# Patient Record
Sex: Female | Born: 1945
Health system: Southern US, Community
[De-identification: ages and names within clinical notes are randomized; demographics above are authoritative.]

## PROBLEM LIST (undated history)

## (undated) ENCOUNTER — Emergency Department (HOSPITAL_COMMUNITY): Admission: EM | Disposition: A | Discharge status: Home / Self Care | Payer: Self-pay

## (undated) DIAGNOSIS — E079 Disorder of thyroid, unspecified: Secondary | ICD-10-CM

## (undated) DIAGNOSIS — I10 Essential (primary) hypertension: Secondary | ICD-10-CM

## (undated) DIAGNOSIS — R011 Cardiac murmur, unspecified: Secondary | ICD-10-CM

## (undated) DIAGNOSIS — C801 Malignant (primary) neoplasm, unspecified: Secondary | ICD-10-CM

## (undated) DIAGNOSIS — E039 Hypothyroidism, unspecified: Secondary | ICD-10-CM

## (undated) DIAGNOSIS — J449 Chronic obstructive pulmonary disease, unspecified: Secondary | ICD-10-CM

## (undated) DIAGNOSIS — F419 Anxiety disorder, unspecified: Secondary | ICD-10-CM

## (undated) DIAGNOSIS — Z8742 Personal history of other diseases of the female genital tract: Secondary | ICD-10-CM

## (undated) DIAGNOSIS — F32A Depression, unspecified: Secondary | ICD-10-CM

## (undated) DIAGNOSIS — K279 Peptic ulcer, site unspecified, unspecified as acute or chronic, without hemorrhage or perforation: Secondary | ICD-10-CM

## (undated) DIAGNOSIS — F429 Obsessive-compulsive disorder, unspecified: Secondary | ICD-10-CM

## (undated) DIAGNOSIS — K219 Gastro-esophageal reflux disease without esophagitis: Secondary | ICD-10-CM

## (undated) DIAGNOSIS — M810 Age-related osteoporosis without current pathological fracture: Secondary | ICD-10-CM

## (undated) DIAGNOSIS — K5730 Diverticulosis of large intestine without perforation or abscess without bleeding: Secondary | ICD-10-CM

## (undated) DIAGNOSIS — M1990 Unspecified osteoarthritis, unspecified site: Secondary | ICD-10-CM

## (undated) DIAGNOSIS — F329 Major depressive disorder, single episode, unspecified: Secondary | ICD-10-CM

## (undated) HISTORY — DX: Disorder of thyroid, unspecified: E07.9

## (undated) HISTORY — DX: Gastro-esophageal reflux disease without esophagitis: K21.9

## (undated) HISTORY — DX: Obsessive-compulsive disorder, unspecified: F42.9

## (undated) HISTORY — PX: EYE SURGERY: SHX253

## (undated) HISTORY — PX: ABDOMINAL HYSTERECTOMY: SHX81

## (undated) HISTORY — DX: Anxiety disorder, unspecified: F41.9

## (undated) HISTORY — PX: BLADDER SUSPENSION: SHX72

## (undated) HISTORY — DX: Personal history of other diseases of the female genital tract: Z87.42

## (undated) HISTORY — DX: Major depressive disorder, single episode, unspecified: F32.9

## (undated) HISTORY — DX: Chronic obstructive pulmonary disease, unspecified: J44.9

## (undated) HISTORY — DX: Diverticulosis of large intestine without perforation or abscess without bleeding: K57.30

## (undated) HISTORY — DX: Depression, unspecified: F32.A

## (undated) HISTORY — PX: CATARACT EXTRACTION: SUR2

## (undated) HISTORY — DX: Peptic ulcer, site unspecified, unspecified as acute or chronic, without hemorrhage or perforation: K27.9

---

## 1997-09-19 ENCOUNTER — Encounter: Admission: RE | Admit: 1997-09-19 | Discharge: 1997-09-19 | Payer: Self-pay | Admitting: Internal Medicine

## 1998-03-11 ENCOUNTER — Encounter: Admission: RE | Admit: 1998-03-11 | Discharge: 1998-03-11 | Payer: Self-pay | Admitting: Internal Medicine

## 1998-03-22 ENCOUNTER — Emergency Department (HOSPITAL_COMMUNITY): Admission: EM | Admit: 1998-03-22 | Discharge: 1998-03-22 | Payer: Self-pay | Admitting: Emergency Medicine

## 1998-05-14 ENCOUNTER — Encounter: Payer: Self-pay | Admitting: Emergency Medicine

## 1998-05-14 ENCOUNTER — Emergency Department (HOSPITAL_COMMUNITY): Admission: EM | Admit: 1998-05-14 | Discharge: 1998-05-14 | Payer: Self-pay | Admitting: Emergency Medicine

## 1998-05-15 ENCOUNTER — Emergency Department (HOSPITAL_COMMUNITY): Admission: EM | Admit: 1998-05-15 | Discharge: 1998-05-15 | Payer: Self-pay | Admitting: Emergency Medicine

## 1998-05-15 ENCOUNTER — Encounter: Admission: RE | Admit: 1998-05-15 | Discharge: 1998-05-15 | Payer: Self-pay | Admitting: Internal Medicine

## 1998-05-22 ENCOUNTER — Encounter: Admission: RE | Admit: 1998-05-22 | Discharge: 1998-05-22 | Payer: Self-pay | Admitting: Hematology and Oncology

## 1998-05-22 ENCOUNTER — Ambulatory Visit (HOSPITAL_COMMUNITY): Admission: RE | Admit: 1998-05-22 | Discharge: 1998-05-22 | Payer: Self-pay | Admitting: Hematology and Oncology

## 1998-06-04 ENCOUNTER — Encounter: Admission: RE | Admit: 1998-06-04 | Discharge: 1998-09-02 | Payer: Self-pay | Admitting: Family Medicine

## 1998-06-13 ENCOUNTER — Encounter: Admission: RE | Admit: 1998-06-13 | Discharge: 1998-06-13 | Payer: Self-pay | Admitting: Internal Medicine

## 1998-06-19 ENCOUNTER — Encounter: Admission: RE | Admit: 1998-06-19 | Discharge: 1998-06-19 | Payer: Self-pay | Admitting: Hematology and Oncology

## 1998-06-23 ENCOUNTER — Encounter: Admission: RE | Admit: 1998-06-23 | Discharge: 1998-07-31 | Payer: Self-pay

## 1998-06-24 ENCOUNTER — Ambulatory Visit (HOSPITAL_COMMUNITY): Admission: RE | Admit: 1998-06-24 | Discharge: 1998-06-24 | Payer: Self-pay | Admitting: *Deleted

## 1998-06-27 ENCOUNTER — Encounter: Admission: RE | Admit: 1998-06-27 | Discharge: 1998-06-27 | Payer: Self-pay | Admitting: Internal Medicine

## 1998-07-14 ENCOUNTER — Inpatient Hospital Stay (HOSPITAL_COMMUNITY): Admission: EM | Admit: 1998-07-14 | Discharge: 1998-07-14 | Payer: Self-pay | Admitting: Obstetrics & Gynecology

## 1998-07-24 ENCOUNTER — Other Ambulatory Visit: Admission: RE | Admit: 1998-07-24 | Discharge: 1998-07-24 | Payer: Self-pay | Admitting: Obstetrics

## 1998-07-24 ENCOUNTER — Encounter: Admission: RE | Admit: 1998-07-24 | Discharge: 1998-07-24 | Payer: Self-pay | Admitting: Obstetrics

## 1998-09-04 ENCOUNTER — Emergency Department (HOSPITAL_COMMUNITY): Admission: EM | Admit: 1998-09-04 | Discharge: 1998-09-04 | Payer: Self-pay | Admitting: Emergency Medicine

## 1998-09-16 ENCOUNTER — Encounter: Admission: RE | Admit: 1998-09-16 | Discharge: 1998-09-16 | Payer: Self-pay | Admitting: Hematology and Oncology

## 1998-11-03 ENCOUNTER — Emergency Department (HOSPITAL_COMMUNITY): Admission: EM | Admit: 1998-11-03 | Discharge: 1998-11-03 | Payer: Self-pay | Admitting: Emergency Medicine

## 1998-11-07 ENCOUNTER — Ambulatory Visit (HOSPITAL_COMMUNITY): Admission: RE | Admit: 1998-11-07 | Discharge: 1998-11-07 | Payer: Self-pay | Admitting: Family Medicine

## 1998-11-07 ENCOUNTER — Encounter: Payer: Self-pay | Admitting: *Deleted

## 1998-11-08 ENCOUNTER — Emergency Department (HOSPITAL_COMMUNITY): Admission: EM | Admit: 1998-11-08 | Discharge: 1998-11-08 | Payer: Self-pay | Admitting: Emergency Medicine

## 1998-11-09 ENCOUNTER — Emergency Department (HOSPITAL_COMMUNITY): Admission: EM | Admit: 1998-11-09 | Discharge: 1998-11-10 | Payer: Self-pay | Admitting: Emergency Medicine

## 1998-11-13 ENCOUNTER — Encounter (HOSPITAL_COMMUNITY): Admission: RE | Admit: 1998-11-13 | Discharge: 1998-11-17 | Payer: Self-pay | Admitting: Emergency Medicine

## 1998-11-24 ENCOUNTER — Encounter: Admission: RE | Admit: 1998-11-24 | Discharge: 1998-12-09 | Payer: Self-pay | Admitting: Emergency Medicine

## 1999-02-27 ENCOUNTER — Emergency Department (HOSPITAL_COMMUNITY): Admission: EM | Admit: 1999-02-27 | Discharge: 1999-02-27 | Payer: Self-pay | Admitting: Internal Medicine

## 1999-03-02 ENCOUNTER — Emergency Department (HOSPITAL_COMMUNITY): Admission: EM | Admit: 1999-03-02 | Discharge: 1999-03-02 | Payer: Self-pay | Admitting: Emergency Medicine

## 1999-04-13 ENCOUNTER — Emergency Department (HOSPITAL_COMMUNITY): Admission: EM | Admit: 1999-04-13 | Discharge: 1999-04-13 | Payer: Self-pay | Admitting: Emergency Medicine

## 1999-04-16 ENCOUNTER — Encounter: Admission: RE | Admit: 1999-04-16 | Discharge: 1999-04-16 | Payer: Self-pay | Admitting: Hematology and Oncology

## 1999-08-10 ENCOUNTER — Encounter: Admission: RE | Admit: 1999-08-10 | Discharge: 1999-08-10 | Payer: Self-pay | Admitting: Internal Medicine

## 1999-09-29 ENCOUNTER — Encounter: Admission: RE | Admit: 1999-09-29 | Discharge: 1999-09-29 | Payer: Self-pay | Admitting: Family Medicine

## 1999-09-29 ENCOUNTER — Encounter: Payer: Self-pay | Admitting: Family Medicine

## 1999-12-13 ENCOUNTER — Emergency Department (HOSPITAL_COMMUNITY): Admission: EM | Admit: 1999-12-13 | Discharge: 1999-12-13 | Payer: Self-pay | Admitting: Emergency Medicine

## 2000-01-22 ENCOUNTER — Other Ambulatory Visit: Admission: RE | Admit: 2000-01-22 | Discharge: 2000-01-22 | Payer: Self-pay | Admitting: Internal Medicine

## 2000-07-05 ENCOUNTER — Emergency Department (HOSPITAL_COMMUNITY): Admission: EM | Admit: 2000-07-05 | Discharge: 2000-07-05 | Payer: Self-pay | Admitting: Emergency Medicine

## 2000-08-04 ENCOUNTER — Emergency Department (HOSPITAL_COMMUNITY): Admission: EM | Admit: 2000-08-04 | Discharge: 2000-08-04 | Payer: Self-pay | Admitting: Internal Medicine

## 2000-10-01 ENCOUNTER — Emergency Department (HOSPITAL_COMMUNITY): Admission: EM | Admit: 2000-10-01 | Discharge: 2000-10-01 | Payer: Self-pay | Admitting: Emergency Medicine

## 2001-02-27 ENCOUNTER — Ambulatory Visit (HOSPITAL_COMMUNITY): Admission: RE | Admit: 2001-02-27 | Discharge: 2001-02-27 | Payer: Self-pay | Admitting: Internal Medicine

## 2001-02-27 ENCOUNTER — Encounter: Payer: Self-pay | Admitting: Internal Medicine

## 2001-10-13 ENCOUNTER — Encounter: Admission: RE | Admit: 2001-10-13 | Discharge: 2001-10-13 | Payer: Self-pay | Admitting: Internal Medicine

## 2001-10-13 ENCOUNTER — Encounter: Payer: Self-pay | Admitting: Internal Medicine

## 2001-11-01 ENCOUNTER — Encounter: Payer: Self-pay | Admitting: Internal Medicine

## 2001-11-01 ENCOUNTER — Encounter: Admission: RE | Admit: 2001-11-01 | Discharge: 2001-11-01 | Payer: Self-pay | Admitting: Internal Medicine

## 2002-04-16 ENCOUNTER — Emergency Department (HOSPITAL_COMMUNITY): Admission: EM | Admit: 2002-04-16 | Discharge: 2002-04-16 | Payer: Self-pay | Admitting: Emergency Medicine

## 2002-04-23 ENCOUNTER — Other Ambulatory Visit: Admission: RE | Admit: 2002-04-23 | Discharge: 2002-04-23 | Payer: Self-pay | Admitting: Obstetrics and Gynecology

## 2002-04-26 ENCOUNTER — Encounter: Admission: RE | Admit: 2002-04-26 | Discharge: 2002-07-25 | Payer: Self-pay | Admitting: Family Medicine

## 2002-11-27 ENCOUNTER — Encounter: Payer: Self-pay | Admitting: Obstetrics and Gynecology

## 2002-11-27 ENCOUNTER — Encounter: Admission: RE | Admit: 2002-11-27 | Discharge: 2002-11-27 | Payer: Self-pay | Admitting: Obstetrics and Gynecology

## 2002-11-28 ENCOUNTER — Encounter: Payer: Self-pay | Admitting: Obstetrics and Gynecology

## 2002-11-28 ENCOUNTER — Ambulatory Visit (HOSPITAL_COMMUNITY): Admission: RE | Admit: 2002-11-28 | Discharge: 2002-11-28 | Payer: Self-pay | Admitting: Obstetrics and Gynecology

## 2003-01-16 ENCOUNTER — Encounter (INDEPENDENT_AMBULATORY_CARE_PROVIDER_SITE_OTHER): Payer: Self-pay | Admitting: Specialist

## 2003-01-16 ENCOUNTER — Ambulatory Visit (HOSPITAL_COMMUNITY): Admission: RE | Admit: 2003-01-16 | Discharge: 2003-01-16 | Payer: Self-pay | Admitting: Gastroenterology

## 2003-07-26 ENCOUNTER — Inpatient Hospital Stay (HOSPITAL_COMMUNITY): Admission: RE | Admit: 2003-07-26 | Discharge: 2003-07-29 | Payer: Self-pay | Admitting: Psychiatry

## 2003-07-26 ENCOUNTER — Emergency Department (HOSPITAL_COMMUNITY): Admission: EM | Admit: 2003-07-26 | Discharge: 2003-07-26 | Payer: Self-pay | Admitting: Emergency Medicine

## 2003-08-04 ENCOUNTER — Emergency Department (HOSPITAL_COMMUNITY): Admission: EM | Admit: 2003-08-04 | Discharge: 2003-08-04 | Payer: Self-pay | Admitting: *Deleted

## 2003-08-17 ENCOUNTER — Emergency Department (HOSPITAL_COMMUNITY): Admission: EM | Admit: 2003-08-17 | Discharge: 2003-08-17 | Payer: Self-pay | Admitting: Emergency Medicine

## 2003-11-06 ENCOUNTER — Ambulatory Visit (HOSPITAL_COMMUNITY): Admission: RE | Admit: 2003-11-06 | Discharge: 2003-11-06 | Payer: Self-pay | Admitting: Ophthalmology

## 2003-11-13 ENCOUNTER — Encounter: Admission: RE | Admit: 2003-11-13 | Discharge: 2003-11-13 | Payer: Self-pay | Admitting: Family Medicine

## 2003-12-21 ENCOUNTER — Emergency Department (HOSPITAL_COMMUNITY): Admission: EM | Admit: 2003-12-21 | Discharge: 2003-12-21 | Payer: Self-pay | Admitting: Family Medicine

## 2004-01-12 ENCOUNTER — Emergency Department (HOSPITAL_COMMUNITY): Admission: EM | Admit: 2004-01-12 | Discharge: 2004-01-12 | Payer: Self-pay | Admitting: *Deleted

## 2004-01-21 ENCOUNTER — Ambulatory Visit (HOSPITAL_COMMUNITY): Admission: RE | Admit: 2004-01-21 | Discharge: 2004-01-21 | Payer: Self-pay | Admitting: Dentistry

## 2004-01-21 ENCOUNTER — Encounter (INDEPENDENT_AMBULATORY_CARE_PROVIDER_SITE_OTHER): Payer: Self-pay | Admitting: Specialist

## 2004-02-08 ENCOUNTER — Emergency Department (HOSPITAL_COMMUNITY): Admission: EM | Admit: 2004-02-08 | Discharge: 2004-02-08 | Payer: Self-pay | Admitting: Emergency Medicine

## 2004-02-09 ENCOUNTER — Emergency Department (HOSPITAL_COMMUNITY): Admission: EM | Admit: 2004-02-09 | Discharge: 2004-02-09 | Payer: Self-pay | Admitting: Emergency Medicine

## 2004-08-01 ENCOUNTER — Emergency Department (HOSPITAL_COMMUNITY): Admission: EM | Admit: 2004-08-01 | Discharge: 2004-08-01 | Payer: Self-pay | Admitting: Emergency Medicine

## 2004-09-16 ENCOUNTER — Ambulatory Visit: Payer: Self-pay

## 2004-09-17 ENCOUNTER — Encounter: Admission: RE | Admit: 2004-09-17 | Discharge: 2004-09-17 | Payer: Self-pay | Admitting: Sports Medicine

## 2004-09-17 ENCOUNTER — Encounter: Payer: Self-pay | Admitting: Family Medicine

## 2004-10-03 DIAGNOSIS — Z8742 Personal history of other diseases of the female genital tract: Secondary | ICD-10-CM

## 2004-10-03 HISTORY — PX: TONSILLECTOMY AND ADENOIDECTOMY: SUR1326

## 2004-10-03 HISTORY — DX: Personal history of other diseases of the female genital tract: Z87.42

## 2004-10-22 ENCOUNTER — Ambulatory Visit: Payer: Self-pay | Admitting: Family Medicine

## 2004-10-30 ENCOUNTER — Ambulatory Visit: Payer: Self-pay | Admitting: Family Medicine

## 2005-01-21 ENCOUNTER — Ambulatory Visit: Payer: Self-pay | Admitting: Family Medicine

## 2005-02-04 ENCOUNTER — Encounter: Admission: RE | Admit: 2005-02-04 | Discharge: 2005-02-04 | Payer: Self-pay | Admitting: Sports Medicine

## 2005-02-17 ENCOUNTER — Emergency Department (HOSPITAL_COMMUNITY): Admission: EM | Admit: 2005-02-17 | Discharge: 2005-02-17 | Payer: Self-pay | Admitting: Emergency Medicine

## 2005-02-21 ENCOUNTER — Emergency Department (HOSPITAL_COMMUNITY): Admission: EM | Admit: 2005-02-21 | Discharge: 2005-02-21 | Payer: Self-pay | Admitting: Family Medicine

## 2005-02-28 ENCOUNTER — Emergency Department (HOSPITAL_COMMUNITY): Admission: EM | Admit: 2005-02-28 | Discharge: 2005-02-28 | Payer: Self-pay | Admitting: Family Medicine

## 2005-03-15 ENCOUNTER — Ambulatory Visit: Payer: Self-pay | Admitting: Sports Medicine

## 2005-04-16 ENCOUNTER — Emergency Department (HOSPITAL_COMMUNITY): Admission: EM | Admit: 2005-04-16 | Discharge: 2005-04-16 | Payer: Self-pay | Admitting: Family Medicine

## 2005-05-31 ENCOUNTER — Emergency Department (HOSPITAL_COMMUNITY): Admission: EM | Admit: 2005-05-31 | Discharge: 2005-05-31 | Payer: Self-pay | Admitting: Emergency Medicine

## 2005-06-03 HISTORY — PX: COLONOSCOPY: SHX174

## 2005-06-06 ENCOUNTER — Emergency Department (HOSPITAL_COMMUNITY): Admission: AD | Admit: 2005-06-06 | Discharge: 2005-06-06 | Payer: Self-pay | Admitting: Family Medicine

## 2005-06-07 ENCOUNTER — Ambulatory Visit: Payer: Self-pay | Admitting: Sports Medicine

## 2005-06-08 ENCOUNTER — Ambulatory Visit: Payer: Self-pay | Admitting: Family Medicine

## 2005-07-04 ENCOUNTER — Emergency Department (HOSPITAL_COMMUNITY): Admission: EM | Admit: 2005-07-04 | Discharge: 2005-07-04 | Payer: Self-pay | Admitting: Family Medicine

## 2005-07-09 ENCOUNTER — Ambulatory Visit: Payer: Self-pay | Admitting: Family Medicine

## 2005-07-16 ENCOUNTER — Ambulatory Visit: Payer: Self-pay | Admitting: Sports Medicine

## 2005-07-19 ENCOUNTER — Ambulatory Visit: Payer: Self-pay | Admitting: Sports Medicine

## 2005-07-23 ENCOUNTER — Encounter: Admission: RE | Admit: 2005-07-23 | Discharge: 2005-07-23 | Payer: Self-pay | Admitting: Sports Medicine

## 2005-07-23 ENCOUNTER — Ambulatory Visit: Payer: Self-pay | Admitting: Family Medicine

## 2005-07-26 ENCOUNTER — Emergency Department (HOSPITAL_COMMUNITY): Admission: EM | Admit: 2005-07-26 | Discharge: 2005-07-26 | Payer: Self-pay | Admitting: Emergency Medicine

## 2005-07-28 ENCOUNTER — Encounter: Admission: RE | Admit: 2005-07-28 | Discharge: 2005-07-28 | Payer: Self-pay | Admitting: Obstetrics and Gynecology

## 2005-07-30 ENCOUNTER — Ambulatory Visit: Payer: Self-pay | Admitting: Family Medicine

## 2005-09-10 ENCOUNTER — Emergency Department (HOSPITAL_COMMUNITY): Admission: EM | Admit: 2005-09-10 | Discharge: 2005-09-10 | Payer: Self-pay | Admitting: Family Medicine

## 2005-09-15 ENCOUNTER — Encounter (INDEPENDENT_AMBULATORY_CARE_PROVIDER_SITE_OTHER): Payer: Self-pay | Admitting: *Deleted

## 2005-09-16 ENCOUNTER — Inpatient Hospital Stay (HOSPITAL_COMMUNITY): Admission: RE | Admit: 2005-09-16 | Discharge: 2005-09-18 | Payer: Self-pay | Admitting: Obstetrics and Gynecology

## 2005-09-18 ENCOUNTER — Inpatient Hospital Stay (HOSPITAL_COMMUNITY): Admission: AD | Admit: 2005-09-18 | Discharge: 2005-09-18 | Payer: Self-pay | Admitting: Obstetrics and Gynecology

## 2005-09-24 ENCOUNTER — Ambulatory Visit: Payer: Self-pay | Admitting: Family Medicine

## 2005-09-28 ENCOUNTER — Emergency Department (HOSPITAL_COMMUNITY): Admission: EM | Admit: 2005-09-28 | Discharge: 2005-09-28 | Payer: Self-pay | Admitting: Family Medicine

## 2005-10-09 ENCOUNTER — Inpatient Hospital Stay (HOSPITAL_COMMUNITY): Admission: AD | Admit: 2005-10-09 | Discharge: 2005-10-09 | Payer: Self-pay | Admitting: Obstetrics and Gynecology

## 2005-10-10 ENCOUNTER — Emergency Department (HOSPITAL_COMMUNITY): Admission: EM | Admit: 2005-10-10 | Discharge: 2005-10-10 | Payer: Self-pay | Admitting: Emergency Medicine

## 2005-10-12 ENCOUNTER — Emergency Department (HOSPITAL_COMMUNITY): Admission: EM | Admit: 2005-10-12 | Discharge: 2005-10-12 | Payer: Self-pay | Admitting: Family Medicine

## 2005-10-21 ENCOUNTER — Ambulatory Visit: Payer: Self-pay | Admitting: Family Medicine

## 2005-11-13 ENCOUNTER — Emergency Department (HOSPITAL_COMMUNITY): Admission: EM | Admit: 2005-11-13 | Discharge: 2005-11-13 | Payer: Self-pay | Admitting: Family Medicine

## 2005-11-18 ENCOUNTER — Ambulatory Visit: Payer: Self-pay | Admitting: Family Medicine

## 2005-12-06 ENCOUNTER — Emergency Department (HOSPITAL_COMMUNITY): Admission: EM | Admit: 2005-12-06 | Discharge: 2005-12-06 | Payer: Self-pay | Admitting: Emergency Medicine

## 2005-12-09 ENCOUNTER — Ambulatory Visit (HOSPITAL_BASED_OUTPATIENT_CLINIC_OR_DEPARTMENT_OTHER): Admission: RE | Admit: 2005-12-09 | Discharge: 2005-12-09 | Payer: Self-pay | Admitting: Orthopedic Surgery

## 2005-12-20 ENCOUNTER — Ambulatory Visit: Payer: Self-pay | Admitting: Sports Medicine

## 2006-01-03 ENCOUNTER — Encounter: Admission: RE | Admit: 2006-01-03 | Discharge: 2006-01-03 | Payer: Self-pay | Admitting: Sports Medicine

## 2006-01-14 ENCOUNTER — Emergency Department (HOSPITAL_COMMUNITY): Admission: EM | Admit: 2006-01-14 | Discharge: 2006-01-14 | Payer: Self-pay | Admitting: Family Medicine

## 2006-01-17 ENCOUNTER — Ambulatory Visit: Payer: Self-pay | Admitting: Sports Medicine

## 2006-01-29 ENCOUNTER — Emergency Department (HOSPITAL_COMMUNITY): Admission: EM | Admit: 2006-01-29 | Discharge: 2006-01-29 | Payer: Self-pay | Admitting: Family Medicine

## 2006-01-31 ENCOUNTER — Emergency Department (HOSPITAL_COMMUNITY): Admission: EM | Admit: 2006-01-31 | Discharge: 2006-01-31 | Payer: Self-pay | Admitting: Emergency Medicine

## 2006-02-02 ENCOUNTER — Ambulatory Visit: Payer: Self-pay | Admitting: Family Medicine

## 2006-02-10 ENCOUNTER — Ambulatory Visit: Payer: Self-pay | Admitting: Sports Medicine

## 2006-03-09 ENCOUNTER — Emergency Department (HOSPITAL_COMMUNITY): Admission: EM | Admit: 2006-03-09 | Discharge: 2006-03-09 | Payer: Self-pay | Admitting: Family Medicine

## 2006-03-09 ENCOUNTER — Ambulatory Visit: Payer: Self-pay | Admitting: Family Medicine

## 2006-04-01 ENCOUNTER — Ambulatory Visit: Payer: Self-pay | Admitting: Family Medicine

## 2006-04-29 ENCOUNTER — Ambulatory Visit: Payer: Self-pay | Admitting: Family Medicine

## 2006-06-17 ENCOUNTER — Telehealth (INDEPENDENT_AMBULATORY_CARE_PROVIDER_SITE_OTHER): Payer: Self-pay | Admitting: *Deleted

## 2006-06-22 ENCOUNTER — Ambulatory Visit: Payer: Self-pay | Admitting: Sports Medicine

## 2006-07-04 ENCOUNTER — Encounter: Admission: RE | Admit: 2006-07-04 | Discharge: 2006-10-02 | Payer: Self-pay | Admitting: Obstetrics and Gynecology

## 2006-09-04 DIAGNOSIS — K279 Peptic ulcer, site unspecified, unspecified as acute or chronic, without hemorrhage or perforation: Secondary | ICD-10-CM

## 2006-09-04 HISTORY — DX: Peptic ulcer, site unspecified, unspecified as acute or chronic, without hemorrhage or perforation: K27.9

## 2006-09-07 ENCOUNTER — Ambulatory Visit (HOSPITAL_COMMUNITY): Admission: RE | Admit: 2006-09-07 | Discharge: 2006-09-07 | Payer: Self-pay | Admitting: Obstetrics and Gynecology

## 2006-09-10 ENCOUNTER — Emergency Department (HOSPITAL_COMMUNITY): Admission: EM | Admit: 2006-09-10 | Discharge: 2006-09-10 | Payer: Self-pay | Admitting: Family Medicine

## 2006-09-24 ENCOUNTER — Emergency Department (HOSPITAL_COMMUNITY): Admission: EM | Admit: 2006-09-24 | Discharge: 2006-09-25 | Payer: Self-pay | Admitting: Emergency Medicine

## 2006-10-10 ENCOUNTER — Telehealth: Payer: Self-pay | Admitting: *Deleted

## 2006-10-13 ENCOUNTER — Encounter (INDEPENDENT_AMBULATORY_CARE_PROVIDER_SITE_OTHER): Payer: Self-pay | Admitting: Family Medicine

## 2006-10-13 ENCOUNTER — Ambulatory Visit: Payer: Self-pay | Admitting: Family Medicine

## 2006-10-13 DIAGNOSIS — M810 Age-related osteoporosis without current pathological fracture: Secondary | ICD-10-CM | POA: Insufficient documentation

## 2006-10-13 DIAGNOSIS — F39 Unspecified mood [affective] disorder: Secondary | ICD-10-CM | POA: Insufficient documentation

## 2006-10-13 DIAGNOSIS — F429 Obsessive-compulsive disorder, unspecified: Secondary | ICD-10-CM | POA: Insufficient documentation

## 2006-10-13 DIAGNOSIS — K5730 Diverticulosis of large intestine without perforation or abscess without bleeding: Secondary | ICD-10-CM | POA: Insufficient documentation

## 2006-10-13 DIAGNOSIS — K219 Gastro-esophageal reflux disease without esophagitis: Secondary | ICD-10-CM | POA: Insufficient documentation

## 2006-10-13 LAB — CONVERTED CEMR LAB
ALT: 9 units/L (ref 0–35)
AST: 13 units/L (ref 0–37)
Albumin: 4.3 g/dL (ref 3.5–5.2)
Alkaline Phosphatase: 86 units/L (ref 39–117)
BUN: 19 mg/dL (ref 6–23)
CO2: 25 meq/L (ref 19–32)
Calcium: 9.3 mg/dL (ref 8.4–10.5)
Chloride: 105 meq/L (ref 96–112)
Creatinine, Ser: 0.7 mg/dL (ref 0.40–1.20)
Glucose, Bld: 76 mg/dL (ref 70–99)
Potassium: 4.1 meq/L (ref 3.5–5.3)
Sodium: 140 meq/L (ref 135–145)
Total Bilirubin: 0.5 mg/dL (ref 0.3–1.2)
Total Protein: 6.4 g/dL (ref 6.0–8.3)

## 2006-10-20 ENCOUNTER — Ambulatory Visit (HOSPITAL_COMMUNITY): Admission: RE | Admit: 2006-10-20 | Discharge: 2006-10-20 | Payer: Self-pay | Admitting: Obstetrics and Gynecology

## 2006-10-29 ENCOUNTER — Emergency Department (HOSPITAL_COMMUNITY): Admission: EM | Admit: 2006-10-29 | Discharge: 2006-10-29 | Payer: Self-pay | Admitting: Emergency Medicine

## 2006-11-04 HISTORY — PX: OTHER SURGICAL HISTORY: SHX169

## 2006-11-06 ENCOUNTER — Emergency Department (HOSPITAL_COMMUNITY): Admission: EM | Admit: 2006-11-06 | Discharge: 2006-11-06 | Payer: Self-pay | Admitting: Emergency Medicine

## 2006-11-10 ENCOUNTER — Telehealth (INDEPENDENT_AMBULATORY_CARE_PROVIDER_SITE_OTHER): Payer: Self-pay | Admitting: *Deleted

## 2006-11-10 ENCOUNTER — Ambulatory Visit: Payer: Self-pay | Admitting: Family Medicine

## 2006-11-10 ENCOUNTER — Encounter: Admission: RE | Admit: 2006-11-10 | Discharge: 2006-11-10 | Payer: Self-pay | Admitting: Family Medicine

## 2006-11-16 ENCOUNTER — Telehealth (INDEPENDENT_AMBULATORY_CARE_PROVIDER_SITE_OTHER): Payer: Self-pay | Admitting: Family Medicine

## 2006-11-18 ENCOUNTER — Ambulatory Visit: Payer: Self-pay | Admitting: Family Medicine

## 2006-11-19 ENCOUNTER — Telehealth (INDEPENDENT_AMBULATORY_CARE_PROVIDER_SITE_OTHER): Payer: Self-pay | Admitting: Family Medicine

## 2006-11-21 ENCOUNTER — Telehealth (INDEPENDENT_AMBULATORY_CARE_PROVIDER_SITE_OTHER): Payer: Self-pay | Admitting: Family Medicine

## 2006-11-22 ENCOUNTER — Encounter (INDEPENDENT_AMBULATORY_CARE_PROVIDER_SITE_OTHER): Payer: Self-pay | Admitting: Family Medicine

## 2006-11-22 ENCOUNTER — Ambulatory Visit (HOSPITAL_COMMUNITY): Admission: RE | Admit: 2006-11-22 | Discharge: 2006-11-22 | Payer: Self-pay | Admitting: Sports Medicine

## 2006-11-25 ENCOUNTER — Telehealth (INDEPENDENT_AMBULATORY_CARE_PROVIDER_SITE_OTHER): Payer: Self-pay | Admitting: Family Medicine

## 2006-11-29 ENCOUNTER — Ambulatory Visit: Payer: Self-pay | Admitting: Family Medicine

## 2006-11-29 ENCOUNTER — Encounter (INDEPENDENT_AMBULATORY_CARE_PROVIDER_SITE_OTHER): Payer: Self-pay | Admitting: Family Medicine

## 2006-11-29 LAB — CONVERTED CEMR LAB
Cholesterol: 207 mg/dL — ABNORMAL HIGH
HDL: 56 mg/dL
LDL Cholesterol: 135 mg/dL — ABNORMAL HIGH
Total CHOL/HDL Ratio: 3.7
Triglycerides: 82 mg/dL
VLDL: 16 mg/dL

## 2006-12-02 ENCOUNTER — Telehealth (INDEPENDENT_AMBULATORY_CARE_PROVIDER_SITE_OTHER): Payer: Self-pay | Admitting: Family Medicine

## 2006-12-02 ENCOUNTER — Ambulatory Visit: Payer: Self-pay | Admitting: Family Medicine

## 2006-12-03 ENCOUNTER — Emergency Department (HOSPITAL_COMMUNITY): Admission: EM | Admit: 2006-12-03 | Discharge: 2006-12-03 | Payer: Self-pay | Admitting: Emergency Medicine

## 2006-12-20 ENCOUNTER — Telehealth (INDEPENDENT_AMBULATORY_CARE_PROVIDER_SITE_OTHER): Payer: Self-pay | Admitting: Family Medicine

## 2007-01-02 ENCOUNTER — Ambulatory Visit: Payer: Self-pay | Admitting: Family Medicine

## 2007-01-02 ENCOUNTER — Telehealth (INDEPENDENT_AMBULATORY_CARE_PROVIDER_SITE_OTHER): Payer: Self-pay | Admitting: Family Medicine

## 2007-01-04 ENCOUNTER — Emergency Department (HOSPITAL_COMMUNITY): Admission: EM | Admit: 2007-01-04 | Discharge: 2007-01-04 | Payer: Self-pay | Admitting: Family Medicine

## 2007-01-25 ENCOUNTER — Ambulatory Visit: Payer: Self-pay | Admitting: Family Medicine

## 2007-01-30 ENCOUNTER — Telehealth (INDEPENDENT_AMBULATORY_CARE_PROVIDER_SITE_OTHER): Payer: Self-pay | Admitting: Family Medicine

## 2007-02-01 ENCOUNTER — Encounter: Payer: Self-pay | Admitting: *Deleted

## 2007-03-13 ENCOUNTER — Encounter: Payer: Self-pay | Admitting: *Deleted

## 2007-04-12 ENCOUNTER — Encounter: Payer: Self-pay | Admitting: Family Medicine

## 2007-06-14 ENCOUNTER — Emergency Department (HOSPITAL_COMMUNITY): Admission: EM | Admit: 2007-06-14 | Discharge: 2007-06-14 | Payer: Self-pay | Admitting: Family Medicine

## 2007-07-10 ENCOUNTER — Telehealth: Payer: Self-pay | Admitting: Family Medicine

## 2007-07-14 ENCOUNTER — Emergency Department (HOSPITAL_COMMUNITY): Admission: EM | Admit: 2007-07-14 | Discharge: 2007-07-14 | Payer: Self-pay | Admitting: Emergency Medicine

## 2007-08-01 ENCOUNTER — Encounter: Admission: RE | Admit: 2007-08-01 | Discharge: 2007-08-22 | Payer: Self-pay | Admitting: Family Medicine

## 2007-08-01 ENCOUNTER — Ambulatory Visit: Payer: Self-pay | Admitting: Family Medicine

## 2007-08-21 ENCOUNTER — Emergency Department (HOSPITAL_COMMUNITY): Admission: EM | Admit: 2007-08-21 | Discharge: 2007-08-21 | Payer: Self-pay | Admitting: Emergency Medicine

## 2007-10-05 ENCOUNTER — Ambulatory Visit (HOSPITAL_COMMUNITY): Admission: RE | Admit: 2007-10-05 | Discharge: 2007-10-05 | Payer: Self-pay | Admitting: Family Medicine

## 2007-10-05 ENCOUNTER — Ambulatory Visit: Payer: Self-pay | Admitting: Family Medicine

## 2007-10-09 ENCOUNTER — Encounter: Payer: Self-pay | Admitting: Family Medicine

## 2007-10-12 ENCOUNTER — Telehealth (INDEPENDENT_AMBULATORY_CARE_PROVIDER_SITE_OTHER): Payer: Self-pay | Admitting: *Deleted

## 2007-10-12 ENCOUNTER — Ambulatory Visit: Payer: Self-pay | Admitting: Family Medicine

## 2007-10-16 ENCOUNTER — Encounter: Payer: Self-pay | Admitting: *Deleted

## 2007-10-23 ENCOUNTER — Ambulatory Visit (HOSPITAL_COMMUNITY): Admission: RE | Admit: 2007-10-23 | Discharge: 2007-10-23 | Payer: Self-pay | Admitting: Obstetrics and Gynecology

## 2007-11-03 ENCOUNTER — Ambulatory Visit: Payer: Self-pay | Admitting: Family Medicine

## 2007-11-07 ENCOUNTER — Telehealth: Payer: Self-pay | Admitting: Psychology

## 2007-11-16 ENCOUNTER — Encounter: Payer: Self-pay | Admitting: Family Medicine

## 2007-11-16 ENCOUNTER — Ambulatory Visit: Payer: Self-pay | Admitting: Family Medicine

## 2007-11-28 ENCOUNTER — Encounter: Admission: RE | Admit: 2007-11-28 | Discharge: 2007-11-28 | Payer: Self-pay | Admitting: Otolaryngology

## 2007-12-19 ENCOUNTER — Encounter: Admission: RE | Admit: 2007-12-19 | Discharge: 2007-12-19 | Payer: Self-pay | Admitting: Family Medicine

## 2007-12-26 ENCOUNTER — Encounter: Payer: Self-pay | Admitting: Family Medicine

## 2007-12-27 ENCOUNTER — Encounter: Payer: Self-pay | Admitting: Family Medicine

## 2007-12-27 LAB — CONVERTED CEMR LAB
HCT: 38.8 %
Helicobacter Pylori Antibody-IgG: 0.7
Hemoglobin: 12.7 g/dL
MCHC: 32.7 g/dL
MCV: 97.5 fL
Platelets: 281 10*3/uL
RBC: 3.98 M/uL
RDW: 12.9 %
WBC: 5.5 10*3/uL

## 2007-12-28 ENCOUNTER — Encounter: Payer: Self-pay | Admitting: Family Medicine

## 2008-01-08 ENCOUNTER — Telehealth (INDEPENDENT_AMBULATORY_CARE_PROVIDER_SITE_OTHER): Payer: Self-pay | Admitting: *Deleted

## 2008-01-23 ENCOUNTER — Ambulatory Visit: Payer: Self-pay | Admitting: Family Medicine

## 2008-01-23 ENCOUNTER — Telehealth (INDEPENDENT_AMBULATORY_CARE_PROVIDER_SITE_OTHER): Payer: Self-pay | Admitting: *Deleted

## 2008-01-29 ENCOUNTER — Encounter: Payer: Self-pay | Admitting: Family Medicine

## 2008-03-05 ENCOUNTER — Telehealth: Payer: Self-pay | Admitting: *Deleted

## 2008-04-27 ENCOUNTER — Emergency Department (HOSPITAL_COMMUNITY): Admission: EM | Admit: 2008-04-27 | Discharge: 2008-04-27 | Payer: Self-pay | Admitting: Emergency Medicine

## 2008-05-01 ENCOUNTER — Telehealth: Payer: Self-pay | Admitting: *Deleted

## 2008-05-02 ENCOUNTER — Ambulatory Visit: Payer: Self-pay | Admitting: Family Medicine

## 2008-05-06 ENCOUNTER — Encounter: Payer: Self-pay | Admitting: Family Medicine

## 2008-05-06 ENCOUNTER — Ambulatory Visit: Payer: Self-pay | Admitting: Family Medicine

## 2008-05-06 DIAGNOSIS — E039 Hypothyroidism, unspecified: Secondary | ICD-10-CM | POA: Insufficient documentation

## 2008-05-06 LAB — CONVERTED CEMR LAB: TSH: 6.773 microintl units/mL — ABNORMAL HIGH (ref 0.350–4.50)

## 2008-05-13 ENCOUNTER — Ambulatory Visit: Payer: Self-pay | Admitting: Family Medicine

## 2008-05-13 ENCOUNTER — Telehealth: Payer: Self-pay | Admitting: Family Medicine

## 2008-05-13 ENCOUNTER — Ambulatory Visit (HOSPITAL_COMMUNITY): Admission: RE | Admit: 2008-05-13 | Discharge: 2008-05-13 | Payer: Self-pay | Admitting: Sports Medicine

## 2008-05-14 ENCOUNTER — Telehealth (INDEPENDENT_AMBULATORY_CARE_PROVIDER_SITE_OTHER): Payer: Self-pay | Admitting: *Deleted

## 2008-05-15 ENCOUNTER — Ambulatory Visit: Payer: Self-pay | Admitting: Family Medicine

## 2008-05-23 ENCOUNTER — Encounter: Admission: RE | Admit: 2008-05-23 | Discharge: 2008-05-23 | Payer: Self-pay | Admitting: Gastroenterology

## 2008-05-23 ENCOUNTER — Encounter: Payer: Self-pay | Admitting: Family Medicine

## 2008-06-17 ENCOUNTER — Encounter: Admission: RE | Admit: 2008-06-17 | Discharge: 2008-06-17 | Payer: Self-pay | Admitting: Internal Medicine

## 2008-06-28 ENCOUNTER — Emergency Department (HOSPITAL_COMMUNITY): Admission: EM | Admit: 2008-06-28 | Discharge: 2008-06-28 | Payer: Self-pay | Admitting: Family Medicine

## 2008-07-02 ENCOUNTER — Encounter: Payer: Self-pay | Admitting: Family Medicine

## 2008-07-02 LAB — CONVERTED CEMR LAB
ALT: 8 units/L
AST: 12 units/L
Albumin: 4.3 g/dL
Alkaline Phosphatase: 96 units/L
BUN: 16 mg/dL
Bilirubin, Direct: 0.1 mg/dL
CO2: 23 meq/L
Chloride: 107 meq/L
Creatinine, Ser: 0.75 mg/dL
Glucose, Bld: 83 mg/dL
HCT: 42.8 %
Hemoglobin: 13.5 g/dL
Indirect Bilirubin: 0.4 mg/dL
MCHC: 31.5 g/dL
MCV: 97.3 fL
Platelets: 277 10*3/uL
Potassium: 4 meq/L
RBC: 4.4 M/uL
RDW: 13.1 %
Sodium: 139 meq/L
Total Bilirubin: 0.5 mg/dL
Total Protein: 6.4 g/dL
WBC: 5.1 10*3/uL

## 2008-07-04 ENCOUNTER — Encounter: Payer: Self-pay | Admitting: Family Medicine

## 2008-07-09 ENCOUNTER — Ambulatory Visit (HOSPITAL_COMMUNITY): Admission: RE | Admit: 2008-07-09 | Discharge: 2008-07-09 | Payer: Self-pay | Admitting: Internal Medicine

## 2008-07-09 ENCOUNTER — Encounter (INDEPENDENT_AMBULATORY_CARE_PROVIDER_SITE_OTHER): Payer: Self-pay | Admitting: Interventional Radiology

## 2008-07-11 ENCOUNTER — Encounter: Payer: Self-pay | Admitting: Family Medicine

## 2008-08-03 HISTORY — PX: THYROIDECTOMY, PARTIAL: SHX18

## 2008-08-03 HISTORY — PX: BIOPSY THYROID: PRO38

## 2008-08-15 ENCOUNTER — Encounter (INDEPENDENT_AMBULATORY_CARE_PROVIDER_SITE_OTHER): Payer: Self-pay | Admitting: General Surgery

## 2008-08-15 ENCOUNTER — Ambulatory Visit (HOSPITAL_COMMUNITY): Admission: RE | Admit: 2008-08-15 | Discharge: 2008-08-16 | Payer: Self-pay | Admitting: General Surgery

## 2008-08-20 ENCOUNTER — Emergency Department (HOSPITAL_COMMUNITY): Admission: EM | Admit: 2008-08-20 | Discharge: 2008-08-20 | Payer: Self-pay | Admitting: Emergency Medicine

## 2008-09-03 ENCOUNTER — Emergency Department (HOSPITAL_COMMUNITY): Admission: EM | Admit: 2008-09-03 | Discharge: 2008-09-03 | Payer: Self-pay | Admitting: Family Medicine

## 2008-09-27 ENCOUNTER — Telehealth: Payer: Self-pay | Admitting: Family Medicine

## 2008-09-28 ENCOUNTER — Emergency Department (HOSPITAL_COMMUNITY): Admission: EM | Admit: 2008-09-28 | Discharge: 2008-09-28 | Payer: Self-pay | Admitting: Family Medicine

## 2008-09-30 ENCOUNTER — Ambulatory Visit (HOSPITAL_COMMUNITY): Admission: RE | Admit: 2008-09-30 | Discharge: 2008-09-30 | Payer: Self-pay | Admitting: Obstetrics and Gynecology

## 2008-10-01 ENCOUNTER — Ambulatory Visit: Payer: Self-pay | Admitting: Family Medicine

## 2008-10-01 ENCOUNTER — Encounter: Payer: Self-pay | Admitting: Family Medicine

## 2008-10-01 LAB — CONVERTED CEMR LAB
HCT: 40.6 % (ref 36.0–46.0)
Hemoglobin: 13.3 g/dL (ref 12.0–15.0)
MCHC: 32.8 g/dL (ref 30.0–36.0)
MCV: 98.3 fL (ref 78.0–100.0)
Platelets: 310 10*3/uL (ref 150–400)
RBC: 4.13 M/uL (ref 3.87–5.11)
RDW: 13.7 % (ref 11.5–15.5)
WBC: 6.1 10*3/uL (ref 4.0–10.5)

## 2008-10-02 ENCOUNTER — Encounter: Payer: Self-pay | Admitting: Family Medicine

## 2008-10-07 ENCOUNTER — Telehealth: Payer: Self-pay | Admitting: Family Medicine

## 2008-10-09 ENCOUNTER — Telehealth: Payer: Self-pay | Admitting: Family Medicine

## 2008-10-10 ENCOUNTER — Ambulatory Visit: Payer: Self-pay | Admitting: Family Medicine

## 2008-10-18 ENCOUNTER — Telehealth: Payer: Self-pay | Admitting: Family Medicine

## 2008-10-27 ENCOUNTER — Emergency Department (HOSPITAL_COMMUNITY): Admission: EM | Admit: 2008-10-27 | Discharge: 2008-10-28 | Payer: Self-pay | Admitting: Emergency Medicine

## 2008-11-12 ENCOUNTER — Encounter: Payer: Self-pay | Admitting: Family Medicine

## 2008-11-28 ENCOUNTER — Ambulatory Visit: Payer: Self-pay | Admitting: Family Medicine

## 2008-11-28 DIAGNOSIS — F319 Bipolar disorder, unspecified: Secondary | ICD-10-CM | POA: Insufficient documentation

## 2008-12-13 ENCOUNTER — Encounter: Payer: Self-pay | Admitting: Family Medicine

## 2008-12-13 ENCOUNTER — Ambulatory Visit: Payer: Self-pay | Admitting: Family Medicine

## 2008-12-13 LAB — CONVERTED CEMR LAB: TSH: 3.86 microintl units/mL (ref 0.350–4.500)

## 2008-12-16 ENCOUNTER — Encounter: Payer: Self-pay | Admitting: Family Medicine

## 2008-12-25 ENCOUNTER — Telehealth: Payer: Self-pay | Admitting: Family Medicine

## 2008-12-27 ENCOUNTER — Emergency Department (HOSPITAL_COMMUNITY): Admission: EM | Admit: 2008-12-27 | Discharge: 2008-12-27 | Payer: Self-pay | Admitting: Emergency Medicine

## 2008-12-29 ENCOUNTER — Emergency Department (HOSPITAL_COMMUNITY): Admission: EM | Admit: 2008-12-29 | Discharge: 2008-12-29 | Payer: Self-pay | Admitting: Emergency Medicine

## 2009-01-21 ENCOUNTER — Telehealth: Payer: Self-pay | Admitting: *Deleted

## 2009-01-22 ENCOUNTER — Ambulatory Visit: Payer: Self-pay | Admitting: Family Medicine

## 2009-01-23 ENCOUNTER — Telehealth: Payer: Self-pay | Admitting: *Deleted

## 2009-01-27 ENCOUNTER — Encounter: Payer: Self-pay | Admitting: *Deleted

## 2009-03-13 ENCOUNTER — Ambulatory Visit: Payer: Self-pay | Admitting: Family Medicine

## 2009-03-13 ENCOUNTER — Encounter: Payer: Self-pay | Admitting: Family Medicine

## 2009-03-13 LAB — CONVERTED CEMR LAB: TSH: 1.815 microintl units/mL (ref 0.350–4.500)

## 2009-03-17 ENCOUNTER — Encounter: Payer: Self-pay | Admitting: Family Medicine

## 2009-04-03 ENCOUNTER — Ambulatory Visit: Payer: Self-pay | Admitting: Family Medicine

## 2009-04-07 ENCOUNTER — Encounter: Payer: Self-pay | Admitting: Family Medicine

## 2009-04-07 ENCOUNTER — Encounter (INDEPENDENT_AMBULATORY_CARE_PROVIDER_SITE_OTHER): Payer: Self-pay | Admitting: *Deleted

## 2009-04-07 ENCOUNTER — Encounter: Admission: RE | Admit: 2009-04-07 | Discharge: 2009-04-07 | Payer: Self-pay | Admitting: Family Medicine

## 2009-05-20 ENCOUNTER — Ambulatory Visit: Payer: Self-pay | Admitting: Family Medicine

## 2009-07-11 ENCOUNTER — Ambulatory Visit: Payer: Self-pay | Admitting: Family Medicine

## 2009-07-11 ENCOUNTER — Telehealth: Payer: Self-pay | Admitting: Family Medicine

## 2009-08-02 ENCOUNTER — Encounter: Payer: Self-pay | Admitting: Emergency Medicine

## 2009-08-02 ENCOUNTER — Encounter: Payer: Self-pay | Admitting: Family Medicine

## 2009-08-02 ENCOUNTER — Ambulatory Visit: Payer: Self-pay | Admitting: Family Medicine

## 2009-08-02 ENCOUNTER — Observation Stay (HOSPITAL_COMMUNITY): Admission: EM | Admit: 2009-08-02 | Discharge: 2009-08-03 | Payer: Self-pay | Admitting: Family Medicine

## 2009-08-02 DIAGNOSIS — R079 Chest pain, unspecified: Secondary | ICD-10-CM | POA: Insufficient documentation

## 2009-08-14 ENCOUNTER — Ambulatory Visit: Payer: Self-pay | Admitting: Family Medicine

## 2009-08-14 ENCOUNTER — Encounter: Payer: Self-pay | Admitting: Family Medicine

## 2009-08-28 ENCOUNTER — Telehealth: Payer: Self-pay | Admitting: Family Medicine

## 2009-08-28 ENCOUNTER — Encounter: Payer: Self-pay | Admitting: Family Medicine

## 2009-09-08 ENCOUNTER — Ambulatory Visit: Payer: Self-pay | Admitting: Family Medicine

## 2009-09-10 ENCOUNTER — Encounter: Payer: Self-pay | Admitting: Family Medicine

## 2009-09-10 HISTORY — PX: OTHER SURGICAL HISTORY: SHX169

## 2009-09-10 HISTORY — PX: TRANSTHORACIC ECHOCARDIOGRAM: SHX275

## 2009-09-19 ENCOUNTER — Encounter: Payer: Self-pay | Admitting: Family Medicine

## 2009-09-19 LAB — CONVERTED CEMR LAB
HDL: 57 mg/dL
LDL Cholesterol: 166 mg/dL
TSH: 91.266 microintl units/mL
Triglycerides: 154 mg/dL

## 2009-09-20 ENCOUNTER — Encounter: Payer: Self-pay | Admitting: Family Medicine

## 2009-09-20 HISTORY — PX: OTHER SURGICAL HISTORY: SHX169

## 2009-09-20 LAB — CONVERTED CEMR LAB
BUN: 13 mg/dL
CO2: 28 meq/L
Chloride: 103 meq/L
Creatinine, Ser: 0.8 mg/dL
Glucose, Bld: 90 mg/dL
Potassium: 3.2 meq/L
Sodium: 141 meq/L

## 2009-10-19 ENCOUNTER — Inpatient Hospital Stay (HOSPITAL_COMMUNITY): Admission: EM | Admit: 2009-10-19 | Discharge: 2009-10-20 | Payer: Self-pay | Admitting: Family Medicine

## 2009-10-19 ENCOUNTER — Ambulatory Visit: Payer: Self-pay | Admitting: Family Medicine

## 2009-10-19 ENCOUNTER — Encounter: Payer: Self-pay | Admitting: Family Medicine

## 2009-10-19 ENCOUNTER — Encounter: Payer: Self-pay | Admitting: Emergency Medicine

## 2009-10-20 ENCOUNTER — Encounter: Payer: Self-pay | Admitting: Family Medicine

## 2009-10-24 ENCOUNTER — Telehealth: Payer: Self-pay | Admitting: *Deleted

## 2009-10-24 ENCOUNTER — Telehealth: Payer: Self-pay | Admitting: Family Medicine

## 2009-10-25 ENCOUNTER — Telehealth: Payer: Self-pay | Admitting: Sports Medicine

## 2009-10-28 ENCOUNTER — Encounter: Payer: Self-pay | Admitting: Family Medicine

## 2009-10-30 ENCOUNTER — Encounter: Admission: RE | Admit: 2009-10-30 | Discharge: 2009-10-30 | Payer: Self-pay | Admitting: Family Medicine

## 2009-10-30 ENCOUNTER — Encounter: Payer: Self-pay | Admitting: Family Medicine

## 2009-10-30 ENCOUNTER — Ambulatory Visit: Payer: Self-pay | Admitting: Family Medicine

## 2009-10-30 DIAGNOSIS — M79609 Pain in unspecified limb: Secondary | ICD-10-CM | POA: Insufficient documentation

## 2009-10-31 ENCOUNTER — Encounter: Payer: Self-pay | Admitting: Family Medicine

## 2009-11-03 ENCOUNTER — Encounter: Payer: Self-pay | Admitting: Family Medicine

## 2009-11-05 ENCOUNTER — Ambulatory Visit: Payer: Self-pay | Admitting: Family Medicine

## 2009-11-05 DIAGNOSIS — Z716 Tobacco abuse counseling: Secondary | ICD-10-CM | POA: Insufficient documentation

## 2009-11-05 DIAGNOSIS — I6350 Cerebral infarction due to unspecified occlusion or stenosis of unspecified cerebral artery: Secondary | ICD-10-CM | POA: Insufficient documentation

## 2009-11-06 ENCOUNTER — Telehealth: Payer: Self-pay | Admitting: *Deleted

## 2009-11-26 ENCOUNTER — Emergency Department (HOSPITAL_COMMUNITY): Admission: EM | Admit: 2009-11-26 | Discharge: 2009-11-26 | Payer: Self-pay | Admitting: Family Medicine

## 2009-11-27 ENCOUNTER — Ambulatory Visit: Payer: Self-pay | Admitting: Family Medicine

## 2009-11-27 ENCOUNTER — Encounter: Payer: Self-pay | Admitting: Family Medicine

## 2009-11-28 LAB — CONVERTED CEMR LAB: TSH: 2.202 microintl units/mL (ref 0.350–4.500)

## 2009-12-01 ENCOUNTER — Telehealth: Payer: Self-pay | Admitting: Family Medicine

## 2009-12-02 ENCOUNTER — Ambulatory Visit: Payer: Self-pay | Admitting: Family Medicine

## 2009-12-03 ENCOUNTER — Telehealth: Payer: Self-pay | Admitting: Family Medicine

## 2009-12-03 ENCOUNTER — Ambulatory Visit: Payer: Self-pay | Admitting: Family Medicine

## 2009-12-03 DIAGNOSIS — J449 Chronic obstructive pulmonary disease, unspecified: Secondary | ICD-10-CM | POA: Insufficient documentation

## 2009-12-22 ENCOUNTER — Ambulatory Visit (HOSPITAL_COMMUNITY): Admission: RE | Admit: 2009-12-22 | Discharge: 2009-12-22 | Payer: Self-pay | Admitting: Family Medicine

## 2009-12-22 ENCOUNTER — Encounter: Payer: Self-pay | Admitting: Family Medicine

## 2009-12-22 ENCOUNTER — Ambulatory Visit: Payer: Self-pay | Admitting: Family Medicine

## 2009-12-22 ENCOUNTER — Telehealth: Payer: Self-pay | Admitting: Family Medicine

## 2009-12-23 ENCOUNTER — Encounter: Payer: Self-pay | Admitting: Family Medicine

## 2009-12-23 ENCOUNTER — Encounter: Admission: RE | Admit: 2009-12-23 | Discharge: 2009-12-23 | Payer: Self-pay | Admitting: Family Medicine

## 2009-12-23 DIAGNOSIS — J984 Other disorders of lung: Secondary | ICD-10-CM | POA: Insufficient documentation

## 2009-12-24 ENCOUNTER — Telehealth (INDEPENDENT_AMBULATORY_CARE_PROVIDER_SITE_OTHER): Payer: Self-pay | Admitting: *Deleted

## 2009-12-27 ENCOUNTER — Emergency Department (HOSPITAL_COMMUNITY): Admission: EM | Admit: 2009-12-27 | Discharge: 2009-12-27 | Payer: Self-pay | Admitting: Emergency Medicine

## 2010-01-08 ENCOUNTER — Emergency Department (HOSPITAL_COMMUNITY)
Admission: EM | Admit: 2010-01-08 | Discharge: 2010-01-08 | Payer: Self-pay | Source: Home / Self Care | Admitting: Emergency Medicine

## 2010-01-12 ENCOUNTER — Telehealth: Payer: Self-pay | Admitting: Family Medicine

## 2010-01-20 ENCOUNTER — Ambulatory Visit: Payer: Self-pay | Admitting: Family Medicine

## 2010-03-25 ENCOUNTER — Telehealth: Payer: Self-pay | Admitting: Family Medicine

## 2010-03-25 ENCOUNTER — Encounter: Payer: Self-pay | Admitting: Family Medicine

## 2010-03-25 ENCOUNTER — Ambulatory Visit: Payer: Self-pay | Admitting: Family Medicine

## 2010-03-25 DIAGNOSIS — E785 Hyperlipidemia, unspecified: Secondary | ICD-10-CM | POA: Insufficient documentation

## 2010-03-25 DIAGNOSIS — I10 Essential (primary) hypertension: Secondary | ICD-10-CM | POA: Insufficient documentation

## 2010-03-26 ENCOUNTER — Encounter: Payer: Self-pay | Admitting: Family Medicine

## 2010-03-26 LAB — CONVERTED CEMR LAB
BUN: 12 mg/dL (ref 6–23)
Basophils Absolute: 0 10*3/uL (ref 0.0–0.1)
Basophils Relative: 1 % (ref 0–1)
CO2: 27 meq/L (ref 19–32)
Calcium: 8.9 mg/dL (ref 8.4–10.5)
Chloride: 107 meq/L (ref 96–112)
Creatinine, Ser: 0.69 mg/dL (ref 0.40–1.20)
Eosinophils Absolute: 0.2 10*3/uL (ref 0.0–0.7)
Eosinophils Relative: 2 % (ref 0–5)
Glucose, Bld: 91 mg/dL (ref 70–99)
HCT: 41 % (ref 36.0–46.0)
Hemoglobin: 13.3 g/dL (ref 12.0–15.0)
Lymphocytes Relative: 25 % (ref 12–46)
Lymphs Abs: 1.7 10*3/uL (ref 0.7–4.0)
MCHC: 32.4 g/dL (ref 30.0–36.0)
MCV: 97.6 fL (ref 78.0–100.0)
Monocytes Absolute: 0.6 10*3/uL (ref 0.1–1.0)
Monocytes Relative: 9 % (ref 3–12)
Neutro Abs: 4.4 10*3/uL (ref 1.7–7.7)
Neutrophils Relative %: 64 % (ref 43–77)
Platelets: 278 10*3/uL (ref 150–400)
Potassium: 4.2 meq/L (ref 3.5–5.3)
RBC: 4.2 M/uL (ref 3.87–5.11)
RDW: 13.5 % (ref 11.5–15.5)
Sodium: 143 meq/L (ref 135–145)
TSH: 9.612 microintl units/mL — ABNORMAL HIGH (ref 0.350–4.500)
WBC: 6.9 10*3/uL (ref 4.0–10.5)

## 2010-04-03 ENCOUNTER — Ambulatory Visit: Admission: RE | Admit: 2010-04-03 | Discharge: 2010-04-03 | Payer: Self-pay | Source: Home / Self Care

## 2010-04-03 DIAGNOSIS — N393 Stress incontinence (female) (male): Secondary | ICD-10-CM | POA: Insufficient documentation

## 2010-04-03 LAB — CONVERTED CEMR LAB
Bilirubin Urine: NEGATIVE
Blood in Urine, dipstick: NEGATIVE
Glucose, Urine, Semiquant: NEGATIVE
Ketones, urine, test strip: NEGATIVE
Nitrite: NEGATIVE
Protein, U semiquant: NEGATIVE
Specific Gravity, Urine: 1.02
Urobilinogen, UA: 0.2
WBC Urine, dipstick: NEGATIVE
pH: 6.5

## 2010-04-10 ENCOUNTER — Telehealth: Payer: Self-pay | Admitting: *Deleted

## 2010-04-26 ENCOUNTER — Encounter: Payer: Self-pay | Admitting: Obstetrics and Gynecology

## 2010-04-26 ENCOUNTER — Encounter: Payer: Self-pay | Admitting: Family Medicine

## 2010-05-05 ENCOUNTER — Ambulatory Visit
Admission: RE | Admit: 2010-05-05 | Discharge: 2010-05-05 | Payer: Self-pay | Source: Home / Self Care | Attending: Family Medicine | Admitting: Family Medicine

## 2010-05-05 DIAGNOSIS — J069 Acute upper respiratory infection, unspecified: Secondary | ICD-10-CM | POA: Insufficient documentation

## 2010-05-05 NOTE — Progress Notes (Signed)
Summary: triage  Phone Note Call from Patient Call back at Home Phone [phone]   Caller: Patient Summary of Call: has URI and wants to come today Initial call taken by: De Nurse,  December 22, 2009 11:02 AM  Follow-up for Phone Call        states she had a cold & is not better. wants to be seen this am. states she can be here in 20 minutes. appt made Follow-up by: Golden Circle RN,  December 22, 2009 11:03 AM

## 2010-05-05 NOTE — Progress Notes (Signed)
Summary: refill  Phone Note Refill Request Call back at Home Phone [phone] Message from:  Patient  Refills Requested: Medication #1:  LISINOPRIL 5 MG TABS 1 tab by mouth daily for blood pressure  Medication #2:  SIMVASTATIN 40 MG TABS 1 tab by mouth at bedtime for cholesterol not sure if she should still continue to take these -   Next Appointment Scheduled: 10/18 Initial call taken by: De Nurse,  January 12, 2010 9:54 AM Caller: Patient Initial call taken by: De Nurse,  January 12, 2010 9:51 AM  Follow-up for Phone Call        pt is aware Follow-up by: De Nurse,  January 13, 2010 11:24 AM    These meds were refilled in July 2011 with #6 refills. Pt should not need refills.  Pt needs to take thes meds.  She has not been advised to stop these meds.  Selene Peltzer MD  January 12, 2010 10:01 AM

## 2010-05-05 NOTE — Assessment & Plan Note (Signed)
Summary: depression,tcb   Vital Signs:  Patient profile:   65 year old female Weight:      111 pounds Temp:     98.1 degrees F oral Pulse rate:   97 / minute BP sitting:   127 / 85  (left arm)  Vitals Entered By: Alphia Kava (October 01, 2008 1:50 PM) CC: depression Is Patient Diabetic? No   Primary Care Provider:  [name]  MD  CC:  depression.  History of Present Illness: Here for depression and to restart osteoporosis medication. 1) Depression - she has struggled with this for years.  Used to be on lexapro but stopped.  Does not like to take medicines and often does not take what I prescribe her.  Had wanted her to try celexa back in the winter but she never filled the script.  Had someone close to her die recently that has also exacerbated the problem.  States she has no motivation to do anything.  SHe doesn't feel like "fixing herself up" with nice outfit, make up or dying her hair and she usually does.  She doesn't enjoy anything anymore.  She has trouble sleeping and is tired with no energy all the time.  She has a decreased appetite and feels hopeless.  Just is "tired of her life" though denies any thoughts of suicide or harming herself.  Says she is ready to try something.  SHe is just tired of feeling like this.  She does have an appt with a counselor, Letha Cape, next Wednesday.   2) Osteoporosis - has known osteoporosis but insurance won't pay for fosamax, would like rx for generic.   Habits & Providers  Alcohol-Tobacco-Diet     Tobacco Status: current     Cigarette Packs/Day: 0.5  Exercise-Depression-Behavior     Have you felt down or hopeless? yes     Have you felt little pleasure in things? yes     Depression Counseling: further diagnostic testing and/or other treatment is indicated  Allergies: 1)  Cipro 2)  Haldol (Haloperidol Lactate) 3)  * Toradol 4)  Trimox (Amoxicillin) 5)  Cipro 6)  Haldol (Haloperidol Lactate) 7)  * Toradol 8)  Trimox  (Amoxicillin)  Social History: Smoking Status:  current Packs/Day:  0.5  Physical Exam  Psych:  Oriented X3, memory intact for recent and remote, normally interactive, and good eye contact.  tearful, dysphoric affect.  No SI or HI.   Impression & Recommendations:  Problem # 1:  DEPRESSION, MAJOR, RECURRENT (ICD-296.30) Assessment Deteriorated  Worsened.  Sounds ready to try treatment.  Will try lexapro again.  If she has trouble with insurance paying for it, will do celexa instead.  Will see her back in 1 month for recheck.   Orders: [facility]- Est Level  3 (16109)  Problem # 2:  OSTEOPOROSIS (ICD-733.00) Assessment: Unchanged  Alendronate weekly.  Her updated medication list for this problem includes:    Sm Calcium/vitamin D 500-200 Mg-unit Tabs (Calcium carbonate-vitamin d) .[name]... Take 1 tablet by mouth twice a day    Alendronate Sodium 70 Mg Tabs (Alendronate sodium) .[name]... 1 tab by mouth once a week  Orders: [facility]- Est Level  3 (60454)  Complete Medication List: 1)  Sm Calcium/vitamin D 500-200 Mg-unit Tabs (Calcium carbonate-vitamin d) .... Take 1 tablet by mouth twice a day 2)  Flonase 50 Mcg/act Susp (Fluticasone propionate) .... 2 sprays each nostril daily 3)  Lexapro 10 Mg Tabs (Escitalopram oxalate) .[name].. 1 tab by mouth daily for 1  week then 2 tabs by mouth daily 4)  Claritin 10 Mg Tabs (Loratadine) .[name].. 1 tab by mouth daily 5)  Alendronate Sodium 70 Mg Tabs (Alendronate sodium) .[name].. 1 tab by mouth once a week  Other Orders: [facility] ([id number])  Patient Instructions: 1)  I'm sorry you are feeling so poorly.  Please try to start the lexapro right away.  You will start it as one tab a day for 1 week then increase to 2 tabs once a day.  At your next appointment if you are tolerating the medicine well, I will give you a new prescription for the 20 mg tabs so that you can just take 1 pill a day.   2)  If the lexapro costs too much at your pharmacy, ask them how much celexa is.   Celexa is very similar to lexapro and costs $4 at [place].  Let me know if  you can't afford lexapro and I will send in a prescription for Celexa.  Just let me know if you want it at your [facility] or at a [facility]. 3)  For your osteoporosis, start the alendronate.  You will take one pills one time a week.  Be sure to stay sitting upright or standing for to 1 hours after you take it.  4)  Please schedule a follow-up appointment in 1 month so that I can check and see how your depression is doing.  Prescriptions: ALENDRONATE SODIUM 70 MG TABS (ALENDRONATE SODIUM) 1 tab by mouth once a week  #4 x 5   Entered and Authorized by:   [name]  MD   Signed by:   [name]  MD on 10/01/2008   Method used:   Print then Give to Patient   RxID:   [id number] LEXAPRO 10 MG TABS (ESCITALOPRAM OXALATE) 1 tab by mouth daily for 1 week then 2 tabs by mouth daily  #60 x 1   Entered and Authorized by:   [name]  MD   Signed by:   [name]  MD on 10/01/2008   Method used:   Print then Give to Patient   RxID:   [id number]

## 2010-05-05 NOTE — Progress Notes (Signed)
Summary: Emergency Line Call  Phone Note Call from Patient Call back at Home Phone [phone]   Caller: Patient Summary of Call: Pt calling with questions re: statin causing liver and kidney damage.  Advised that she would have a stroke or heart attack if she went off statin and her cholesterol was then not controlled and that risk of liver damage was very small and the benefits outweight the risks.  She then had questions about some injection she got in the hospital that burned and then questions about opening arteries in her brain.  Advised that this was the Emergency Line and for life threatening emergencies only and that she could call back during business hours or see her MD if she had further questions.  Pt agreeable. Initial call taken by: Rodney Langton MD,  October 25, 2009 4:46 PM

## 2010-05-05 NOTE — Assessment & Plan Note (Signed)
Summary: bruise not healing/el   Vital Signs:  Patient Profile:   65 Years Old Female Height:     57 inches (144.78 cm) Weight:      99 pounds Pulse rate:   94 / minute BP sitting:   128 / 84  Vitals Entered By: Lillia Pauls CMA (January 25, 2007 3:04 PM)                 PCP:  Jackalyn Lombard MD  Chief Complaint:  BRUISES NOT HEALING.  History of Present Illness: 65 yo WF with hx of obsesive compulsive d/o , depression concerned about a 1 cm diameter bruise x 10 days that would not heal. Very anxius and worried about this. Pt has hx of several visits  to the office 2 to health issues such as "1  lymph node  enlarged , do I have cancer?", " I have a lump in my neck, do I have cancer? " ( cricoid cartilage that pt felt because she is very thing), etc, etc. She does not recall injury/ fall. Denies nose bleeds, blood in stools, vaginal bleed. Pt is not on anticoagulants.  NO bruises in the rest of her body.  Pt also requested Fosamax instead of the generic. Pt states that alendronate cause her side effects. I specifically requested a description of the side effects : " I need to think about it , I will remember", then she also said " people say that generic medication is not as good as the brands med" .  I believe that this pt may not experience side effects with alendronate, and she just want Fosamax because she heard that  generic meds are not as good as brand names.  Pt is to call with description of side effects. I have explain that alendronate is as good as fosamax and that she needs this medicine to treat her osteoporosis. Pt is not currently taking alendronate and her insurance will not cover fosamax . Pt is aware of the risks without this meds.   Current Allergies: CIPRO (CIPROFLOXACIN) HALDOL (HALOPERIDOL LACTATE) * TORADOL TRIMOX (AMOXICILLIN) CIPRO (CIPROFLOXACIN) HALDOL (HALOPERIDOL LACTATE) * TORADOL TRIMOX (AMOXICILLIN)    Risk Factors:  Mammogram History:      Date of Last Mammogram:  11/14/2006    Results:  normal     Physical Exam  General:     well-hydrated, underweight appearing, and unkempt.   Lungs:     Normal respiratory effort, chest expands symmetrically. Lungs are clear to auscultation, no crackles or wheezes. Heart:     Normal rate and regular rhythm. S1 and S2 normal without gallop, murmur, click, rub or other extra sounds. Abdomen:     soft, non-tender, normal bowel sounds, no distention, no masses, and no rigidity.   Pulses:     2 + peripheral pulses Extremities:     no edema Skin:     1 cm diameter bruise , resolving ( blue/yellowish color) . Skin intact    Impression & Recommendations:  Problem # 1:  CONTUSION, FACE (ICD-920) Echymosis on face resolving. Most likely 2 to contusion while sleeping ( pt woke up with the bruise). The rest of the skin without lesions. Pt is reasurred . Orders: [facility]- Est Level  3 (16109)   Problem # 2:  OSTEOPOROSIS (ICD-733.00) Pt informed that if she is not able to describe the side effects with alendronate , the fact that she heard in the street that generic meds are not as good as brand meds  is not enough to apply for free fosamax. Pt now has this obsesion and I don't believe that she will understand this.  Pt is to continue vitD and Ca. Her updated medication list for this problem includes:    Fosamax 70 Mg Tabs (Alendronate sodium) .[name]... Take 1 tablet by mouth once a week    Sm Calcium/vitamin D 500-200 Mg-unit Tabs (Calcium carbonate-vitamin d) .[name]... Take 1 tablet by mouth twice a day    Fosamax 70 Mg Tabs (Alendronate sodium) .[name]... Take 1 tablet by mouth once a week    Sm Calcium/vitamin D 500-200 Mg-unit Tabs (Calcium carbonate-vitamin d) .[name]... Take 1 tablet by mouth twice a day  Orders: [facility]- Est Level  3 ([id number])   Complete Medication List: 1)  Fosamax 70 Mg Tabs (Alendronate sodium) .... Take 1 tablet by mouth once a week 2)  Lexapro 20 Mg Tabs (Escitalopram oxalate)  .... Take 1 tablet by mouth once a day 3)  Sm Calcium/vitamin D 500-200 Mg-unit Tabs (Calcium carbonate-vitamin d) .... Take 1 tablet by mouth twice a day 4)  Fosamax 70 Mg Tabs (Alendronate sodium) .... Take 1 tablet by mouth once a week 5)  Lexapro 20 Mg Tabs (Escitalopram oxalate) .... Take 1 tablet by mouth once a day 6)  Sm Calcium/vitamin D 500-200 Mg-unit Tabs (Calcium carbonate-vitamin d) .... Take 1 tablet by mouth twice a day     ]  Preventive Care Screening  Mammogram:    Date:  11/14/2006    Next Due:  11/2007    Results:  normal

## 2010-05-05 NOTE — Assessment & Plan Note (Signed)
Summary: does not want to take prednisone-see notes-[place]   Vital Signs:  Patient profile:   65 year old female Height:      57 inches Weight:      114.4 pounds BMI:     24.85 Temp:     97 degrees F BP sitting:   157 / 79  (left arm) Cuff size:   regular  Vitals Entered By: Garen Grams LPN (December 03, 2009 2:03 PM) CC: wants to stop prednisone Is Patient Diabetic? No   Primary Care Provider:  Cat Ta MD  CC:  wants to stop prednisone.  History of Present Illness: 65 yo F:  1. COPD Exacerbation: patient seen yesterday. dx with COPD exaceration. tx with Prednisone, but patient endorsed reaction to prednisone. she requesting "a shot" today. smoked 1/2 ppd x 36 years. see below for symptoms.  URI Symptoms Onset: 1.5 weeks Description: Cough and feeling systemically unwell Modifying factors: Had z-pack 1 week ago with no resolution of symptoms, did not tolerate prednisone  Symptoms Nasal Discharge:Yes Fever: No Sore Throat: No Cough: Yes Wheezing: No Ear Pain: No GI Symptoms: No Sick Contacts: No  Red Flags  Stiff Neck: No Dyspnea: No Rash: No Swallowing Difficulty: No  Sinusitis Risk Factors Headache/Face Pain: No Tooth pain: No  Allergy Risk Factors Sneezing: No Itchy Scratchy Throat: Yes Seasonal Symptoms: Yes  Flu Risk Factors Headache: No Muscle aches: No Severe fatigue: No  Habits & Providers  Alcohol-Tobacco-Diet     Tobacco Status: current     Tobacco Counseling: to quit use of tobacco products  Current Medications (verified): 1)  Venlafaxine Hcl 75 Mg Tabs (Venlafaxine Hcl) .... Take 1 Tab Two Times A Day 2)  Alendronate Sodium 70 Mg Tabs (Alendronate Sodium) .[name].. 1 Tab By Mouth Once A Week 3)  Vitamin C 500 Mg  Tabs (Ascorbic Acid) .... Takes 4 Every Morning. 4)  Sm Calcium/vitamin D 500-200 Mg-Unit Tabs (Calcium Carbonate-Vitamin D) .... Take 1 Tablet By Mouth Twice A Day 5)  Halfprin 162 Mg Tbec (Aspirin) .[name].. 1 Tab By Mouth Daily 6)   Lisinopril 5 Mg Tabs (Lisinopril) .[name].. 1 Tab By Mouth Daily For Blood Pressure 7)  Levothroid 75 Mcg Tabs (Levothyroxine Sodium) .[name].. 1 Tab By Mouth Daily 8)  Simvastatin 40 Mg Tabs (Simvastatin) .[name].. 1 Tab By Mouth At Bedtime For Cholesterol 9)  Ambien 10 Mg Tabs (Zolpidem Tartrate) .... 1/4 Tab By Mouth At Bedtime Per [facility] 10)  Mobic 7.5 Mg Tabs (Meloxicam) .[name].. 1 Tab By Mouth Daily For Leg Pain 11)  Omeprazole 20 Mg Cpdr (Omeprazole) .[name].. 1 Tab By Mouth Daily 12)  Prednisone 50 Mg Tabs (Prednisone) .[name].. 1 By Mouth Daily X 5 Days  Allergies (verified): 1)  Cipro 2)  Haldol (Haloperidol Lactate) 3)  * Toradol 4)  Trimox (Amoxicillin) PMH-FH-SH reviewed for relevance  Review of Systems General:  Denies chills and fever. ENT:  Complains of nasal congestion and postnasal drainage; denies earache, sinus pressure, and sore throat. CV:  Denies chest pain or discomfort and swelling of feet. Resp:  Complains of cough and sputum productive; denies wheezing. Allergy:  Complains of seasonal allergies.  Physical Exam  General:  Well-developed, well-nourished, in no acute distress; alert, appropriate and cooperative throughout examination. Vitals reviewed.   Head:  Normocephalic and atraumatic.   Eyes:  EOMI.  PERRL. Ears:  R ear normal and L ear normal.   Nose:  Nasal discharge, mucosal pallor.   Mouth:  PND. Neck:  No  deformities, masses, or tenderness noted. Lungs:  Decreased breath sounds bilaterally. No wheeze. Heart:  Normal rate and regular rhythm. S1 and S2 normal without gallop, murmur, click, rub or other extra sounds. Pulses:  2 +DP. Extremities:  No edema. Psych:  Slightly pressured speech, does seem to jump from subjects, mood good, focused on steroid injection.   Impression & Recommendations:  Problem # 1:  CHRONIC OBSTRUCTIVE PULMONARY DISEASE, ACUTE EXACERBATION (ICD-491.21) Assessment Unchanged Patient with COPD, endorsing URI vs allergic rhinitis sxs. NO RED  FLAGS. Because patient focused on the steroid injection making her feel better, explained the benefits and risks of the medication (she endorsed understanding and requested), and gave Decadron IM x 1 (as this is the longest acting corticosteroid at the [facility]). No need for Abx today. Rx Tessalon for cough. Orders: [facility]- Est  Level 4 (99214)  Problem # 2:  ALLERGIC RHINITIS DUE TO POLLEN (ICD-477.0) Assessment: Unchanged  Suspect allergic rhinitis is playing a role here. Rx Claritin as she has this at home already.  Orders: [facility]- Est  Level 4 (57846)  Problem # 3:  TOBACCO USER (ICD-305.1) Assessment: Unchanged  Advised to quit.  Orders: [facility]- Est  Level 4 (96295)  Complete Medication List: 1)  Venlafaxine Hcl 75 Mg Tabs (Venlafaxine hcl) .... Take 1 tab two times a day 2)  Alendronate Sodium 70 Mg Tabs (Alendronate sodium) .[name].. 1 tab by mouth once a week 3)  Vitamin C 500 Mg Tabs (Ascorbic acid) .... Takes 4 every morning. 4)  Sm Calcium/vitamin D 500-200 Mg-unit Tabs (Calcium carbonate-vitamin d) .... Take 1 tablet by mouth twice a day 5)  Halfprin 162 Mg Tbec (Aspirin) .[name].. 1 tab by mouth daily 6)  Lisinopril 5 Mg Tabs (Lisinopril) .[name].. 1 tab by mouth daily for blood pressure 7)  Levothroid 75 Mcg Tabs (Levothyroxine sodium) .[name].. 1 tab by mouth daily 8)  Simvastatin 40 Mg Tabs (Simvastatin) .[name].. 1 tab by mouth at bedtime for cholesterol 9)  Ambien 10 Mg Tabs (Zolpidem tartrate) .... 1/4 tab by mouth at bedtime per [facility] 10)  Mobic 7.5 Mg Tabs (Meloxicam) .[name].. 1 tab by mouth daily for leg pain 11)  Omeprazole 20 Mg Cpdr (Omeprazole) .[name].. 1 tab by mouth daily 12)  Prednisone 50 Mg Tabs (Prednisone) .[name].. 1 by mouth daily x 5 days  Other Orders: Digoxin-[facility] ([id number]) Dexamethasone Sodium Phosphate 1mg  (J1100)  Patient Instructions: 1)  It was nice to meet you today. 2)  We are giving you a steroid shot today. 3)  You may take Claritin for allergies. 4)  You may take  Tessalon for your cough.   Medication Administration  Injection # 1:    Medication: Dexamethasone Sodium Phosphate 1mg     Diagnosis: COPD (ICD-496)    Route: IM    Site: RUOQ gluteus    Exp Date: 09/04/2010    Lot #: [id number]    Mfr: [employer]    Comments: Administered 4 mg in 1 ml    Patient tolerated injection without complications    Given by: Dennison Nancy RN (December 03, 2009 2:56 PM)  Orders Added: 1)  Digoxin-[facility] [[id number]] 2)  Dexamethasone Sodium Phosphate 1mg  [J1100] 3)  [facility]- Est  Level 4 [95638]

## 2010-05-05 NOTE — Assessment & Plan Note (Signed)
Summary: f/u last visit/eo   Vital Signs:  Patient profile:   65 year old female Height:      57 inches Weight:      108.44 pounds BMI:     23.55 Temp:     97.7 degrees F oral Pulse rate:   93 / minute Pulse rhythm:   regular BP sitting:   110 / 76  (right arm)  Vitals Entered By: Modesta Messing LPN (December 13, 2008 2:08 PM) CC: Sore throat.  Needs script refills. Is Patient Diabetic? No Pain Assessment Patient in pain? no        Primary Care Provider:  [name]  MD  CC:  Sore throat.  Needs script refills..  History of Present Illness: [name] is here today for follow-up of depression and to have synthroid refilled. 1) Depression - hospitalized in [facility] end of July/Early August for SI.  See last ov note for details.  States no longer taking geodon as it made her dizziness but still taking lexapro and lamictal without any problem.  Did not notice any worsening when stopping geodon.  Admits psychotic and paranoid thoughts have stopped.  She is feeling quite a bit better but does still feel like her motivation and appetitie are lacking.  Doesn't like living alone and thinks that may be part of it.  Never called [employer] as suggested at last office visit.  No SI or HI now. 2) Hypothyroidism - takes synthroid .  Hasn't had TSH checked in 6 months and has been off and on, though on it since her hospitalization in August.   Habits & Providers  Alcohol-Tobacco-Diet     Tobacco Status: quit  Allergies: 1)  Cipro 2)  Haldol (Haloperidol Lactate) 3)  * Toradol 4)  Trimox (Amoxicillin)  Social History: Smoking Status:  quit  Physical Exam  General:  thin, alert, NAD, cooperative to exam vitals reviewed.  Psych:  Oriented X3, memory intact for recent and remote, normally interactive, and good eye contact.  Better grooming than before today.  Obviously less anxiety, mood clearly improved.  Somewhat brighter afffect, though still subdued.     Impression & Recommendations:  Problem # 1:  BIPOLAR DISORDER UNSPECIFIED (ICD-296.80) Assessment Improved  Depression is improving.  Endorses doing well despite stopping geodon.  Suggested adding wellbutrin for the continued lack of motivation and appetite but patient did not tolerate in the past.  She prefers to stick with what she is on and see if it gradually improves.  I am okay with this plan as well.  I agree she needs more social interaction.  Once again encouraged her to call [address] to see if any volunteer opps or groups available to join or assist.   Orders: [facility]- Est Level  3 (09811)  Problem # 2:  HYPOTHYROIDISM (ICD-244.9) Check TSH today.  Her updated medication list for this problem includes:    Synthroid 75 Mcg Tabs (Levothyroxine sodium) .[name]... 1 tab by mouth daily  Orders: TSH-[facility] ([id number]) [facility]- Est Level  3 (13086)  Complete Medication List: 1)  Sm Calcium/vitamin D 500-200 Mg-unit Tabs (Calcium carbonate-vitamin d) .... Take 1 tablet by mouth twice a day 2)  Flonase 50 Mcg/act Susp (Fluticasone propionate) .... 2 sprays each nostril daily 3)  Lexapro 20 Mg Tabs (Escitalopram oxalate) .[name].. 1 tab by mouth daily 4)  Claritin 10 Mg Tabs (Loratadine) .[name].. 1 tab by mouth daily 5)  Alendronate Sodium 70 Mg Tabs (Alendronate sodium) .[name].. 1 tab  by mouth once a week 6)  Lamotrigine 100 Mg Tabs (Lamotrigine) .... 1/2 tablet twice daily 7)  Synthroid 75 Mcg Tabs (Levothyroxine sodium) .[name].. 1 tab by mouth daily  Patient Instructions: 1)  I'm so glad you are doing better.  Hopefully you will continue to improve. 2)  I really encourage you to call the [address] at the number Dr. Sharen Hones gave you.  I really think you will benefit from the social interaction. 3)  I will let you know next week the result of your thyroid test. 4)  Have  a wonderful weekend.  Prescriptions: SYNTHROID 75 MCG TABS (LEVOTHYROXINE SODIUM) 1 tab by mouth daily  #90 x 3   Entered and Authorized  by:   [name]  MD   Signed by:   [name]  MD on 12/13/2008   Method used:   Print then Give to Patient   RxID:   [id number] LEXAPRO 20 MG TABS (ESCITALOPRAM OXALATE) 1 tab by mouth daily  #30 x 11   Entered and Authorized by:   [name]  MD   Signed by:   [name]  MD on 12/13/2008   Method used:   Historical   RxID:   [id number]

## 2010-05-05 NOTE — Progress Notes (Signed)
Summary: dental referral ready for pt/ts  Phone Note Call from Patient Call back at Home Phone [phone]   Caller: Patient Summary of Call: needs referral for the dental clinic - she will come pick it up as soon as it's ready Initial call taken by: De Nurse,  January 23, 2009 12:24 PM  Follow-up for Phone Call        see previous message. fwd. to dr.mayans Follow-up by: [name] CMA,,  January 23, 2009 4:01 PM  Additional Follow-up for Phone Call Additional follow up Details #1::        Put in order.  Be sure to tell her about the free dental clinic on Nov 5th and 6th - she likely won't get into a dentist before then. Additional Follow-up by: Lamar Laundry, MD January 24, 2009 08:30AM  New Problems: DENTAL PAIN (ICD-525.9)   Additional Follow-up for Phone Call Additional follow up Details #2::    called pt. no answer. dental referral up front for pick up. waiting for pt to call back. Follow-up by: [name] CMA,,  January 24, 2009 8:46 AM  Additional Follow-up for Phone Call Additional follow up Details #3:: Details for Additional Follow-up Action Taken: called pt again. no answer. Additional Follow-up by: [name] CMA,,  January 24, 2009 11:33 AM  New Problems: DENTAL PAIN (ICD-525.9)

## 2010-05-05 NOTE — Letter (Signed)
Summary: Generic Letter  [facility]  [address]   [place], [place] [address]   Phone: [phone]  Fax: [phone]    12/23/2009  RAJVI ARMENTOR [address] [place], [place]  [address]  Dear Ms. [facility], It wanted to let you know that the results of your Chest CT were negative for any significant pulmonary nodules and negative for any evidence of pneumonia. I would encourage you to continue with decreasing your smoking as well as continue with supportive care for your cough and cough induced chest pain. As always, if you have any questions, please feel free to give [place] a call.           Sincerely,   Doree Albee MD  Appended Document: Generic Letter letter mailed.

## 2010-05-05 NOTE — Assessment & Plan Note (Signed)
Summary: Acute Visit for followup on URI    Vital Signs:  Patient profile:   65 year old female Height:      57 inches Weight:      114 pounds BMI:     24.76 Temp:     98.0 degrees F oral Pulse rate:   112 / minute BP sitting:   102 / 73  (left arm) Cuff size:   regular  Vitals Entered By: Jimmy Footman, CMA (December 22, 2009 11:38 AM) CC: uri sxs x3 weeks   Primary Care Provider:  Cat Ta MD  CC:  uri sxs x3 weeks.  History of Present Illness: 29 YOF w/ PMHx/o recent URI and COPD here for followup acute visit:   URI: Pt reprots rhinorrhea resolvedsince  last week. No fevers per tp. Nasal congestion improved. Pt does report minimal amount of blood tinged sputum.  Still smoking >1/2 ppd. Also complaining of pleuritic pain with coughing and pain in upper back. Overall, pt feels better but wondering why her chest hurts and why is it taking so long for sxs to go away.    Allergies: 1)  Cipro 2)  Haldol (Haloperidol Lactate) 3)  * Toradol 4)  Trimox  Physical Exam  General:  alert and well-developed.   Head:  normocephalic and atraumatic.   Eyes:  vision grossly intact.   Ears:  R ear normal and L ear normal.   Nose:  no external deformity.  minimal nasal erythema bilaterally, no rhinorrhea  Mouth:  pharynx pink and moist, minimal erythema  Neck:  supple and full ROM, no LAD  Lungs:  CTAB, no wheezes, rales, rhoncii, + cough w/ deep inspiration  Heart:  RRR, no rubs, gallops, murmurs.  Abdomen:  soft, non-tender, and normal bowel sounds.   Extremities:  No clubbing, cyanosis, edema, or deformity noted  Neurologic:  alert & oriented X3.     Impression & Recommendations:  Problem # 1:  UPPER RESPIRATORY INFECTION (ICD-465.9) Assessment Unchanged Overall symptomatically improved. However will check CXR in setting of persistent pleuritic pain, deep inspiration induced cough, and + smoking history, +/- minimal blood tinged sputum. Pt instructed to use tylenol for pleuritic  pain-will hold on NSAIDs as pt on ACEis and w/ baseline soft BPs to aboid ARF.Told pt that smoking dramatically slows healing-still consider smoking cessation despite red flags. Will folowup w/ pt pending CXR read as to need for abx. Pt agreeable to plan.  Her updated medication list for this problem includes:    Halfprin 162 Mg Tbec (Aspirin) .[name]... 1 tab by mouth daily    Mobic 7.5 Mg Tabs (Meloxicam) .[name]... 1 tab by mouth daily for leg pain    Tylenol Extra Strength 500 Mg Tabs (Acetaminophen) .[name]... Take 1-2 tabs every 6 hours as needed for pain-do not take more than 4 grams total of acetaminophen daily  Orders: CXR- 2view (CXR) CT (CT)  Problem # 2:  TOBACCO USER (ICD-305.1) Assessment: Unchanged See Problem #1.   Complete Medication List: 1)  Venlafaxine Hcl 75 Mg Tabs (Venlafaxine hcl) .... Take 1 tab two times a day 2)  Alendronate Sodium 70 Mg Tabs (Alendronate sodium) .[name].. 1 tab by mouth once a week 3)  Vitamin C 500 Mg Tabs (Ascorbic acid) .... Takes 4 every morning. 4)  Sm Calcium/vitamin D 500-200 Mg-unit Tabs (Calcium carbonate-vitamin d) .... Take 1 tablet by mouth twice a day 5)  Halfprin 162 Mg Tbec (Aspirin) .[name].. 1 tab by mouth daily 6)  Lisinopril 5  Mg Tabs (Lisinopril) .[name].. 1 tab by mouth daily for blood pressure 7)  Levothroid 75 Mcg Tabs (Levothyroxine sodium) .[name].. 1 tab by mouth daily 8)  Simvastatin 40 Mg Tabs (Simvastatin) .[name].. 1 tab by mouth at bedtime for cholesterol 9)  Ambien 10 Mg Tabs (Zolpidem tartrate) .... 1/4 tab by mouth at bedtime per [facility] 10)  Mobic 7.5 Mg Tabs (Meloxicam) .[name].. 1 tab by mouth daily for leg pain 11)  Omeprazole 20 Mg Cpdr (Omeprazole) .[name].. 1 tab by mouth daily 12)  Tylenol Extra Strength 500 Mg Tabs (Acetaminophen) .... Take 1-2 tabs every 6 hours as needed for pain-do not take more than 4 grams total of acetaminophen daily  Patient Instructions: 1)  It was good to meet you today  2)  take tylenol as needed for pain 3)  If  your symptoms get worse (fever, cough, increased work of breathing), give [place] a call  4)  We will also get a chest x-ray 5)  I will call you with the results if they are abnormal  6)  Otherwise call with any questions 7)  God Bless,  8)  Doree Albee MD   Prescriptions: TYLENOL EXTRA STRENGTH 500 MG TABS (ACETAMINOPHEN) take 1-2 tabs every 6 hours as needed for pain-DO NOT take more than 4 grams total of acetaminophen daily  #100 x 1   Entered and Authorized by:   Doree Albee MD   Signed by:   Doree Albee MD on 12/22/2009   Method used:   [facility]        [facility] [address] [phone]* (retail)       [address]       [place], [place]  [address]       Ph: [phone] or [phone]       Fax: [phone]   RxID:   [id number]   Appended Document: Acute Visit for followup on URI     Clinical Lists Changes  Orders: Added new Test order of [facility]- Est Level  3 ([address]) - Signed

## 2010-05-05 NOTE — Letter (Signed)
Summary: [facility]  [facility]   Imported By: Clydell Hakim 12/04/2008 10:33:53  _____________________________________________________________________  External Attachment:    Type:   Image     Comment:   External Document

## 2010-05-05 NOTE — Progress Notes (Signed)
Summary: pls call  Phone Note Call from Patient Call back at Home Phone [phone]   Caller: Patient Summary of Call: has question about TB test from yesterday Initial call taken by: De Nurse,  May 14, 2008 1:51 PM  Follow-up for Phone Call        Tried to call patient at 2:44 pm, but there was no answer and no voicemail.   Follow-up by: Modesta Messing LPN,  May 14, 2008 2:46 PM  Additional Follow-up for Phone Call Additional follow up Details #1::        Called and left a message on voicemail for her to call us back. Additional Follow-up by: Modesta Messing LPN,  May 15, 2008 8:50 AM    Additional Follow-up for Phone Call Additional follow up Details #2::    Patient called back and I answered her questions.  She had a stomach virus and wondered if the TB test caused it. Follow-up by: Modesta Messing LPN,  May 15, 2008 9:01 AM

## 2010-05-05 NOTE — Progress Notes (Signed)
Summary: severe depression  Phone Note Call from Patient Call back at Home Phone [phone]   Caller: Patient Summary of Call: calling because she feels like her depression is much more severe than she previously expressed to Dr Georgiana Shore.  She came to this realization after speaking with her sisters.  Patient states her sister is doing well on antipsychotic (zyprexa) that has areally helped her.  advised her we cannot just change to this medication without an office visit as it requires blood work, etc before starting.  She states she feels paranoid and hopeless and feels like people are out to get her or kill her.  She denies any current plan of SI or HI.  States she has an appt with Arletta Bale psychotherapist in the next week.   She would really like to be seen by Dr Georgiana Shore as soon as possible to discuss this with her.   Advised her to call in the morning, also that I will send a flag to triage to try to get her in with Dr Georgiana Shore as soon as possible.  She also had questions if we had psych folks we worked with.  Advised that we do have a MDC here at [facility].   Initial call taken by: Ancil Boozer  MD,  October 07, 2008 3:24 PM      Appended Document: severe depression no answer at given number  Appended Document: severe depression appt with pcp Thursday at 1:30. denies SI or HI. has appt tomorrow with therapist

## 2010-05-05 NOTE — Letter (Signed)
Summary: Generic Letter  [facility]  [address]   [place], [place] [address]   Phone: [phone]  Fax: [phone]    04/07/2009  [name] [address] [place], [place]  [address]  Dear [name],  I wanted to let you know that your neck ultrasound looks good. The lumpy area that you are concerned about is actually one of your neck muscles. The radiologist recommends a follow-up ultrasound in 6 months. If you have any questions about this, I'm sure Dr. Georgiana Shore will be happy to discuss them with you. Nice to meet you the other day.   Sincerely,   Myrtie Soman  MD  Appended Document: Generic Letter mailed.

## 2010-05-05 NOTE — Progress Notes (Signed)
Summary: Triage  Phone Note Call from Patient Call back at [phone]   Summary of Call: wants to speak with rn about place on her neck, is very concerned about it. Initial call taken by: Haydee Salter,  March 05, 2008 2:17 PM  Follow-up for Phone Call        patient was seen by Dr. Georgiana Shore in October for area on neck and she has continued to watch and worry about this area. appointment sacheduled with pcp on 03/21/08 to come back to follow up and recheck. states she has had some hoarseness off and on for a year. she cannot tell that area has increased in size.  she is unable to come in for work in now but may decide to call back for sooner work in appointment.  Follow-up by: Theresia Lo RN,  March 05, 2008 2:34 PM

## 2010-05-05 NOTE — Miscellaneous (Signed)
Summary: dental referral up front/ts  Clinical Lists Changes called pt again. no answer. dental referral and info up front for pt to pick up .Marland KitchenArlyss Repress CMA,  January 27, 2009 8:35 AM

## 2010-05-05 NOTE — Letter (Signed)
Summary: Generic Letter  [facility]  [address]   [place], [place] [address]   Phone: [phone]  Fax: [phone]    10/02/2008  [name] [address] [place], [place]  [address]  Dear [name],   Your hemoglobin and other blood counts are normal. If you have any questions, please call.         Sincerely,   [name]  MD  Appended Document: Generic Letter mailed

## 2010-05-05 NOTE — Progress Notes (Signed)
Summary: Rx Ques  Phone Note Call from Patient Call back at Home Phone [phone]   Caller: Patient Summary of Call: can she go to 30 mgs on her Lexapro she can see the differecnce some but not enough.   Initial call taken by: Clydell Hakim,  October 18, 2008 1:34 PM  Follow-up for Phone Call        will sne message to MD. Follow-up by: Theresia Lo RN,  October 18, 2008 1:46 PM  Additional Follow-up for Phone Call Additional follow up Details #1::        20 mg is max dose, so no.  Have her give it another 2 weeks.  If still not doing good enough, we will add another medicine.  Additional Follow-up by: Lamar Laundry, MD July 19th, 2010 08:30    Additional Follow-up for Phone Call Additional follow up Details #2::    attempted call back . no answer. Follow-up by: Theresia Lo RN,  October 21, 2008 8:56 AM

## 2010-05-05 NOTE — Progress Notes (Signed)
Summary: Rx/called pt/ts  Phone Note Call from Patient Call back at Home Phone [phone] Call back at [phone]   Reason for Call: Talk to Doctor Summary of Call: pt sts she received potassium while in the hospital and needs a new rx written so she can keep taking it. Initial call taken by: Knox Royalty,  October 24, 2009 1:43 PM  Follow-up for Phone Call        pt informed. verbalized understanding Follow-up by: [name] CMA,,  October 24, 2009 4:25 PM    I do not see potassium in her discharge medications.  It was repleted in hosp.  She does not need to take now. Cat Ta MD  October 24, 2009 2:50 PM

## 2010-05-05 NOTE — Assessment & Plan Note (Signed)
Summary: f/u visit/bmc   Vital Signs:  Patient profile:   65 year old female Height:      57 inches Weight:      114 pounds BMI:     24.76 Temp:     98.3 degrees F oral Pulse rate:   96 / minute BP sitting:   134 / 76  (right arm) Cuff size:   regular  Vitals Entered By: Jimmy Footman, CMA (November 27, 2009 1:35 PM) CC: f/u thyroid Is Patient Diabetic? No   Primary Care Provider:  Natthew Marlatt MD  CC:  f/u thyroid.  History of Present Illness: 65 y/o F here for   Pt is not sure why she is here.  She thinks it is for thyroid f/u.  Pt has not taken thyroid medication for months prior to admission in 10/2009.  Since then she has been taking Synthroid daily. NO chest pain, no palpiation, dyspnea, no constipation, diarrhea  Was seen in UC yesterday and Rx Z-pack for URI   Pap: Dr Annamarie Dawley at [facility].  Pt states that she needs to make appt.   Habits & Providers  Alcohol-Tobacco-Diet     Tobacco Status: current     Tobacco Counseling: to quit use of tobacco products     Cigarette Packs/Day: 0.5     Year Quit: 2010     Pack years: 10  Current Medications (verified): 1)  Venlafaxine Hcl 75 Mg Tabs (Venlafaxine Hcl) .... Take 1 Tab Two Times A Day 2)  Alendronate Sodium 70 Mg Tabs (Alendronate Sodium) .[name].. 1 Tab By Mouth Once A Week 3)  Vitamin C 500 Mg  Tabs (Ascorbic Acid) .... Takes 4 Every Morning. 4)  Sm Calcium/vitamin D 500-200 Mg-Unit Tabs (Calcium Carbonate-Vitamin D) .... Take 1 Tablet By Mouth Twice A Day 5)  Halfprin 162 Mg Tbec (Aspirin) .[name].. 1 Tab By Mouth Daily 6)  Lisinopril 5 Mg Tabs (Lisinopril) .[name].. 1 Tab By Mouth Daily For Blood Pressure 7)  Levothroid 75 Mcg Tabs (Levothyroxine Sodium) .[name].. 1 Tab By Mouth Daily 8)  Simvastatin 40 Mg Tabs (Simvastatin) .[name].. 1 Tab By Mouth At Bedtime For Cholesterol 9)  Ambien 10 Mg Tabs (Zolpidem Tartrate) .... 1/4 Tab By Mouth At Bedtime Per [facility] 10)  Mobic 7.5 Mg Tabs (Meloxicam) .[name].. 1 Tab By Mouth  Daily For Leg Pain 11)  Omeprazole 20 Mg Cpdr (Omeprazole) .[name].. 1 Tab By Mouth Daily  Allergies (verified): 1)  Cipro 2)  Haldol (Haloperidol Lactate) 3)  * Toradol 4)  Trimox (Amoxicillin)  Review of Systems       per hpi   Physical Exam  General:  Well-developed,well-nourished,in no acute distress; alert,appropriate and cooperative throughout examination. vitals reviewed.   Neck:  No deformities, masses, or tenderness noted. Lungs:  Normal respiratory effort, chest expands symmetrically. Lungs are clear to auscultation, no crackles or wheezes. Heart:  Normal rate and regular rhythm. S1 and S2 normal without gallop, murmur, click, rub or other extra sounds.   Impression & Recommendations:  Problem # 1:  HYPOTHYROIDISM (ICD-244.9) Assessment Unchanged Will check TSH today.  Last TSH 09/28/2009 was 91.26 since pt was off meds for a long time.  Will assess and dose as needed.  Her updated medication list for this problem includes:    Levothroid 75 Mcg Tabs (Levothyroxine sodium) .[name]... 1 tab by mouth daily  Orders: TSH-[facility] ([id number]) [facility]- Est Level  3 ([address])  Problem # 2:  Screening Breast Cancer (ICD-V76.10) Assessment: Comment  Only Pt remined to get MMG.    Problem # 3:  POSTMENOPAUSAL STATUS (ICD-V49.81) Assessment: Comment Only Needs Dexa scan.  Ordered today.  Orders: Dexa scan (Dexa scan)  Problem # 4:  Preventive Health Care (ICD-V70.0) Assessment: Comment Only Discussed pap.  She gets it done at [facility].   Complete Medication List: 1)  Venlafaxine Hcl 75 Mg Tabs (Venlafaxine hcl) .... Take 1 tab two times a day 2)  Alendronate Sodium 70 Mg Tabs (Alendronate sodium) .[name].. 1 tab by mouth once a week 3)  Vitamin C 500 Mg Tabs (Ascorbic acid) .... Takes 4 every morning. 4)  Sm Calcium/vitamin D 500-200 Mg-unit Tabs (Calcium carbonate-vitamin d) .... Take 1 tablet by mouth twice a day 5)  Halfprin 162 Mg Tbec (Aspirin) .[name].. 1 tab by mouth daily 6)   Lisinopril 5 Mg Tabs (Lisinopril) .[name].. 1 tab by mouth daily for blood pressure 7)  Levothroid 75 Mcg Tabs (Levothyroxine sodium) .[name].. 1 tab by mouth daily 8)  Simvastatin 40 Mg Tabs (Simvastatin) .[name].. 1 tab by mouth at bedtime for cholesterol 9)  Ambien 10 Mg Tabs (Zolpidem tartrate) .... 1/4 tab by mouth at bedtime per [facility] 10)  Mobic 7.5 Mg Tabs (Meloxicam) .[name].. 1 tab by mouth daily for leg pain 11)  Omeprazole 20 Mg Cpdr (Omeprazole) .[name].. 1 tab by mouth daily  Other Orders: Mammogram (Screening) (Mammo)  Patient Instructions: 1)  Please schedule a follow-up appointment in 2 months.  2)  Tobacco is very bad for your health and your loved ones ! You should stop smoking !  3)  Stop smoking tips: Choose a quit date. Cut down before the quit date. Decide what you will do as a substitute when you feel the urge to smoke(gum, toothpick, exercise).  4)  Schedule your mammogram.  5)  You need to have a Pap Smear to prevent cervical cancer. 6)  Take calcium +vitamin D daily.  7)  I will call you about your thyroid.    Immunizations Administered:  Tetanus Vaccine:    Vaccine Type: Historical    Given by: Angeline Slim MD    VIS given: 02/21/07 version given November 27, 2009.  Hepatitis B Vaccine # 1 (to be given today)  DPT Vaccine # 1 (to be given today)  HIB Vaccine # 1 (to be given today)  Hepatitis A Vaccine # 1 (to be given today)   Prevention & Chronic Care Immunizations   Influenza vaccine: Fluvax 3+  (01/02/2007)   Influenza vaccine due: 01/02/2008    Tetanus booster: 11/27/2009: Historical    Pneumococcal vaccine: Pneumovax (State)  (01/02/2007)   Pneumococcal vaccine due: None    H. zoster vaccine: Not documented  Colorectal Screening   Hemoccult: normal  (01/29/2008)   Hemoccult due: 01/28/2009    Colonoscopy: Done.  (06/03/2005)   Colonoscopy due: 06/04/2015  Other Screening   Pap smear: Not documented    Mammogram: normal  (11/14/2006)   Mammogram  action/deferral: Ordered  (11/27/2009)   Mammogram due: 11/2007    DXA bone density scan: Not documented   DXA bone density action/deferral: Ordered  (11/27/2009)   Smoking status: current  (11/27/2009)   Smoking cessation counseling: YES  (11/27/2009)  Lipids   Total Cholesterol: 207  (11/29/2006)   LDL: 166  (09/19/2009)   LDL Direct: Not documented   HDL: 57  (09/19/2009)   Triglycerides: 154  (09/19/2009)   Nursing Instructions: Schedule screening mammogram (see order) Schedule screening DXA bone density  scan (see order)

## 2010-05-05 NOTE — Letter (Signed)
Summary: [facility]  Hemoccult cards neg x 3 on 01/27/08, 01/28/08, and 01/29/08   Hemoccult Result Date:  01/29/2008 Hemoccult Result:  normal Hemoccult Next Due:  1 yr

## 2010-05-05 NOTE — Letter (Signed)
Summary: Generic Letter  [facility]  [address]   [place], [place] [address]   Phone: [phone]  Fax: [phone]    12/16/2008  DELORESE SELLIN [address] [place], [place]  [address]  Dear Ms. Schader,  The results of your thyroid test were normal.  This means you are on the correct dose of synthroid.  Please continue taking your current dose. If you have any questions, please call.          Sincerely,   [name]  MD  Appended Document: Generic Letter mailed.

## 2010-05-05 NOTE — Progress Notes (Signed)
  Phone Note Call from Patient   Caller: Patient Summary of Call: Please call patient back about imaging results. Initial call taken by: Britta Mccreedy mcgregor  Follow-up for Phone Call        will forward to MD. Follow-up by: Theresia Lo RN,  December 24, 2009 3:58 PM  Additional Follow-up for Phone Call Additional follow up Details #1::        pt is calling again Additional Follow-up by: De Nurse,  December 24, 2009 4:37 PM    Additional Follow-up for Phone Call Additional follow up Details #2::    letter from MD  read to patient  regarding xray. Follow-up by: Theresia Lo RN,  December 24, 2009 4:49 PM

## 2010-05-05 NOTE — Consult Note (Signed)
[facility]   Imported By: De Nurse 06/07/2008 12:15:21  _____________________________________________________________________  External Attachment:    Type:   Image     Comment:   External Document

## 2010-05-05 NOTE — Miscellaneous (Signed)
Summary: Rx change  Clinical Lists Changes  Medications: Added new medication of OMEPRAZOLE 20 MG CPDR (OMEPRAZOLE) 1 tab by mouth daily - Signed Rx of OMEPRAZOLE 20 MG CPDR (OMEPRAZOLE) 1 tab by mouth daily;  #30 x 11;  Signed;  Entered by: Angeline Slim MD;  Authorized by: Angeline Slim MD;  [facility] [address] [phone]*, [address] [place], [place], [place]  [address], Ph: [phone] or [phone], Fax: [phone]    Prescriptions: OMEPRAZOLE 20 MG CPDR (OMEPRAZOLE) 1 tab by mouth daily  #30 x 11   Entered and Authorized by:   Angeline Slim MD   Signed by:   Angeline Slim MD on 10/31/2009   Method used:   [facility]ron[facility] to        [facility] [address] [phone]* (retail)       [address]       [place], [place]  [address]       Ph: [phone] or [phone]       Fax: [phone]   RxID:   [id number]
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
## 2010-05-05 NOTE — Miscellaneous (Signed)
Summary: upper back pain, nausea  Clinical Lists Changes has pain after eating in upper back, also nauseous. had been to ED for this. she had thought it was a heart attack. was kept overnight & sent home. denies cp at this time. very worried. appt at 1:30 today. unable to come any other time due to other appts.Golden Circle RN  Aug 14, 2009 12:03 PM  thanks.  will route to Dr. Georgiana Shore. Eustaquio Boyden  MD  Aug 14, 2009 2:16 PM

## 2010-05-05 NOTE — Progress Notes (Signed)
Summary: ? about PPD  Phone Note Call from Patient Call back at Home Phone [phone]   Caller: Patient Summary of Call: called about TB skin test - had more questions about how it should look and what it means.   Discussed that it is helping to test if she has even been exposed to tuberculosis.   She states currently it is no longer swollen or red.   Asked about symptoms of TB.   Pt very anxious over the phone.  Initial call taken by: Ancil Boozer  MD,  May 13, 2008 8:38 PM

## 2010-05-05 NOTE — Assessment & Plan Note (Signed)
Summary: arm pain wp   Vital Signs:  Patient Profile:   65 Years Old Female Height:     57 inches (144.78 cm) Weight:      101.3 pounds BMI:     22.00 Temp:     98.4 degrees F oral Pulse rate:   76 / minute BP sitting:   118 / 75  (left arm)  Pt. in pain?   no  Vitals Entered By: Garen Grams LPN (August 01, 2007 1:39 PM)                  PCP:  Jackalyn Lombard MD  Chief Complaint:  sore throat and watery eyes.  History of Present Illness: 65 yo   1. sore throat x few days.  no fever, no cough.  hoarseness when talks but that goes away.  tired.  more stressed lately.  sob, chronic.  heart beats fasts with stress.  let ear pain, everything is loud.  nausea off and on for a long time.  has nausea 2 minutes ago but a few seconds but gone.  occurs with stress.    2. thyroid - checked last week.  in 3 months will be started on synthroid by another doctor.    Current Allergies: CIPRO (CIPROFLOXACIN) HALDOL (HALOPERIDOL LACTATE) * TORADOL TRIMOX (AMOXICILLIN) CIPRO (CIPROFLOXACIN) HALDOL (HALOPERIDOL LACTATE) * TORADOL TRIMOX (AMOXICILLIN)      Physical Exam  General:     Well-developed,well-nourished,in no acute distress; alert,appropriate and cooperative throughout examination Head:     Normocephalic and atraumatic without obvious abnormalities. No apparent alopecia or balding. Eyes:     No corneal or conjunctival inflammation noted. EOMI. P[place]la. Funduscopic exam benign, without hemorrhages, exudates or papilledema. Vision grossly normal. Ears:     External ear exam shows no significant lesions or deformities.  Otoscopic examination reveals clear canals, tympanic membranes are intact bilaterally without bulging, retraction, inflammation or discharge. Hearing is grossly normal bilaterally. Nose:     External nasal examination shows no deformity or inflammation. Nasal mucosa are pink and moist without lesions or exudates. Mouth:     Oral mucosa and oropharynx  without lesions or exudates.  Teeth in good repair. Neck:     No deformities, masses, or tenderness noted.  no thyromegaly appreciated. Lungs:     Normal respiratory effort, chest expands symmetrically. Lungs are clear to auscultation, no crackles or wheezes. Heart:     Normal rate and regular rhythm. S1 and S2 normal without gallop, murmur, click, rub or other extra sounds.    Impression & Recommendations:  Problem # 1:  VIRAL URI (ICD-465.9) Assessment: New Virus - plents of rest and eat well.  drink lots of fluids.   Orders: [facility]- Est Level  3 (97673)   Complete Medication List: 1)  Fosamax 70 Mg Tabs (Alendronate sodium) .... Take 1 tablet by mouth once a week 2)  Lexapro 20 Mg Tabs (Escitalopram oxalate) .... Take 1 tablet by mouth once a day 3)  Sm Calcium/vitamin D 500-200 Mg-unit Tabs (Calcium carbonate-vitamin d) .... Take 1 tablet by mouth twice a day 4)  Fosamax 70 Mg Tabs (Alendronate sodium) .... Take 1 tablet by mouth once a week 5)  Lexapro 20 Mg Tabs (Escitalopram oxalate) .... Take 1 tablet by mouth once a day 6)  Sm Calcium/vitamin D 500-200 Mg-unit Tabs (Calcium carbonate-vitamin d) .... Take 1 tablet by mouth twice a day   Patient Instructions: 1)  Virus - plents of rest and eat well.  drink  lots of fluids.      ]

## 2010-05-05 NOTE — Assessment & Plan Note (Signed)
Summary: f/u meds,df  pt left w/out being seen. had to meet her sister in [place]. the only question the pt had was, if she needs to stay on her HTN and cholesterol meds. i explained to the pt, that her meds maintain her blood pressure to a normal level and the cholesterol meds keep her cholesterol down as long as she is taking her meds. pt verbalized understanding and she will re-schedule. [name] CMA,  January 20, 2010 12:27 PM    Vital Signs:  Patient profile:   65 year old female Height:      57 inches Weight:      114 pounds BMI:     24.76 Temp:     97.8 degrees F oral Pulse rate:   80 / minute BP sitting:   106 / 64  (right arm) Cuff size:   regular  Vitals Entered By: Tessie Fass CMA (January 20, 2010 11:38 AM) CC: F/U meds Is Patient Diabetic? No Pain Assessment Patient in pain? no        CC:  F/U meds.  Habits & Providers  Alcohol-Tobacco-Diet     Tobacco Status: current     Tobacco Counseling: to quit use of tobacco products     Cigarette Packs/Day: 0.5  Allergies: 1)  Cipro 2)  Haldol (Haloperidol Lactate) 3)  * Toradol 4)  Trimox   Complete Medication List: 1)  Venlafaxine Hcl 75 Mg Tabs (Venlafaxine hcl) .... Take 1 tab two times a day 2)  Alendronate Sodium 70 Mg Tabs (Alendronate sodium) .[name].. 1 tab by mouth once a week 3)  Vitamin C 500 Mg Tabs (Ascorbic acid) .... Takes 4 every morning. 4)  Sm Calcium/vitamin D 500-200 Mg-unit Tabs (Calcium carbonate-vitamin d) .... Take 1 tablet by mouth twice a day 5)  Halfprin 162 Mg Tbec (Aspirin) .[name].. 1 tab by mouth daily 6)  Lisinopril 5 Mg Tabs (Lisinopril) .[name].. 1 tab by mouth daily for blood pressure 7)  Levothroid 75 Mcg Tabs (Levothyroxine sodium) .[name].. 1 tab by mouth daily 8)  Simvastatin 40 Mg Tabs (Simvastatin) .[name].. 1 tab by mouth at bedtime for cholesterol 9)  Ambien 10 Mg Tabs (Zolpidem tartrate) .... 1/4 tab by mouth at bedtime per [facility] 10)  Mobic 7.5 Mg Tabs (Meloxicam) .[name]..  1 tab by mouth daily for leg pain 11)  Omeprazole 20 Mg Cpdr (Omeprazole) .[name].. 1 tab by mouth daily 12)  Tylenol Extra Strength 500 Mg Tabs (Acetaminophen) .... Take 1-2 tabs every 6 hours as needed for pain-do not take more than 4 grams total of acetaminophen daily  Other Orders: No Charge Patient Arrived (NCPA0) (NCPA0)   Orders Added: 1)  No Charge Patient Arrived (NCPA0) [NCPA0]
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
## 2010-05-05 NOTE — Assessment & Plan Note (Signed)
Summary: sinus inf per pt/[name]/mayans   Vital Signs:  Patient profile:   65 year old female Weight:      114.9 pounds Temp:     98.2 degrees F Pulse rate:   104 / minute BP sitting:   114 / 78  (right arm)  Vitals Entered By: [name] CMA, (July 11, 2009 1:49 PM) CC: sinus pressure and congestion x 1 week. Is Patient Diabetic? No Pain Assessment Patient in pain? no        Primary Care Provider:  [name]  MD  CC:  sinus pressure and congestion x 1 week.[name]  History of Present Illness: Work-in patient with complaint of one-week of blurred vision, nasal congestion (worse on the L than the R naris), and some pain in the eyes.  Does not have the eye pain now.  Says it comes and goes.  No fevers or chills, no cough, some coryza is described. No changes in appetite.  Patient describes strong history of seasonal allergies which are worse in the spring and summer, better in Fall.  Has taken loratidine successfully in the past, but is not taking it any longer.   She quit smoking 3 months ago, still surrounded by many smokers in her building where she lives.   Denies chest pain or dyspnea.  Habits & Providers  Alcohol-Tobacco-Diet     Tobacco Status: quit  Current Medications (verified): 1)  Sm Calcium/vitamin D 500-200 Mg-Unit Tabs (Calcium Carbonate-Vitamin D) .... Take 1 Tablet By Mouth Twice A Day 2)  Venlafaxine Hcl 75 Mg Tabs (Venlafaxine Hcl) .... Take 1 Tab Three Times A Day 3)  Claritin 10 Mg Tabs (Loratadine) .[name].. 1 Tab By Mouth Daily As Needed 4)  Alendronate Sodium 70 Mg Tabs (Alendronate Sodium) .[name].. 1 Tab By Mouth Once A Week 5)  Synthroid 88 Mcg Tabs (Levothyroxine Sodium) .... One Tab By Mouth Daily 6)  Aspirin 325 Mg Tabs (Aspirin) .... Take 1/2 Tab Daily 7)  Promethazine Hcl 25 Mg Tabs (Promethazine Hcl) .... Take One Tab Every 6 Hr As Needed Nausea 8)  Loratadine 10 Mg Tabs (Loratadine) .[name].. 1 By Mouth One Time Daily  Allergies (verified): 1)  Cipro 2)   Haldol (Haloperidol Lactate) 3)  * Toradol 4)  Trimox (Amoxicillin)  Physical Exam  General:  well appearing, no apparent distress Eyes:  EOMI.  Small pupils which are round.  Conjunctivae with significant irritation and injection, thin watery secretion.  Ears:  External ear exam shows no significant lesions or deformities.  Otoscopic examination reveals clear canals, tympanic membranes are intact bilaterally without bulging, retraction, inflammation or discharge. Hearing is grossly normal bilaterally. Nose:  Blue-hued nasal mucus membranes with thin watery secretions.  No sinus tenderness (maxillary or frontal) Mouth:  Dentures. Moist mucus membranes, cobblestoning oropharynx without exudates.  Neck:  No deformities, masses, or tenderness noted. Lungs:  Normal respiratory effort, chest expands symmetrically. Lungs are clear to auscultation, no crackles or wheezes. Heart:  Normal rate and regular rhythm. S1 and S2 normal without gallop, murmur, click, rub or other extra sounds.   Impression & Recommendations:  Problem # 1:  ALLERGIC RHINITIS DUE TO POLLEN (ICD-477.0)  Patient with symptoms and findings that are best explained by seasonal allergies.  She is adamant about not using nasal steroid sprays, but is amenable to nasal saline rinses.  Will reinstate her daily claritin that she has used successfully in the past, as well as methods to cut down on pollen exposure to mucus  membranes. as-needed followup.  Orders: [facility]- Est Level  3 (40981)  Complete Medication List: 1)  Sm Calcium/vitamin D 500-200 Mg-unit Tabs (Calcium carbonate-vitamin d) .... Take 1 tablet by mouth twice a day 2)  Venlafaxine Hcl 75 Mg Tabs (Venlafaxine hcl) .... Take 1 tab three times a day 3)  Claritin 10 Mg Tabs (Loratadine) .[name].. 1 tab by mouth daily as needed 4)  Alendronate Sodium 70 Mg Tabs (Alendronate sodium) .[name].. 1 tab by mouth once a week 5)  Synthroid 88 Mcg Tabs (Levothyroxine sodium) .... One tab by  mouth daily 6)  Aspirin 325 Mg Tabs (Aspirin) .... Take 1/2 tab daily 7)  Promethazine Hcl 25 Mg Tabs (Promethazine hcl) .... Take one tab every 6 hr as needed nausea 8)  Loratadine 10 Mg Tabs (Loratadine) .[name].. 1 by mouth one time daily  Patient Instructions: 1)  It was a pleasure to see you today.  I believe your symptoms are related to seasonal allergies.  2)  I recommend you go back to taking the Claritin 10mg  tablets, one tablet daily, for the next few months.  3)  Daily washing of your face with cool water in the morning, and when you come in from the outside.  Likewise, using saline water to rinse your nose will help remove the pollen particles from your nasal passages.  Prescriptions: LORATADINE 10 MG TABS (LORATADINE) 1 by mouth one time daily  #30 x 6   Entered and Authorized by:   Paula Compton MD   Signed by:   Paula Compton MD on 07/11/2009   Method used:   [facility]ronically to        [facility] [address] [phone]* (retail)       [address]       [place], [place]  [address]       Ph: [phone] or [phone]       Fax: [phone]   RxID:   [id number]

## 2010-05-05 NOTE — Progress Notes (Signed)
  Phone Note Outgoing Call   Call placed by: Angelena Sole MD,  Aug 28, 2009 11:52 AM Summary of Call: Attempted to call patient and let her know that an exercise treadmill test would not be helpful since she is at low risk for coronary artery disease.

## 2010-05-05 NOTE — Miscellaneous (Signed)
  Clinical Lists Changes Needs CT with and without contrast for an initial scan to follow up lung nodule, not just CT without as initially ordered. Will need to get creatinine  please notify patieny of need for lab--order is in Thanks!  Denny Levy MD  December 23, 2009 9:15 AM  Problems: Added new problem of PULMONARY NODULE (ICD-518.89) Orders: Added new Test order of CT with & without Contrast (CT w&w/o Contrast) - Signed Added new Test order of Creatinine-[facility] ([id number]) - Signed      Complete Medication List: 1)  Venlafaxine Hcl 75 Mg Tabs (Venlafaxine hcl) .... Take 1 tab two times a day 2)  Alendronate Sodium 70 Mg Tabs (Alendronate sodium) .[name].. 1 tab by mouth once a week 3)  Vitamin C 500 Mg Tabs (Ascorbic acid) .... Takes 4 every morning. 4)  Sm Calcium/vitamin D 500-200 Mg-unit Tabs (Calcium carbonate-vitamin d) .... Take 1 tablet by mouth twice a day 5)  Halfprin 162 Mg Tbec (Aspirin) .[name].. 1 tab by mouth daily 6)  Lisinopril 5 Mg Tabs (Lisinopril) .[name].. 1 tab by mouth daily for blood pressure 7)  Levothroid 75 Mcg Tabs (Levothyroxine sodium) .[name].. 1 tab by mouth daily 8)  Simvastatin 40 Mg Tabs (Simvastatin) .[name].. 1 tab by mouth at bedtime for cholesterol 9)  Ambien 10 Mg Tabs (Zolpidem tartrate) .... 1/4 tab by mouth at bedtime per [facility] 10)  Mobic 7.5 Mg Tabs (Meloxicam) .[name].. 1 tab by mouth daily for leg pain 11)  Omeprazole 20 Mg Cpdr (Omeprazole) .[name].. 1 tab by mouth daily 12)  Tylenol Extra Strength 500 Mg Tabs (Acetaminophen) .... Take 1-2 tabs every 6 hours as needed for pain-do not take more than 4 grams total of acetaminophen daily

## 2010-05-05 NOTE — Miscellaneous (Signed)
Summary: Labs from [facility]  Clinical Lists Changes  Observations: Added new observation of PAST SURG HX: -Colonoscopy (hyperplastic polyps, sigmoid diverticulosis, int hemorroids) - 06/03/2005 - subretinacular ganglion removal - 12/04/2005 - T&A - 10/30/2004 - Thyroid Uptake (WNL 18.5 %) - 01/17/2006 -Radioiodine therapy 2 to R thyroid node w/hyperthyroidism on 04/29/06 by Dr Ane Payment -PFT's : obstructive pattern . Poor response to bronchodilators. 08/08 -s/p L partial thyroidectomy 5/10 by Dr. Bertram Savin -Echo 09/20/09: normal systolic function.  EF 55-60%, mild MR, atrial ok, trivial pericardial effusion -Carotid U/S 09/20/09: No significant extracranial carotid artery stenosis, vertebrals are patent with antegrade flow -MRI head 09/20/09: no acute intracranial abn.  Signal abnormality suggestive of chronic small vessel ischemia, maximal in the right subinsular white and deep gray mater.  (11/03/2009 9:29) Added new observation of PAST MED HX: -obsessive compulsive disorder  ( Behavioral health , Dr. Collene Schlichter) -depression major recurrent -anxiety -Weight loss abnormal ( hyperthyroidism 2 to R thyroid node treated on 01/08 plus uncontrolled depression) -PUD / H. pilory diagnosed on 06/08  -Diverticulosis  - GERD -COPD ( 2 to long hx of tobacco abuse)  - hx of R wrist subretinacular dorsal ganglion ( removed on 09/07) - Hx of Uterine prolapse (hysterectomy in 07/06)  Dr. Estanislado Pandy  - Hx of R thyroid nodule plus mild hyperthyroidism ( treated with radioiodino therapy on 01/08) Dr. Ane Payment -L thyroid biopsy 5/10 with atypia and Hurthle cells - s/p partial thyroidectomy 5/10 -Admitted 09/19/09: L sided weakness with imaging suggestive of chronic small vessel ischemia  (11/03/2009 9:29) Added new observation of PRIMARY Gillespie: [name] (11/03/2009 9:29) Added new observation of CREATININE: 0.80 mg/dL (16/01/9603 5:40) Added new observation of BUN: 13 mg/dL (98/02/9146 8:29) Added new observation of BG RANDOM:  90 mg/dL (56/21/3086 5:78) Added new observation of CO2 PLSM/SER: 28 meq/L (09/20/2009 9:29) Added new observation of CL SERUM: 103 meq/L (09/20/2009 9:29) Added new observation of K SERUM: 3.2 meq/L (09/20/2009 9:29) Added new observation of NA: 141 meq/L (09/20/2009 9:29) Added new observation of LDL: 166 mg/dL (46/96/2952 8:41) Added new observation of HDL: 57 mg/dL (32/44/0102 7:25) Added new observation of TRIGLYC TOT: 154 mg/dL (36/64/4034 7:42) Added new observation of TSH: 91.266 microintl units/mL (09/19/2009 9:29)       Past History:  Past Medical History: -obsessive compulsive disorder  ( Behavioral health , Dr. Collene Schlichter) -depression major recurrent -anxiety -Weight loss abnormal ( hyperthyroidism 2 to R thyroid node treated on 01/08 plus uncontrolled depression) -PUD / H. pilory diagnosed on 06/08  -Diverticulosis  - GERD -COPD ( 2 to long hx of tobacco abuse)  - hx of R wrist subretinacular dorsal ganglion ( removed on 09/07) - Hx of Uterine prolapse (hysterectomy in 07/06)  Dr. Estanislado Pandy  - Hx of R thyroid nodule plus mild hyperthyroidism ( treated with radioiodino therapy on 01/08) Dr. Ane Payment -L thyroid biopsy 5/10 with atypia and Hurthle cells - s/p partial thyroidectomy 5/10 -Admitted 09/19/09: L sided weakness with imaging suggestive of chronic small vessel ischemia  Past Surgical History: -Colonoscopy (hyperplastic polyps, sigmoid diverticulosis, int hemorroids) - 06/03/2005 - subretinacular ganglion removal - 12/04/2005 - T&A - 10/30/2004 - Thyroid Uptake (WNL 18.5 %) - 01/17/2006 -Radioiodine therapy 2 to R thyroid node w/hyperthyroidism on 04/29/06 by Dr Ane Payment -PFT's : obstructive pattern . Poor response to bronchodilators. 08/08 -s/p L partial thyroidectomy 5/10 by Dr. Bertram Savin -Echo 09/20/09: normal systolic function.  EF 55-60%, mild MR, atrial ok, trivial pericardial effusion -Carotid U/S 09/20/09: No  significant extracranial carotid artery stenosis,  vertebrals are patent with antegrade flow -MRI head 09/20/09: no acute intracranial abn.  Signal abnormality suggestive of chronic small vessel ischemia, maximal in the right subinsular white and deep gray mater.

## 2010-05-05 NOTE — Assessment & Plan Note (Signed)
Summary: DNKA      Current Allergies: CIPRO HALDOL (HALOPERIDOL LACTATE) * TORADOL TRIMOX (AMOXICILLIN) CIPRO HALDOL (HALOPERIDOL LACTATE) * TORADOL TRIMOX (AMOXICILLIN)        Complete Medication List: 1)  Fosamax 70 Mg Tabs (Alendronate sodium) .... Take 1 tablet by mouth once a week 2)  Lexapro 20 Mg Tabs (Escitalopram oxalate) .... Take 1 tablet by mouth once a day 3)  Sm Calcium/vitamin D 500-200 Mg-unit Tabs (Calcium carbonate-vitamin d) .... Take 1 tablet by mouth twice a day 4)  Fosamax 70 Mg Tabs (Alendronate sodium) .... Take 1 tablet by mouth once a week 5)  Lexapro 20 Mg Tabs (Escitalopram oxalate) .... Take 1 tablet by mouth once a day 6)  Sm Calcium/vitamin D 500-200 Mg-unit Tabs (Calcium carbonate-vitamin d) .... Take 1 tablet by mouth twice a day 7)  Naprosyn 250 Mg Tabs (Naproxen) .[name].. 1 tab by mouth two times a day. 8)  Omeprazole 40 Mg Cpdr (Omeprazole) .[name].. 1 tab by mouth daily 9)  Flonase 50 Mcg/act Susp (Fluticasone propionate) .... 2 sprays each nostril daily 10)  Cetirizine Hcl 10 Mg Tabs (Cetirizine hcl) .[name].. 1 tab daily    ]
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
## 2010-05-05 NOTE — Therapy (Signed)
Summary: [facility]  [facility]   Imported By: Alphonzo Lemmings KIVETT 11/17/2007 13:55:41  _____________________________________________________________________  External Attachment:    Type:   Image     Comment:   External Document

## 2010-05-05 NOTE — Progress Notes (Signed)
Summary: Triage  Phone Note Call from Patient Call back at [phone]   Reason for Call: Talk to Nurse Summary of Call: wants to be seen this afternoon for a sore throat. Initial call taken by: Haydee Salter,  January 23, 2008 10:41 AM  Follow-up for Phone Call        Pt states her throat feels swollen.  Feels like she is choking at times.  She is breathing fine, this has been going on for several days, but swelling is worsening.  Scheduled her for appt with Dr. Georgiana Shore at 1:30 today. Follow-up by: AMY MARTIN RN,  January 23, 2008 11:20 AM

## 2010-05-05 NOTE — Progress Notes (Signed)
Summary: phn msg  Phone Note Call from Patient Call back at Home Phone [phone]   Caller: Patient Summary of Call: Pt asking if she can get a printout of what they found while she was in the hospital.  She trying to find out of she had blocked artries in her neck. Initial call taken by: Clydell Hakim,  October 24, 2009 12:14 PM  Follow-up for Phone Call        will fwd. to dr.Rudra Hobbins to address. Follow-up by: [name] CMA,,  October 24, 2009 4:03 PM  Additional Follow-up for Phone Call Additional follow up Details #1::        Pt can go to medical record at [facility] to get results of all tests done at this hospitalization.  Additional Follow-up by: Danyale Ridinger MD,  October 24, 2009 4:22 PM

## 2010-05-05 NOTE — Miscellaneous (Signed)
Summary: walk in  Clinical Lists Changes came in c/o L foot numbness x "several months".  worse when she sits & crosses her legs. has had her "blood flow tested & it was good". denies problems walking. feels better when she wears sneakers which have more support than her sandals she has on today. skin warm & dry. normal color. strong pedal pulse felt.  has started back on synthroid 75mg  when she was in the hospital last week. has appt thursday with pcp. states she will come then. asked her to bring all med bottles with her to appt. call back if numbness gets worse, causes falls or extends past her foot. she agreed with plan.[place]en Circle RN  October 28, 2009 12:23 PM

## 2010-05-05 NOTE — Assessment & Plan Note (Signed)
Summary: FU/KH  Medications Added FOSAMAX 70 MG TABS (ALENDRONATE SODIUM) Take 1 tablet by mouth once a week LEXAPRO 20 MG TABS (ESCITALOPRAM OXALATE) Take 1 tablet by mouth once a day SM CALCIUM/VITAMIN D 500-200 MG-UNIT TABS (CALCIUM CARBONATE-VITAMIN D) Take 1 tablet by mouth twice a day      Allergies Added: CIPRO (CIPROFLOXACIN) HALDOL (HALOPERIDOL LACTATE) * TORADOL TRIMOX (AMOXICILLIN)  Vital Signs:  Patient Profile:   65 Years Old Female Weight:      101 pounds Pulse rate:   91 / minute BP sitting:   94 / 71  Vitals Entered By: Lillia Pauls CMA (November 18, 2006 2:13 PM)               PCP:  Jackalyn Lombard MD   History of Present Illness: 65 yo WF with hx of  ~ 10 year of tobacco abuse ( 1 ppd) , currently non-smoker , was diagnosed in early August at [facility] with COPD. She was treated for COPD exacerbation with zIthromax, prednisone, atrovent/ albuterol. A chest xray on 08/07 showed:E mphysema.  Chronic scarring.  No active disease or   Change since previous exam.  Pt does not c/o SOB. She does have hx of dry night cough " for a long time". She reports some night sweats, and there was documented weight lost. The chest xray did not showed mass , hiliar adenopathy or signs of active TB or scar process 2 to TB.   - Weight lost: After extensive work up and endocrinologyst referral, abnormal weight loss 2 to Hyperthyroidsm 2 to functioning R thyroid nodule  (Pt received radioiodine therapy on 04/29/06) plus uncontrolled depression  ( f/u by [facility]) , and PUD with H. pylori test (+).  Pt's weight was as low as 89 lbs , currently 101.  Weight has improved after better controlled depression , treatment of thyroid nodule ,and treatment of H. pilory with improvement of PUD. Her apetite has improved remarkably.   Current Allergies: CIPRO (CIPROFLOXACIN) HALDOL (HALOPERIDOL LACTATE) * TORADOL TRIMOX (AMOXICILLIN)      Physical Exam  General:     Thin female ,in no acute  distress; alert,appropriate and cooperative throughout examination Lungs:     normal respiratory effort, no intercostal retractions, no accessory muscle use, no dullness, no fremitus, no crackles, and no wheezes.  Decreased BS through out.  Heart:     Normal rate and regular rhythm. S1 and S2 normal without gallop, murmur, click, rub or other extra sounds. Abdomen:     Bowel sounds positive,abdomen soft and non-tender without masses, organomegaly or hernias noted. Pulses:     2 + peripheral pulses Extremities:     no edema    Impression & Recommendations:  Problem # 1:  COPD (ICD-496) Given this pt's chest xray findings and hx of tobacco abuse , I will request PFT for baseline and response to bronchodilators.  After PFT's reviewed , I will start tx for COPD. Dry cough at night seems to be most likely to GERD. Pt is to continue PPI and was instructed with reflux precautions. F/u in 1 months .  Orders: PFT Baseline-Pre/Post Bronchodiolator (PFT Baseline-Pre/Pos) PFT's Baseline w/ DLCO (PFT's Baseline-DLCO) PFT Basline & Lung Volume (PFT Baseline-Lung  V) [facility]- Est Level  3 (16073)   Problem # 2:  WEIGHT LOSS, ABNORMAL (ICD-783.21) Apetite improved after H. pilory treatment( Pt has  gained 13 LBS). Samples of ensure given. F/u in 1 month.  Problem # 3:  Preventive Health Care (ICD-V70.0) lipid  panel for cholesterol screening pending  Complete Medication List: 1)  Fosamax 70 Mg Tabs (Alendronate sodium) .... Take 1 tablet by mouth once a week 2)  Lexapro 20 Mg Tabs (Escitalopram oxalate) .... Take 1 tablet by mouth once a day 3)  Sm Calcium/vitamin D 500-200 Mg-unit Tabs (Calcium carbonate-vitamin d) .... Take 1 tablet by mouth twice a day  Other Orders: Future Orders: Lipid-[facility] ([id number]) ... 11/15/2007   Patient Instructions: 1)  APPT FOR  PFT'S AT [place] [place] ON 8.28.08 AT 10AM ON THURS. REGISTER IN ADMITTING AT 9:45AM FOR THE APPT
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
## 2010-05-05 NOTE — Progress Notes (Signed)
Summary: update on medication  Phone Note Call from Patient Call back at Home Phone [phone]   Reason for Call: Talk to Doctor Summary of Call: pt sts she was told to call and let md know how the medication, alendronate was doing, she sts it made her ill, pt sts she has also been hurting in her shoulder blades....she sts she can barely move it. Initial call taken by: ERIN LEVAN,  January 30, 2007 11:38 AM  Follow-up for Phone Call        If shoulder pain continues and does not relieve with Tylenol / motrin she needs app.  Follow-up by: Jackalyn Lombard MD,  February 01, 2007 11:23 AM

## 2010-05-05 NOTE — Miscellaneous (Signed)
Summary: outside labs  Clinical Lists Changes  Observations: Added new observation of HELICOB IGG: 0.7  (12/27/2007 17:14) Added new observation of PLATELETK/UL: 281 K/uL (12/27/2007 17:14) Added new observation of RDW: 12.9 % (12/27/2007 17:14) Added new observation of MCHC RBC: 32.7 g/dL (16/01/9603 54:09) Added new observation of MCV: 97.5 fL (12/27/2007 17:14) Added new observation of HCT: 38.8 % (12/27/2007 17:14) Added new observation of HGB: 12.7 g/dL (81/19/1478 29:56) Added new observation of RBC M/UL: 3.98 M/uL (12/27/2007 17:14) Added new observation of WBC COUNT: 5.5 10*3/microliter (12/27/2007 17:14)

## 2010-05-05 NOTE — Progress Notes (Signed)
Summary: ENT  Phone Note Call from Patient Call back at Home Phone [phone]   Summary of Call: She thinks one reason as to why she doesn't get enough oxygen in her lungs is because something is wrong with the left side of her nose according to Dr. Jearld Fenton (ENT).  Initial call taken by: Haydee Salter,  December 02, 2006 4:25 PM  Follow-up for Phone Call        I have a long discussion with this pt about this issue already.  If she has more questions, I will be glad to unswer them in the next OV Follow-up by: Jackalyn Lombard MD,  December 02, 2006 5:46 PM

## 2010-05-05 NOTE — Assessment & Plan Note (Signed)
Summary: lump in lymph node/neck pain   Vital Signs:  Patient Profile:   65 Years Old Female Weight:      93 pounds Temp:     97.0 degrees F Pulse rate:   80 / minute BP sitting:   112 / 71  Pt. in pain?   yes    Location:   lymph node-neck    Intensity:   10  Vitals Entered By: Danella Sensing CMA (June 22, 2006 1:54 PM)              Is Patient Diabetic? No   History of Present Illness: Pt with a known history of posterior cervical lymph node.  Comes in complainig about it again.  says that it seems to be getting bigger.  She has been evaluated for this by endocrinology and by her PCP and it it thought to be incidental and of no consequence.  Pt is convinced it is cancer that has spread from her thyroid, although she has never been diagnose with thyroid cancer. she does have history of hyperthyroid and is curretnly on PTU treatment      Risk Factors:  Tobacco use:  quit    Physical Exam  General:     Well-developed,well-nourished,in no acute distress; alert,appropriate and cooperative throughout examination Head:     Normocephalic and atraumatic without obvious abnormalities. No apparent alopecia or balding. Neck:     No deformities, masses, or tenderness noted. Cervical Nodes:     L posterior LN enlarged smaller than a pea.no anterior cervical adenopathy and L posterior LN enlarged.   Psych:     Oriented X3, moderately anxious, and poor concentration.      Impression & Recommendations:  Problem # 1:  SYMPTOM, ENLARGEMENT, LYMPH NODES (ICD-785.6) Assessment: Unchanged According to previous notes lymph node does not seem any larger. I assured pt that she has not been diagnosed with thyroid cancer according to the  endocrinologists notes and that he is not concerned about the lymph node.  she has been advised to not continue touching it as to avoid soreness.  She will keep her appointments with endocrine and Dr. Ludwig Clarks in the coming weeks. Orders: [facility]-  Est Level  3 (06301)    Patient Instructions: 1)  Please schedule an appointment with your primary doctor as directed :

## 2010-05-05 NOTE — Assessment & Plan Note (Signed)
Summary: concerned about lump on l side of neck   Vital Signs:  Patient Profile:   65 Years Old Female Weight:      100.8 pounds Temp:     98.0 degrees F Pulse rate:   83 / minute BP sitting:   106 / 71  Pt. in pain?   yes    Location:   left side    Intensity:   4  Vitals Entered By: Altamese Dilling CMA, (October 13, 2006 9:46 AM)                Chief Complaint:  Check lump on left side of neck.  History of Present Illness: 65 yo WF with hx of obsessive-compulsive disorder, anxiety and depression c/o lump on the left side of the neck. Pt states that she has an URI for a few days and she has felt "a lump" on the left side of the neck also a few days a go . Today she actually doesn't feel it anymore.  Pt looks anxious. She actually has gained 10 lbs since  her last visit in 04/2006.  Chera has c/o enlarged lymph node in the past . She actually though that it was CA from her thyroid gland  , although she has never been diagnosed with Thyroid CA. She did have a diagnosis of R thyroid gland node with normal Iodine uptake but causing   mild hyperthyroidism 2 to funtion depression of the rest of the gland. Pt received radioiodine therapy on 01/25 /08 by dr. Ane Payment.    Past Medical History:    -L side neck liymph node enlargement         -obsessive compulsive disorder  ( Behavioral healt , Dr. Abbe [place])    -depression major recurrent    -anxiety        -Weight loss abnormal ( hyperthyroidism 2 to R thyroid node treated on 01/08 plus uncontrolled depression)        -PUD / H. pilory diagnosed on 06/08 ( awaiting GI med recs)    -Diverticulosis of colon    - GERD        - hx of R wrist subretinacular dorsal ganglion ( removed on 09/07)    - Hx of Uterine prolapse (hysterectomy in 07/06)    - Hx of R thyroid nodule plus mild hyperthyroidism ( treated with radioiodino therapy on 01/08) Dr. Ane Payment  Past Surgical History:    -Colonoscopy (hyperplastic polyps, sigmoid  diverticulosis, int hemorroids) - 06/03/2005    - subretinacular ganglion removal - 12/04/2005    - T&A - 10/30/2004    - Thyroid Uptake (WNL 18.5 %) - 01/17/2006    -Radioiodine therapy 2 to R thyroid node w/hyperthyroidism on 04/29/06 by Dr Ane Payment      Physical Exam  General:     anxious, thin , disheveled woman. Head:     Normocephalic and atraumatic without obvious abnormalities.  Ears:     External ear exam shows no significant lesions or deformities.  Otoscopic examination reveals clear canals, tympanic membranes are intact bilaterally without bulging, retraction, inflammation or discharge. Hearing is grossly normal bilaterally. Nose:     External nasal examination shows no deformity or inflammation. Nasal mucosa are pink and moist without lesions or exudates. Mouth:     pharyngeal erythema, no exudates. Multiples cavities and missing upper and lower teeth. Neck:     No deformities, masses, or tenderness noted. No lymph node enlargement Lungs:     Normal respiratory effort,  chest expands symmetrically. Lungs are clear to auscultation, no crackles or wheezes. Heart:     Normal rate and regular rhythm. S1 and S2 normal without gallop, murmur, click, rub or other extra sounds. Abdomen:     Bowel sounds positive,abdomen soft and non-tender without masses, organomegaly or hernias noted. Pulses:     R and L radial,dorsalis pedis and posterior tibial pulses are full and equal bilaterally Extremities:     No clubbing, cyanosis, edema, or deformity noted with normal full range of motion of all joints.   Skin:     Intact without suspicious lesions or rashes Psych:     Oriented X3 and slightly anxious.      Impression & Recommendations:  Problem # 1:  SYMPTOM, ENLARGEMENT, LYMPH NODES (ICD-785.6) Actually no enlarged lymph node was palpated ( pt was feeling the cricoid cartilage on her L side) . I have explain and reassured . Pt understood that she was palpating normal anatomy and it  is not cancer what she has. Orders: Comp Met-[facility] [id number]) [facility]- Est Level  3 (09811)   Problem # 2:  WEIGHT LOSS, ABNORMAL (ICD-783.21) After extensive work up and endocrinologyst referral, abnormal weight loss 2 to Hyperthyroidsm 2 to functioning R thyroid nodule plus uncontrolled depression. Pt received radioiodine therapy on 04/29/06. Since then she has gained 10 lbs with apetite improvement.  Advice to have 3 meals and  3 snacks daily.  Shenaya was diagnosed with H. pilory and PUD. She has completed ATB for H. pilory and has f/u visit with GI next week. Pt states that apetite has improved after H. pilory treatment was completed. She is to request records from GI office to be faxed to me. F/u in 1 month. Orders: Comp Met[facility] [id number]) [facility]- Est Level  3 ([id number])    Patient Instructions: 1)  Continue eating 3 meals ( breakfast, lunch, dinner) and 3 snacks a day. Balance diet ( meat , fruit , vegetables).  2)  Ask Gastroenterologyst to send records to Dr. Ludwig Clarks 3)  Please schedule a follow-up appointment in 1 month for f/u weight

## 2010-05-05 NOTE — Progress Notes (Signed)
Summary: Dental Clinic  Phone Note Call from Patient Call back at [phone]   Reason for Call: Talk to Nurse Summary of Call: needs phone number to the adult dental clinic Initial call taken by: Haydee Salter,  January 08, 2008 2:22 PM  Follow-up for Phone Call        NO ANSWERING MACHINE. WILL WAIT FOR PT TO VALL AGAIN Follow-up by: Alphia Kava,  January 08, 2008 4:00 PM  Additional Follow-up for Phone Call Additional follow up Details #1::        NO ANSWERING MACHINE. WILL WAIT FOR PT TO VALL AGAIN Additional Follow-up by: Alphia Kava,  January 09, 2008 9:51 AM    Additional Follow-up for Phone Call Additional follow up Details #2::    returning call Follow-up by: Haydee Salter,  January 09, 2008 9:57 AM  Additional Follow-up for Phone Call Additional follow up Details #3:: Details for Additional Follow-up Action Taken: pt given the phone number for [facility] Additional Follow-up by: Alphia Kava,  January 09, 2008 11:07 AM

## 2010-05-05 NOTE — Progress Notes (Signed)
  Phone Note Call from Patient   Caller: Patient Summary of Call: Please call pt back.  She was given prednisone and it's making her chest feel more tighter than it was.   Initial call taken by: Britta Mccreedy mcgregor  Follow-up for Phone Call        states she is not going to take it as her face is now redder than usual & her chest got tight.  later in conversation says she does feel better today. took it yesterday at 2:30. advised taking it again today. stays she is very worried. told her it may take more than 1 pill to make a big difference. she wants antibiotics. says she gets bronchitis easily. told her I will send this request to md who saw her. she agreed to take a pill today.  told her I will call her with md response  Follow-up by: Golden Circle RN,  December 03, 2009 11:57 AM  Additional Follow-up for Phone Call Additional follow up Details #1::        she called back worried about what she will do if she does not get better. wants to be seen. placed in clinic with Dr. Mauricio Po at 2 pt has hx of anxiety & my efforts to reassure her did not work Additional Follow-up by: Golden Circle RN,  December 03, 2009 12:08 PM

## 2010-05-05 NOTE — Miscellaneous (Signed)
Summary: Call regarding dizziness  Clinical Lists Changes [name] is concerned about her dizziness.  Please call her tonight or tomorrow with info about that. Abundio Miu  October 30, 2009 3:24 PM

## 2010-05-05 NOTE — Progress Notes (Signed)
Summary: triage  Phone Note Call from Patient Call back at Home Phone [phone]   Caller: Patient Summary of Call: Can she be seen today for a sinus infection? Initial call taken by: Clydell Hakim,  July 11, 2009 11:22 AM  Follow-up for Phone Call        has been sick x 1 wk. appt made for 1:30 work in. aware of wait Follow-up by: Golden Circle RN,  July 11, 2009 11:32 AM

## 2010-05-05 NOTE — Assessment & Plan Note (Signed)
Summary: PAIN TO CHEST AND BACK [facility]   Vital Signs:  Patient Profile:   65 Years Old Female Height:     57 inches (144.78 cm) Weight:      102 pounds (46.36 kg) BMI:     22.15 Temp:     98.3 degrees F (36.83 degrees C) Pulse rate:   74 / minute BP sitting:   129 / 73  (right arm)  Pt. in pain?   no  Vitals Entered By: Tomasa Rand (December 02, 2006 2:02 PM)                  PCP:  Jackalyn Lombard MD  Chief Complaint:  Pt c/o pain to chest and back area three days ago and feeling better today.[name]  History of Present Illness:  65 yo female with hx of obsesive compulsive disorder, anxiety, depression  c/o pain on the right lower chest and muscles between the R scapular and spine are.  It is very important to clarify that pt is not concern at all about this pain.  She does not have any pain today. IN fact Sherisa made the appointmet to talk with me about her sister liver dz. Avary is upset and believes that her sister is going to die.  As usual , Maelle reads a lot about medical issues and then believes that something like that is happening to her.  Pt spent 10 minutes asking questions and talking about her sister. I had to stopp her and re direct her to her own complain. Pt states that she has been having this type of muscular pain on and off for the last 2 years . She believes that pain is triggered by arguments with her sister and stress. She does not have any pain today and really it is not interested in discuss about it. "Pain  comes and go, 2 to stress" she sayd.  " ruber band" like : from  R lower chest to the back". No  n,v, d, sob, sweating., palpitations, substernal pain. She only has tryed  low dose ASA when appears but  does not help.  Relief by Lying done and stretching .  She used to [facility] to [facility] for [facility]hab and is asking me to consider a referral.  She said: rebah my relieve my  Chest wall pain.    Current Allergies: CIPRO (CIPROFLOXACIN) HALDOL (HALOPERIDOL LACTATE) *  TORADOL TRIMOX (AMOXICILLIN) CIPRO (CIPROFLOXACIN) HALDOL (HALOPERIDOL LACTATE) * TORADOL TRIMOX (AMOXICILLIN)  Past Medical History:    Reviewed history from 10/13/2006 and no changes required:       -L side neck liymph node enlargement               -obsessive compulsive disorder  ( Behavioral healt , Dr. Abbe Amsterdam)       -depression major recurrent       -anxiety              -Weight loss abnormal ( hyperthyroidism 2 to R thyroid node treated on 01/08 plus uncontrolled depression)              -PUD / H. pilory diagnosed on 06/08 ( awaiting GI med recs)       -Diverticulosis of colon       - GERD              -COPD ( 2 to long hx of tobacco abuse, quitted years ago)               -  hx of R wrist subretinacular dorsal ganglion ( removed on 09/07)       - Hx of Uterine prolapse (hysterectomy in 07/06)       - Hx of R thyroid nodule plus mild hyperthyroidism ( treated with radioiodino therapy on 01/08) Dr. Ane Payment  Past Surgical History:    Reviewed history from 10/13/2006 and no changes required:       -Colonoscopy (hyperplastic polyps, sigmoid diverticulosis, int hemorroids) - 06/03/2005       - subretinacular ganglion removal - 12/04/2005       - T&A - 10/30/2004       - Thyroid Uptake (WNL 18.5 %) - 01/17/2006       -Radioiodine therapy 2 to R thyroid node w/hyperthyroidism on 04/29/06 by Dr Ane Payment              -PFT's : obstructive pattern . Poor response to bronchodilators. 08/08      Physical Exam  General:     alert, disheveled, and poor hygiene. Anxious. Talks a lot about her sister issues. Needs re director to actually discuss cc during OV Chest Wall:     no deformities, no tenderness, and no mass.   Lungs:     Normal respiratory effort, chest expands symmetrically. Lungs are clear to auscultation, no crackles or wheezes. Heart:     Normal rate and regular rhythm. S1 and S2 normal without gallop, murmur, click, rub or other extra sounds. Abdomen:     Bowel sounds  positive,abdomen soft and non-tender without masses, organomegaly or hernias noted. Pulses:     2 + peripheral pulses Extremities:     no edema    Impression & Recommendations:  Problem # 1:  CHEST WALL PAIN, ANTERIOR (ICD-786.52) Not present during OV. MOst likely related to chest wall pain. No symptoms of cardiac origin. IN fact , pt has not had this pain for a while. She  made the appointment to talk about her sister liver dz.  Her obsesive compulsive disorder plus anxiety and the fact that she feels that I'm a good listener drives her to schedule app. just to talk. Pt will take ibuprofen 400 mg q 4 to 6 hours prn  with food to avoid stomach damage.  Orders: [facility]- Est Level  3 (16109)   Problem # 2:  COPD (ICD-496) I have discussed today extensively PFT's results . Pt is currently asymptomatic and does not need tx ( discussed with Dr. Jennette Kettle)   Complete Medication List: 1)  Fosamax 70 Mg Tabs (Alendronate sodium) .... Take 1 tablet by mouth once a week 2)  Lexapro 20 Mg Tabs (Escitalopram oxalate) .... Take 1 tablet by mouth once a day 3)  Sm Calcium/vitamin D 500-200 Mg-unit Tabs (Calcium carbonate-vitamin d) .... Take 1 tablet by mouth twice a day 4)  Fosamax 70 Mg Tabs (Alendronate sodium) .... Take 1 tablet by mouth once a week 5)  Lexapro 20 Mg Tabs (Escitalopram oxalate) .... Take 1 tablet by mouth once a day 6)  Sm Calcium/vitamin D 500-200 Mg-unit Tabs (Calcium carbonate-vitamin d) .... Take 1 tablet by mouth twice a day   Patient Instructions: 1)  Take ibuprofen 400 mg by mouth every 4 to 6 hours for pain as needed with food to avoid stomach damage 2)  Please schedule a follow-up appointment in 1 month for weight and chest pain

## 2010-05-05 NOTE — Assessment & Plan Note (Signed)
Summary: discomfort under right breast and at times under left breast ...   Vital Signs:  Patient Profile:   65 Years Old Female Height:     57 inches (144.78 cm) Weight:      112.5 pounds Temp:     98.2 degrees F Pulse rate:   78 / minute BP sitting:   123 / 75  (left arm)  Pt. in pain?   yes    Location:   right rib area      Intensity:   5    Type:       aching  Vitals Entered By: Theresia Lo RN (May 02, 2008 1:52 PM)              Is Patient Diabetic? No     PCP:  [name]  MD  Chief Complaint:  pain right rib area and under right arm and around to center of back earlier today. off and on pain under both breast at timesfor past month.  History of Present Illness: Patient presents complaining of chest wall pain.  She reports she has had pain and a "muscle knot" on her right lateral chest wall for as long as she can remember.  The pain worsens with stress.  It is sharp and spasms.  Recently (she cannot quantify) she has felt similar pain underneath her bilateral breasts.  She thinks sometimes it may radiate from the muscle knot.  She is worried the mold/mildew in her house has given her lung cancer.  The pain is worse with certain movements, hurts to press on.  No shortness of breath or cough.  Pain not worse with exertion and not relieved by rest.  She also notes she has been hoarse for a long time.  She reports her sinuses feel like they are going to explode.  No fevers, no chills.  Unsure if she has drainage or not.      Current Allergies: CIPRO HALDOL (HALOPERIDOL LACTATE) * TORADOL TRIMOX (AMOXICILLIN) CIPRO HALDOL (HALOPERIDOL LACTATE) * TORADOL TRIMOX (AMOXICILLIN)      Physical Exam  General:     alert and cooperative to examination.   Ears:     External ear exam shows no significant lesions or deformities.  Otoscopic examination reveals clear canals, tympanic membranes are intact bilaterally without bulging, retraction, inflammation or  discharge. Hearing is grossly normal bilaterally. Nose:     no external deformity, no external erythema, no nasal discharge, and no sinus percussion tenderness.   Mouth:     pharynx mildly erythematous, post nasal drainage noted Chest Wall:     tender to palpation over sternum, along ribs below bilateral breasts, and right lateral chest wall Lungs:     Normal respiratory effort, chest expands symmetrically. Lungs are clear to auscultation, no crackles or wheezes. Heart:     Normal rate and regular rhythm. S1 and S2 normal without gallop, murmur, click, rub or other extra sounds.    Impression & Recommendations:  Problem # 1:  CHEST PAIN (ICD-786.50) Assessment: Deteriorated consistent with chest wall pain of musculoskeletal origin.  reassured her that lung cancer does not present like this, and mold and mildew won't give her lung cancer. Orders: [facility]- Est Level  3 (16109)   Problem # 2:  HOARSENESS (UEA-540.98) Assessment: Unchanged likely due to allergies, chronic sinusitis and post nasal drainage.  recommended claritin, flonase, neti pot Orders: [facility]- Est Level  3 ([id number])   Complete Medication List: 1)  Fosamax 70 Mg Tabs (Alendronate  sodium) .... Take 1 tablet by mouth once a week 2)  Lexapro 20 Mg Tabs (Escitalopram oxalate) .... Take 1 tablet by mouth once a day 3)  Sm Calcium/vitamin D 500-200 Mg-unit Tabs (Calcium carbonate-vitamin d) .... Take 1 tablet by mouth twice a day 4)  Fosamax 70 Mg Tabs (Alendronate sodium) .... Take 1 tablet by mouth once a week 5)  Lexapro 20 Mg Tabs (Escitalopram oxalate) .... Take 1 tablet by mouth once a day 6)  Sm Calcium/vitamin D 500-200 Mg-unit Tabs (Calcium carbonate-vitamin d) .... Take 1 tablet by mouth twice a day 7)  Omeprazole 40 Mg Cpdr (Omeprazole) .[name].. 1 tab by mouth daily 8)  Flonase 50 Mcg/act Susp (Fluticasone propionate) .... 2 sprays each nostril daily 9)  Cetirizine Hcl 10 Mg Tabs (Cetirizine hcl) .[name].. 1 tab daily    Patient Instructions: 1)  Keep your appointment with Dr. Georgiana Shore next week. 2)  I do not believe your pain is coming from your heart or lungs--I believe it is all muscle spasm and soreness from stress. 3)  Try loratadine 10 mg daily for your sinuses. 4)  Also reconsider trying flonase. 5)  You can also try a neti pot (at the pharmacy) which can clean out your sinuses naturally with saline. 6)  Avoid afrin nasal spray
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
## 2010-05-05 NOTE — Miscellaneous (Signed)
  Clinical Lists Changes  Discussed case with Dr. Jennette Kettle.  She thought that patient was at low risk for CAD and that an ETT would not benefit patient.

## 2010-05-05 NOTE — Progress Notes (Signed)
Summary: Triage  Phone Note Call from Patient Call back at Home Phone [phone]   Caller: Patient Summary of Call: depression. Initial call taken by: Clydell Hakim,  September 27, 2008 11:41 AM  Follow-up for Phone Call        had a death in family last week. her aunt's husband was killed on 29 in MVA. "I feel no joy"states she was close to him. he would have been 88 this coming 09/02/22. His wife died 2 years ago.   "I don't have the motivation to fix up" "sick of being this way" "what is the point of living" needs something else. willing to try prozac. open to idea of therapy. gave her Dr. Carola Rhine number.  she has appt Tues next week with Dr. Georgiana Shore. denies SI or Hi. states she is making herself eat. agreed to not harm self & go to nearest ED if she fwelt this way Follow-up by: Golden Circle RN,  September 27, 2008 12:00 PM  Additional Follow-up for Phone Call Additional follow up Details #1::        Talked to patient.  She wants to be seen next week.  I don't have a new patient opening for three weeks.  She elected to take the name of [name].  She will call back if that doesn't work out. Additional Follow-up by: Spero Geralds PsyD,  September 27, 2008 12:19 PM
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
## 2010-05-05 NOTE — Progress Notes (Signed)
Summary: pls call before pt comes in/CALLED/TS  Phone Note From Other Clinic Call back at [phone]   Caller: Letha Cape Summary of Call: needs to talk to Dr Georgiana Shore before she sees her 7/8 she will be free before 9am or 11&12:30 Initial call taken by: De Nurse,  October 09, 2008 4:27 PM  Follow-up for Phone Call        Called and left message to call us back. Follow-up by: Modesta Messing LPN,  October 10, 4538 4:57 PM  Additional Follow-up for Phone Call Additional follow up Details #1::        called M.Media planner (Child psychotherapist). she states 'pt is psychotic, paranoid and needs close pschyological follow up' I asked M.Charlyne Mom how many times she had interactions with the pt, she said 'only once' will fwd. to Dr.Debbe Crumble for information/review Additional Follow-up by: [name] CMA,,  October 10, 2008 11:44 AM

## 2010-05-05 NOTE — Progress Notes (Signed)
Summary: Inquire about beh-med services  Phone Note Call from Patient   Caller: Patient Call For: Spero Geralds, Psy.D. Summary of Call: Patient called to schedule an appointment but thought that I would be taking over her medication.  I clarified that and she seemed disappointed.  From Dr. Georgiana Shore note, it appeared she was interested in counseling and had found it helpful in the past.  I shared that piece of Dr. Roslynn Amble assessment and the patient seemed to agree but remained ambivalent.  She will call back if she wants to schedule. Initial call taken by: Spero Geralds PsyD,  November 07, 2007 4:46 PM
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
## 2010-05-05 NOTE — Procedures (Signed)
Summary: [facility]   Imported By: De Nurse 07/17/2008 08:31:27  _____________________________________________________________________  External Attachment:    Type:   Image     Comment:   External Document
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
## 2010-05-05 NOTE — Assessment & Plan Note (Signed)
Summary: read ppd/bmc  Nurse Visit    Prior Medications: SM CALCIUM/VITAMIN D 500-200 MG-UNIT TABS (CALCIUM CARBONATE-VITAMIN D) Take 1 tablet by mouth twice a day FLONASE 50 MCG/ACT  SUSP (FLUTICASONE PROPIONATE) 2 sprays each nostril daily AMOXICILLIN 500 MG TABS (AMOXICILLIN) 1 tab by mouth two times a day for 10 days CITALOPRAM HYDROBROMIDE 20 MG TABS (CITALOPRAM HYDROBROMIDE) 1 tab by mouth daily CLARITIN 10 MG TABS (LORATADINE) 1 tab by mouth daily PROMETHAZINE HCL 12.5 MG TABS (PROMETHAZINE HCL) 1 tablet by mouth q 8 hours as needed nausea MUCINEX MAXIMUM STRENGTH 1200 MG XR12H-TAB (GUAIFENESIN) One tab by mouth BID Current Allergies: CIPRO HALDOL (HALOPERIDOL LACTATE) * TORADOL TRIMOX (AMOXICILLIN) CIPRO HALDOL (HALOPERIDOL LACTATE) * TORADOL TRIMOX (AMOXICILLIN)   PPD Results    Date of reading: 05/15/2008    Results: < 5mm    Interpretation: negative   Orders Added: 1)  No Charge Patient Arrived (NCPA0) [NCPA0]    ]

## 2010-05-05 NOTE — Progress Notes (Signed)
Summary: phn msg  Phone Note Call from Patient Call back at Home Phone [phone]   Caller: Patient Summary of Call: Pt checking on status of Handicap Pl[place] that she left with Dr. Janalyn Harder to fill out. Initial call taken by: Clydell Hakim,  November 06, 2009 3:51 PM  Follow-up for Phone Call        Called pt back. I do not feel that pt requires handicap decal for parking.  She is able to walk fine and was recently seen by physical therapy in hospital, and they determined she did not ned PT.   Pt was not home.  I was not ablet to leave message as no answering machine came on.   Follow-up by: Angeline Slim MD,  November 10, 2009 1:41 PM  Additional Follow-up for Phone Call Additional follow up Details #1::        no answer Additional Follow-up by: Golden Circle RN,  November 10, 2009 2:27 PM    Additional Follow-up for Phone Call Additional follow up Details #2::    no answer. will inform her of md decision when she calls [place] back Follow-up by: Golden Circle RN,  November 10, 2009 3:07 PM   Appended Document: phn msg told her no handicapped tag. she was not happy. states her hip & legs hurt. tried to explain that continuing to move is the best way to stay active and healthy. md did not feel she met criteria for this at this time.  she wants to make an appt in september. told her we will not have it up for about another 2 wks. asked her to call then

## 2010-05-05 NOTE — Assessment & Plan Note (Signed)
Summary: ABI's - Rx Clinic   Vital Signs:  Patient profile:   65 year old female Height:      57 inches Weight:      116.7 pounds BMI:     25.34 Pulse rate:   94 / minute BP sitting:   144 / 85  (left arm)  Primary Care Provider:  [name]  MD   History of Present Illness: Reports pain with walking.  Pain is described as; sorenesss which occurs after: only a few minutes of exercise.  Reports pain worsens when walking up hill or in a hurry.   Reports pain when walking at an ordinary pace on a level surface but states this is much more tolerable than walking uphill.  Reports pain resolves on sitting after only a few seconds.   Pain is localized to: thigh and calf - pimarily in left leg.    Allergies: 1)  Cipro 2)  Haldol (Haloperidol Lactate) 3)  * Toradol 4)  Trimox (Amoxicillin)  Family History: Reviewed history from 08/02/2009 and no changes required. mom with CAD s/p CABG.  died with peritoneal CA 08-27-2001) CAD in GM and GF  Physical Exam  Extremities:  Lower extremity Physical Exam includes: normal temp on palpation,normal capillary refill, diminished pulses, presence of limb hair, current bruise on left shin which has been slow to heal.  No cyanosis,  No pallor, No edema.   Overall ABI l = :  NORMAL0.9 - 1.1 Right Arm:142   mmHg    Left Arm 140:   mmHg Right ankle posterior tibial:136    mmHg     dorsalis pedis: 128    mmHg Left ankle posterior tibial:   144  mmHg    dorsalis pedis: 154  mmHg     Impression & Recommendations:  Problem # 1:  LEG PAIN (ICD-729.5) Assessment Unchanged  Normal ABI and low likely for PAD in a patient with symptoms of: leg pain including worsening with exertion, resolution with rest and localizaton of thigh and calf.   Patient educated about result.  Reviewed all medications and found she has stopped taking her thyroid hormone several months ago.  Recommended she restart this but she noted that she did NOT feel better when she was on it  and she did NOT like the blood monitoring.   No changes in medicaiton today. Written quit plan provided.  F/U Clinic Visit with Dr.: Mayans in the near future to discuss next steps for evaluation and work-up.  Unsure if any other reason for nonadherence.  Total time with patient in face-to-face counseling: 25     minutes.  Patient seen with: Jeralene Peters, PharmD  Orders: Inital Assessment Each [facility] [phone])  Problem # 2:  HYPOTHYROIDISM (ICD-244.9) Assessment: Deteriorated  Non adherent with replacement therapt for several weeks to months in patient with worsening leg pain.  Patient willing to discuss further management with primary MD - Reschedule with Dr. Georgiana Shore.   Currently NOT taking any hormone replacement.   The following medications were removed from the medication list:    Synthroid 88 Mcg Tabs (Levothyroxine sodium) ..... One tab by mouth daily  Orders: Inital Assessment Each - [facility] [phone])  Complete Medication List: 1)  Sm Calcium/vitamin D 500-200 Mg-unit Tabs (Calcium carbonate-vitamin d) .... Take 1 tablet by mouth twice a day 2)  Venlafaxine Hcl 75 Mg Tabs (Venlafaxine hcl) .... Take 1 tab two times a day 3)  Claritin 10 Mg Tabs (Loratadine) .Marland KitchenMarland KitchenMarland Kitchen 1  tab by mouth daily as needed 4)  Alendronate Sodium 70 Mg Tabs (Alendronate sodium) .[name].. 1 tab by mouth once a week 5)  Promethazine Hcl 12.5 Mg Tabs (Promethazine hcl) .... Take one as needed for nausea 6)  Loratadine 10 Mg Tabs (Loratadine) .[name].. 1 by mouth one time daily 7)  Vitamin C 500 Mg Tabs (Ascorbic acid) .... Takes 4 every morning. 8)  Vitamin E 600 Unit Caps (Vitamin e) .... Takes 2 vitamin e 400 international units each morning. 9)  Aspirin 81 Mg Tabs (Aspirin) .... Once daily 10)  Potassium 75 Mg Tabs (Potassium) .... Takes otc potassium one daily. unsure dose.  Patient Instructions: 1)  Blood flow in legs looks normal.  2)  Follow up with Dr. Georgiana Shore in the next few weeks.  As soon as available.    Prescriptions: POTASSIUM 75 MG TABS (POTASSIUM) Takes OTC potassium one daily. unsure dose.  #1 x 0   Entered and Authorized by:   Christian Mate D   Signed by:   Madelon Lips Pharm D on 09/08/2009   Method used:   Historical   RxID:   [id number] ASPIRIN 81 MG  TABS (ASPIRIN) once daily  #1 x 0   Entered and Authorized by:   Christian Mate D   Signed by:   Madelon Lips Pharm D on 09/08/2009   Method used:   Historical   RxID:   [id number] VENLAFAXINE HCL 75 MG TABS (VENLAFAXINE HCL) Take 1 tab two times a day  #60 x 0   Entered and Authorized by:   Christian Mate D   Signed by:   Madelon Lips Pharm D on 09/08/2009   Method used:   Historical   RxID:   [id number] VITAMIN C 500 MG  TABS (ASCORBIC ACID) Takes 4 every morning.  #1 x 0   Entered and Authorized by:   Christian Mate D   Signed by:   Madelon Lips Pharm D on 09/08/2009   Method used:   Historical   RxID:   [id number] PROMETHAZINE HCL 12.5 MG TABS (PROMETHAZINE HCL) Take one as needed for nausea  #30 x 3   Entered by:   Christian Mate D   Authorized by:   [name]  MD   Signed by:   Madelon Lips Pharm D on 09/08/2009   Method used:   Electronically to        [facility] [address] [facility] [address] [phone]* (retail)       [address]       [place], [place]  [address]       Ph: [phone] or [phone]       Fax: [phone]   RxID:   [id number]   Prevention & Chronic Care Immunizations   Influenza vaccine: Fluvax 3+  (01/02/2007)   Influenza vaccine due: 01/02/2008    Tetanus booster: Not documented    Pneumococcal vaccine: Pneumovax (State)  (01/02/2007)   Pneumococcal vaccine due: None    H. zoster vaccine: Not documented  Colorectal Screening   Hemoccult: normal  (01/29/2008)   Hemoccult due: 01/28/2009    Colonoscopy: Done.  (06/03/2005)   Colonoscopy due: 06/04/2015  Other Screening   Pap smear: Not documented    Mammogram: normal  (11/14/2006)    Mammogram due: 11/2007    DXA bone density scan: Not documented   Smoking status: quit  (08/14/2009)  Lipids   Total Cholesterol: 207  (11/29/2006)  LDL: 135  (11/29/2006)   LDL Direct: Not documented   HDL: 56  (11/29/2006)   Triglycerides: 82  (11/29/2006)

## 2010-05-05 NOTE — Progress Notes (Signed)
Summary: Test results  Phone Note Call from Patient Call back at Home Phone [phone]   Reason for Call: Talk to Nurse Summary of Call: Pt is requesting results from pulmonary test. Initial call taken by: Haydee Salter,  November 25, 2006 12:02 PM  Follow-up for Phone Call        Dr. Cindee Lame will contact her when she gets back which is Monday. Follow-up by: Altamese Cabal MD,  November 25, 2006 12:51 PM

## 2010-05-05 NOTE — Miscellaneous (Signed)
Summary: Tobacco [name]  Clinical Lists Changes  Problems: Added new problem of TOBACCO [name] (ICD-305.1) 

## 2010-05-05 NOTE — Progress Notes (Signed)
Summary: triage  Phone Note Call from Patient Call back at Home Phone [phone]   Caller: Patient Summary of Call: had to go to UC and is not better - wants to talk to nurse Initial call taken by: De Nurse,  December 01, 2009 11:26 AM  Follow-up for Phone Call        states she has a cold that she was treated for.  she is on Zpac from UC. c/o cough with clear sputum, states her nose is "a little bit better". denies fever. endorses aching. taking asa for her back ache.  wants to be seen tomorrow. states if she feels better, she will call & cancel in the am advised plenty of fluids & rest Follow-up by: Golden Circle RN,  December 01, 2009 11:28 AM
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
## 2010-05-05 NOTE — Progress Notes (Signed)
Summary: Next appt?  Phone Note Call from Patient Call back at Home Phone [phone]   Reason for Call: Talk to Doctor Summary of Call: pt sts she was here on Friday and wasn't told when to come back, when does pt need f/u? Initial call taken by: ERIN LEVAN,  November 21, 2006 12:20 PM  Follow-up for Phone Call        F/u in 1 to 2 months. Follow-up by: Jackalyn Lombard MD,  November 21, 2006 5:07 PM

## 2010-05-07 NOTE — Assessment & Plan Note (Signed)
Summary: bladder problems/eo   Vital Signs:  Patient profile:   65 year old female Height:      57 inches Weight:      121.3 pounds BMI:     26.34 Pulse rate:   93 / minute BP sitting:   156 / 93  (left arm) Cuff size:   regular  Vitals Entered By: Jimmy Footman, CMA (April 03, 2010 2:27 PM) CC: cold sxs x1 weeks, spitting up blood, bladder issues Is Patient Diabetic? No   Primary Provider:  Cat Ta MD  CC:  cold sxs x1 weeks, spitting up blood, and bladder issues.  History of Present Illness: Pt here to discuss cold symptoms and incontinence.  1. Cold symotoms. Persistent cough x 2 weeks. Pt denies fever, sore throat, N/V.Admits to CP associated with coughing. Reports spitting out 1/2 tsp of blood. She is usnrure if she coughed up the blood or if it was from tooth decay and gingivitis which has occured before. Denies sick contacts. Still smoking 1PPD.   2. Incontinence. started 3 weeks ago.  Pt reports helping her sister move a television, feeling a heaviness in her vagina associated, and  incontience. She describes urge and stress incontinence made worse by frequent coughing.  She denies vaginal bleeding. denies dysuria. She has a gynecologist (Dr. Annamarie Dawley & Su Hilt) who performed her hysterectomy 5 years ago.   3. Hypothyroidism- pt has did not receive Dr. Mack Hook mesage to increase synthroid.    Preventive Screening-Counseling & Management  Alcohol-Tobacco     Smoking Status: current  Allergies: 1)  Cipro (Ciprofloxacin) 2)  Haldol (Haloperidol Lactate) 3)  * Toradol 4)  Trimox  Review of Systems       As per HPI  Physical Exam  General:  Well-developed,well-nourished,in no acute distress; alert,appropriate and cooperative throughout examination. Vitals reviewed.  Neck:  supple and full ROM, no LAD  Lungs:  Poor resp effort.  Decreased breath sounds diffusely.  Mild expiratory wheezing on exam.  No crackels or rales.   Abdomen:  soft, non-tender, and normal bowel  sounds.   Genitalia:  normal introitus and no vaginal discharge.   ? grade cystocele on bimanual exam.    Impression & Recommendations:  Problem # 1:  COPD (ICD-496) Stable vitals and resp status.  Acute exacerbation  Will treat with Doxcy Her updated medication list for this problem includes:    Proventil Hfa 108 (90 Base) Mcg/act Aers (Albuterol sulfate) .[name]... 2 puffs inhaled every 4-6 hours as needed for wheezing or shortness of heatlh.  Problem # 2:  URINARY INCONTINENCE, STRESS, FEMALE (ICD-625.6) Possible 2/2 to cystocele. Pt has a gynecologist who performed her hysterectomy 5 years ago. Advised pt to f/u with her gynecologsit.  Orders: Urinalysis-[facility] (00000)  Complete Medication List: 1)  Venlafaxine Hcl 75 Mg Tabs (Venlafaxine hcl) .... Take 1 tab two times a day 2)  Alendronate Sodium 70 Mg Tabs (Alendronate sodium) .[name].. 1 tab by mouth once a week 3)  Vitamin C 500 Mg Tabs (Ascorbic acid) .... Takes 4 every morning. 4)  Sm Calcium/vitamin D 500-200 Mg-unit Tabs (Calcium carbonate-vitamin d) .... Take 1 tablet by mouth twice a day 5)  Halfprin 162 Mg Tbec (Aspirin) .[name].. 1 tab by mouth daily 6)  Lisinopril 5 Mg Tabs (Lisinopril) .[name].. 1 tab by mouth daily for blood pressure 7)  Levothroid 100 Mcg Tabs (Levothyroxine sodium) .[name].. 1 tab by mouth daily for thyroid. (new dose) please keep same manufacturer 8)  Simvastatin 40 Mg Tabs (  Simvastatin) .[name].. 1 tab by mouth at bedtime for cholesterol 9)  Ambien 10 Mg Tabs (Zolpidem tartrate) .... 1/4 tab by mouth at bedtime per [facility] 10)  Prednisone 20 Mg Tabs (Prednisone) .... 2 tabs by mouth daily x 5 days 11)  Robitussin Coughgels 15 Mg Caps (Dextromethorphan hbr) .[name].. 1 capsule three times a day for cough 12)  Proventil Hfa 108 (90 Base) Mcg/act Aers (Albuterol sulfate) .... 2 puffs inhaled every 4-6 hours as needed for wheezing or shortness of heatlh. 13)  Levothroid 25 Mcg Tabs (Levothyroxine sodium) .... One tab daily  with the 75 micrograms  you have for a daily total of 100 micrograms. 14)  Doxycycline Hyclate 100 Mg Tabs (Doxycycline hyclate) .... One tab daily for 10 days  Patient Instructions: 1)  Ms. Thau  2)  It was nice meeting 3)  1. cough/cold: take doxycline the antiobiotic for 10 days. Take ibuprofen as needed for pain. You can take the natural cough medicine, like Robitusin with honey or something similar. 4)  2. urine accidents- It is likely the bladder prolapse. Go ahead schedule an appointment with your gynecologist for further evaluation. There is no evidence of infection in your urine.  5)  3. Elevated TSH- Dr. Janalyn Harder attempted to contact you. Increased synthorid to 100 micrograms. Since you have the 75 micrograms I sent 25 micrograms that you can add for the next month, then take the 100 micrograms that Dr. Janalyn Harder prescribed.  6)  -Dr. Armen Pickup  Prescriptions: LEVOTHROID 25 MCG TABS (LEVOTHYROXINE SODIUM) one tab daily with the 75 micrograms  you have for a daily total of 100 micrograms.  #30 x 0   Entered and Authorized by:   Dessa Phi MD   Signed by:   Dessa Phi MD on 04/03/2010   Method used:   Print then Give to Patient   RxID:   [id number] DOXYCYCLINE HYCLATE 100 MG TABS (DOXYCYCLINE HYCLATE) one tab daily for 10 days  #10 x 0   Entered and Authorized by:   Dessa Phi MD   Signed by:   Dessa Phi MD on 04/03/2010   Method used:   Print then Give to Patient   RxID:   [id number] LEVOTHROID 25 MCG TABS (LEVOTHYROXINE SODIUM) one tab daily with the 75 micrograms  you have for a daily total of 100 micrograms.  #30 x 0   Entered and Authorized by:   Dessa Phi MD   Signed by:   Dessa Phi MD on 04/03/2010   Method used:   [facility]        [facility] [address] [phone]* (retail)       [address]       [place], [place]  [address]       Ph: [phone] or [phone]       Fax: [phone]   RxID:    [id number]    Orders Added: 1)  Urinalysis-[facility] [00000] 2)  [facility]- [facility] Level 3 [[address]]    Laboratory Results   Urine Tests  Date/Time Received: April 03, 2010 3:29 PM  Date/Time Reported: April 03, 2010 3:37 PM   Routine Urinalysis   Color: lt. yellow Appearance: Clear Glucose: negative   (Normal Range: Negative) Bilirubin: negative   (Normal Range: Negative) Ketone: negative   (Normal Range: Negative) Spec. Gravity: 1.020   (Normal Range: 1.003-1.035) Blood: negative   (Normal Range: Negative) pH: 6.5   (Normal Range: 5.0-8.0) Protein: negative   (  Normal Range: Negative) Urobilinogen: 0.2   (Normal Range: 0-1) Nitrite: negative   (Normal Range: Negative) Leukocyte Esterace: negative   (Normal Range: Negative)    Comments: .........Marland Kitchenbiochemical negative; microscopic not indicated ...............test performed by......Marland KitchenBonnie A. Swazil[place], MLS (ASCP)cm

## 2010-05-07 NOTE — Progress Notes (Signed)
  Phone Note Call from Patient   Caller: Patient Summary of Call: Ms. Stammer called back to say she already had some Prednisone at home that was 20mg  and she took some, but it made her stomach hurt.  Wanted tok now if you could call in something else to the pharmacy for her that want hurt her stomach Initial call taken by: Abundio Miu,  March 25, 2010 4:59 PM  Follow-up for Phone Call        Called pt back, but line was busy. Pt needs to take Prednisone 40mg  daily x 5 days.  For stomach pain she should eat and take omeprazole before taking prednisone.  Follow-up by: Angeline Slim MD,  March 25, 2010 5:06 PM  Additional Follow-up for Phone Call Additional follow up Details #1::        Ms. Shoun says that the Prednisone also make her skin flushed.  And she need something else called in.  She says that it caused this problem before when she has taken it other times, but could not remember to tell provider when she came for appt. Additional Follow-up by: Abundio Miu,  March 26, 2010 12:23 PM    Additional Follow-up for Phone Call Additional follow up Details #2::    Cannot call any else else in. She can come in today for 1 dose of Decadron or Solumedrol, whatever we have in clinic.  Follow-up by: Angeline Slim MD,  March 26, 2010 12:25 PM

## 2010-05-07 NOTE — Letter (Signed)
Summary: TSH too high   [facility]  [address]   [place], [place] [address]   Phone: [phone]  Fax: [phone]    03/26/2010  [name] [id number] [place], [place]  [address]  Dear Ms. Harmsen,  I tried calling you at home but there was no answer and no answering machine for me to leave a message.  Your lab results from yesterday are normal except for TSH, which is your thyroid.  TSH was too high.  I will need to make a change to your medication.  I am changing your Levothorid from 75 micrograms to 100 micrograms daily.  I've sent a new prescription to your pharmacy.  Please pick this up and start taking it right away.  Please make an appointment so that we can recheck TSH in 6 weeks.   Please call us if you have any questions or concerns.     Sincerely,   Bradly Sangiovanni MD  Appended Document: TSH too high  mailed

## 2010-05-07 NOTE — Progress Notes (Signed)
Summary: Triage  Phone Note Call from Patient Call back at Home Phone [phone]   Reason for Call: Talk to Nurse Summary of Call: pt had spit up some blood & wanted to know if that was common with a cold? Initial call taken by: Knox Royalty,  April 10, 2010 11:18 AM  Follow-up for Phone Call        states she has been coughing a lot with her infection. takes last antibiotic tomorrow. assured her heavy coughing may cause small vessels to rupture. states she has not seen any blood lately. she was reassured Follow-up by: Golden Circle RN,  April 10, 2010 11:43 AM

## 2010-05-07 NOTE — Assessment & Plan Note (Signed)
Summary: cold/congestion,df   Vital Signs:  Patient profile:   65 year old female Height:      57 inches Weight:      118.4 pounds BMI:     25.71 O2 Sat:      92 % on Room air Temp:     98.1 degrees F oral Pulse rate:   105 / minute BP sitting:   140 / 82  (right arm)  Vitals Entered By: [name] CMA, (March 25, 2010 3:06 PM)  O2 Flow:  Room air CC: painful cough and wheezing. is not getting better. nasal drainage Is Patient Diabetic? No Pain Assessment Patient in pain? no        Primary Care Provider:  Johney Perotti MD  CC:  painful cough and wheezing. is not getting better. nasal drainage.  History of Present Illness: 65 y/o F here for cough and congestion  COUGH Onset: 5 days ago Modifying factors:  Started with sore throat during the weekend (5 days ago).  Then progressed to cough that hurt her whole body.  She also has ear pain, also itching.    Symptoms Productive: yes, yellow sputum Wheezing: yes Dyspnea: yes, with exertion for example walking up hill  Nasal discharge: yes Fever: no Sore throat: yes, but better. Sick contacts: no Heartburn symptoms: no  History of Asthma: no  Red Flags  Weight loss: no Hemoptysis: no Edema: no  Tobacco: yes, 3/4 pack per day  Habits & Providers  Alcohol-Tobacco-Diet     Tobacco Status: current     Tobacco Counseling: to quit use of tobacco products     Cigarette Packs/Day: 0.75  Current Medications (verified): 1)  Venlafaxine Hcl 75 Mg Tabs (Venlafaxine Hcl) .... Take 1 Tab Two Times A Day 2)  Alendronate Sodium 70 Mg Tabs (Alendronate Sodium) .[name].. 1 Tab By Mouth Once A Week 3)  Vitamin C 500 Mg  Tabs (Ascorbic Acid) .... Takes 4 Every Morning. 4)  Sm Calcium/vitamin D 500-200 Mg-Unit Tabs (Calcium Carbonate-Vitamin D) .... Take 1 Tablet By Mouth Twice A Day 5)  Halfprin 162 Mg Tbec (Aspirin) .[name].. 1 Tab By Mouth Daily 6)  Lisinopril 5 Mg Tabs (Lisinopril) .[name].. 1 Tab By Mouth Daily For Blood Pressure 7)   Levothroid 75 Mcg Tabs (Levothyroxine Sodium) .[name].. 1 Tab By Mouth Daily 8)  Simvastatin 40 Mg Tabs (Simvastatin) .[name].. 1 Tab By Mouth At Bedtime For Cholesterol 9)  Ambien 10 Mg Tabs (Zolpidem Tartrate) .... 1/4 Tab By Mouth At Bedtime Per [facility]  Allergies (verified): 1)  Cipro (Ciprofloxacin) 2)  Haldol (Haloperidol Lactate) 3)  * Toradol 4)  Trimox  Social History: Packs/Day:  0.75  Review of Systems       per hpi   Physical Exam  General:  Well-developed,well-nourished,in no acute distress; alert,appropriate and cooperative throughout examination. Vitals reviewed.  Lungs:  Poor resp effort.  Decreased breath sounds diffusely.  Mild wheezing on exam.  No crackels or rales.   Heart:  Normal rate and regular rhythm. S1 and S2 normal without gallop, murmur, click, rub or other extra sounds. no bruit.  Pulses:  + 2 bilaterally  Extremities:  no edema  Neurologic:  alert & oriented X3.     Impression & Recommendations:  Problem # 1:  COPD (ICD-496) Assessment New 65 y/o F with COPD who continues to smoke.  She is here for cough and wheezing.  Likely not CAP as she is afebrile and lung exam without crackles; so will not  start antbx.  Since she is mildly wheezing will treat as copd exac and start prednisone 40mg  x 5 days.  Will also Rx albuterol hfa for as needed wheezing/dyspnea.  Pt states that tessalon perles do not work for cough so will rx robutussin.  I've advised pt to stop smoking to decrease incidences of exacerbation.  Pt not interested.   Her updated medication list for this problem includes:    Proventil Hfa 108 (90 Base) Mcg/act Aers (Albuterol sulfate) .[name]... 2 puffs inhaled every 4-6 hours as needed for wheezing or shortness of heatlh.  Orders: [facility]- Est Level  3 (63016)  Problem # 2:  TOBACCO USER (ICD-305.1) Assessment: Comment Only No ready to quit.   Complete Medication List: 1)  Venlafaxine Hcl 75 Mg Tabs (Venlafaxine hcl) .... Take 1 tab two times a  day 2)  Alendronate Sodium 70 Mg Tabs (Alendronate sodium) .[name].. 1 tab by mouth once a week 3)  Vitamin C 500 Mg Tabs (Ascorbic acid) .... Takes 4 every morning. 4)  Sm Calcium/vitamin D 500-200 Mg-unit Tabs (Calcium carbonate-vitamin d) .... Take 1 tablet by mouth twice a day 5)  Halfprin 162 Mg Tbec (Aspirin) .[name].. 1 tab by mouth daily 6)  Lisinopril 5 Mg Tabs (Lisinopril) .[name].. 1 tab by mouth daily for blood pressure 7)  Levothroid 75 Mcg Tabs (Levothyroxine sodium) .[name].. 1 tab by mouth daily 8)  Simvastatin 40 Mg Tabs (Simvastatin) .[name].. 1 tab by mouth at bedtime for cholesterol 9)  Ambien 10 Mg Tabs (Zolpidem tartrate) .... 1/4 tab by mouth at bedtime per [facility] 10)  Prednisone 20 Mg Tabs (Prednisone) .... 2 tabs by mouth daily x 5 days 11)  Robitussin Coughgels 15 Mg Caps (Dextromethorphan hbr) .[name].. 1 capsule three times a day for cough 12)  Proventil Hfa 108 (90 Base) Mcg/act Aers (Albuterol sulfate) .... 2 puffs inhaled every 4-6 hours as needed for wheezing or shortness of heatlh.  Other Orders: Basic Met-[facility] [id number]) CBC w/Diff-[facility] (32202) TSH-[facility] [id number])  Patient Instructions: 1)  Please schedule a follow-up appointment in 1-2 week if not better. \par 2)  Tobacco is very bad for your health and your loved ones ! You should stop smoking !  3)  Stop smoking tips: Choose a quit date. Cut down before the quit date. Decide what you will do as a substitute when you feel the urge to smoke(gum, toothpick, exercise).  4)  You may have flare of emphysema.  You can take Prednisone 40mg  daily x 5 days a day.   5)  For cough you can take robutussin three times a day.  Prescriptions: PROVENTIL HFA 108 (90 BASE) MCG/ACT AERS (ALBUTEROL SULFATE) 2 puffs inhaled every 4-6 hours as needed for wheezing or shortness of heatlh.  #1 x 1   Entered and Authorized by:   Angeline Slim MD   Signed by:   Angeline Slim MD on 03/25/2010   Method used:   Electronically to        [facility] [address]  [phone]* (retail)       [address]       [place], [place]  [address]       Ph: [phone] or [phone]       Fax: [phone]   RxID:   [id number] ROBITUSSIN COUGHGELS 15 MG CAPS (DEXTROMETHORPHAN HBR) 1 capsule three times a day for cough  #30 x 1   Entered and Authorized by:   Landrie Beale  Raymundo Rout MD   Signed by:   Angeline Slim MD on 03/25/2010   Method used:   [facility]ically to        [facility] [address] [phone]* (retail)       [address]       [place], [place]  [address]       Ph: [phone] or [phone]       Fax: [phone]   RxID:   [id number] PREDNISONE 20 MG TABS (PREDNISONE) 2 tabs by mouth daily x 5 days  #10 x 0   Entered and Authorized by:   Angeline Slim MD   Signed by:   Angeline Slim MD on 03/25/2010   Method used:   [facility]        [facility] [address] [phone]* (retail)       [address]       [place], [place]  [address]       Ph: [phone] or [phone]       Fax: [phone]   RxID:   [id number]    Orders Added: 1)  Basic Met-[facility] [[id number]] 2)  CBC w/Diff-[facility] [85025] 3)  TSH-[facility] [[id number]] 4)  [facility]- Est Level  3 [73710]

## 2010-05-13 NOTE — Assessment & Plan Note (Signed)
Summary: viral uri   Vital Signs:  Patient profile:   65 year old female Weight:      120.2 pounds Temp:     98.5 degrees F oral Pulse rate:   120 / minute BP sitting:   185 / 134  (right arm)  Vitals Entered By: [name] CMA, (May 05, 2010 2:02 PM) CC: COUGH STARTED THURSDAY Is Patient Diabetic? No Pain Assessment Patient in pain? no        Primary Care Provider:  Cat Ta MD  CC:  COUGH STARTED THURSDAY.  History of Present Illness: cold symptoms since Thursday x 5 days: + rhinnorhea, yellow/clear mucous, + sneezing, + cough, + fatigue, decreased appetite, congestion in chest and head,  No fever.  + nausea/vomiting x 1 yesterday.  No diarrhea.   Pt is a smoker. smokes 1/2 ppd.    Hypertension: on recheck bp was 170/98.   pt denies dizziness.  no h/a.  no syncope.    Habits & Providers  Alcohol-Tobacco-Diet     Tobacco Status: quit  Allergies (verified): 1)  Cipro (Ciprofloxacin) 2)  Haldol (Haloperidol Lactate) 3)  * Toradol 4)  Trimox  Social History: Smoking Status:  quit  Review of Systems       as per hpi  Physical Exam  General:  VSS Well-developed,well-nourished,in no acute distress; alert,appropriate and cooperative throughout examination Eyes:   EOMI. Perrla.  Vision grossly normal. Ears:  External ear exam shows no significant lesions or deformities.  Otoscopic examination reveals clear canals, tympanic membranes are intact bilaterally without bulging, retraction, inflammation or discharge. Hearing is grossly normal bilaterally. Nose:  yellow dried discharge Mouth:  Oral mucosa and oropharynx without lesions or exudates.   Neck:  No deformities, masses, or tenderness noted. Lungs:  Normal respiratory effort, chest expands symmetrically. Lungs are clear to auscultation, no crackles or wheezes. Heart:  Normal rate and regular rhythm. S1 and S2 normal without gallop, murmur, click, rub or other extra sounds. Extremities:  no edema Skin:   Intact without suspicious lesions or rashes Psych:  Cognition and judgment appear intact. Alert and cooperative with normal attention span and concentration. No apparent delusions, illusions, hallucinations.  Some distractibility and difficulty staying on track during conversation   Impression & Recommendations:  Problem # 1:  VIRAL URI (ICD-465.9) most likely symptoms are 2/2 viral syndrome.  Pt given red flags of return.  discussed OTC cold symptom treatment with pt.  Pt states understanding.  Her updated medication list for this problem includes:    Halfprin 162 Mg Tbec (Aspirin) .[name]... 1 tab by mouth daily    Robitussin Coughgels 15 Mg Caps (Dextromethorphan hbr) .[name]... 1 capsule three times a day for cough  Orders: [facility]- Est Level  3 (16109)  Problem # 2:  HYPERTENSION (ICD-401.9) BP elevated at 185/134.  Recheck was 170/98.  pt is to return in 2 weeks for bp recheck with nurse.    Her updated medication list for this problem includes:    Lisinopril 5 Mg Tabs (Lisinopril) .[name]... 1 tab by mouth daily for blood pressure  Complete Medication List: 1)  Venlafaxine Hcl 75 Mg Tabs (Venlafaxine hcl) .... Take 1 tab two times a day 2)  Alendronate Sodium 70 Mg Tabs (Alendronate sodium) .[name].. 1 tab by mouth once a week 3)  Vitamin C 500 Mg Tabs (Ascorbic acid) .... Takes 4 every morning. 4)  Sm Calcium/vitamin D 500-200 Mg-unit Tabs (Calcium carbonate-vitamin d) .... Take 1 tablet by mouth twice a  day 5)  Halfprin 162 Mg Tbec (Aspirin) .[name].. 1 tab by mouth daily 6)  Lisinopril 5 Mg Tabs (Lisinopril) .[name].. 1 tab by mouth daily for blood pressure 7)  Levothroid 100 Mcg Tabs (Levothyroxine sodium) .[name].. 1 tab by mouth daily for thyroid. (new dose) please keep same manufacturer 8)  Simvastatin 40 Mg Tabs (Simvastatin) .[name].. 1 tab by mouth at bedtime for cholesterol 9)  Ambien 10 Mg Tabs (Zolpidem tartrate) .... 1/4 tab by mouth at bedtime per [facility] 10)  Prednisone 20 Mg Tabs (Prednisone)  .... 2 tabs by mouth daily x 5 days 11)  Robitussin Coughgels 15 Mg Caps (Dextromethorphan hbr) .[name].. 1 capsule three times a day for cough 12)  Proventil Hfa 108 (90 Base) Mcg/act Aers (Albuterol sulfate) .... 2 puffs inhaled every 4-6 hours as needed for wheezing or shortness of heatlh.  Patient Instructions: 1)  I think you have a virus: 2)  saline nasal spray 3)  mucinex 4)  other over the counter cough medicines 5)  return if any new or worsening symptoms: 6)  return in 2 weeks for a blood recheck with our nurse- make an appt. follow up your your primary care doctor to get appt.   Orders Added: 1)  [facility]- Est Level  3 [04540]

## 2010-05-19 ENCOUNTER — Ambulatory Visit (INDEPENDENT_AMBULATORY_CARE_PROVIDER_SITE_OTHER): Payer: Medicare Other | Admitting: *Deleted

## 2010-05-19 VITALS — BP 116/78 | HR 96

## 2010-05-19 DIAGNOSIS — I10 Essential (primary) hypertension: Secondary | ICD-10-CM

## 2010-05-19 NOTE — Progress Notes (Deleted)
  Subjective:    Patient ID: [name], female    DOB: 03/05/1946, 64 y.o.   MRN: [id number]  HPI    Review of Systems     Objective:   Physical Exam        Assessment & Plan:   

## 2010-05-19 NOTE — Progress Notes (Signed)
  Subjective:    Patient ID: [name], female    DOB: 09/10/1945, 64 y.o.   MRN: [id number]  HPI    Review of Systems     Objective:   Physical Exam        Assessment & Plan:   

## 2010-05-19 NOTE — Progress Notes (Deleted)
  Subjective:    Patient ID: [name], female    DOB: 08-03-1945, 65 y.o.   MRN: [id number]  HPI    Review of Systems     Objective:   Physical Exam        Assessment & Plan:

## 2010-05-19 NOTE — Progress Notes (Signed)
BP checked manually using regular adult cuff.  Advised patient to schedule appointment to follow up with MD.

## 2010-05-25 ENCOUNTER — Encounter: Payer: Self-pay | Admitting: *Deleted

## 2010-05-25 NOTE — Progress Notes (Unsigned)
[name] from [facility] [phone] wants clearance from [place] for upcoming surgery. I called back & LM asking her to call again. Need to know what they are going to do & if there is a form they want md to complete

## 2010-05-26 NOTE — Progress Notes (Signed)
Pt will need to be seen for exam for surgical clearance.  I called Dr Robert's office and pt's surgery is 06/23/10.  Pt has appt with me on 06/09/10.

## 2010-05-26 NOTE — Progress Notes (Signed)
Pt has to come in for appt for clearance.  I will also need letter from surgeon, Dr Su Hilt.

## 2010-05-29 ENCOUNTER — Telehealth: Payer: Self-pay | Admitting: *Deleted

## 2010-05-29 ENCOUNTER — Ambulatory Visit: Payer: Medicare Other | Admitting: Family Medicine

## 2010-05-29 NOTE — Telephone Encounter (Signed)
Called pt and told her that we had to cancel her appointment for today with Dr Jeanice Lim and schedule with Dr Janalyn Harder on March 8. She needs to see her PCP for surgical clearance.[name], Rodena Medin

## 2010-05-30 ENCOUNTER — Telehealth: Payer: Self-pay | Admitting: Sports Medicine

## 2010-05-30 NOTE — Telephone Encounter (Signed)
Pt calling re 2 complaints.  1. She feels that her BP is up.  Says when it is up she feels flushed.  Has no BP cuff but thinks its up and wondering what to do.  She is wondering what does it mean when "your take your BP medicines and your BP is up?"  No CP, SOB.   2.  Has some pain in her low back.  Not too different than usual but she thinks she has a kidney infection and wonders what to do.  I advised since this is the emergency line I will only be able to advise ED or wait till Monday, advised call [facility] for SDA Monday morning.

## 2010-06-08 ENCOUNTER — Encounter (HOSPITAL_COMMUNITY)
Admission: RE | Admit: 2010-06-08 | Discharge: 2010-06-08 | Disposition: A | Discharge status: Home / Self Care | Payer: Medicare Other | Source: Ambulatory Visit | Attending: Obstetrics and Gynecology | Admitting: Obstetrics and Gynecology

## 2010-06-08 LAB — CBC
HCT: 42 % (ref 36.0–46.0)
Hemoglobin: 13.8 g/dL (ref 12.0–15.0)
MCH: 31.8 pg (ref 26.0–34.0)
MCHC: 32.9 g/dL (ref 30.0–36.0)
MCV: 96.8 fL (ref 78.0–100.0)
Platelets: 265 10*3/uL (ref 150–400)
RBC: 4.34 MIL/uL (ref 3.87–5.11)
RDW: 12.6 % (ref 11.5–15.5)
WBC: 4.9 10*3/uL (ref 4.0–10.5)

## 2010-06-08 LAB — BASIC METABOLIC PANEL
BUN: 11 mg/dL (ref 6–23)
CO2: 27 mEq/L (ref 19–32)
Calcium: 9.1 mg/dL (ref 8.4–10.5)
Chloride: 105 mEq/L (ref 96–112)
Creatinine, Ser: 0.77 mg/dL (ref 0.4–1.2)
GFR calc Af Amer: >60 mL/min (ref >60)
GFR calc non Af Amer: >60 mL/min (ref >60)
Glucose, Bld: 93 mg/dL (ref 70–99)
Potassium: 4.4 mEq/L (ref 3.5–5.1)
Sodium: 140 mEq/L (ref 135–145)

## 2010-06-09 ENCOUNTER — Ambulatory Visit: Payer: Medicare Other | Admitting: Family Medicine

## 2010-06-11 ENCOUNTER — Ambulatory Visit (HOSPITAL_COMMUNITY)
Admission: RE | Admit: 2010-06-11 | Discharge: 2010-06-11 | Disposition: A | Discharge status: Home / Self Care | Payer: Medicare Other | Source: Ambulatory Visit | Attending: Family Medicine | Admitting: Family Medicine

## 2010-06-11 ENCOUNTER — Inpatient Hospital Stay (HOSPITAL_COMMUNITY)
Admission: RE | Admit: 2010-06-11 | Discharge: 2010-06-11 | Disposition: A | Discharge status: Home / Self Care | Payer: Medicare Other | Source: Ambulatory Visit

## 2010-06-11 ENCOUNTER — Encounter: Payer: Self-pay | Admitting: Family Medicine

## 2010-06-11 ENCOUNTER — Ambulatory Visit (INDEPENDENT_AMBULATORY_CARE_PROVIDER_SITE_OTHER): Payer: Medicare Other | Admitting: Family Medicine

## 2010-06-11 VITALS — BP 134/74 | Temp 97.9°F | Ht <= 58 in | Wt 121.0 lb

## 2010-06-11 DIAGNOSIS — I252 Old myocardial infarction: Secondary | ICD-10-CM | POA: Insufficient documentation

## 2010-06-11 DIAGNOSIS — Z01818 Encounter for other preprocedural examination: Secondary | ICD-10-CM

## 2010-06-11 DIAGNOSIS — R9431 Abnormal electrocardiogram [ECG] [EKG]: Secondary | ICD-10-CM | POA: Insufficient documentation

## 2010-06-11 DIAGNOSIS — Z01810 Encounter for preprocedural cardiovascular examination: Secondary | ICD-10-CM | POA: Insufficient documentation

## 2010-06-11 NOTE — Assessment & Plan Note (Addendum)
Pt is at mild to moderate risk for surgery.   Lee's Simple Cardiac Risk Index: 0 (high risk surgery, CAD, CHF, History of CVA, IDDM, Cr > 2  EKG: HR 86 NSR.  Biatrial enlargement.  QTc 437    Chemistry       Component Value Date/Time   NA 140 06/08/2010 1225   K 4.4 06/08/2010 1225   CL 105 06/08/2010 1225   CO2 27 06/08/2010 1225   BUN 11 06/08/2010 1225   CREATININE 0.77 06/08/2010 1225      Component Value Date/Time   CALCIUM 9.1 06/08/2010 1225   ALKPHOS 96 07/02/2008   AST 12 07/02/2008   ALT 8 07/02/2008   BILITOT 0.5 07/02/2008     Lab Results  Component Value Date   WBC 4.9 06/08/2010   HGB 13.8 06/08/2010   HCT 42.0 06/08/2010   MCV 96.8 06/08/2010   PLT 265 06/08/2010

## 2010-06-11 NOTE — Progress Notes (Signed)
  Subjective:    Patient ID: [name], female    DOB: [birth year], 65 y.o.   MRN: [id number]  HPI Ms Rybolt presents today for pre-op exam for TVT (Tension-Free Vaginal Tape) surgery for stress incontinence.  The surgery will be performed by Dr Osborn Coho, of [facility].  Surgery is scheduled for 06/15/10.  She will be receiving general anesthesia for this.  Surgery will take place at [facility] and pt will be staying overnight.     Review of Systems The patient complains of incontinence.  The patient denies anorexia, fever, weight loss, vision loss, decreased hearing, hoarseness, chest pain, syncope, dyspnea on exertion, peripheral edema, headaches, hemoptysis, abdominal pain, melena, hematochezia, hematuria, muscle weakness, suspicious skin lesions, transient blindness, difficulty walking, unusual weight change, abnormal bleeding, enlarged lymph nodes, angioedema, and breast masses.     Objective:   Physical Exam General:  Well-developed,well-nourished,in no acute distress; alert,appropriate and cooperative throughout examination Head:  Normocephalic and atraumatic without obvious abnormalities. No apparent alopecia or balding. Lungs:  Normal respiratory effort, chest expands symmetrically. + Inspiratory wheezing.  Heart:  Normal rate and regular rhythm. S1 and S2 normal without gallop, murmur, click, rub or other extra sounds. Abdomen:  Bowel sounds positive,abdomen soft and non-tender without masses, organomegaly or hernias noted. Pulses:  R and L carotid,radial,femoral,dorsalis pedis and posterior tibial pulses are full and equal bilaterally Extremities:  No clubbing, cyanosis, edema, or deformity noted with normal full range of motion of all joints.   Neurologic:  alert & oriented X3.           Assessment & Plan:

## 2010-06-15 ENCOUNTER — Ambulatory Visit (HOSPITAL_COMMUNITY)
Admission: RE | Admit: 2010-06-15 | Discharge: 2010-06-16 | Disposition: A | Discharge status: Home / Self Care | Payer: Medicare Other | Source: Ambulatory Visit | Attending: Obstetrics and Gynecology | Admitting: Obstetrics and Gynecology

## 2010-06-15 DIAGNOSIS — N8111 Cystocele, midline: Secondary | ICD-10-CM | POA: Insufficient documentation

## 2010-06-15 DIAGNOSIS — Z01812 Encounter for preprocedural laboratory examination: Secondary | ICD-10-CM | POA: Insufficient documentation

## 2010-06-15 DIAGNOSIS — Z01818 Encounter for other preprocedural examination: Secondary | ICD-10-CM | POA: Insufficient documentation

## 2010-06-15 DIAGNOSIS — N393 Stress incontinence (female) (male): Secondary | ICD-10-CM | POA: Insufficient documentation

## 2010-06-15 HISTORY — PX: TRANSVAGINAL TAPE (TVT) REMOVAL: SHX6154

## 2010-06-16 LAB — CBC
HCT: 33.9 % — ABNORMAL LOW (ref 36.0–46.0)
Hemoglobin: 11 g/dL — ABNORMAL LOW (ref 12.0–15.0)
MCH: 31.3 pg (ref 26.0–34.0)
MCHC: 32.4 g/dL (ref 30.0–36.0)
MCV: 96.6 fL (ref 78.0–100.0)
Platelets: 209 10*3/uL (ref 150–400)
RBC: 3.51 MIL/uL — ABNORMAL LOW (ref 3.87–5.11)
RDW: 12.8 % (ref 11.5–15.5)
WBC: 10.6 10*3/uL — ABNORMAL HIGH (ref 4.0–10.5)

## 2010-06-20 LAB — COMPREHENSIVE METABOLIC PANEL
ALT: 11 U/L (ref 0–35)
ALT: 14 U/L (ref 0–35)
AST: 17 U/L (ref 0–37)
AST: 21 U/L (ref 0–37)
Albumin: 3.5 g/dL (ref 3.5–5.2)
Albumin: 4 g/dL (ref 3.5–5.2)
Alkaline Phosphatase: 73 U/L (ref 39–117)
Alkaline Phosphatase: 75 U/L (ref 39–117)
BUN: 13 mg/dL (ref 6–23)
BUN: 13 mg/dL (ref 6–23)
CO2: 28 mEq/L (ref 19–32)
CO2: 26 mEq/L (ref 19–32)
Calcium: 9.2 mg/dL (ref 8.4–10.5)
Calcium: 9 mg/dL (ref 8.4–10.5)
Chloride: 103 mEq/L (ref 96–112)
Chloride: 105 mEq/L (ref 96–112)
Creatinine, Ser: 0.8 mg/dL (ref 0.4–1.2)
Creatinine, Ser: 0.76 mg/dL (ref 0.4–1.2)
GFR calc Af Amer: >60 mL/min (ref >60)
GFR calc Af Amer: >60 mL/min (ref >60)
GFR calc non Af Amer: >60 mL/min (ref >60)
GFR calc non Af Amer: >60 mL/min (ref >60)
Glucose, Bld: 90 mg/dL (ref 70–99)
Glucose, Bld: 90 mg/dL (ref 70–99)
Potassium: 3.2 mEq/L — ABNORMAL LOW (ref 3.5–5.1)
Potassium: 3.9 mEq/L (ref 3.5–5.1)
Sodium: 141 mEq/L (ref 135–145)
Sodium: 138 mEq/L (ref 135–145)
Total Bilirubin: 0.5 mg/dL (ref 0.3–1.2)
Total Bilirubin: 0.7 mg/dL (ref 0.3–1.2)
Total Protein: 5.7 g/dL — ABNORMAL LOW (ref 6.0–8.3)
Total Protein: 6.4 g/dL (ref 6.0–8.3)

## 2010-06-20 LAB — POCT I-STAT, CHEM 8
BUN: 14 mg/dL (ref 6–23)
Calcium, Ion: 1.17 mmol/L (ref 1.12–1.32)
Chloride: 104 mEq/L (ref 96–112)
Creatinine, Ser: 1 mg/dL (ref 0.4–1.2)
Glucose, Bld: 88 mg/dL (ref 70–99)
HCT: 42 % (ref 36.0–46.0)
Hemoglobin: 14.3 g/dL (ref 12.0–15.0)
Potassium: 3.9 mEq/L (ref 3.5–5.1)
Sodium: 139 mEq/L (ref 135–145)
TCO2: 25 mmol/L (ref 0–100)

## 2010-06-20 LAB — CBC
HCT: 36.9 % (ref 36.0–46.0)
HCT: 38.9 % (ref 36.0–46.0)
Hemoglobin: 12.4 g/dL (ref 12.0–15.0)
Hemoglobin: 13.3 g/dL (ref 12.0–15.0)
MCH: 33.9 pg (ref 26.0–34.0)
MCH: 33.9 pg (ref 26.0–34.0)
MCHC: 33.7 g/dL (ref 30.0–36.0)
MCHC: 34.1 g/dL (ref 30.0–36.0)
MCV: 100.8 fL — ABNORMAL HIGH (ref 78.0–100.0)
MCV: 99.3 fL (ref 78.0–100.0)
Platelets: 248 10*3/uL (ref 150–400)
Platelets: 277 10*3/uL (ref 150–400)
RBC: 3.67 MIL/uL — ABNORMAL LOW (ref 3.87–5.11)
RBC: 3.92 MIL/uL (ref 3.87–5.11)
RDW: 13.6 % (ref 11.5–15.5)
RDW: 13.8 % (ref 11.5–15.5)
WBC: 5 10*3/uL (ref 4.0–10.5)
WBC: 6.2 10*3/uL (ref 4.0–10.5)

## 2010-06-20 LAB — URINALYSIS, ROUTINE W REFLEX MICROSCOPIC
Bilirubin Urine: NEGATIVE
Glucose, UA: NEGATIVE mg/dL
Hgb urine dipstick: NEGATIVE
Ketones, ur: NEGATIVE mg/dL
Nitrite: NEGATIVE
Protein, ur: NEGATIVE mg/dL
Specific Gravity, Urine: 1.006 (ref 1.005–1.030)
Urobilinogen, UA: 0.2 mg/dL (ref 0.0–1.0)
pH: 6.5 (ref 5.0–8.0)

## 2010-06-20 LAB — TSH: TSH: 91.266 u[IU]/mL — ABNORMAL HIGH (ref 0.350–4.500)

## 2010-06-20 LAB — HEMOGLOBIN A1C
Hgb A1c MFr Bld: 5.4 % (ref <5.7)
Mean Plasma Glucose: 108 mg/dL (ref <117)

## 2010-06-20 LAB — PROTIME-INR
INR: 0.87 (ref 0.00–1.49)
INR: 0.93 (ref 0.00–1.49)
Prothrombin Time: 11.8 seconds (ref 11.6–15.2)
Prothrombin Time: 12.4 seconds (ref 11.6–15.2)

## 2010-06-20 LAB — LIPID PANEL
Cholesterol: 254 mg/dL — ABNORMAL HIGH (ref 0–200)
HDL: 57 mg/dL (ref >39)
LDL Cholesterol: 166 mg/dL — ABNORMAL HIGH (ref 0–99)
Total CHOL/HDL Ratio: 4.5 RATIO
Triglycerides: 154 mg/dL — ABNORMAL HIGH (ref <150)
VLDL: 31 mg/dL (ref 0–40)

## 2010-06-20 LAB — APTT
aPTT: 35 seconds (ref 24–37)
aPTT: 35 seconds (ref 24–37)

## 2010-06-20 LAB — CARDIAC PANEL(CRET KIN+CKTOT+MB+TROPI)
CK, MB: 2.9 ng/mL (ref 0.3–4.0)
CK, MB: 3.5 ng/mL (ref 0.3–4.0)
Relative Index: 2.5 (ref 0.0–2.5)
Relative Index: 3.1 — ABNORMAL HIGH (ref 0.0–2.5)
Total CK: 114 U/L (ref 7–177)
Total CK: 117 U/L (ref 7–177)
Troponin I: 0.01 ng/mL (ref 0.00–0.06)
Troponin I: <0.01 ng/mL (ref 0.00–0.06)

## 2010-06-20 LAB — RAPID URINE DRUG SCREEN, HOSP PERFORMED
Amphetamines: NOT DETECTED
Barbiturates: NOT DETECTED
Benzodiazepines: NOT DETECTED
Cocaine: NOT DETECTED
Opiates: NOT DETECTED
Tetrahydrocannabinol: NOT DETECTED

## 2010-06-20 LAB — POCT CARDIAC MARKERS
CKMB, poc: 1.9 ng/mL (ref 1.0–8.0)
Myoglobin, poc: 77.2 ng/mL (ref 12–200)
Troponin i, poc: <0.05 ng/mL (ref 0.00–0.09)

## 2010-06-23 LAB — LIPID PANEL
Cholesterol: 236 mg/dL — ABNORMAL HIGH (ref 0–200)
HDL: 56 mg/dL (ref >39)
LDL Cholesterol: 160 mg/dL — ABNORMAL HIGH (ref 0–99)
Total CHOL/HDL Ratio: 4.2 RATIO
Triglycerides: 98 mg/dL (ref <150)
VLDL: 20 mg/dL (ref 0–40)

## 2010-06-23 LAB — BASIC METABOLIC PANEL
BUN: 13 mg/dL (ref 6–23)
CO2: 27 mEq/L (ref 19–32)
Calcium: 8.7 mg/dL (ref 8.4–10.5)
Chloride: 109 mEq/L (ref 96–112)
Creatinine, Ser: 0.77 mg/dL (ref 0.4–1.2)
GFR calc Af Amer: >60 mL/min (ref >60)
GFR calc non Af Amer: >60 mL/min (ref >60)
Glucose, Bld: 78 mg/dL (ref 70–99)
Potassium: 3.2 mEq/L — ABNORMAL LOW (ref 3.5–5.1)
Sodium: 142 mEq/L (ref 135–145)

## 2010-06-23 LAB — POCT I-STAT, CHEM 8
BUN: 15 mg/dL (ref 6–23)
Calcium, Ion: 1.15 mmol/L (ref 1.12–1.32)
Chloride: 106 mEq/L (ref 96–112)
Creatinine, Ser: 0.9 mg/dL (ref 0.4–1.2)
Glucose, Bld: 82 mg/dL (ref 70–99)
HCT: 40 % (ref 36.0–46.0)
Hemoglobin: 13.6 g/dL (ref 12.0–15.0)
Potassium: 3.8 mEq/L (ref 3.5–5.1)
Sodium: 141 mEq/L (ref 135–145)
TCO2: 27 mmol/L (ref 0–100)

## 2010-06-23 LAB — CARDIAC PANEL(CRET KIN+CKTOT+MB+TROPI)
CK, MB: 2.3 ng/mL (ref 0.3–4.0)
CK, MB: 2.6 ng/mL (ref 0.3–4.0)
CK, MB: 2.4 ng/mL (ref 0.3–4.0)
Relative Index: 2.5 (ref 0.0–2.5)
Relative Index: INVALID (ref 0.0–2.5)
Relative Index: INVALID (ref 0.0–2.5)
Total CK: 104 U/L (ref 7–177)
Total CK: 99 U/L (ref 7–177)
Total CK: 95 U/L (ref 7–177)
Troponin I: 0.02 ng/mL (ref 0.00–0.06)
Troponin I: <0.01 ng/mL (ref 0.00–0.06)
Troponin I: 0.03 ng/mL (ref 0.00–0.06)

## 2010-06-23 LAB — COMPREHENSIVE METABOLIC PANEL
ALT: 12 U/L (ref 0–35)
AST: 17 U/L (ref 0–37)
Albumin: 4 g/dL (ref 3.5–5.2)
Alkaline Phosphatase: 64 U/L (ref 39–117)
BUN: 13 mg/dL (ref 6–23)
CO2: 27 mEq/L (ref 19–32)
Calcium: 8.8 mg/dL (ref 8.4–10.5)
Chloride: 106 mEq/L (ref 96–112)
Creatinine, Ser: 0.75 mg/dL (ref 0.4–1.2)
GFR calc Af Amer: >60 mL/min (ref >60)
GFR calc non Af Amer: >60 mL/min (ref >60)
Glucose, Bld: 82 mg/dL (ref 70–99)
Potassium: 3.9 mEq/L (ref 3.5–5.1)
Sodium: 140 mEq/L (ref 135–145)
Total Bilirubin: 0.5 mg/dL (ref 0.3–1.2)
Total Protein: 6.2 g/dL (ref 6.0–8.3)

## 2010-06-23 LAB — DIFFERENTIAL
Basophils Absolute: 0.1 10*3/uL (ref 0.0–0.1)
Basophils Relative: 1 % (ref 0–1)
Eosinophils Absolute: 0.1 10*3/uL (ref 0.0–0.7)
Eosinophils Relative: 2 % (ref 0–5)
Lymphocytes Relative: 28 % (ref 12–46)
Lymphs Abs: 1.5 10*3/uL (ref 0.7–4.0)
Monocytes Absolute: 0.4 10*3/uL (ref 0.1–1.0)
Monocytes Relative: 7 % (ref 3–12)
Neutro Abs: 3.5 10*3/uL (ref 1.7–7.7)
Neutrophils Relative %: 62 % (ref 43–77)

## 2010-06-23 LAB — URINALYSIS, ROUTINE W REFLEX MICROSCOPIC
Bilirubin Urine: NEGATIVE
Glucose, UA: NEGATIVE mg/dL
Hgb urine dipstick: NEGATIVE
Ketones, ur: NEGATIVE mg/dL
Nitrite: NEGATIVE
Protein, ur: NEGATIVE mg/dL
Specific Gravity, Urine: 1.009 (ref 1.005–1.030)
Urobilinogen, UA: 0.2 mg/dL (ref 0.0–1.0)
pH: 6.5 (ref 5.0–8.0)

## 2010-06-23 LAB — URINE CULTURE
Colony Count: NO GROWTH
Culture: NO GROWTH

## 2010-06-23 LAB — TSH: TSH: 70.381 u[IU]/mL — ABNORMAL HIGH (ref 0.350–4.500)

## 2010-06-23 LAB — POCT CARDIAC MARKERS
CKMB, poc: 1.2 ng/mL (ref 1.0–8.0)
CKMB, poc: 1.8 ng/mL (ref 1.0–8.0)
Myoglobin, poc: 58.6 ng/mL (ref 12–200)
Myoglobin, poc: 68 ng/mL (ref 12–200)
Troponin i, poc: <0.05 ng/mL (ref 0.00–0.09)
Troponin i, poc: <0.05 ng/mL (ref 0.00–0.09)

## 2010-06-23 LAB — DRUGS OF ABUSE SCREEN W/O ALC, ROUTINE URINE
Amphetamine Screen, Ur: NEGATIVE
Barbiturate Quant, Ur: NEGATIVE
Benzodiazepines.: NEGATIVE
Cocaine Metabolites: NEGATIVE
Creatinine,U: 57.2 mg/dL
Marijuana Metabolite: NEGATIVE
Methadone: NEGATIVE
Opiate Screen, Urine: NEGATIVE
Phencyclidine (PCP): NEGATIVE
Propoxyphene: NEGATIVE

## 2010-06-23 LAB — LIPASE, BLOOD: Lipase: 31 U/L (ref 11–59)

## 2010-06-23 LAB — CBC
HCT: 33.9 % — ABNORMAL LOW (ref 36.0–46.0)
HCT: 38.6 % (ref 36.0–46.0)
Hemoglobin: 11.8 g/dL — ABNORMAL LOW (ref 12.0–15.0)
Hemoglobin: 12.9 g/dL (ref 12.0–15.0)
MCHC: 34.9 g/dL (ref 30.0–36.0)
MCHC: 33.5 g/dL (ref 30.0–36.0)
MCV: 99.7 fL (ref 78.0–100.0)
MCV: 100 fL (ref 78.0–100.0)
Platelets: 206 10*3/uL (ref 150–400)
Platelets: 252 10*3/uL (ref 150–400)
RBC: 3.4 MIL/uL — ABNORMAL LOW (ref 3.87–5.11)
RBC: 3.86 MIL/uL — ABNORMAL LOW (ref 3.87–5.11)
RDW: 13.9 % (ref 11.5–15.5)
RDW: 13.9 % (ref 11.5–15.5)
WBC: 4.3 10*3/uL (ref 4.0–10.5)
WBC: 5.6 10*3/uL (ref 4.0–10.5)

## 2010-06-23 LAB — T3, FREE: T3, Free: 1.8 pg/mL — ABNORMAL LOW (ref 2.3–4.2)

## 2010-06-23 LAB — T4, FREE: Free T4: 0.55 ng/dL — ABNORMAL LOW (ref 0.80–1.80)

## 2010-07-02 NOTE — Op Note (Signed)
[name], [name]                 ACCOUNT NO.:  [id number]  MEDICAL RECORD NO.:  [id number]           PATIENT TYPE:  O  LOCATION:  9319                          FACILITY:  [facility]  PHYSICIAN:  Osborn Coho, M.D.   DATE OF BIRTH:  09-Oct-1945  DATE OF PROCEDURE:  06/15/2010 DATE OF DISCHARGE:                              OPERATIVE REPORT   PREOPERATIVE DIAGNOSES: 1. Urinary incontinence. 2. Pelvic relaxation.  POSTOPERATIVE DIAGNOSES: 1. Urinary incontinence. 2. Pelvic relaxation.  PROCEDURE: 1. TVT. 2. Anterior posterior pair 3. Cystoscopy.  ATTENDING DOCTOR:  Osborn Coho, M.D.  ASSISTANT:  Marquis Lunch. Adline Peals.  ANESTHESIA:  General via LMA.  FLUIDS:  1500 mL.  URINE OUTPUT:  300 mL.  ESTIMATED BLOOD LOSS:  100 mL.  COMPLICATIONS:  None.  PROCEDURE:  The patient was taken to the operating room after risks, benefits, and alternatives discussed with the patient.  The patient verbalized understanding, consent signed and witnessed.  The patient was placed under general anesthesia and prepped and draped in normal sterile fashion at the dorsal lithotomy position.  A weighted speculum was placed in the patient's vagina and dilute Pitressin injected to the anterior vaginal wall and incision made at the level of the midurethra for approximately 1 cm.  The anterior vaginal wall was dissected away from the underlying tissue down to the level of the lower edge of the symphysis pubis bilaterally and then attention was turned to the mons pubis where two 5-mm incisions were made two fingerbreadths away from the midline at the upper edge of the symphysis pubis.  While a rigid urethral catheter guide was placed in a 18-French Foley and placed in the patient's bladder and deflected to the patient's right, the transabdominal guide was passed from the incision on the mons pubis on the ipsilateral side through the space of Retzius and out through the incision at the anterior  vaginal wall.  The same was done on the contralateral side.  Cystoscopy was performed and no inadvertent bladder injury was noted.  The mesh was then attached to the bilateral transabdominal guide and while deflecting to the ipsilateral side, the mesh was elevated up through the space of Retzius bilaterally. Cystoscopy was performed again and no inadvertent bladder injury was noted.  The Foley was placed in the patient's urethra and while using a large Kelly to leave some slack between the urethra and the mesh, the sheath was removed from the mesh bilaterally.  Indigo carmine was administered and the anterior vaginal wall was repaired with 2-0 Vicryl via interrupted and figure-of-eight stitches.  The Foley was removed and cystoscopy performed and bilateral ureters were noted to be efflux without difficulty.  Again, there was no inadvertent bladder injury noted.  Attention was then turned to the cystocele which in the anterior vaginal wall was injected with Pitressin.  The concentration of Pitressin was 20 units of Pitressin in 100 mL of normal saline and the underlying tissue was dissected away from the anterior vaginal wall and cystocele repaired with a pursestring stitch of 3-0 Vicryl.  The anterior vaginal wall was then repaired with 2-0 Vicryl  via interrupted stitches.  Attention was then turned to the posterior vaginal wall, where dilute Pitressin was injected and an incision was made and the underlying rectocele dissected away from the posterior vaginal wall. The rectocele was repaired with plication stitches of 3-0 Vicryl.  The posterior vaginal wall was then repaired with interrupted figure-of- eight stitches of 2-0 Vicryl and the perineal body was reinforced and a subcuticular stitch of 2-0 Vicryl was used to close the remaining perineum.  An 1 inch plain packing soaked with estrogen cream was placed into the patient's vagina.  The Foley was left to gravity.  Sponge, lap, and  needle count was correct.  The patient tolerated the procedure well and was awaiting transfer to the recovery room in good condition.     Osborn Coho, M.D.     AR/MEDQ  D:  06/15/2010  T:  06/16/2010  Job:  [id number]  Electronically Signed by Osborn Coho M.D. on 07/02/2010 09:50:55 AM

## 2010-07-07 ENCOUNTER — Encounter: Payer: Self-pay | Admitting: Home Health Services

## 2010-07-09 ENCOUNTER — Ambulatory Visit (INDEPENDENT_AMBULATORY_CARE_PROVIDER_SITE_OTHER): Payer: Medicare Other | Admitting: Family Medicine

## 2010-07-09 ENCOUNTER — Encounter: Payer: Self-pay | Admitting: Family Medicine

## 2010-07-09 VITALS — BP 137/75 | HR 112 | Temp 98.0°F | Ht <= 58 in | Wt 119.0 lb

## 2010-07-09 DIAGNOSIS — J069 Acute upper respiratory infection, unspecified: Secondary | ICD-10-CM

## 2010-07-09 DIAGNOSIS — E039 Hypothyroidism, unspecified: Secondary | ICD-10-CM

## 2010-07-09 DIAGNOSIS — N393 Stress incontinence (female) (male): Secondary | ICD-10-CM

## 2010-07-09 DIAGNOSIS — I10 Essential (primary) hypertension: Secondary | ICD-10-CM

## 2010-07-09 LAB — TSH: TSH: 0.067 u[IU]/mL — ABNORMAL LOW (ref 0.350–4.500)

## 2010-07-09 NOTE — Patient Instructions (Signed)
Make appointment for May or June for [place] to discuss ADVANCE DIRECTION.  Make appt for colonoscopy for May or June. Your blood pressure was fine today. Take all your medicines as directed. I will call you about your thyroid test. Try to quit smoking.

## 2010-07-09 NOTE — Progress Notes (Signed)
  Subjective:    Patient ID: [name], female    DOB: 1945/06/02, 65 y.o.   MRN: [id number]  HPI  HPI Urinary Incontinence: Patient complains of urinary incontinence. This has been present for several years. Pt is here for f/u after her TVT surgery for stress incontinence 06/15/10.  She did not take pain meds after she was discharged from hospital.   Hypertension: Patient here for follow-up of elevated blood pressure. She is not exercising and is not adherent to low salt diet.  Blood pressure is not well controlled at this time.  She does not check her blood pressure at home. Cardiac symptoms dyspnea and fatigue. Patient denies chest pain, claudication, irregular heart beat, orthopnea and syncope.  Cardiovascular risk factors: advanced age (older than 86 for men, 79 for women), dyslipidemia, hypertension and smoking/ tobacco exposure. Use of agents associated with hypertension: none. History of target organ damage: none.  Hypothyroidism: Patient presents for evaluation of thyroid function. Symptoms consist of fatigue. Symptoms have present for several days. The symptoms are mild.  The problem has been unchanged.  Previous thyroid studies include TSH. The hypothyroidism is due to hypothyroidism.  Pt has been feeling more tired since her surgery 06/15/10.  She feels tired from walking.   Upper Respiratory Infection: Patient complains of symptoms of a URI. Symptoms include left ear pain, coryza and cough. Onset of symptoms was 1 week ago, gradually improving since that time. She also c/o achiness, congestion, no  fever and productive cough with  yellow colored sputum for the past 1 week .  She is drinking plenty of fluids. Evaluation to date: none. Treatment to date: none. Pt cont to smoke. No interested in quitting.     Review of Systems Per hpi     Objective:   Physical Exam Gen: NAD, AOx3, Vitals reviewed. HEENT: Ears with clear TM b/l, nose with erythema, MMM, no pharyngeal exudates, no  cervical nodes CVS: RRR, no murmurs RESP: clear, no wheezing, no rhonchi, no crackles ABD: nontender, nondistended, +BS EXT: no edema        Assessment & Plan:

## 2010-07-11 DIAGNOSIS — J069 Acute upper respiratory infection, unspecified: Secondary | ICD-10-CM | POA: Insufficient documentation

## 2010-07-11 NOTE — Assessment & Plan Note (Signed)
S/P corrective surgery with TVT by Dr Su Hilt.  Pt still complaining of fatigue and minor pain.  She did not want to take the pain meds given because her pain is tolerable currently.

## 2010-07-11 NOTE — Assessment & Plan Note (Signed)
Recheck BP was 130s/70s, which is at goal.  Will not make changes, so pt will continue on Lisinopril 5mg  daily.

## 2010-07-11 NOTE — Assessment & Plan Note (Signed)
Symptoms of cough x a few days.  No fever.  Lungs clear on exam.  Pt cont to smoke and has history of COPD.  She is not dyspneic.  Will treat with supportive care. Pt to rtc in one wk if symptoms continue and may need atbx +- prednisone (if she becomes dyspneic).

## 2010-07-11 NOTE — Assessment & Plan Note (Signed)
Will check TSH today.  Last TSH was >9 and Levothyroxine dose was changed.  Pt does complain of fatigue, which may/may not be from hypothyroidism vs recovery from surgery.  Will adjust dose of Levothyroxine accordingly.

## 2010-07-12 LAB — COMPREHENSIVE METABOLIC PANEL
ALT: 13 U/L (ref 0–35)
AST: 19 U/L (ref 0–37)
Albumin: 4 g/dL (ref 3.5–5.2)
Alkaline Phosphatase: 74 U/L (ref 39–117)
BUN: 9 mg/dL (ref 6–23)
CO2: 26 mEq/L (ref 19–32)
Calcium: 9.6 mg/dL (ref 8.4–10.5)
Chloride: 103 mEq/L (ref 96–112)
Creatinine, Ser: 0.78 mg/dL (ref 0.4–1.2)
GFR calc Af Amer: >60 mL/min (ref >60)
GFR calc non Af Amer: >60 mL/min (ref >60)
Glucose, Bld: 92 mg/dL (ref 70–99)
Potassium: 3.1 mEq/L — ABNORMAL LOW (ref 3.5–5.1)
Sodium: 140 mEq/L (ref 135–145)
Total Bilirubin: 0.7 mg/dL (ref 0.3–1.2)
Total Protein: 6.1 g/dL (ref 6.0–8.3)

## 2010-07-12 LAB — DIFFERENTIAL
Basophils Absolute: 0 10*3/uL (ref 0.0–0.1)
Basophils Relative: 0 % (ref 0–1)
Eosinophils Absolute: 0.1 10*3/uL (ref 0.0–0.7)
Eosinophils Relative: 1 % (ref 0–5)
Lymphocytes Relative: 19 % (ref 12–46)
Lymphs Abs: 1.4 10*3/uL (ref 0.7–4.0)
Monocytes Absolute: 0.5 10*3/uL (ref 0.1–1.0)
Monocytes Relative: 7 % (ref 3–12)
Neutro Abs: 5.4 10*3/uL (ref 1.7–7.7)
Neutrophils Relative %: 73 % (ref 43–77)

## 2010-07-12 LAB — RAPID URINE DRUG SCREEN, HOSP PERFORMED
Amphetamines: NOT DETECTED
Barbiturates: NOT DETECTED
Benzodiazepines: NOT DETECTED
Cocaine: NOT DETECTED
Opiates: NOT DETECTED
Tetrahydrocannabinol: NOT DETECTED

## 2010-07-12 LAB — URINALYSIS, ROUTINE W REFLEX MICROSCOPIC
Bilirubin Urine: NEGATIVE
Glucose, UA: NEGATIVE mg/dL
Hgb urine dipstick: NEGATIVE
Nitrite: NEGATIVE
Protein, ur: NEGATIVE mg/dL
Specific Gravity, Urine: 1.005 (ref 1.005–1.030)
Urobilinogen, UA: 0.2 mg/dL (ref 0.0–1.0)
pH: 7 (ref 5.0–8.0)

## 2010-07-12 LAB — ETHANOL: Alcohol, Ethyl (B): <5 mg/dL (ref 0–10)

## 2010-07-12 LAB — CBC
HCT: 41.4 % (ref 36.0–46.0)
Hemoglobin: 13.9 g/dL (ref 12.0–15.0)
MCHC: 33.5 g/dL (ref 30.0–36.0)
MCV: 100 fL (ref 78.0–100.0)
Platelets: 290 10*3/uL (ref 150–400)
RBC: 4.14 MIL/uL (ref 3.87–5.11)
RDW: 14 % (ref 11.5–15.5)
WBC: 7.3 10*3/uL (ref 4.0–10.5)

## 2010-07-14 LAB — HIV ANTIBODY (ROUTINE TESTING W REFLEX): HIV: NONREACTIVE

## 2010-07-14 LAB — CBC
HCT: 41.4 % (ref 36.0–46.0)
Hemoglobin: 14.2 g/dL (ref 12.0–15.0)
MCHC: 34.4 g/dL (ref 30.0–36.0)
MCV: 98.1 fL (ref 78.0–100.0)
Platelets: 257 10*3/uL (ref 150–400)
RBC: 4.22 MIL/uL (ref 3.87–5.11)
RDW: 13.1 % (ref 11.5–15.5)
WBC: 6.6 10*3/uL (ref 4.0–10.5)

## 2010-07-14 LAB — BASIC METABOLIC PANEL
BUN: 13 mg/dL (ref 6–23)
CO2: 26 mEq/L (ref 19–32)
Calcium: 9.8 mg/dL (ref 8.4–10.5)
Chloride: 106 mEq/L (ref 96–112)
Creatinine, Ser: 0.67 mg/dL (ref 0.4–1.2)
GFR calc Af Amer: >60 mL/min (ref >60)
GFR calc non Af Amer: >60 mL/min (ref >60)
Glucose, Bld: 102 mg/dL — ABNORMAL HIGH (ref 70–99)
Potassium: 4.4 mEq/L (ref 3.5–5.1)
Sodium: 140 mEq/L (ref 135–145)

## 2010-07-14 LAB — DIFFERENTIAL
Basophils Absolute: 0 10*3/uL (ref 0.0–0.1)
Basophils Relative: 1 % (ref 0–1)
Eosinophils Absolute: 0.1 10*3/uL (ref 0.0–0.7)
Eosinophils Relative: 1 % (ref 0–5)
Lymphocytes Relative: 21 % (ref 12–46)
Lymphs Abs: 1.4 10*3/uL (ref 0.7–4.0)
Monocytes Absolute: 0.5 10*3/uL (ref 0.1–1.0)
Monocytes Relative: 7 % (ref 3–12)
Neutro Abs: 4.6 10*3/uL (ref 1.7–7.7)
Neutrophils Relative %: 70 % (ref 43–77)

## 2010-07-14 LAB — HEPATIC FUNCTION PANEL
ALT: 11 U/L (ref 0–35)
AST: 18 U/L (ref 0–37)
Albumin: 3.8 g/dL (ref 3.5–5.2)
Alkaline Phosphatase: 88 U/L (ref 39–117)
Bilirubin, Direct: 0.1 mg/dL (ref 0.0–0.3)
Indirect Bilirubin: 0.7 mg/dL (ref 0.3–0.9)
Total Bilirubin: 0.8 mg/dL (ref 0.3–1.2)
Total Protein: 6.5 g/dL (ref 6.0–8.3)

## 2010-07-15 ENCOUNTER — Telehealth: Payer: Self-pay | Admitting: Family Medicine

## 2010-07-15 NOTE — Telephone Encounter (Signed)
Patient dropped off handicap placard to be filled out, pt said she continues to have accidents and really needs the placard. Please call pt when ready for p/u. Given to K. Foster for any clinical completion.

## 2010-07-16 ENCOUNTER — Telehealth: Payer: Self-pay | Admitting: Family Medicine

## 2010-07-16 LAB — POCT I-STAT, CHEM 8
BUN: 16 mg/dL (ref 6–23)
Calcium, Ion: 1.16 mmol/L (ref 1.12–1.32)
Chloride: 106 mEq/L (ref 96–112)
Creatinine, Ser: 0.8 mg/dL (ref 0.4–1.2)
Glucose, Bld: 89 mg/dL (ref 70–99)
HCT: 41 % (ref 36.0–46.0)
Hemoglobin: 13.9 g/dL (ref 12.0–15.0)
Potassium: 3.8 mEq/L (ref 3.5–5.1)
Sodium: 140 mEq/L (ref 135–145)
TCO2: 26 mmol/L (ref 0–100)

## 2010-07-16 LAB — POCT H PYLORI SCREEN: H. PYLORI SCREEN, POC: POSITIVE — AB

## 2010-07-16 MED ORDER — LEVOTHYROXINE SODIUM 88 MCG PO TABS: 88.0000 ug | ORAL_TABLET | Freq: Every day | ORAL | Status: DC

## 2010-07-16 NOTE — Telephone Encounter (Signed)
Tried calling pt regarding TSH level (too low).  Will need to change Levothyroxine dose from to 88 mcg.   No answer.  No voicemail/answering machine.  Will send letter that I've sent new Rx to pt pharmacy.

## 2010-07-16 NOTE — Telephone Encounter (Signed)
Name and address completed.  Placed in Dr. Mack Hook box for signature.

## 2010-07-17 NOTE — Telephone Encounter (Signed)
Spoke with pt.  It is not appropriate for her to receive Handicap Placard for urinary incontinence.  I advised her to wear adult diapers.  She states that she does not like to wear them because they are cumbersome.  I advised her to try the pads instead.    **We also discussed TSH being low and that I've changed dose of her levothyroxine.

## 2010-07-31 ENCOUNTER — Telehealth: Payer: Self-pay | Admitting: Family Medicine

## 2010-07-31 NOTE — Telephone Encounter (Signed)
Pt is wanting to know results of labs

## 2010-08-03 NOTE — Telephone Encounter (Signed)
Tried calling pt, but no answer.  She does not have answering machine.  I do not know what lab results she is referring to because the most recent labs we took was for TSH and I discussed the result with pt already.  Told her last time that I wanted to change dose of there levothyroxine.  Pt agreed to plan.

## 2010-08-07 ENCOUNTER — Ambulatory Visit: Payer: Medicare Other | Admitting: Family Medicine

## 2010-08-12 ENCOUNTER — Telehealth: Payer: Self-pay | Admitting: Family Medicine

## 2010-08-12 NOTE — Telephone Encounter (Signed)
[name] would like to have her lab results sent to her from her April appt.

## 2010-08-12 NOTE — Telephone Encounter (Signed)
Labs mailed to patient.[name], [name]

## 2010-08-13 ENCOUNTER — Ambulatory Visit (INDEPENDENT_AMBULATORY_CARE_PROVIDER_SITE_OTHER): Payer: Medicare Other | Admitting: Family Medicine

## 2010-08-13 ENCOUNTER — Encounter: Payer: Self-pay | Admitting: Family Medicine

## 2010-08-13 VITALS — BP 120/87 | HR 111 | Temp 98.4°F | Wt 119.8 lb

## 2010-08-13 DIAGNOSIS — M545 Low back pain, unspecified: Secondary | ICD-10-CM

## 2010-08-13 DIAGNOSIS — N390 Urinary tract infection, site not specified: Secondary | ICD-10-CM

## 2010-08-13 LAB — POCT URINALYSIS DIPSTICK
Bilirubin, UA: NEGATIVE
Blood, UA: NEGATIVE
Glucose, UA: NEGATIVE
Ketones, UA: NEGATIVE
Nitrite, UA: NEGATIVE
Protein, UA: NEGATIVE
Spec Grav, UA: <=1.005
Urobilinogen, UA: 0.2
pH, UA: 6.5

## 2010-08-13 LAB — POCT UA - MICROSCOPIC ONLY

## 2010-08-13 MED ORDER — SULFAMETHOXAZOLE-TRIMETHOPRIM 800-160 MG PO TABS: 1.0000 | ORAL_TABLET | Freq: Two times a day (BID) | ORAL | Status: DC

## 2010-08-13 NOTE — Assessment & Plan Note (Addendum)
Patient complains of acute low back pain s/p corrective surgery w/ TVT on 06/15/10.  Because patient has trace leuks and trace bacteria on micro, I will treat this as an uncomplicated UTI and give Rx for Bactim DS.  For low back pain, advised patient to continue taking Aspirin and to apply ice or heating pads to affected area.  Recommended calling Dr. Su Hilt if low back pain persists.  If patient's symptoms worsen - if she experiences fever, chills, N/V, severe pain, dysuria, hematuria, patient to return to clinic or call PCP.  Patient agreed with plan.

## 2010-08-13 NOTE — Patient Instructions (Signed)
It was great to meet you. Please take medication as directed. Continue to take Aspirin for low back pain.  Consider heat or ice packs to low back to relieve pain. If you continue to experience low back pain and it is associated with fever, chills, burning with urination, foul odor, bloody urine, please return to clinic.   Please let your surgeon know about the back pain at your follow up appointment.  He/she may have recommendations. Thank you!

## 2010-08-13 NOTE — Progress Notes (Signed)
  Subjective:    Patient ID: [name], female    DOB: Nov 22, 1945, 65 y.o.   MRN: [id number]  Back Pain  This is a 65 year old female who presents with CC: low back pain.  S/P corrective surgery with TVT in March 2012.  Has had intermittent pain since then.  Per patient, she was given a prescription for Flagyl after surgery but has just started taking it now.  She is concerned she may have a bladder infection.  Pain is located at lumbar spine and also c/o bilateral flank pain.  Pain is about 3/10.  Relieved by Aspirin.  Pain is worse with urination.   Denies any foul odor, hematuria, discharge, urinary frequency.  Denies any sexual intercourse, low risk for STD.  No fever, chills, NS, N/V, abdominal pain, decreased appetite.   Review of Systems  Musculoskeletal: Positive for back pain.  Per HPI     Objective:   Physical Exam  Constitutional: She appears well-developed and well-nourished.  Cardiovascular: Normal rate and regular rhythm.  Exam reveals no gallop.   No murmur heard. Pulmonary/Chest: Breath sounds normal. No respiratory distress. She has no wheezes. She has no rales.  Abdominal: Soft. Bowel sounds are normal. She exhibits no distension. There is no tenderness.  Musculoskeletal:       Lumbar back: She exhibits tenderness and pain. She exhibits normal range of motion, no bony tenderness, no swelling, no edema, no deformity, no laceration and no spasm.  Skin: Skin is warm and dry. No rash noted. No erythema.          Assessment & Plan:

## 2010-08-14 ENCOUNTER — Telehealth: Payer: Self-pay | Admitting: Family Medicine

## 2010-08-14 ENCOUNTER — Other Ambulatory Visit: Payer: Self-pay | Admitting: Family Medicine

## 2010-08-14 NOTE — Telephone Encounter (Signed)
Pt states that she is having n/v with does of antibiotic.  Reviewed urine studies.  Pt not having urinary frequency, no retention, no dysuria.  No fever.  Continues to have back pain-has chronic back pain.  Pt stats that she can't tolerate the n/v and is requesting a different antibiotic.  Reviewed pt allergy list --allergic to amoxicillin (so unable to give keflex) also allergic to cipro.  Will have pt stop antibiotics--since ua is inconclusive and pt not having any symptoms currently.  Reviewed s/s of uti with pt.  If she has any new or worsening of symptoms pt is to let us know.  Pt to return to care if any new or worsening of symptoms or fever.

## 2010-08-18 NOTE — Discharge Summary (Signed)
[name], [name]                 ACCOUNT NO.:  [id number]   MEDICAL RECORD NO.:  [id number]          PATIENT TYPE:  OIB   LOCATION:  5123                         FACILITY:  [facility]   PHYSICIAN:  Lennie Muckle, MD      DATE OF BIRTH:  01-Oct-1945   DATE OF ADMISSION:  08/15/2008  DATE OF DISCHARGE:  08/16/2008                               DISCHARGE SUMMARY   FINAL DIAGNOSIS:  Left thyroid nodule.   CONDITION ON DISCHARGE:  Good.   HOSPITAL COURSE:  Ms. Moger was taken to the operating room on 13th for  a left thyroid lobectomy.  She had atypical cells on frozen pathology.  Final diagnosis of malignancy could not be established.  There was no  significant abnormalities at the time of her surgery.  The thyroid did  not appear invasive into other tissues.  After her left lobe  thyroidectomy, she was monitored overnight.  She has a mild swelling,  otherwise this is a swelling without difficulty, has a strong voice.  We  did give her Vicodin for pain.  She complained of some mild pressure in  her sinuses and some discharge.  She does request azithromycin at home.   On examination today, her incision has some mild swelling in the skin  inferiorly, no evidence of a hematoma.  The Steri-Strip is clean.  The  voice is strong.  Mild pain in frontal sinuses.   ASSESSMENT AND PLAN:  Status post left lobe thyroidectomy.  Final  pathology pending.  She is discharged home with the instructions not to  lift greater than 20 pounds for 2 weeks.  Vicodin for pain.  Stool  softener for constipation, and azithromycin for 5 days.  Follow up with  me in approximately 2 weeks' time.      Lennie Muckle, MD  Electronically Signed     ALA/MEDQ  D:  08/16/2008  T:  08/16/2008  Job:  [id number]   cc:   Juline Patch, M.D.

## 2010-08-18 NOTE — Op Note (Signed)
[name], [name]                 ACCOUNT NO.:  [id number]   MEDICAL RECORD NO.:  [id number]          PATIENT TYPE:  OIB   LOCATION:  5123                         FACILITY:  [facility]   PHYSICIAN:  Lennie Muckle, MD      DATE OF BIRTH:  [birth year]   DATE OF PROCEDURE:  08/15/2008  DATE OF DISCHARGE:                               OPERATIVE REPORT   PREOPERATIVE DIAGNOSIS:  Left thyroid nodule.   POSTOPERATIVE DIAGNOSIS:  Left thyroid nodule.   PROCEDURE:  Left thyroid lobectomy.   SURGEON:  Bertram Savin, MD   ASSISTANT:  None.   General endotracheal anesthesia.   FINDINGS:  Left thyroid nodule with atypia noted on frozen section.   SPECIMEN:  Left thyroid lobe to Pathology.   Minimal blood loss.   No immediate complications.   INDICATIONS FOR PROCEDURE:  [name] is a 65 year old female who is  found to have a nodule on ultrasound of the thyroid.  FNA revealed  Hurthle cells with concern for atypia.  Due to this lesion being 1.9 cm,  I talked to her about doing a left lobe thyroidectomy in order to fully  review the specimen for malignancy.  Informed consent was obtained for a  left thyroid lobectomy.  All questions were answered.   DETAILS OF PROCEDURE:  [name] was identified in the preoperative  holding area.  Her left neck was marked by me.  She did receive 600 of  clindamycin and was taken to the operating room.  Once in the operating  room, she was placed in supine position.  After administration of  general endotracheal anesthesia, her anterior neck was prepped and  draped in usual sterile fashion.  A time-out procedure was performed.  I  placed an incision 2 finger breaths above the sternal notch, measured  approximately 3 cm laterally from the midline.  Divided the skin with a  #15 blade.  Subcutaneous tissues divided with electrocautery.  I then  divided the platysma muscle, created flaps inferiorly and superiorly up  to the cricoid cartilage and inferiorly down  to the sternal notch.  I  then positioned the horn of retractor to elevate the plastyma muscle.  I  then chose an area in the midline divided the strap muscles in midline.  I was able to dissect the strap muscle off the left lobe of the thyroid  gland without difficulty.  I was able to dissect the inferior thyroid  artery without difficulty.  It was clipped and divided with a harmonic  scalpel.  I proceeded superiorly.  At the superior pole, there was a  small area which seemed like parathyroid gland, I biopsied this and  spared this portion.  I dissected the superior pole of the thyroid  dividing the superior artery with a clip and then dividing with the  harmonic scalpel.  I then continued inferiorly down to the middle  artery.  I  stayed high on the thyroid gland, away from the recurrent  laryngeal nerve.  I proceeded towards the midline and continued,  carefully dissecting the thyroid gland from  the trachea.  I used a  harmonic scalpel to divided the smaller ligaments.  The specimen was  removed and oriented with a stitch superiorly.  The previous biopsy came  back as thyroid gland with atypia.  I then dissected this nodule from  the surrounding tissues.  It was separate from the cricoid cartilage and  was not in the vicinity of the current range of muscle.  I did place a  clip on the superior artery more proximally.  I then sent the specimen  for final pathology for review.  The wound bed was dry.  I placed a  Surgicel within the wound bed after irrigation, reapproximated the strap  muscles with 3-0 Vicryl.  The platysma muscles reapproximated with 3-0  Vicryl.  Skin was closed with 4-0 Monocryl and Steri-Strips placed for  final dressing.  The patient was extubated, transferred to the  postanesthesia care unit in stable condition.  She will be monitored  overnight and I will call her for the final pathology when it is  available.      Lennie Muckle, MD  Electronically  Signed     ALA/MEDQ  D:  08/15/2008  T:  08/16/2008  Job:  [id number]   cc:   Debbe Odea

## 2010-08-19 ENCOUNTER — Other Ambulatory Visit: Payer: Self-pay | Admitting: Family Medicine

## 2010-08-19 NOTE — Telephone Encounter (Signed)
Refill request

## 2010-08-21 ENCOUNTER — Inpatient Hospital Stay (INDEPENDENT_AMBULATORY_CARE_PROVIDER_SITE_OTHER)
Admission: RE | Admit: 2010-08-21 | Discharge: 2010-08-21 | Disposition: A | Discharge status: Home / Self Care | Payer: Medicare Other | Source: Ambulatory Visit | Attending: Emergency Medicine | Admitting: Emergency Medicine

## 2010-08-21 DIAGNOSIS — H8309 Labyrinthitis, unspecified ear: Secondary | ICD-10-CM

## 2010-08-21 LAB — POCT I-STAT, CHEM 8
BUN: 14 mg/dL (ref 6–23)
Calcium, Ion: 1.15 mmol/L (ref 1.12–1.32)
Chloride: 104 mEq/L (ref 96–112)
Creatinine, Ser: 0.9 mg/dL (ref 0.4–1.2)
Glucose, Bld: 90 mg/dL (ref 70–99)
HCT: 40 % (ref 36.0–46.0)
Hemoglobin: 13.6 g/dL (ref 12.0–15.0)
Potassium: 3.8 mEq/L (ref 3.5–5.1)
Sodium: 140 mEq/L (ref 135–145)
TCO2: 26 mmol/L (ref 0–100)

## 2010-08-21 NOTE — Op Note (Signed)
NAMEMARLENA, [name]                 ACCOUNT NO.:  [id number]   MEDICAL RECORD NO.:  [id number]            PATIENT TYPE:   LOCATION:                                FACILITY:  [facility]   PHYSICIAN:  Crist Fat. Rivard, M.D.      DATE OF BIRTH:   DATE OF PROCEDURE:  09/15/2005  DATE OF DISCHARGE:                                 OPERATIVE REPORT   PREOP AND POSTOP DIAGNOSES:  1.  Symptomatic uterine prolapse.  2.  Symptomatic cystocele.  3.  Family history of ovarian cancer   ANESTHESIA:  General.   PROCEDURE:  Total vaginal hysterectomy with bilateral salpingo-oophorectomy  and anterior repair.   SURGEON:  Crist Fat. Rivard, M.D.   ASSISTANTMarquis Lunch. Lowell Guitar, P.A.   DESCRIPTION OF PROCEDURE:  After being informed of the planned procedure  with possible complications including bleeding, infection; injury to  bladder, bowels, or ureters; need for laparoscopy; need for laparotomy;  informed consent is obtained.  The patient is taken to OR #3 given general  anesthesia with laryngeal mask.  She is placed in the lithotomy position,  prepped and draped in a sterile fashion; and a Foley catheter is inserted in  her bladder.  GYN exam reveals a complete uterine prolapse with a complete  cystocele.  A weighted speculum is inserted, anterior lip of the cervix is  grasped with a Jacobs forceps; and the vaginal mucosa is infiltrated in a  circumferential way around the cervix using lidocaine 1% with epinephrine  1:200,000 of 10 mL.  We then performed a circular incision on the vaginal  mucosa; and we bluntly dissect anterior-and-posterior cul-de-sac.  Posterior  cul-de-sac is entered easily.  Bladder is retracted anteriorly; and the  uterosacral ligaments identified and isolated with Rogers forceps, clamped  and section, sutured with a transfix suture of #0 Vicryl kept for future  suspension.  Cardinal ligaments are then isolated on each side with Rogers  forceps, clamped, sectioned, and sutured  with a transfix suture of #0  Vicryl.  Anterior cul-de-sac is then opened easily.  Bladder is fully  retracted anteriorly, and uterine vessels are then clamped on each side with  Rogers forceps.  These are then sectioned and sutured with the #0 Vicryl  suture.  The fundus of the uterus is then delivered posteriorly; and we can  isolate the final pedicle on each side and encompassing utero-ovarian  ligament, tube, and round ligament with Rogers forceps.  These pedicles are  sectioned.  The uterus is removed entirely; and the pedicles are then  sutured with a transfix suture of #0 Vicryl kept for future suspension.  The  bowels are then packed and we are able to identify both ovaries.  The right  ovary is slightly enlarged, measuring 2.5 cm.  The right ovary is atrophic.  Proceeding in the same fashion, we start with the left side and we grabbed  the ovary and the tube with a Babcock forceps; and we isolate the IP  ligament, on the Rogers clamp.  The IP ligament is than sectioned and  sutured with a transfix suture of #0 Vicryl and a free tie of #0 Vicryl and  this is done very close to the ovary.  We proceed in the same fashion on the  left side.   Hemostasis is checked and adequate on all pedicles.   We then proceed with closure of the posterior cul-de-sac with the suture of  #0 Vicryl reuniting uterosacral ligament and posterior cul-de-sac  peritoneum.  We also proceed with closure of the peritoneum with a  suspension stitch using a #0 Vicryl reuniting posterior vaginal mucosa,  posterior cul-de-sac, uterosacral ligament, anterior peritoneum, uterosacral  ligament, posterior cul-de-sac, and posterior vaginal mucosa.  Using the  previously placed sutures on the uterosacral ligaments and a free needle.  We then tie each of the vaginal angles to their corresponding uterosacral  ligament.   Our posterior culdoplasty suture is then tied; and we proceed with the  anterior repair.  The  anterior vaginal mucosa is grasped with Allis forceps.  It is then infiltrated with 8 mL of lidocaine 1%, epinephrine 1:200,000;  and the midline of the anterior vaginal mucosa is dissected sharply with  Strully scissors from the prevesical fascia.  This is done until 1 cm from  the in urethrovesical junction which is angled, which is normal.  We then  sharply and bluntly dissect the prevesical fascia from the anterior vaginal  mucosa until the cystocele can be corrected completely.  We then plicate the  prevesical fascia with U-stitches of 2-0 Vicryl in multiple layers until the  cystocele is completely reduced.  Excess anterior vaginal mucosa is excised  and it has been closed with figure-of-eight stitches of #0 Vicryl.  Then the  vaginal vault is closed with figure-of-eight stitches of 2-0 Vicryl and our  posterior cul-de-sac suspension stitches then closed.  Hemostasis is  adequate.  We removed the weighted speculum; and we packed the vagina with a  1-inch packing with Estrace cream.   Instruments and sponge count is complete x2.  Estimated blood loss is 100  mL.  The procedure is very well tolerated by the patient who is taken to  recovery room in a well and stable condition.      Crist Fat Rivard, M.D.  Electronically Signed     SAR/MEDQ  D:  09/15/2005  T:  09/15/2005  Job:  [id number]

## 2010-08-21 NOTE — Discharge Summary (Signed)
NAME:  [name], [name]                           ACCOUNT NO.:  [id number]   MEDICAL RECORD NO.:  [id number]                   PATIENT TYPE:  IPS   LOCATION:  0502                                 FACILITY:  [facility]   PHYSICIAN:  Geoffery Lyons, M.D.                   DATE OF BIRTH:  [birth year]   DATE OF ADMISSION:  07/26/2003  DATE OF DISCHARGE:  07/29/2003                                 DISCHARGE SUMMARY   CHIEF COMPLAINT AND PRESENT ILLNESS:  This was the first admission to [facility] for this 65 year old divorced female voluntarily  admitted.  She reported to the emergency room complaining she had thoughts  of hanging herself.  She admitted to being depressed and tearful.  She was  going through the death of her mother and the loss of husband to divorce.  Lonely, isolated, decreased appetite, lethargic, hopeless, helpless.   PAST PSYCHIATRIC HISTORY:  Followed by Dr. Raylene Miyamoto at [facility].  She was being switched from Paxil to Prozac.  She has been  on Zoloft, Lexapro, Prozac, and Paxil.   ALCOHOL AND DRUG HISTORY:  Denies the use or abuse of any substances.   PAST MEDICAL HISTORY:  Noncontributory.   MEDICATIONS:  1. Paxil 30 mg per day.  2. BuSpar 10 mg daily.  3. Prozac 10 mg daily.  4. Fosamax every Thursday.   PHYSICAL EXAMINATION:  Performed and failed show any acute findings.   LABORATORY DATA:  CBC within normal limits.  TSH within normal limits.  Blood chemistry within normal limits.   MENTAL STATUS EXAM:  Reveals a fully alert, anxious female, tearful when  talking about the mother's death.  Speech, normal rate, production, and  tempo.  Mood depressed.  Affect depressed.  Helpless, hopeless.  Feeling  overwhelmed.  Positive for passive suicide ideation.  No plan.  Homicidal  ideas, no delusions.  No hallucinations.  Cognition well preserved.   ADMISSION DIAGNOSIS:   AXIS I:  Major depression, recurrent.   AXIS II:   No diagnosis.   AXIS III:  Dysuria, rule out urinary tract infection.   AXIS IV:  Moderate.   AXIS V:  1. Global assessment of functioning upon admission:  39.  2. Highest global assessment of functioning in the last year:  65-70.   HOSPITAL COURSE:  She was admitted and started in intensive individual and  group psychotherapy.  She was given Ambien for sleep.  She was started on  Lexapro 5 and then Paxil was dropped further to 10, 5, then discontinued.  Endorsed multiple stressors.  Once she got into the unit, started opening  up, and talking about it, she started feeling better.  She says that she  realizes there are people that she can count on and she did not have to feel  as lonely as the felt, even with the  losses in her lifetime.  But, overall,  she was feeling better.  Affect was brighter.  There were no suicidal or  homicidal ideation.   On July 29, 2003, she was in full contact with reality.  There were no  suicidal ideas, no homicidal ideas, no hallucinations, no delusions.  She  was adjusting to the Lexapro well.  There were no side effects.  She was  very hopeful.  On July 29, 2003, she was in full contact with reality.  No  suicide ideation, no hallucinations, no delusions.  No homicidal ideas.  Tolerating the Lexapro well.  No side effects of the Paxil.  Willing to  pursue further treatment.  Overall feeling much better.  We went ahead and  discharged to outpatient followup.   DISCHARGE DIAGNOSIS:   AXIS I:  Major depression, recurrent.   AXIS II:  No diagnosis.   AXIS III:  No diagnosis.   AXIS IV:  Moderate.   AXIS V:  Global assessment of functioning on discharge:  55-60.   DISCHARGE MEDICATIONS:  1. Lexapro 10 mg per day.  2. Ambien 10 at bedtime.   FOLLOW UP:  [facility], Dr. Lang Snow.                                               Geoffery Lyons, M.D.    IL/MEDQ  D:  08/21/2003  T:  08/22/2003  Job:  [id number]

## 2010-08-21 NOTE — Op Note (Signed)
[name], [name]                 ACCOUNT NO.:  [id number]   MEDICAL RECORD NO.:  [id number]          PATIENT TYPE:  OIB   LOCATION:  2899                         FACILITY:  [facility]   PHYSICIAN:  Griffith Citron Mohorn, D.D.S.DATE OF BIRTH:  1945-10-12   DATE OF PROCEDURE:  01/21/2004  DATE OF DISCHARGE:                                 OPERATIVE REPORT   PREOPERATIVE DIAGNOSIS:  Odontogenic cyst associated with impacted tooth #32  with involvement of roots of #31.   POSTOPERATIVE DIAGNOSIS:  Odontogenic keratocyst associated with tooth #32  and involvement of roots of tooth #31.   PROCEDURE:  Removal of 2 cm diameter odontogenic cyst associated with deeply  impacted tooth #32, difficult extraction of completely impacted tooth #32,  extraction of tooth #31.   SURGEON:  Griffith Citron Mohorn, D.D.S.   ANESTHESIA:  General anesthesia.   INDICATIONS FOR PROCEDURE:  The patient was referred to our office with a  radiographic finding of a radiolucent lesion surrounding tooth #32.  The  clinical and radiographic panamation revealed a well-circumscribed 2-cm  radiolucency surrounding tooth #32 which was completely impacted.  The roots  of tooth #31 appeared to be involved within the radiolucency of the lesion.  The inferior alveolar nerve could not be identified and suspected to be  inferiorly displaced due to the cyst.  A CT scan was obtained showing that  there was no perforation of the cortical bone on the lateral or medial  surface.  The reports do not indicate location of the inferior alveolar  nerve, and it is suspected that it could not be identified clearly on the  images.   I explained to Mayce that this lesion was likely either a dentigerous cyst  or an odontogenic keratocyst.  The odontogenic keratocyst has a 50-60%  recurrence rate and, therefore, removal of the cystic lesion in an intact  state would decrease the chance of recurrences.  I explained that should it  prove to be an  odontogenic keratocyst, more extensive exposure and surgery  would be required to remove it.  I thoroughly explained the risks associated  with the surgery including a high potential for mandible fracture either  intraoperatively or postoperatively.  I also explained the high likelihood  of inferior alveolar nerve damage which could be permanent or temporary in  nature.  I explained that she really does not have an option in terms of  monitoring this lesion because it has already grown to a significant size  risking pathological fracture.  Most odontogenic cysts will continue to  expand until removed.  Therefore, the lesion has to be removed.  Due to the  size of the lesion and the patient's overall health, I feel that she should  have the procedure performed under general anesthesia at [facility] main  operating room.   DESCRIPTION OF PROCEDURE:  The patient was brought to the operating room and  placed in a supine position.  Once general anesthesia was induced via  orotracheal intubation, the patient was prepped and draped for a  maxillofacial procedure of this type.  Initially, 3.5  cc of 2% lidocaine  with 1:100,000 epinephrine was injected into the right posterior mandible.  A 15 blade was utilized to create an incision at the inferior portion of the  anterior ramus extending anteriorly and around tooth #31 with a lateral  releasing incision.  A full-thickness mucoperiosteal flap was reflected  exposing the lateral cortex as well as the superior portion of the mandible.  A small perforation of the superior cortical bone could be identified.  A  Fisher bur with copious amounts of saline was utilized to create an osseous  window with an attempt being made to create the window exposing the entire  superior portion of the cyst which extended anteriorly to an area slightly  lateral to tooth #31.  Once this bony window was removed and placed in  saline, a brownish/yellowish material could be  identified extruding from the  cystic lesion.  With this characteristic, it is likely an odontogenic  keratocyst and, therefore, every attempt was made to remove the cyst intact.  A curette was utilized to separate the cystic lining from the bony cavity  from the areas that were accessible extending down the lateral and medial  walls of the bony cavity.  With the cystic lining mobilized as much as  possible and the assumption that the roots of tooth #31 were intimately  involved, tooth #31 was loosened from the socket utilizing a #23 extraction  forceps and removed without complications.  With the tooth mobilized and  found to be intimately involved with the cystic lining, the cystic lining  and tooth were at this point removed together.  It should be noted that the  curette was not utilized in the deepest portion of the bony cavity due to  the fact that the location of the inferior alveolar nerve was unknown.  With  the cystic lining and tooth #31 removed, the site was irrigated with normal  saline and suctioned dry.  At this point, the inferior alveolar nerve could  be identified in the base of the bony cavity and had been transected during  the removal of the cystic lining.  It should be noted that a Fisher bur was  not utilized at any point during the procedure with the exception of  creation of the superior bony window to expose the superior portion of the  cystic lesion.  At this point, attention was directed towards removal of  tooth #32.  A Fisher bur was utilized with copious amounts of saline  irrigation to conservatively remove bone surrounding tooth #32 and then  section the tooth.  It was extracted in multiple pieces.  A portion of the  crown which had soft tissue attached was submitted for pathological  evaluation.  With the tooth completely removed, the entire bony cavity was  examined, and any soft tissue fragments were collected and submitted for pathological evaluation.   Next, a sharp curette was then utilized to perform  a mild peripheral ostectomy around the entire bony cavity involved with the  cyst.  This site was irrigated with normal saline thoroughly.   At this point, an attempt was made to reanastomose the transected segments  of the inferior alveolar nerve.  A 5-0 Monocryl suture was utilized and  passed through the proximal segment.  Multiple attempts were made to pass  this suture through the distal segment;  however, because we were unable to  mobilize the distal segment, the attempts were unsuccessful.  It should be  noted that the proximal  segment was mobilized by removal of thin cortical  bone overlying the unexposed portion of the nerve.  The decision was made at  that point that she would be referred to be [facility] Department of Oral  Maxillofacial Surgery for consultation for repair of the transected nerve  within 1-2 weeks.  The site was then irrigated thoroughly with normal saline  and suctioned dry.  The site was then closed with a 3-0 chromic suture in an  interrupted fashion.  The specimens which had been harvested including the  cystic lining, tooth #31, fragments of tooth #32, and the brownish-yellowish  substance within the cystic cavity were placed into formalin for  pathological evaluation.  After the soft tissue was closed with 3-0 chromic  suture, the oral cavity was irrigated with normal saline and suctioned dry.  The oropharyngeal throat pack which had been placed preoperatively was  removed.  She was allowed to awaken in the operating room and extubated  without complications.  She was then transferred to the PACU in stable and  satisfactory condition.       DJM/MEDQ  D:  01/21/2004  T:  01/21/2004  Job:  [id number]

## 2010-08-21 NOTE — Op Note (Signed)
NAME:  [name], [name]                           ACCOUNT NO.:  [id number]   MEDICAL RECORD NO.:  [id number]                   PATIENT TYPE:  AMB   LOCATION:  ENDO                                 FACILITY:  [facility]   PHYSICIAN:  Anselmo Rod, M.D.               DATE OF BIRTH:  06/14/1945   DATE OF PROCEDURE:  DATE OF DISCHARGE:                                 OPERATIVE REPORT   PROCEDURE:  Colonoscopy with biopsies.   ENDOSCOPIST:  Anselmo Rod, M.D.   INSTRUMENT USED:  Olympus video colonoscope.   INDICATIONS FOR PROCEDURE:  Screening colonoscopy being performed in a 65-  year-old white female with a history of left lower quadrant pain and  question of black stools.  Rule out colonic polyps or masses.   PRE-PROCEDURE PREPARATION:  Informed consent was procured from the patient.  The patient fasted for eight hours prior to the procedure and prepped with a  bottle of magnesium citrate and a gallon of GoLYTELY the night prior to the  procedure.   PRE-PROCEDURE PHYSICAL:  VITAL SIGNS:  Stable.  NECK:  Supple.  CHEST:  Clear to auscultation.  CARDIAC:  S1 and S2, regular.  ABDOMEN:  Soft with normal bowel sounds.   DESCRIPTION OF PROCEDURE:  The patient was placed in the left lateral  decubitus position and sedated with 70 mg of Demerol and 7 mg of Versed  intravenously.  Once the patient was adequately sedated and maintained on  low flow oxygen and continuous cardiac monitoring the Olympus video  colonoscope was advanced from the rectum to the cecum.  Multiple washings  were done.  The appendiceal orifice and ileocecal valve were clearly  visualized and photographed.  The terminal ileum appeared normal.  Several  small diverticula were seen scattered in the sigmoid colon.  Small internal  hemorrhoids were seen on retroflexion.  There were a few small sessile  polyps biopsied from the rectum.  The patient tolerated the procedure well  without complications.   IMPRESSION:  1. Small internal hemorrhoids.  2. Few scattered sigmoid diverticula.  3. Multiple small sessile polyps biopsied from the rectum.  4. Normal terminal ileum.  5. Some residual stool in the right colon.   RECOMMENDATIONS:  1. Await pathology results.  2.     Avoid non-steroidals including aspirin for the next two weeks.  3. High fiber diet with liberal fluid intake.  4. Outpatient follow-up in the next two weeks or earlier if needed.                                               Anselmo Rod, M.D.    JNM/MEDQ  D:  01/16/2003  T:  01/16/2003  Job:  [id number]   cc:   Dorinda Hill  Derrek Monaco, M.D.  [address]  [address]  [place]  [place] [address]  Fax: [phone]

## 2010-08-21 NOTE — Discharge Summary (Signed)
NAMETERENCE, GOOGE                 ACCOUNT NO.:  [id number]   MEDICAL RECORD NO.:  [id number]          PATIENT TYPE:  INP   LOCATION:  9310                          [facility]   PHYSICIAN:  Crist Fat. Rivard, M.D. DATE OF BIRTH:  [birth year]   DATE OF ADMISSION:  09/15/2005  DATE OF DISCHARGE:  09/18/2005                                 DISCHARGE SUMMARY   DISCHARGE DIAGNOSES:  1. Symptomatic uterine prolapse.  2. Symptomatic cystocele.  3. Family history of ovarian cancer.   OPERATION:  On the date of admission patient underwent a total vaginal  hysterectomy with bilateral salpingo-oophorectomy with anterior repair,  tolerating procedure well.   HISTORY OF PRESENT ILLNESS:  Ms. Pensabene is a 65 year old single white female  para 2-0-1-2 who presents for a total vaginal hysterectomy with bilateral  salpingo-oophorectomy and anterior repair because of symptomatic uterine  prolapse and symptomatic cystocele.  Please see the patient's history and  physical examination for details.   PREOPERATIVE PHYSICAL EXAMINATION:  VITAL SIGNS:  Blood pressure 140/80,  weight is 100 pounds, height is 5 feet and 1/2 inches tall.  GENERAL:  Within normal limits.  PELVIC:  EGBUS is atrophic with a 4/4 cystocele.  Cervix is nontender  without lesions and is prolapsed beyond the vaginal introitus.  The uterus  is normal size, shape, and consistency and is without tenderness and is  prolapsed.  Adnexa without tenderness or masses.  Rectovaginal without  tenderness or masses.   HOSPITAL COURSE:  On the date of admission patient underwent aforementioned  procedures, tolerating them all well.  Patient's postop hemoglobin was 11  (preop hemoglobin 13.9).  By postop day #3 patient had resumed bowel  function and was therefore deemed ready for discharge home with a Foley  catheter.   DISCHARGE MEDICATIONS:  1. Toradol 10 mg every six hours with food for pain.  2. Multivitamin with iron one tablet  daily.   FOLLOW-UP:  Patient is scheduled for Foley catheter removal on September 21, 2005  at 2 p.m.  A six weeks postoperative visit is with Dr. Estanislado Pandy on October 22, 2005 at 2 p.m.   DISCHARGE INSTRUCTIONS:  Patient was given a copy of [facility]  postoperative instruction sheet.  She was further advised to call for any  increased vaginal bleeding, temperature greater than or equal to 100.4  degrees Fahrenheit orally, or any pain that is unrelieved by her pain  medication.  Patient was also given a copy of the booklet entitled  Recovering From Your Surgery.  She was further advised to avoid driving  for two weeks, heavy lifting for four weeks, intercourse for six weeks, that  she may walk up steps, shower and bathe, and is to increase her activities  slowly.  Patient's diet was without restriction.   FINAL PATHOLOGY:  Uterus excision:  Cervix with benign reactive changes  including hyperkeratosis, no dysplasia identified; benign atrophic appearing  endometrium with a small 0.4 cm sessile polyp of the usual type; no  hyperplasia or evidence of malignancy identified; benign leiomyoma; serosa  with no  pathologic abnormality.  Right ovary and fallopian tube excision:  Complete transection of fallopian tube, benign paratubal cyst, benign  ovarian serous cyst 1.4 cm, endosalpingiosis; and stromal micro fat  calcifications.  Left ovary and fallopian tubes excision:  Complete  transection of fallopian tube, benign ovarian parenchyma with  endosalpingiosis, and rare stromal calcification.      [place]mira J. Adline Peals.      Crist Fat Rivard, M.D.  Electronically Signed    EJP/MEDQ  D:  11/23/2005  T:  11/23/2005  Job:  [id number]

## 2010-08-21 NOTE — H&P (Signed)
[name], [name]                 ACCOUNT NO.:  [id number]   MEDICAL RECORD NO.:  [id number]          PATIENT TYPE:  AMB   LOCATION:  SDC                           [facility]:  [facility]   PHYSICIAN:  Dois Davenport A. Rivard, M.D. DATE OF BIRTH:  [birth year]   DATE OF ADMISSION:  DATE OF DISCHARGE:                                HISTORY & PHYSICAL   HISTORY OF PRESENT ILLNESS:  [name] is a 66 year old single white female,  para 2-0-1-2 who presents for a total vaginal hysterectomy with bilateral  salpingo-oophorectomy and anterior repair because of symptomatic uterine  prolapse and symptomatic cystocele.  For the past two years, the patient has  experienced a tugging sensation in her lower abdomen and has felt a bump  in the vaginal area when she wipes.  She denies any vaginitis symptoms,  urinary incontinence, dysuria, fever, pelvic pain, or changes in her bowel  habits.  She does, however, admit to a sensation of not completely emptying  her bladder and having to strain in order to void.  She states that in the  past she has used a pessary to manage these symptoms; however, found it to  be uncomfortable.  Due to her negative experience with the pessary, the  patient has decided to proceed with definitive therapy in the form of  hysterectomy and anterior repair for her symptoms.   PAST OBSTETRICAL HISTORY:  Gravida 3, para 2-0-1-2.   PAST GYNECOLOGICAL HISTORY:  Menarche 65 years old.  The patient became  menopausal at age 57.  Denies any history of sexually transmitted diseases  or abnormal Pap smears.  Last Pap smear was April, 2007.  Results are  pending.   PAST MEDICAL HISTORY:  1.  Posttraumatic stress disorder.  2.  Depression.  3.  Obsessive compulsive disorder.  4.  Diverticulosis.  5.  Hiatal hernia.  6.  Benign colon polyps.  7.  Gastroesophageal reflux disorder.  8.  Malnutrition.   PAST SURGICAL HISTORY:  1.  In 1965, tonsillectomy.  2.  In 2005, laser eye surgery for  glaucoma.  3.  In 2005, dental surgery for mouth cyst.   The patient denies any history of problems with anesthesia or any history of  blood transfusions.   FAMILY HISTORY:  Positive for ovarian cancer (mother), liver cancer,  hypertension, cardiovascular disease, stroke, and seizure disorder.   HABITS:  The patient does not use alcohol or tobacco.   SOCIAL HISTORY:  The patient is divorced, and she lives alone.   CURRENT MEDICATIONS:  1.  Prozac 20 mg daily.  2.  Estrace vaginal cream 1 g per vagina every other day.  3.  Prenatal vitamins one tablet daily.  4.  Aspirin 325 mg one-half tablet daily (discontinued September 08, 2005).   ALLERGIES:  THE PATIENT IS ALLERGIC TO TRIMOX WHICH CAUSES A RASH, HALDOL  AND THORAZINE BOTH OF WHICH CAUSES HER TO BE CATATONIC, AND CIPRO/WELLBUTRIN  CAUSE NAUSEA AND VOMITING.   PHYSICAL EXAMINATION:  VITAL SIGNS:  Blood pressure 140/80, weight 100  pounds, height 5 feet and 1/2 inches.  NECK:  Supple, without masses.  There is no cervical adenopathy or  thyromegaly.  HEART:  Regular rate and rhythm.  There is no murmur.  LUNGS:  Clear to auscultation.  There are no wheezing, rhonchi, or rales.  BACK:  No CVA tenderness.  ABDOMEN:  Bowel sounds are present.  It is soft without tenderness,  guarding, rebound, or organomegaly.  EXTREMITIES:  Without clubbing, cyanosis, or edema.  PELVIC:  EGBUS is atrophic with a 4/4 cystocele.  The cervix is nontender  without lesions and is prolapsed beyond the vaginal introitus.  The uterus  is normal size, shape, and consistency and is without tenderness and is  prolapsed.  Adnexa without tenderness or masses.  Rectovaginal without  tenderness or masses.   IMPRESSION:  1.  Symptomatic uterine prolapse.  2.  Symptomatic cystocele.   DISPOSITION:  A discussion was held with the patient regarding the  indications for her procedure along with its risks, which include reaction  to anesthesia, damage to adjacent  organs, infection, and excessive bleeding.  The patient has consented to proceed with a total vaginal hysterectomy with  bilateral salpingo-oophorectomy and anterior repair at [facility] on September 15, 2005 at 12:45 p.m. If vaginal Bilateral oophorectomy  is impossible, the patient has consented to laparoscopic removal of ovaries  which she has requested to reduce her risks of future ovarian cancer (ie  familly history).      Elmira J. Adline Peals.      Crist Fat Rivard, M.D.  Electronically Signed    EJP/MEDQ  D:  09/09/2005  T:  09/09/2005  Job:  [id number]

## 2010-08-27 ENCOUNTER — Inpatient Hospital Stay (INDEPENDENT_AMBULATORY_CARE_PROVIDER_SITE_OTHER)
Admission: RE | Admit: 2010-08-27 | Discharge: 2010-08-27 | Disposition: A | Discharge status: Home / Self Care | Payer: Medicare Other | Source: Ambulatory Visit | Attending: Emergency Medicine | Admitting: Emergency Medicine

## 2010-08-27 DIAGNOSIS — M7660 Achilles tendinitis, unspecified leg: Secondary | ICD-10-CM

## 2010-09-17 ENCOUNTER — Telehealth: Payer: Self-pay | Admitting: *Deleted
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
## 2010-09-22 ENCOUNTER — Encounter: Payer: Self-pay | Admitting: Family Medicine

## 2010-09-22 ENCOUNTER — Ambulatory Visit (INDEPENDENT_AMBULATORY_CARE_PROVIDER_SITE_OTHER): Payer: Medicare Other | Admitting: Family Medicine

## 2010-09-22 VITALS — BP 130/80 | HR 100 | Temp 98.5°F | Wt 114.5 lb

## 2010-09-22 DIAGNOSIS — R609 Edema, unspecified: Secondary | ICD-10-CM

## 2010-09-22 DIAGNOSIS — R110 Nausea: Secondary | ICD-10-CM

## 2010-09-22 DIAGNOSIS — E039 Hypothyroidism, unspecified: Secondary | ICD-10-CM

## 2010-09-22 DIAGNOSIS — R600 Localized edema: Secondary | ICD-10-CM

## 2010-09-22 MED ORDER — PROMETHAZINE HCL 12.5 MG PO TABS: 12.5000 mg | ORAL_TABLET | Freq: Four times a day (QID) | ORAL | Status: DC | PRN
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
## 2010-09-22 NOTE — Patient Instructions (Signed)
Please drink more water (at least 8 glasses per day). Tea is a diuretic which will dehydrate you.  So do not drink tea all day long like you have been doing. Remember to eat regular meals, 3 meals + 2 snacks, each day. Come back tomorrow for labs.

## 2010-09-23 ENCOUNTER — Other Ambulatory Visit: Payer: Medicare Other

## 2010-09-23 ENCOUNTER — Encounter: Payer: Self-pay | Admitting: Family Medicine

## 2010-09-23 DIAGNOSIS — R110 Nausea: Secondary | ICD-10-CM

## 2010-09-23 DIAGNOSIS — E039 Hypothyroidism, unspecified: Secondary | ICD-10-CM

## 2010-09-23 DIAGNOSIS — R600 Localized edema: Secondary | ICD-10-CM | POA: Insufficient documentation

## 2010-09-23 LAB — CBC
HCT: 39.9 % (ref 36.0–46.0)
Hemoglobin: 12.9 g/dL (ref 12.0–15.0)
MCH: 31.9 pg (ref 26.0–34.0)
MCHC: 32.3 g/dL (ref 30.0–36.0)
MCV: 98.5 fL (ref 78.0–100.0)
Platelets: 348 10*3/uL (ref 150–400)
RBC: 4.05 MIL/uL (ref 3.87–5.11)
RDW: 13.6 % (ref 11.5–15.5)
WBC: 5.5 10*3/uL (ref 4.0–10.5)

## 2010-09-23 LAB — BASIC METABOLIC PANEL
BUN: 8 mg/dL (ref 6–23)
CO2: 29 mEq/L (ref 19–32)
Calcium: 9.3 mg/dL (ref 8.4–10.5)
Chloride: 101 mEq/L (ref 96–112)
Creat: 0.77 mg/dL (ref 0.50–1.10)
Glucose, Bld: 81 mg/dL (ref 70–99)
Potassium: 4.6 mEq/L (ref 3.5–5.3)
Sodium: 139 mEq/L (ref 135–145)

## 2010-09-23 LAB — TSH: TSH: 0.934 u[IU]/mL (ref 0.350–4.500)

## 2010-09-23 NOTE — Assessment & Plan Note (Signed)
Very odd presenattion of nausea.  It may be from dehydration since pt has been renovating her apt and drinking only unsweetened ice tea.  Symptoms may also be related to hypothyroidism. Pt appears well on exam.  Will check cbc and bmet today.
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
## 2010-09-23 NOTE — Assessment & Plan Note (Addendum)
Pt does not have dx of CHF.  Echo 10/2009: The cavity size was normal. Systolic function was normal. The estimated ejection fraction was in the range of 55% to 60%.  Exam did not show edema.  Advised her to keep her legs elevated when she is sitting or lying down.

## 2010-09-23 NOTE — Progress Notes (Signed)
Labs drawn today [facility] Clorine Swing

## 2010-09-24 ENCOUNTER — Other Ambulatory Visit: Payer: Self-pay | Admitting: Family Medicine

## 2010-09-24 DIAGNOSIS — E039 Hypothyroidism, unspecified: Secondary | ICD-10-CM

## 2010-09-24 MED ORDER — LISINOPRIL 5 MG PO TABS: 5.0000 mg | ORAL_TABLET | Freq: Every day | ORAL | Status: DC

## 2010-09-24 MED ORDER — ALENDRONATE SODIUM 70 MG PO TABS: 70.0000 mg | ORAL_TABLET | ORAL | Status: DC

## 2010-09-24 MED ORDER — LEVOTHYROXINE SODIUM 88 MCG PO TABS: 88.0000 ug | ORAL_TABLET | Freq: Every day | ORAL | Status: DC

## 2010-09-24 MED ORDER — SIMVASTATIN 40 MG PO TABS: 40.0000 mg | ORAL_TABLET | Freq: Every day | ORAL | Status: DC

## 2010-09-24 NOTE — Telephone Encounter (Signed)
Reviewed labs.  All values wnl.  Spoke with patient.  I've refilled her meds (HTN, TSH, HLD) for another 6 months. She will need to come for TSH for recheck in 6 wks.  I will order TSH for future.

## 2010-10-01 ENCOUNTER — Telehealth: Payer: Self-pay | Admitting: Family Medicine

## 2010-10-01 NOTE — Telephone Encounter (Signed)
Needs to speak with someone about a test she had done and if she needs a follow up appt.

## 2010-10-01 NOTE — Telephone Encounter (Signed)
Attempted to call patient, no answer or voicemail. If she returns call will need more information, what test is she referring to?Vilma Meckel, Rodena Medin

## 2010-10-16 ENCOUNTER — Encounter: Payer: Self-pay | Admitting: Family Medicine

## 2010-10-16 ENCOUNTER — Ambulatory Visit (INDEPENDENT_AMBULATORY_CARE_PROVIDER_SITE_OTHER): Payer: Medicare Other | Admitting: Family Medicine

## 2010-10-16 DIAGNOSIS — E041 Nontoxic single thyroid nodule: Secondary | ICD-10-CM

## 2010-10-16 DIAGNOSIS — H9190 Unspecified hearing loss, unspecified ear: Secondary | ICD-10-CM

## 2010-10-18 NOTE — Progress Notes (Signed)
  Subjective:    Patient ID: [name], female    DOB: October 16, 1945, 65 y.o.   MRN: [id number]  HPI Patient is here to discuss lump in her neck.  Has had area checked with ultrasound x2 and needle biopsy which were benign but feels like area has gotten larger.  Does have hx of L hemithyroidectomy.  Area has also become painful and itchy at times.  Last U/S on 04/2009 she was told to have follow up U/S at 6 months but never did.  Recently had labs done to check her thyroid which were normal.  Denies palpitations, chest pain, heat/cold intolerance, diarrhea, jitteriness.  Would like to have another U/S to follow this up  Also wants to discuss referral to audiologist for hearing referral.  Already has appt. On 7/16 at [facility].  Feels like her hearing in her L ear has gotten significantly worse over the past few months.  Denies dizziness, ringing in ears, nausea, vomiting, weakness.   Review of Systems See HPI    Objective:   Physical Exam  Constitutional: She appears well-developed and well-nourished. No distress.  HENT:  Head: Normocephalic and atraumatic.  Right Ear: Tympanic membrane and ear canal normal.  Left Ear: Tympanic membrane and ear canal normal.  Neck: Normal range of motion.       Palpable thyroid nodules on the L side measuring 1cm each.  Non tender.  Cardiovascular: Normal rate, regular rhythm and normal heart sounds.   Pulmonary/Chest: Effort normal and breath sounds normal.  Abdominal: Soft. Bowel sounds are normal.  Musculoskeletal: She exhibits no edema.  Skin: Skin is warm and dry.          Assessment & Plan:

## 2010-10-18 NOTE — Assessment & Plan Note (Signed)
Patient with noticeable decrease in hearing.  Canals clean and TM normal on exam, no red flags to indicated recent stroke.  Will refer to audiology as requested to be evaluated for hearing aids.

## 2010-10-18 NOTE — Assessment & Plan Note (Signed)
Patient with palpable thyroid nodule x2 on left side of neck.  Will get repeat U/S to be sure these are remaining stable. TSH on 09/23/10 was normal.

## 2010-10-21 ENCOUNTER — Telehealth: Payer: Self-pay | Admitting: Family Medicine

## 2010-10-21 NOTE — Telephone Encounter (Signed)
Called back, no answer.

## 2010-10-21 NOTE — Telephone Encounter (Signed)
Has lumps in her arm now and is painful.  She is going on Friday for [place] on her neck.  Would like to talk to nurse about this to see what to do.

## 2010-10-21 NOTE — Telephone Encounter (Signed)
Spoke with patient and she first noticed this lumpy area last night . States area is hard,and sore to touch.  No particular warmth noted , no redness, area is size of 2 fifty cent pieces she states.  Appointment scheduled tomorrow. Offered AM appointment but she wants afternoon appointment.

## 2010-10-22 ENCOUNTER — Ambulatory Visit: Payer: Medicare Other | Admitting: Family Medicine

## 2010-10-23 ENCOUNTER — Ambulatory Visit (HOSPITAL_COMMUNITY): Payer: Medicare Other

## 2010-10-26 ENCOUNTER — Telehealth: Payer: Self-pay | Admitting: Family Medicine

## 2010-10-26 DIAGNOSIS — E041 Nontoxic single thyroid nodule: Secondary | ICD-10-CM

## 2010-10-26 NOTE — Telephone Encounter (Signed)
Pt had to cancel [place] appt due to family conflict.  They need for [place] to set up another appt for her.  She cannot make it herself, they told to tell us to call.

## 2010-10-26 NOTE — Telephone Encounter (Signed)
Ms. Childress thinks she may have  missed Thekla call back.  Wants Thekla to know that she has tried to call back and to please call her back because it is important that she get the appt at [facility].

## 2010-10-26 NOTE — Telephone Encounter (Signed)
Called [facility] to schedule thyroid US for pt. Unable to schedule it, because the pt cancelled appt. Order was cancelled. Need new order.  Called pt. She has upcoming appt with Dr.McGill on 11-05-10 and does not want to wait that long. Will ask Dr.Corey to put new order in and I will schedule it again. Lorenda Hatchet, Renato Battles

## 2010-10-27 NOTE — Telephone Encounter (Signed)
Addended by: Everrett Coombe on: 10/27/2010 09:12 PM   Modules accepted: Orders

## 2010-10-27 NOTE — Telephone Encounter (Signed)
Called and asked for [name] and wanted to check that status of this appt.  Will be going out soon, but will be back later this afternoon.  Please give her a call.

## 2010-10-27 NOTE — Telephone Encounter (Signed)
Order entered

## 2010-10-27 NOTE — Telephone Encounter (Signed)
Pt walked into clinic to find out, when her appt for the thyroid US. I told the pt that I did ask Dr.Matthews to put the order back into her chart and I will call her, once I have the appt for her. Lorenda Hatchet, Renato Battles

## 2010-10-28 ENCOUNTER — Telehealth: Payer: Self-pay | Admitting: *Deleted

## 2010-10-28 ENCOUNTER — Telehealth: Payer: Self-pay | Admitting: Family Medicine

## 2010-10-28 NOTE — Telephone Encounter (Signed)
Called pt. [facility]t at [facility] 10-30-10/1 pm. [place]. Lorenda Hatchet, Renato Battles

## 2010-10-28 NOTE — Telephone Encounter (Signed)
Ms. Sween wanted to have someone look in her chart and tell her what the was the name of the medicine she was prescribed for bipolar.  She said a Dr. Janice Norrie prescribed it and wanted Dr. Fara Boros to write her a rx , but she can't remember the name.  Said if we look in her records we should be able to see what it was.  Want to be called today before closing with the information(smile).

## 2010-10-28 NOTE — Telephone Encounter (Signed)
Called pt and explained, that Dr.McGill will not write Rx for her bipolar medications. She needs to go back to her psychiatrist who prescribed the medications. Pt reports, that the '[facility] has changed nurses and doctors and they are not familiar with her anymore. I encouraged the pt to call them with her medication problem and the pt agreed. [name], Renato Battles

## 2010-10-30 ENCOUNTER — Ambulatory Visit (HOSPITAL_COMMUNITY)
Admission: RE | Admit: 2010-10-30 | Discharge: 2010-10-30 | Disposition: A | Discharge status: Home / Self Care | Payer: Medicare Other | Source: Ambulatory Visit | Attending: Family Medicine | Admitting: Family Medicine

## 2010-10-30 DIAGNOSIS — E041 Nontoxic single thyroid nodule: Secondary | ICD-10-CM

## 2010-10-30 DIAGNOSIS — E049 Nontoxic goiter, unspecified: Secondary | ICD-10-CM | POA: Insufficient documentation

## 2010-11-03 ENCOUNTER — Telehealth: Payer: Self-pay | Admitting: Family Medicine

## 2010-11-03 NOTE — Telephone Encounter (Signed)
Forward to PCP.

## 2010-11-03 NOTE — Telephone Encounter (Signed)
Wants to know results of [place] from last week

## 2010-11-05 ENCOUNTER — Encounter: Payer: Self-pay | Admitting: Family Medicine

## 2010-11-05 ENCOUNTER — Ambulatory Visit (INDEPENDENT_AMBULATORY_CARE_PROVIDER_SITE_OTHER): Payer: Medicare Other | Admitting: Family Medicine

## 2010-11-05 DIAGNOSIS — E041 Nontoxic single thyroid nodule: Secondary | ICD-10-CM

## 2010-11-05 DIAGNOSIS — H9209 Otalgia, unspecified ear: Secondary | ICD-10-CM | POA: Insufficient documentation

## 2010-11-05 NOTE — Progress Notes (Signed)
S: Pt comes in today for multiple complaints, however pt an hour late and informed she could only discuss most pressing issue today.  Pt wanted to know results of thyroid ultrasound- report read and pt stated understanding.  Pt also wanted antibiotics for an ear infection Dr. Memory Argue (ear doctor) told her she had but did not give her medicine for.    ROS: Per HPI  History  Smoking status  . Current Everyday Smoker -- 0.1 packs/day  . Types: Cigarettes  Smokeless tobacco  . Never Used  Comment: only smokes 1-2 cig daily. ready to quitt.    O:  Filed Vitals:   11/05/10 1415  BP: 127/78  Pulse: 94  Temp: 98.3 F (36.8 C)    Gen: NAD HEENT: MMM, pharynx nonerythematous, no exudate; TM's normal bilateally, no cerumen, mild erythema/irritation of left > right ear canal  CV: RRR, no murmur Pulm: CTA bilat, no wheezes or crackles    A/P: 65 y.o. female p/w multiple complaints but late to appoint -See problem list -f/u at my next available appt

## 2010-11-05 NOTE — Patient Instructions (Signed)
I don't think your ears are infected, so I do not think we need to do any antibiotics at this time.  Please come back to discuss the other issues such as the white spot in your mouth.

## 2010-11-05 NOTE — Assessment & Plan Note (Signed)
Mild erythema in canal, TM completely normal, no signs of infection or TM irritation.  No antibiotics needed at this time.  Continue to monitor. Advised pt not to insert anything into ear canals in case this is source of irritation.

## 2010-11-05 NOTE — Assessment & Plan Note (Signed)
D/w pt the results of thyroid US: multiple nodules, not c/w cancer. Continue to monitor.

## 2010-11-13 ENCOUNTER — Ambulatory Visit (INDEPENDENT_AMBULATORY_CARE_PROVIDER_SITE_OTHER): Payer: Medicare Other | Admitting: Family Medicine

## 2010-11-13 ENCOUNTER — Ambulatory Visit (HOSPITAL_COMMUNITY)
Admission: RE | Admit: 2010-11-13 | Discharge: 2010-11-13 | Disposition: A | Discharge status: Home / Self Care | Payer: Medicare Other | Source: Ambulatory Visit | Attending: Family Medicine | Admitting: Family Medicine

## 2010-11-13 ENCOUNTER — Encounter: Payer: Self-pay | Admitting: Family Medicine

## 2010-11-13 VITALS — BP 129/82 | HR 83 | Temp 97.8°F | Ht <= 58 in | Wt 112.0 lb

## 2010-11-13 DIAGNOSIS — M79609 Pain in unspecified limb: Secondary | ICD-10-CM | POA: Insufficient documentation

## 2010-11-13 DIAGNOSIS — M899 Disorder of bone, unspecified: Secondary | ICD-10-CM | POA: Insufficient documentation

## 2010-11-13 DIAGNOSIS — R238 Other skin changes: Secondary | ICD-10-CM

## 2010-11-13 DIAGNOSIS — M79601 Pain in right arm: Secondary | ICD-10-CM

## 2010-11-13 DIAGNOSIS — I8390 Asymptomatic varicose veins of unspecified lower extremity: Secondary | ICD-10-CM

## 2010-11-13 NOTE — Progress Notes (Signed)
  Subjective:    Patient ID: Boykin Nearing, female    DOB: 02-28-1946, 65 y.o.   MRN: [id number]  HPI Patient here with multiple complaints, including Right arm pain and bruising, leg veins, wanting a workup for thyroid nodules, wanting workup for ischemic arteries found on MRI in 2011, etc.  As she is a work-in, we had to whittle them down to just 2 things:  1.  Right arm pain:  Chronic, has been going on for 2 months.  Also with persistent bruising of the area that has not resolved.  No trauma that she knows of.  Describes as aching pain, sometimes with sharp pain localized to upper arm below shoulder.  Wants to make sure nothing has happened to her arm.  No fevers, chills, recent falls.  2.  Varicose veins:  Located Left lower leg veins.  Concerned for cosmetic reasons and also wants to ensure they are not dangerous.     Review of Systems See HPI above for review of systems.       Objective:   Physical Exam MSK:  Multiple lipomas scattered Right upper extremity, easily palpated and movable, not firm.  Pain upon palpation of lateral upper arm in deltoid area, also extending to biceps.  No decrease in shoulder ROM.  Shoulder testing negative for pain.  Strength 4/5 BL UE's.   Skin:  Mole located Right lateral arm, 2 mm in size.  No bleeding or irritation noted.  Bruising noted BL upper extremities, deltoid and bicep areas.  Multiple varicose veins located BL lower extremities, L > R      Assessment & Plan:

## 2010-11-13 NOTE — Patient Instructions (Signed)
We will check an x-ray of your arm as well as a blood test for the bruising.   I will send in a referral for the vein specialist and see if they can help.   Your next appointment with be on August 16 with Dr. Alvester Morin.  Try and see when you can get in to see your regular PCP Dr. Fara Boros as well.   It was good to meet you today.

## 2010-11-14 LAB — CBC
HCT: 40.8 % (ref 36.0–46.0)
Hemoglobin: 12.7 g/dL (ref 12.0–15.0)
MCH: 31.4 pg (ref 26.0–34.0)
MCHC: 31.1 g/dL (ref 30.0–36.0)
MCV: 101 fL — ABNORMAL HIGH (ref 78.0–100.0)
Platelets: 301 10*3/uL (ref 150–400)
RBC: 4.04 MIL/uL (ref 3.87–5.11)
RDW: 13.3 % (ref 11.5–15.5)
WBC: 5.3 10*3/uL (ref 4.0–10.5)

## 2010-11-15 DIAGNOSIS — M79601 Pain in right arm: Secondary | ICD-10-CM | POA: Insufficient documentation

## 2010-11-15 DIAGNOSIS — I8390 Asymptomatic varicose veins of unspecified lower extremity: Secondary | ICD-10-CM | POA: Insufficient documentation

## 2010-11-15 DIAGNOSIS — R238 Other skin changes: Secondary | ICD-10-CM | POA: Insufficient documentation

## 2010-11-15 DIAGNOSIS — R233 Spontaneous ecchymoses: Secondary | ICD-10-CM | POA: Insufficient documentation

## 2010-11-15 NOTE — Assessment & Plan Note (Signed)
Also unclear etiology for bruising.  No clear trauma to area.   Obtain CBC to evaluate for platelets.  She is to FU with PCP later this month, although she has an appt scheduled already with Dr. Alvester Morin.  Discussed with her that she needs to follow up with PCP more regularly so they can establish a relationship.

## 2010-11-15 NOTE — Assessment & Plan Note (Addendum)
Left > Right. Reassured patient these are not dangerous.  Desires referal to vein specialist to see if they can be removed for cosmetic reasons

## 2010-11-15 NOTE — Assessment & Plan Note (Signed)
Unclear etiology of pain.  She does not want pain relievers today, just wants to ensure nothing is wrong with her arm. Refer for x-ray, though likely low yield.

## 2010-11-17 ENCOUNTER — Encounter: Payer: Self-pay | Admitting: Family Medicine

## 2010-11-19 ENCOUNTER — Ambulatory Visit (INDEPENDENT_AMBULATORY_CARE_PROVIDER_SITE_OTHER): Payer: Medicare Other | Admitting: Family Medicine

## 2010-11-19 ENCOUNTER — Encounter: Payer: Self-pay | Admitting: Family Medicine

## 2010-11-19 DIAGNOSIS — M79609 Pain in unspecified limb: Secondary | ICD-10-CM

## 2010-11-19 DIAGNOSIS — M79601 Pain in right arm: Secondary | ICD-10-CM

## 2010-11-19 DIAGNOSIS — H9209 Otalgia, unspecified ear: Secondary | ICD-10-CM

## 2010-11-19 NOTE — Patient Instructions (Addendum)
It was good to meet you today  I think the pain in your arm is likely from the strain in your muscles.  You can use tylenol and ibuprofen as needed for this  There may be some tinnitus that is contributing to your ear irritation Try to avoid smoking  Be sure to follow up with the audiologist as previously recommended  Try to follow up with your PCP over the next 2-4 weeks to establish care Call with any questions,  God Bless, Doree Albee MD   Tinnitus (Noise in the Ears) Sounds you hear in your ears and coming from within the ear is called tinnitus. This can be a symptom of many ear disorders. It is often associated with hearing loss.  Tinnitus can be seen with:  Infections.  Ear blockages such as wax buildup.   Meniere's disease.  Ear damage.   Inherited.  Occupational causes.   While irritating, it is not usually a threat to health. When the cause of the tinnitus is wax, infection in the middle ear, or foreign body it is easily treated. Hearing loss will usually be reversible.  TREATMENT When treating the underlying cause does not get rid of tinnitus, it may be necessary to get rid of the unwanted sound by covering it up with more pleasant background noises. This may include music, the radio etc. There are tinnitus maskers which can be worn which produce background noise to cover up the tinnitus. Avoid all medications which tend to make tinnitus worse such as alcohol, caffeine, aspirin, and nicotine. There are many soothing background tapes such as rain, ocean, thunderstorms, etc. These soothing sounds help with sleeping or resting. Keep all follow-up appointments and referrals. This is important to identify the cause of the problem. It also helps avoid complications, impaired hearing, disability, or chronic pain. Document Released: 03/22/2005 Document Re-Released: 10/03/2006 [facility] Patient Information 2011 [place], [place].

## 2010-11-22 NOTE — Assessment & Plan Note (Signed)
Likely MSK strain in setting of vigorous physical activity. toradol shot given in clinic. Discuss RICE.

## 2010-11-22 NOTE — Assessment & Plan Note (Addendum)
Likely tinnitus in further discussion with pt. Tinnitus handout given to pt. Advised pt that smoking cessation may help with this. Previous referral to audiologist has been made; encouraged follow up. Pt agreeable.

## 2010-11-22 NOTE — Progress Notes (Signed)
  Subjective:    Patient ID: [name], female    DOB: 1945/07/20, 65 y.o.   MRN: [id number]  HPI Pt is here for follow up of imaging last week in setting of R shoulder pain.  R Shoulder X-Ray: negative for fracture, + osteopenia Today pt states that R shoulder pain has persisted. Pt states that she has been moving furniture in the house. Pt states that she has been lifting heavy material. Pt is R hand dominant.   Ear Ringing: Pt also reports L ear ringing and high pitched noises. No frank ear pain per pt. Pt states that this has been a longterm issue. Pt states that she has been told that she may have tinnitus in the past, but pt is unsure if this is true. Had appt with an audiologist, but pt states that she did not go.  Pt denies any dizziness, decrease in hearing, vertiginous type symptoms.     Review of Systems See HPI     Objective:   Physical Exam Gen: up in chair, NAD HEENT: NCAT, EOMI, TMs clear bilaterally CV: RRR, no murmurs auscultated PULM: CTAB, no wheezes, rales, rhoncii ABD: S/NT/+ bowel sounds  EXT: 2+ peripheral pulses   Shoulder exam - right; full range of motion, + ttp on anterior/posterior shoulder; + pain along R lateral rib cage, full range of motion, no tenderness or deformity noted.  Neuro: CN II-XII grossly intact. No nystagmus; dix-hallpike negative.  Assessment & Plan:

## 2010-11-25 ENCOUNTER — Ambulatory Visit (INDEPENDENT_AMBULATORY_CARE_PROVIDER_SITE_OTHER): Payer: Medicare Other | Admitting: Family Medicine

## 2010-11-25 ENCOUNTER — Encounter: Payer: Self-pay | Admitting: Family Medicine

## 2010-11-25 VITALS — BP 145/90 | HR 80 | Wt 112.0 lb

## 2010-11-25 DIAGNOSIS — K529 Noninfective gastroenteritis and colitis, unspecified: Secondary | ICD-10-CM

## 2010-11-25 DIAGNOSIS — F17200 Nicotine dependence, unspecified, uncomplicated: Secondary | ICD-10-CM

## 2010-11-25 DIAGNOSIS — M79609 Pain in unspecified limb: Secondary | ICD-10-CM

## 2010-11-25 DIAGNOSIS — I10 Essential (primary) hypertension: Secondary | ICD-10-CM

## 2010-11-25 DIAGNOSIS — F319 Bipolar disorder, unspecified: Secondary | ICD-10-CM

## 2010-11-25 DIAGNOSIS — H9190 Unspecified hearing loss, unspecified ear: Secondary | ICD-10-CM

## 2010-11-25 DIAGNOSIS — K5289 Other specified noninfective gastroenteritis and colitis: Secondary | ICD-10-CM

## 2010-11-25 DIAGNOSIS — I8390 Asymptomatic varicose veins of unspecified lower extremity: Secondary | ICD-10-CM

## 2010-11-25 DIAGNOSIS — M79673 Pain in unspecified foot: Secondary | ICD-10-CM | POA: Insufficient documentation

## 2010-11-25 DIAGNOSIS — E039 Hypothyroidism, unspecified: Secondary | ICD-10-CM

## 2010-11-25 DIAGNOSIS — E041 Nontoxic single thyroid nodule: Secondary | ICD-10-CM

## 2010-11-25 MED ORDER — LISINOPRIL 5 MG PO TABS: 5.0000 mg | ORAL_TABLET | Freq: Every day | ORAL | Status: DC

## 2010-11-25 MED ORDER — LEVOTHYROXINE SODIUM 88 MCG PO TABS: 88.0000 ug | ORAL_TABLET | Freq: Every day | ORAL | Status: DC

## 2010-11-25 NOTE — Assessment & Plan Note (Addendum)
Per pt, was told that she has Morton's neuroma by Dr. Farris Has in the past but does not want to go back to be seen by him since she does not like him.  Was supposed to get MRI for possible surgical intervention but did not want to so she did not.  Point tenderness b/t 1st and 2nd and 2nd and 3rd toes as well as over big toe.  Will refer to sports med for assistance.

## 2010-11-25 NOTE — Patient Instructions (Addendum)
Based on your ultrasound, there is NO need to do any kind of biopsy of your thyroid.  There are a few very small nodules, but NOT needing biopsy. The bumps on the inside of your mouth are likely just little mucus bumps, they are not anything dangerous and nothing needs to be done with them. For your varicose veins, you can look into getting cosmetic treatments by a dermatologist.   For your feet, try to wear supportive shoes like tennis shoes.  For your corns/calluses, you can get over the counter corn removers to help with these.  Come back in the next 1-2 months for BLOOD PRESSURE.  This will be the MAIN thing that we discuss at that appointment.

## 2010-11-25 NOTE — Assessment & Plan Note (Addendum)
Being followed by [facility]er. Just started on Lamictal.

## 2010-11-25 NOTE — Assessment & Plan Note (Addendum)
Synthroid refilled.

## 2010-11-25 NOTE — Assessment & Plan Note (Addendum)
Discussed with pt again.  Re-explained that nodules are NOT c/w needing biopsy per [place] report.  Pt does NOT have h/o thyroid cancer; had nodules removed b/c of hyperthyroidism.

## 2010-11-25 NOTE — Assessment & Plan Note (Addendum)
Left > Right Reassured patient these are not dangerous.  Told pt to contact dermatologist who does varicose vein treatments.  Emphasized that these are cosmetic only and insurance will NOT cover this procedure.

## 2010-11-25 NOTE — Assessment & Plan Note (Addendum)
Pt is to return for BP only appt since BP was elevated today  To 145/94.

## 2010-11-25 NOTE — Progress Notes (Signed)
S: Pt comes in today for multiple complaints/concerns.  THYROID NODULES Is still concerned about these needing to be biopsied.  Based on previous notes, thyroid surgery was for hyperthyroidism and cells were NOT cancerous.  Based on [place] results, new small nodules are NOT biopsy candidates.  Re-explained this to pt again, and she appeared to be relieved.   FOOT PAIN Pain in her left big toe and on the bottom of her foot, at the pad of her toes, most specifically between 1st/2nd and 2nd/3rd toes.  Hurts most when walking/standing on it.  Does not really wear supportive shoes, usually wears sandals or flats. Does have one pair of "old lady" shoes that seem to help some.   JAW SENSATION This has been going on for 2 years, has not changed, is just annoying.  Has pulling sensations and occasional itching from base of left ear, across jaw, into middle of neck. Pt thinks that it ends where her thyroid is.  Does not really hurt her, but is worried it could be something like cancer and wants to know what it is.  BUMPS IN MOUTH Has had 2 small white bumps on the side of her mouth at the base of her left cheek near her teeth that she is worried could be cancerous since they are white.  No pain, no drainage, have not changed in nature.   BIPOLAR D/O Being seen by [facility]er (previously [facility]). Has true diagnosis of bipolar.  Started on Lamictal last week at last appt, just wanted to let me know.    ROS: Per HPI  History  Smoking status  . Current Everyday Smoker -- 0.1 packs/day  . Types: Cigarettes  Smokeless tobacco  . Never Used  Comment: only smokes 1-2 cig daily. ready to quitt.    O:  Filed Vitals:   11/25/10 1533  BP: 145/90  Pulse: 80    Gen: NAD CV: RRR, no murmur Ext: Warm, no chronic skin changes, no edema; vericose veins, left worse than right -left foot: several small calluses/corns; no active lesions or cuts; TTP over plantar pad b/t 1st/2nd and 2nd/3rd toes and over  big toe; no obvious deformities, no bruising.    A/P: 65 y.o. female p/w multiple complaints -See problem list -f/u in 1-2 months for HTN f/u.

## 2010-11-30 ENCOUNTER — Ambulatory Visit: Payer: Medicare Other | Admitting: Family Medicine

## 2010-12-01 ENCOUNTER — Other Ambulatory Visit: Payer: Self-pay | Admitting: Gastroenterology

## 2010-12-01 DIAGNOSIS — Z1211 Encounter for screening for malignant neoplasm of colon: Secondary | ICD-10-CM

## 2010-12-02 ENCOUNTER — Encounter: Payer: Self-pay | Admitting: Family Medicine

## 2010-12-02 ENCOUNTER — Ambulatory Visit (INDEPENDENT_AMBULATORY_CARE_PROVIDER_SITE_OTHER): Payer: Medicare Other | Admitting: Family Medicine

## 2010-12-02 ENCOUNTER — Ambulatory Visit: Payer: Medicare Other | Admitting: Family Medicine

## 2010-12-02 DIAGNOSIS — K5289 Other specified noninfective gastroenteritis and colitis: Secondary | ICD-10-CM

## 2010-12-02 DIAGNOSIS — K529 Noninfective gastroenteritis and colitis, unspecified: Secondary | ICD-10-CM | POA: Insufficient documentation

## 2010-12-02 MED ORDER — PROMETHAZINE HCL 25 MG RE SUPP: 12.5000 mg | Freq: Four times a day (QID) | RECTAL | Status: DC | PRN

## 2010-12-02 NOTE — Patient Instructions (Addendum)
Drink lots fluids!!! Eat small amount, soft foots until you feel better.  Take phenergan suppositories as directed.  Return if not improving in the next 2-3 days

## 2010-12-02 NOTE — Progress Notes (Signed)
  Subjective:    Patient ID: [name], female    DOB: 22-Aug-1945, 66 y.o.   MRN: [id number]  HPI N/v/d x 1 day: Pt states started after eating supper last night (chicken pie), throwing up 3-4 x each hour, 5-6 loose stools, unable to keep liquids or solids down,  Drinking gingerale and saltines only.  Vomit is yellow in color,  No blood, no fever, denies sick contacts.  Pt had bowel prep and colonoscopy on Monday.    Review of Systems As per above    Objective:   Physical Exam  Constitutional: She is oriented to person, place, and time. She appears well-developed and well-nourished.  HENT:  Head: Normocephalic and atraumatic.  Right Ear: External ear normal.  Left Ear: External ear normal.  Mouth/Throat: Oropharynx is clear and moist.       Mucous membranes moist  Eyes: Right eye exhibits no discharge. Left eye exhibits no discharge. No scleral icterus.  Cardiovascular: Normal rate, regular rhythm and normal heart sounds.   No murmur heard. Pulmonary/Chest: Effort normal and breath sounds normal. No respiratory distress. She has no wheezes. She has no rales.  Abdominal: Soft. She exhibits no distension and no mass. There is no tenderness. There is no rebound and no guarding.  Musculoskeletal: Normal range of motion. She exhibits no edema.  Neurological: She is alert and oriented to person, place, and time.  Skin: No rash noted.  Psychiatric: She has a normal mood and affect. Her behavior is normal.          Assessment & Plan:

## 2010-12-02 NOTE — Assessment & Plan Note (Signed)
Food poisioning vs viral.  Treatment is same. Explained supportive treatment to pt.  (see pt instructions) gave rx for phenergan suppositories.  No vomiting during office visit.

## 2010-12-09 ENCOUNTER — Encounter: Payer: Self-pay | Admitting: Family Medicine

## 2010-12-09 ENCOUNTER — Ambulatory Visit (INDEPENDENT_AMBULATORY_CARE_PROVIDER_SITE_OTHER): Payer: Medicare Other | Admitting: Family Medicine

## 2010-12-09 VITALS — BP 128/81 | HR 86 | Ht <= 58 in | Wt 109.0 lb

## 2010-12-09 DIAGNOSIS — M79609 Pain in unspecified limb: Secondary | ICD-10-CM

## 2010-12-09 DIAGNOSIS — M79673 Pain in unspecified foot: Secondary | ICD-10-CM

## 2010-12-09 NOTE — Patient Instructions (Addendum)
1. Wear your pads in the shoes your wear most often.  2. Follow up with this clinic in 2-3 weeks.

## 2010-12-10 NOTE — Assessment & Plan Note (Signed)
Adding a metatarsal pad to her sandals.  She may have a component of plantar fasciitis.  Due to the small size of her foot the  Metatarsal pad will also give her some arch support so would improve the plantar fascia component as well.  If this improves her pain we will discuss shoes that have this support built in since she is generally adverse to wearing inserts long term.

## 2010-12-10 NOTE — Progress Notes (Signed)
  Subjective:    Patient ID: [name], female    DOB: 21-Mar-1946, 65 y.o.   MRN: [id number]  HPI 65 y/o female with foot pain mostly on the left foot for years.  The pain is worse with walking.  Sometimes she gets numbness in the toes after being on her feet long.  She had a recent flare because she has been reorganizing her house recently.  She normally wears open toe flat sandals.  She doesn't like closed toe shoes because they make her hot.  She doesn't want inserts and she doesn't want any surgical intervention.   Review of Systems     Objective:   Physical Exam  Left foot Loss of the longitudinal arch Loss of the vertical arch with collapse Hammering of the 2 and 3rd toes Medial deviation of the 4th toe  Mild tenderness at the insertion of the plantar fascia on the heel Exquisite tenderness at the heads of the 2-3 metatarsals  Right foot is similar but not as severe      Assessment & Plan:

## 2010-12-28 ENCOUNTER — Ambulatory Visit: Payer: Medicare Other | Admitting: Family Medicine

## 2010-12-28 NOTE — Assessment & Plan Note (Addendum)
Only smokes 1-2 cig/day.

## 2010-12-29 ENCOUNTER — Ambulatory Visit: Payer: Medicare Other | Admitting: Family Medicine

## 2011-01-07 ENCOUNTER — Encounter: Payer: Self-pay | Admitting: Family Medicine

## 2011-01-07 DIAGNOSIS — H9190 Unspecified hearing loss, unspecified ear: Secondary | ICD-10-CM | POA: Insufficient documentation

## 2011-01-14 ENCOUNTER — Encounter: Payer: Medicare Other | Admitting: Vascular Surgery

## 2011-01-14 ENCOUNTER — Other Ambulatory Visit: Payer: Medicare Other

## 2011-01-20 LAB — URINALYSIS, ROUTINE W REFLEX MICROSCOPIC
Bilirubin Urine: NEGATIVE
Glucose, UA: NEGATIVE
Hgb urine dipstick: NEGATIVE
Ketones, ur: NEGATIVE
Nitrite: NEGATIVE
Protein, ur: NEGATIVE
Specific Gravity, Urine: 1.014
Urobilinogen, UA: 0.2
pH: 6

## 2011-01-20 LAB — I-STAT 8, (EC8 V) (CONVERTED LAB)
BUN: 16
Bicarbonate: 26.1 — ABNORMAL HIGH
Chloride: 107
Glucose, Bld: 86
HCT: 44
Hemoglobin: 15
Operator id: 282201
Potassium: 3.6
Sodium: 140
TCO2: 27
pCO2, Ven: 46.3
pH, Ven: 7.359 — ABNORMAL HIGH

## 2011-01-20 LAB — POCT I-STAT CREATININE
Creatinine, Ser: 0.8
Operator id: 282201

## 2011-01-20 LAB — URINE MICROSCOPIC-ADD ON

## 2011-02-04 ENCOUNTER — Encounter: Payer: Medicare Other | Admitting: Vascular Surgery

## 2011-02-04 ENCOUNTER — Other Ambulatory Visit: Payer: Medicare Other

## 2011-02-11 ENCOUNTER — Ambulatory Visit (INDEPENDENT_AMBULATORY_CARE_PROVIDER_SITE_OTHER): Payer: Medicare Other

## 2011-02-11 DIAGNOSIS — Z23 Encounter for immunization: Secondary | ICD-10-CM

## 2011-02-12 ENCOUNTER — Telehealth: Payer: Self-pay | Admitting: Family Medicine

## 2011-02-12 NOTE — Telephone Encounter (Signed)
Patient asking to speak with someone about feeling tightness in her chest after having the flu shot.

## 2011-02-12 NOTE — Telephone Encounter (Signed)
Called patient back and  The person answering phone states he is her ex husband and she is no longer there. She has gone back to her house. She does not have a phone . Advised ex husband to advise patient to go to urgent care if she is has trouble breathing or chest tightness.

## 2011-02-18 ENCOUNTER — Ambulatory Visit (INDEPENDENT_AMBULATORY_CARE_PROVIDER_SITE_OTHER): Payer: Medicare Other | Admitting: Family Medicine

## 2011-02-18 DIAGNOSIS — J069 Acute upper respiratory infection, unspecified: Secondary | ICD-10-CM

## 2011-02-18 MED ORDER — ALBUTEROL SULFATE HFA 108 (90 BASE) MCG/ACT IN AERS: 2.0000 | INHALATION_SPRAY | RESPIRATORY_TRACT | Status: DC | PRN

## 2011-02-18 NOTE — Patient Instructions (Signed)
For cough: Use albuterol every 4 hours as needed for sob or tightness  For runny nose: Use saline nasal spray. Afrin nasal spray- you can use for 3 days but not longer.   For body aches: Ibuprofen as needed

## 2011-02-21 DIAGNOSIS — J069 Acute upper respiratory infection, unspecified: Secondary | ICD-10-CM | POA: Insufficient documentation

## 2011-02-21 NOTE — Assessment & Plan Note (Signed)
Most likely symptoms do to viral etiology. Symptomatic treatment only- see patient instructions. Physical exam reassuring. Pulse ox within normal limits. Reviewed red flags for return. Patient to return if new or worsening symptoms.

## 2011-02-21 NOTE — Progress Notes (Signed)
  Subjective:    Patient ID: [name], female    DOB: 26-Jun-1945, 65 y.o.   MRN: [id number]  HPI Sinus congestion: Patient reports runny nose, positive pressure and for head, positive cough, positive sore throat x3 days. Eating well. No nausea or vomiting. Drinking lots of fluids. No chest pain. No problems with bowel or bladder. Has a history of COPD and cigarette smoking. Not on any chronic steroid inhaler or albuterol inhaler.   Review of Systems As per above    Objective:   Physical Exam  Constitutional: She is oriented to person, place, and time. She appears well-developed and well-nourished.  HENT:  Head: Normocephalic and atraumatic.  Right Ear: External ear normal.  Left Ear: External ear normal.  Mouth/Throat: No oropharyngeal exudate.       Positive mild tenderness with palpation of fore head. Mild throat erythema. TMs within normal limits bilateral-no redness, no bulging.  Eyes: Pupils are equal, round, and reactive to light. Right eye exhibits no discharge.  Cardiovascular: Normal rate and regular rhythm.   Pulmonary/Chest: Effort normal and breath sounds normal. No respiratory distress. She has no wheezes. She has no rales.  Abdominal: Soft. She exhibits no distension. There is no tenderness. There is no rebound.  Lymphadenopathy:    She has no cervical adenopathy.  Neurological: She is alert and oriented to person, place, and time.  Skin: No rash noted.  Psychiatric: She has a normal mood and affect. Her behavior is normal.   Pulse ox 93% on room air Heart rate 86      Assessment & Plan:

## 2011-03-24 ENCOUNTER — Encounter (HOSPITAL_COMMUNITY): Payer: Self-pay | Admitting: Emergency Medicine

## 2011-03-24 ENCOUNTER — Emergency Department (INDEPENDENT_AMBULATORY_CARE_PROVIDER_SITE_OTHER): Payer: Medicare Other

## 2011-03-24 ENCOUNTER — Emergency Department (INDEPENDENT_AMBULATORY_CARE_PROVIDER_SITE_OTHER)
Admission: EM | Admit: 2011-03-24 | Discharge: 2011-03-24 | Disposition: A | Discharge status: Home / Self Care | Payer: Medicare Other | Source: Home / Self Care | Attending: Family Medicine | Admitting: Family Medicine

## 2011-03-24 DIAGNOSIS — M79609 Pain in unspecified limb: Secondary | ICD-10-CM

## 2011-03-24 DIAGNOSIS — M79671 Pain in right foot: Secondary | ICD-10-CM

## 2011-03-24 MED ORDER — PREDNISONE 20 MG PO TABS: 40.0000 mg | ORAL_TABLET | Freq: Every day | ORAL | Status: AC

## 2011-03-24 MED ORDER — TRAMADOL HCL 50 MG PO TABS: 50.0000 mg | ORAL_TABLET | Freq: Four times a day (QID) | ORAL | Status: AC | PRN

## 2011-03-24 NOTE — ED Notes (Addendum)
Pt c/o right foot pain mostly on the top, no swelling noted, movement does not appear restricted. Pain started about 2 months ago and it has gotten worse.

## 2011-03-24 NOTE — ED Provider Notes (Signed)
History     CSN: [id number] Arrival date & time: 03/24/2011  4:27 PM   First MD Initiated Contact with Patient 03/24/11 1614      Chief Complaint  Patient presents with  . Foot Pain    (Consider location/radiation/quality/duration/timing/severity/associated sxs/prior treatment) HPI Comments: [name] presents for evaluation of persistent pain on the dorsum of her RIGHT foot, exacerbated by walking, or any pressure. She denies any injury.   Patient is a 65 y.o. female presenting with lower extremity pain. The history is provided by the patient.  Foot Pain This is a chronic problem. The current episode started more than 1 week ago. The problem occurs constantly. The problem has not changed since onset.The symptoms are aggravated by walking and exertion. The symptoms are relieved by nothing.    Past Medical History  Diagnosis Date  . OCD (obsessive compulsive disorder)   . Depression   . Anxiety   . PUD (peptic ulcer disease) 09/2006    H pylori  . Diverticulosis of colon   . GERD (gastroesophageal reflux disease)   . COPD (chronic obstructive pulmonary disease)     Due to long history of tobacco abuse, still smoking  . History of uterine prolapse 10/2004    Dr Estanislado Pandy  . Thyroid disease     Ho R thyroid noduel plus mild hyperthyroidism. Treated with radioiodine therapty on 04/2006. Dr Linna Hoff.    Past Surgical History  Procedure Date  . Colonoscopy 06/03/2005    Hyperplastic polyps, sigmoid diverticulosis, internal hemorroids  . Ganglion removal 12/2005    R wrist subretinacular dorsal ganglion  . Biopsy thyroid 08/2008    Atypia and Hurtle cells, partial thyroidectomy   . Tonsillectomy and adenoidectomy 10/2004  . Pft 11/2006    Obstructive pattern.  Poor response to bronchodilators.   . Thyroidectomy, partial 08/2008    Dr Bertram Savin  . Transthoracic echocardiogram 09/10/2009    Normal systolic function.  EF 55-60%.  Mild MR, atria ok, trivial pericardial effusion  . Carotid  ultrasound 09/10/09    No significant extracranial carotid artery stenosis, vertebrals are patent with antegrade flow.   . Mri head 09/20/09    No acute intracranial abn.  Signal abnormality suggestive of chronic small vessel ischemia, maximal in the right subinsular white and deep gray mater.   . Tvt 06/15/10    Tension-free Vaginal Tape, Dr Su Hilt, for urge incontinence    Family History  Problem Relation Age of Onset  . Heart disease Mother   . Heart disease Maternal Grandmother   . Heart disease Maternal Grandfather     History  Substance Use Topics  . Smoking status: Current Everyday Smoker -- 0.1 packs/day    Types: Cigarettes  . Smokeless tobacco: Never Used   Comment: only smokes 1-2 cig daily. ready to quitt.  . Alcohol Use: No    OB History    Grav Para Term Preterm Abortions TAB SAB Ect Mult Living                  Review of Systems  Constitutional: Negative.   HENT: Negative.   Eyes: Negative.   Respiratory: Negative.   Cardiovascular: Negative.   Gastrointestinal: Negative.   Genitourinary: Negative.   Musculoskeletal: Positive for gait problem.       RIGHT foot pain  Skin: Negative.   Neurological: Negative.     Allergies  Amoxicillin; Ciprofloxacin; Haloperidol lactate; and Ketorolac tromethamine  Home Medications   Current Outpatient Rx  Name Route Sig  Dispense Refill  . ALBUTEROL SULFATE HFA 108 (90 BASE) MCG/ACT IN AERS Inhalation Inhale 2 puffs into the lungs every 4 (four) hours as needed for wheezing. Please dispense with optichamber- 1 Inhaler 0  . ALENDRONATE SODIUM 70 MG PO TABS Oral Take 1 tablet (70 mg total) by mouth every 7 (seven) days. 4 tablet 11  . VITAMIN C 500 MG PO TABS  Takes 4 every morning     . ASPIRIN 162 MG PO TBEC Oral Take 162 mg by mouth daily.      [name] CALCIUM CARBONATE-VITAMIN D 500-200 MG-UNIT PO TABS Oral Take 1 tablet by mouth 2 (two) times daily.      [name] ESCITALOPRAM OXALATE 10 MG PO TABS Oral Take 10 mg by mouth  daily.    [name] LAMOTRIGINE 25 MG PO TABS Oral Take 1 tablet (25 mg total) by mouth 2 (two) times daily. 60 tablet 2  . LEVOTHYROXINE SODIUM 88 MCG PO TABS Oral Take 1 tablet (88 mcg total) by mouth daily. For thyroid. (new dose) Please keep same manufacturer. 30 tablet 6  . LISINOPRIL 5 MG PO TABS Oral Take 1 tablet (5 mg total) by mouth daily. 30 tablet 6    NEED MORE REFILLS  . PREDNISONE 20 MG PO TABS Oral Take 2 tablets (40 mg total) by mouth daily. Take two tablets each day for five day 10 tablet 0  . SIMVASTATIN 40 MG PO TABS Oral Take 1 tablet (40 mg total) by mouth at bedtime. For cholesterol. 30 tablet 6  . TRAMADOL HCL 50 MG PO TABS Oral Take 1 tablet (50 mg total) by mouth every 6 (six) hours as needed for pain (Take ONLY as needed for severe pain). Maximum dose= 8 tablets per day 15 tablet 0  . TRETINOIN 0.05 % EX CREA      . VENLAFAXINE HCL 75 MG PO CP24 Oral Take 75 mg by mouth 2 (two) times daily.      [name] ZOLPIDEM TARTRATE 10 MG PO TABS  1/4 tab by mouth at bedtime per [facility]       BP 139/85  Pulse 80  Temp(Src) 97.9 F (36.6 C) (Oral)  Resp 17  SpO2 100%  Physical Exam  Nursing note and vitals reviewed. Constitutional: She is oriented to person, place, and time. She appears well-developed and well-nourished.  HENT:  Head: Normocephalic and atraumatic.  Eyes: EOM are normal.  Neck: Normal range of motion.  Pulmonary/Chest: Effort normal.  Musculoskeletal: Normal range of motion.       Feet:  Neurological: She is alert and oriented to person, place, and time.  Skin: Skin is warm and dry.  Psychiatric: Her behavior is normal.    ED Course  Procedures (including critical care time)  Labs Reviewed - No data to display Dg Foot Complete Right  03/24/2011  *RADIOLOGY REPORT*  Clinical Data: Right foot pain  RIGHT FOOT COMPLETE - 3+ VIEW  Comparison: None.  Findings: Bones are diffusely demineralized.  No evidence for acute fracture.  No subluxation or  dislocation.  Degenerative changes are seen in the medial tarsometatarsal joints.  No worrisome lytic or sclerotic osseous lesions.  IMPRESSION: Degenerative changes in the midfoot.  No acute findings.  Original Report Authenticated By: ERIC A. MANSELL, M.D.     1. Pain in right foot       MDM  RIGHT foot xray: degenerative changes in the midfoot        Richardo Priest, MD 03/24/11 1803

## 2011-04-21 DIAGNOSIS — IMO0002 Reserved for concepts with insufficient information to code with codable children: Secondary | ICD-10-CM | POA: Diagnosis not present

## 2011-04-21 DIAGNOSIS — F429 Obsessive-compulsive disorder, unspecified: Secondary | ICD-10-CM | POA: Diagnosis not present

## 2011-04-29 ENCOUNTER — Ambulatory Visit (INDEPENDENT_AMBULATORY_CARE_PROVIDER_SITE_OTHER): Payer: Medicare Other | Admitting: Family Medicine

## 2011-04-29 ENCOUNTER — Encounter: Payer: Self-pay | Admitting: Family Medicine

## 2011-04-29 VITALS — BP 164/95 | HR 85 | Temp 97.6°F | Ht <= 58 in | Wt 115.0 lb

## 2011-04-29 DIAGNOSIS — I10 Essential (primary) hypertension: Secondary | ICD-10-CM | POA: Diagnosis not present

## 2011-04-29 DIAGNOSIS — Z711 Person with feared health complaint in whom no diagnosis is made: Secondary | ICD-10-CM

## 2011-04-29 MED ORDER — LISINOPRIL 5 MG PO TABS: 5.0000 mg | ORAL_TABLET | Freq: Every day | ORAL | Status: DC

## 2011-04-29 NOTE — Progress Notes (Signed)
  Subjective:    Patient ID: [name], female    DOB: 20-Aug-1945, 66 y.o.   MRN: [id number]  HPI Knot on back of neck: Has been present x 1 year.  Small knot at back left hairline.  Has not grown or changed in size.  No opening. No drainage.  No redness.  No rash.    HTN: bp elevated today.  Pt has not taken medications as directed x 2 months.  Pt states she didn't think she needed to take them anymore. No dizziness. No falls. No vision changes.    Review of Systems As per above.     Objective:   Physical Exam  Constitutional: She appears well-developed.  HENT:  Head: Normocephalic and atraumatic.       Small 2-63mm lymph node present at right back of head along hairline in the right neck area.  No tenderness. No local edema or redness.   Cardiovascular: Normal rate.   Pulmonary/Chest: Effort normal. No respiratory distress.  Neurological: She is alert.  Skin: No rash noted.          Assessment & Plan:

## 2011-04-29 NOTE — Patient Instructions (Signed)
Back neck knot: This is a small normal sized lymph node.  W  I want you to return in 2 weeks: Start taking your blood pressure medicine.  I return in 2 weeks for recheck with Dr. Fara Boros.  Discuss with her about your questions about your thyroid nodules.

## 2011-05-01 DIAGNOSIS — Z711 Person with feared health complaint in whom no diagnosis is made: Secondary | ICD-10-CM | POA: Insufficient documentation

## 2011-05-01 NOTE — Assessment & Plan Note (Signed)
Pt off bp medications.  bp elevated.  Educated pt on importance of taking medications.  Pt states understanding and agrees to take them. Pt to return in 2 weeks for bp recheck with pcp.

## 2011-05-01 NOTE — Assessment & Plan Note (Addendum)
Normal sized lymph node in typical location.  No concerns on exam at this time.  Pt reassured.  Pt to continue to monitor and notify pcp if any changes in size or any pain in area.

## 2011-05-17 ENCOUNTER — Ambulatory Visit: Payer: Medicare Other | Admitting: Family Medicine

## 2011-05-23 NOTE — ED Notes (Signed)
Per registration staff patient decided not to stay and be seen after starting registration process.  Pt did not finish process 

## 2011-07-07 ENCOUNTER — Encounter: Payer: Self-pay | Admitting: Family Medicine

## 2011-07-07 ENCOUNTER — Ambulatory Visit (INDEPENDENT_AMBULATORY_CARE_PROVIDER_SITE_OTHER): Payer: Medicare Other | Admitting: Family Medicine

## 2011-07-07 VITALS — BP 119/86 | HR 92 | Ht <= 58 in | Wt 115.0 lb

## 2011-07-07 DIAGNOSIS — E785 Hyperlipidemia, unspecified: Secondary | ICD-10-CM | POA: Diagnosis not present

## 2011-07-07 DIAGNOSIS — M79609 Pain in unspecified limb: Secondary | ICD-10-CM | POA: Diagnosis not present

## 2011-07-07 DIAGNOSIS — I8390 Asymptomatic varicose veins of unspecified lower extremity: Secondary | ICD-10-CM

## 2011-07-07 DIAGNOSIS — I10 Essential (primary) hypertension: Secondary | ICD-10-CM | POA: Diagnosis not present

## 2011-07-07 DIAGNOSIS — M79671 Pain in right foot: Secondary | ICD-10-CM

## 2011-07-07 DIAGNOSIS — E041 Nontoxic single thyroid nodule: Secondary | ICD-10-CM

## 2011-07-07 DIAGNOSIS — M79673 Pain in unspecified foot: Secondary | ICD-10-CM

## 2011-07-07 NOTE — Assessment & Plan Note (Signed)
Well controlled back on medications.  Return for BMET.

## 2011-07-07 NOTE — Assessment & Plan Note (Signed)
Not wearing inserts.  Refuses to return to sports medicine clinic.  Will send to PT.  Wants handicapped placard for this, will f/u PT input on if this is appropriate.

## 2011-07-07 NOTE — Assessment & Plan Note (Signed)
Wants handicap placard for this.  Will send to PT for evaluation since she functionally seems well.
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
## 2011-07-07 NOTE — Assessment & Plan Note (Addendum)
D/w pt again that at last U/S, thyroid nodules were stable. No reason to check TSH at this time as she has no symptoms of hyper or hypothyroidism.  Will continue to monitor.  If nodules enlarging, could consider repeat U/S. NO h/o thyroid cancer.

## 2011-07-07 NOTE — Assessment & Plan Note (Signed)
Will have pt return for FLP

## 2011-07-07 NOTE — Patient Instructions (Signed)
It was good to see you today. I printed a letter so you can have a dog at your apartment.  I am sending you to physical therapy for your foot. The veins on your legs are not dangerous.  You do not need your thyroid biopsied.  It was taken out because it was overactive NOT because there was cancer. There was NO cancer. Come back FASTING (nothing to eat or drink) for some labs in the next few weeks.  Come back and see me in a few months.

## 2011-07-08 ENCOUNTER — Ambulatory Visit (INDEPENDENT_AMBULATORY_CARE_PROVIDER_SITE_OTHER): Payer: Medicare Other | Admitting: Home Health Services

## 2011-07-08 VITALS — BP 131/88 | HR 83 | Temp 98.0°F | Ht <= 58 in | Wt 114.3 lb

## 2011-07-08 DIAGNOSIS — E785 Hyperlipidemia, unspecified: Secondary | ICD-10-CM | POA: Diagnosis not present

## 2011-07-08 DIAGNOSIS — Z0000 Encounter for general adult medical examination without abnormal findings: Secondary | ICD-10-CM | POA: Diagnosis not present

## 2011-07-08 DIAGNOSIS — I10 Essential (primary) hypertension: Secondary | ICD-10-CM | POA: Diagnosis not present

## 2011-07-08 LAB — LIPID PANEL
Cholesterol: 222 mg/dL — ABNORMAL HIGH (ref 0–200)
HDL: 65 mg/dL (ref >39)
LDL Cholesterol: 143 mg/dL — ABNORMAL HIGH (ref 0–99)
Total CHOL/HDL Ratio: 3.4 Ratio
Triglycerides: 71 mg/dL (ref <150)
VLDL: 14 mg/dL (ref 0–40)

## 2011-07-08 LAB — BASIC METABOLIC PANEL
BUN: 12 mg/dL (ref 6–23)
CO2: 27 mEq/L (ref 19–32)
Calcium: 9.1 mg/dL (ref 8.4–10.5)
Chloride: 105 mEq/L (ref 96–112)
Creat: 0.77 mg/dL (ref 0.50–1.10)
Glucose, Bld: 93 mg/dL (ref 70–99)
Potassium: 4.2 mEq/L (ref 3.5–5.3)
Sodium: 141 mEq/L (ref 135–145)

## 2011-07-08 NOTE — Progress Notes (Signed)
Patient here for annual wellness visit, patient reports: Risk Factors/Conditions needing evaluation or treatment: Pt does not have any risk factors that need evaluation. Home Safety: Pt lives by self in senior subsidized apartment.  Other Information: Corrective lens: Pt wears corrective lens for reading. Dentures: Pt does not have dentures does not have a regular dentist visits Memory: Pt denies any memory problems. Patient's Mini Mental Score (recorded in doc. flowsheet): 30  Balance/Gait:  Balance Abnormal Patient value  Sitting balance    Sit to stand    Attempts to arise    Immediate standing balance    Standing balance    Nudge    Eyes closed- Romberg    Tandem stance    Back lean    Neck Rotation x dizziness  360 degree turn    Sitting down     Gait Abnormal Patient value  Initiation of gait    Step length-left    Step length-right    Step height-left    Step height-right    Step symmetry    Step continuity    Path deviation    Trunk movement    Walking stance x Slightly hunched over in upper body      Annual Wellness Visit Requirements Recorded Today In  Medical, family, social history Past Medical, Family, Social History Section  Current providers Care team  Current medications Medications  Wt, BP, Ht, BMI Vital signs  Hearing assessment (welcome visit) Hearing/vision  Tobacco, alcohol, illicit drug use History  ADL Nurse Assessment  Depression Screening Nurse Assessment  Cognitive impairment Nurse Assessment  Mini Mental Status Document Flowsheet  Fall Risk Nurse Assessment  Home Safety Progress Note  End of Life Planning (welcome visit) Social Documentation  Medicare preventative services Progress Note  Risk factors/conditions needing evaluation/treatment Progress Note  Personalized health advice Patient Instructions, goals, letter  Diet & Exercise Social Documentation  Emergency Contact Social Documentation  Seat Belts Social Documentation  Sun  exposure/protection Social Documentation    Prevention Plan:   Recommended Medicare Prevention Screenings Women over 65 Test For Frequency Date of Last- BOLD if needed  Breast Cancer 1-2 yrs 7/09  Cervical Cancer 1-3 yrs Not indicated due to age  Colorectal Cancer 1-10 yrs 8/12  Osteoporosis once discussed  Cholesterol 5 yrs 6/11  Diabetes yearly 6/12  HIV yearly declined  Influenza Shot yearly 11/12  Pneumonia Shot once 8/10  Zostavax Shot once discussed

## 2011-07-09 ENCOUNTER — Encounter: Payer: Self-pay | Admitting: Family Medicine

## 2011-07-09 ENCOUNTER — Encounter: Payer: Self-pay | Admitting: Home Health Services

## 2011-07-09 ENCOUNTER — Other Ambulatory Visit: Payer: Medicare Other

## 2011-07-09 NOTE — Progress Notes (Signed)
ADDENDUM I have reviewed this visit and discussed with Suzanne Lineberry and agree with her documentation  

## 2011-07-09 NOTE — Patient Instructions (Signed)
1. Consider scheduling a mammogram. 2. Consider schedule an appointment with your dentist. 3. Focus on eating 3-5 fruits and vegetables a day. 4. Try limiting the amount of sweets you eat. 5. Consider starting some sort of regular exercise routine.

## 2011-07-20 DIAGNOSIS — IMO0002 Reserved for concepts with insufficient information to code with codable children: Secondary | ICD-10-CM | POA: Diagnosis not present

## 2011-07-20 DIAGNOSIS — F429 Obsessive-compulsive disorder, unspecified: Secondary | ICD-10-CM | POA: Diagnosis not present

## 2011-07-27 ENCOUNTER — Encounter: Payer: Self-pay | Admitting: Family Medicine

## 2011-07-27 ENCOUNTER — Telehealth: Payer: Self-pay | Admitting: *Deleted

## 2011-07-27 ENCOUNTER — Ambulatory Visit (INDEPENDENT_AMBULATORY_CARE_PROVIDER_SITE_OTHER): Payer: Medicare Other | Admitting: Family Medicine

## 2011-07-27 ENCOUNTER — Ambulatory Visit
Admission: RE | Admit: 2011-07-27 | Discharge: 2011-07-27 | Disposition: A | Discharge status: Home / Self Care | Payer: Medicare Other | Source: Ambulatory Visit | Attending: Family Medicine | Admitting: Family Medicine

## 2011-07-27 VITALS — BP 127/80 | HR 105

## 2011-07-27 DIAGNOSIS — M79609 Pain in unspecified limb: Secondary | ICD-10-CM

## 2011-07-27 DIAGNOSIS — M79671 Pain in right foot: Secondary | ICD-10-CM

## 2011-07-27 NOTE — Telephone Encounter (Signed)
Spoke with pt- gave her x-ray results. 

## 2011-07-27 NOTE — Progress Notes (Signed)
  Patient Name: [name] Date of Birth: 02/21/1946 Age: 66 y.o. Medical Record Number: [id number] Gender: female Date of Encounter: 07/27/2011  History of Present Illness:  [name] is a 66 y.o. very pleasant female patient who presents with the following:  F/u R forefoot pain with intermittent swelling. Forefoot and sinus tarsi region. Notable pain in the forefoot. MT pads placed by Dr. Laural Benes did not help. Pt. Feels like she is worse. Occ tingling in toes.   Also c/o r post neck pain for 2-3 days. No radiculopathy  Also asks me about her thyroid nodules.   Past Medical History, Surgical History, Social History, Family History, Problem List, Medications, and Allergies have been reviewed and updated if relevant.  Review of Systems: As above  Physical Examination: Filed Vitals:   07/27/11 1332  BP: 127/80  Pulse: 105    There is no height or weight on file to calculate BMI.   GEN: WDWN, NAD, Non-toxic, Alert & Oriented x 3 HEENT: Atraumatic, Normocephalic.  Ears and Nose: No external deformity. EXTR: No clubbing/cyanosis/edema PSYCH: Normally interactive. Conversant. Not depressed or anxious appearing.  Calm demeanor.   FEET: b sinus tarsi swelling b Edema: no ROM: full LE B Gait: antalgic, favors R foot MT pain: notable on R with squeeze, palpation of 2-4 shafts Lateral Mall: NT Medial Mall: NT Talus: NT Navicular: NT Cuboid: NT Calcaneous: NT Metatarsals: NT 5th MT: NT Phalanges: NT Achilles: NT Plantar Fascia: NT Fat Pad: NT Peroneals: NT Post Tib: NT Great Toe: Nml motion Ant Drawer: neg ATFL: NT CFL: NT Deltoid: NT Other foot breakdown: none Transverse arch: dropped 2-4 met heads Hindfoot breakdown: none Sensation: intact   Assessment and Plan: 1. Foot pain, right  DG Foot Complete Right    Definitely Metatarsalgia component Concerning tenderness on exam, swelling. Check xr, treat as if stress reaction. Post-op shoe x 3 weeks and  recheck  Reassured her about neck, start gentle motion  Thyroid: will defer to PCP, it sounds as if she has had a thyroid biopsy - but patient raises concerns.

## 2011-07-27 NOTE — Telephone Encounter (Signed)
Message copied by Mora Bellman on Tue Jul 27, 2011  4:44 PM ------      Message from: Hannah Beat      Created: Tue Jul 27, 2011  3:13 PM       Please call            No fracture -- i am still suspicious for the stress reaction / swelling in bone, so i want you to wear the post-op shoe for the next 3-4 weeks until I recheck you

## 2011-07-30 ENCOUNTER — Ambulatory Visit (INDEPENDENT_AMBULATORY_CARE_PROVIDER_SITE_OTHER): Payer: Medicare Other | Admitting: Family Medicine

## 2011-07-30 ENCOUNTER — Encounter: Payer: Self-pay | Admitting: Family Medicine

## 2011-07-30 VITALS — BP 125/84 | HR 111 | Temp 97.9°F | Ht <= 58 in | Wt 114.0 lb

## 2011-07-30 DIAGNOSIS — M549 Dorsalgia, unspecified: Secondary | ICD-10-CM | POA: Diagnosis not present

## 2011-07-30 DIAGNOSIS — I8390 Asymptomatic varicose veins of unspecified lower extremity: Secondary | ICD-10-CM

## 2011-07-30 DIAGNOSIS — M545 Low back pain, unspecified: Secondary | ICD-10-CM

## 2011-07-30 DIAGNOSIS — R109 Unspecified abdominal pain: Secondary | ICD-10-CM | POA: Insufficient documentation

## 2011-07-30 LAB — POCT URINALYSIS DIPSTICK
Bilirubin, UA: NEGATIVE
Blood, UA: NEGATIVE
Glucose, UA: NEGATIVE
Ketones, UA: NEGATIVE
Nitrite, UA: NEGATIVE
Protein, UA: NEGATIVE
Spec Grav, UA: <=1.005
Urobilinogen, UA: 0.2
pH, UA: 6

## 2011-07-30 LAB — POCT UA - MICROSCOPIC ONLY

## 2011-07-30 NOTE — Patient Instructions (Signed)
Please take aspirin as needed for this pain Try to stay active We will get your physical therapy referral I have put in your referral to pain clinic Back Pain, Adult Back pain is very common. The pain often gets better over time. The cause of back pain is usually not dangerous. Most people can learn to manage their back pain on their own.   HOME CARE    Stay active. Start with short walks on flat ground if you can. Try to walk farther each day.   Do not sit, drive, or stand in one place for more than 30 minutes. Do not stay in bed.   Do not avoid exercise or work. Activity can help your back heal faster.   Be careful when you bend or lift an object. Bend at your knees, keep the object close to you, and do not twist.   Sleep on a firm mattress. Lie on your side, and bend your knees. If you lie on your back, put a pillow under your knees.   Only take medicines as told by your doctor.   Put ice on the injured area.   Put ice in a plastic bag.   Place a towel between your skin and the bag.   Leave the ice on for 15 to 20 minutes, 3 to 4 times a day for the first 2 to 3 days. After that, you can switch between ice and heat packs.   Ask your doctor about back exercises or massage.   Avoid feeling anxious or stressed. Find good ways to deal with stress, such as exercise.  GET HELP RIGHT AWAY IF:    Your pain does not go away with rest or medicine.   Your pain does not go away in 1 week.   You have new problems.   You do not feel well.   The pain spreads into your legs.   You cannot control when you poop (bowel movement) or pee (urinate).   Your arms or legs feel weak or lose feeling (numbness).   You feel sick to your stomach (nauseous) or throw up (vomit).   You have belly (abdominal) pain.   You feel like you may pass out (faint).  MAKE SURE YOU:    Understand these instructions.   Will watch your condition.   Will get help right away if you are not doing well or  get worse.  Document Released: 09/08/2007 Document Revised: 03/11/2011 Document Reviewed: 08/10/2010 [facility] Patient Information 2012 [place], [place].

## 2011-07-31 NOTE — Progress Notes (Signed)
  Subjective:    Patient ID: [name], female    DOB: October 08, 1945, 66 y.o.   MRN: [id number]  HPI  Pt with pain in low back and side on left.  Started 3 weeks ago. No injury, no fevers, no constipation, no diarrhea, no loss of bowel or bladder, no leg weakness or numbness.  Worse when she is trying to clean her apartment, better with rest.  Better with ASA, which is the only pain med she will take.  No dysuria or frequency.  Hx of incontinence and sling procedure.  Review of Systems See above, otherwise negative    Objective:   Physical Exam  Vital signs reviewed General appearance - alert, well appearing, and in no distress Heart - normal rate, regular rhythm, normal S1, S2, no murmurs, rubs, clicks or gallops Chest - clear to auscultation, no wheezes, rales or rhonchi, symmetric air entry, no tachypnea, retractions or cyanosis MSK- pt is ttp in muscles on left just above pelvis.  No bony tenderness.  Full ROM of back.        Assessment & Plan:

## 2011-07-31 NOTE — Assessment & Plan Note (Signed)
Flank vs low back pain.  Pt with osteoporosis but no bony point tenderness to indicate possible fx.  Small amount of LE on urine, so will send for culture.  No red flags today.  Does not want any meds.  Pt already has referral to PT.  This may be helpful to her.

## 2011-08-01 LAB — URINE CULTURE: Colony Count: 25000

## 2011-08-02 ENCOUNTER — Telehealth: Payer: Self-pay | Admitting: Family Medicine

## 2011-08-02 NOTE — Telephone Encounter (Signed)
Please let patient know that her urine was fine. She is to followup with her regular doctor as scheduled

## 2011-08-02 NOTE — Telephone Encounter (Signed)
Left message on voicemail informing of normal results. 

## 2011-08-04 ENCOUNTER — Other Ambulatory Visit: Payer: Self-pay

## 2011-08-04 DIAGNOSIS — I83893 Varicose veins of bilateral lower extremities with other complications: Secondary | ICD-10-CM

## 2011-08-23 DIAGNOSIS — H2510 Age-related nuclear cataract, unspecified eye: Secondary | ICD-10-CM | POA: Diagnosis not present

## 2011-10-12 DIAGNOSIS — K5730 Diverticulosis of large intestine without perforation or abscess without bleeding: Secondary | ICD-10-CM | POA: Diagnosis not present

## 2011-10-12 DIAGNOSIS — Z1211 Encounter for screening for malignant neoplasm of colon: Secondary | ICD-10-CM | POA: Diagnosis not present

## 2011-10-12 DIAGNOSIS — R141 Gas pain: Secondary | ICD-10-CM | POA: Diagnosis not present

## 2011-10-12 DIAGNOSIS — R1013 Epigastric pain: Secondary | ICD-10-CM | POA: Diagnosis not present

## 2011-10-12 DIAGNOSIS — K219 Gastro-esophageal reflux disease without esophagitis: Secondary | ICD-10-CM | POA: Diagnosis not present

## 2011-10-12 DIAGNOSIS — R1032 Left lower quadrant pain: Secondary | ICD-10-CM | POA: Diagnosis not present

## 2011-10-12 DIAGNOSIS — R143 Flatulence: Secondary | ICD-10-CM | POA: Diagnosis not present

## 2011-10-12 DIAGNOSIS — Z800 Family history of malignant neoplasm of digestive organs: Secondary | ICD-10-CM | POA: Diagnosis not present

## 2011-10-12 DIAGNOSIS — R142 Eructation: Secondary | ICD-10-CM | POA: Diagnosis not present

## 2011-10-19 ENCOUNTER — Encounter: Payer: Medicare Other | Admitting: Vascular Surgery

## 2011-10-26 ENCOUNTER — Encounter: Payer: Medicare Other | Admitting: Vascular Surgery

## 2011-10-27 ENCOUNTER — Encounter: Payer: Self-pay | Admitting: Family Medicine

## 2011-10-27 ENCOUNTER — Ambulatory Visit (INDEPENDENT_AMBULATORY_CARE_PROVIDER_SITE_OTHER): Payer: Medicare Other | Admitting: Family Medicine

## 2011-10-27 VITALS — BP 107/72 | HR 99 | Temp 98.0°F | Ht <= 58 in | Wt 112.0 lb

## 2011-10-27 DIAGNOSIS — J069 Acute upper respiratory infection, unspecified: Secondary | ICD-10-CM

## 2011-10-27 DIAGNOSIS — N6320 Unspecified lump in the left breast, unspecified quadrant: Secondary | ICD-10-CM | POA: Insufficient documentation

## 2011-10-27 DIAGNOSIS — N63 Unspecified lump in unspecified breast: Secondary | ICD-10-CM

## 2011-10-27 MED ORDER — LORATADINE 10 MG PO TABS: 10.0000 mg | ORAL_TABLET | Freq: Every day | ORAL | Status: DC

## 2011-10-27 MED ORDER — FLUTICASONE PROPIONATE 50 MCG/ACT NA SUSP: 2.0000 | Freq: Every day | NASAL | Status: DC

## 2011-10-27 NOTE — Assessment & Plan Note (Addendum)
Could be slowly resolving viral URI vs allergic rhinitis.  Will start claritin and flonase.  Pt informed this may take up to a few weeks to reach its full effect.  Unlikely to be sinusitis, but could do 10 day course of abx +/- po steroids if still having symptoms after using this therapy.  Could also consider vasomotor rhinitis and treat with atrovent nasal spray if continued copious clear d/c.

## 2011-10-27 NOTE — Patient Instructions (Signed)
It was good to see you.  The lump on your breast is just a clogged pore.  Use warm wash clothes to help soften that area up 3 times per day for about 30 minutes.  The black stuff will eventually come out on its own.  For your cold, I think part of it is allergies.  I am going to start you on a by mouth allergy medicine as well as a nose spray that will help with the inflammation in your nose.  You can use saline nasal sprays if you want also.  Come back if your nose and throat aren't getting better in 4-6 weeks.

## 2011-10-27 NOTE — Progress Notes (Signed)
S: Pt comes in today for SDA with multiple complaints.  Patient asked to pick the 2 most important problems to her:  Breast lump Has not had mammogram since 2009.  First noticed it 2 days ago.  Feels firm, like a knot or pimple.  Is black on top.  Tender to touch last night.  No fevers/chills.  No redness or swelling.  No change in 2 days.  No soreness when not touching it or messing with it. No injury or trauma to that area.  No weight changes.  No discharge from nipples.    URI Has had URI symptoms x4 weeks. Started with vertigo then had sore throat and congestion the next morning.  Still has congestion "more in my head than in my chest."  Runny nose and phlegm in throat.  No sneezing but + cough.  Has been using cough drops.  Also having diarrhea with this, which is improving.  Was having some vomiting, which has resolved.  Saw Dr. Loreta Ave, who checked her for H pylori, which was negative.  Fever to 104, no fevers for the past 1-2 weeks.  Some sinus pressure but no pain.  Thinks some of it is allergies; dust makes it worse. Has tried: cough drops, no po or nasal medications.  Not needing albuterol inhaler.    ROS: Per HPI  History  Smoking status  . Current Some Day Smoker -- 0.1 packs/day  . Types: Cigarettes  Smokeless tobacco  . Never Used  Comment: smokes less than 1 pack per week. ready to quitt.    O:  Filed Vitals:   10/27/11 1413  BP: 107/72  Pulse: 99  Temp: 98 F (36.7 C)    Gen: NAD HEENT: PERRLA, EOMI, sclera clear, no pharyngeal erythema or exudate, mild clear rhinorrhea w/o nasal swelling, TMs normal bilaterally, no cervical LAD, no sinus tenderness CV: RRR, no murmur Pulm: CTA bilat, no wheezes or crackles Abd: soft, NT Ext: Warm, no chronic skin changes, no edema Breast: right breast normal with small number of sebKs; left breast with small (5mm) clogged pore, no surrounding erythema or edema, no other lumps felt, no axilary LAD bilaterally    A/P: 66 y.o.  female p/w clogged pore, likely allergic rhinitis -See problem list -f/u PRN

## 2011-10-27 NOTE — Assessment & Plan Note (Addendum)
Clogged pore on left lateral breast. No surrounding cellulitis or infection. Precepted with Dr. Earnest Bailey.  Advised warm compresses until pore opens naturally.

## 2011-11-10 DIAGNOSIS — M25579 Pain in unspecified ankle and joints of unspecified foot: Secondary | ICD-10-CM | POA: Diagnosis not present

## 2011-11-10 DIAGNOSIS — M79609 Pain in unspecified limb: Secondary | ICD-10-CM | POA: Diagnosis not present

## 2011-11-12 ENCOUNTER — Ambulatory Visit: Payer: Medicare Other | Admitting: Family Medicine

## 2011-12-08 ENCOUNTER — Encounter: Payer: Self-pay | Admitting: Family Medicine

## 2011-12-08 ENCOUNTER — Ambulatory Visit (INDEPENDENT_AMBULATORY_CARE_PROVIDER_SITE_OTHER): Payer: Medicare Other | Admitting: Family Medicine

## 2011-12-08 VITALS — BP 156/93 | HR 92 | Ht <= 58 in | Wt 114.0 lb

## 2011-12-08 DIAGNOSIS — E039 Hypothyroidism, unspecified: Secondary | ICD-10-CM

## 2011-12-08 DIAGNOSIS — I10 Essential (primary) hypertension: Secondary | ICD-10-CM

## 2011-12-08 DIAGNOSIS — E041 Nontoxic single thyroid nodule: Secondary | ICD-10-CM | POA: Diagnosis not present

## 2011-12-08 NOTE — Patient Instructions (Signed)
It was good to see you today.  We will check some blood work and get an ultrasound of your thyroid to make sure those are lymph nodes and not thyroid nodules.  Your blood pressure was a little high today-- make sure you are taking your blood pressure medicines  Come back as needed or in 1 month to recheck your blood pressure.

## 2011-12-08 NOTE — Assessment & Plan Note (Signed)
Elevated today, although pt is not always compliant.  Stressed compliance.  Will recheck in 1 month.  BP Readings from Last 3 Encounters:  12/08/11 156/93  10/27/11 107/72  07/30/11 125/84

## 2011-12-08 NOTE — Assessment & Plan Note (Signed)
Feels more like shotty cervical LAD but will recheck TSH and get U/S of neck to r/o new nodules.

## 2011-12-08 NOTE — Progress Notes (Signed)
S: Pt comes in today for thyroid nodule.  Last TSH was normal 09/2010, last U/S 10/2010 showed "Multiple new small nodules in the right lobe of the thyroid gland. Only one of these was visualized on the prior exam. None of the nodules fit criteria for biopsy at this time."  Pt with known thyroid disease-- now hypothyroid on synthroid after h/o R thyroid noduel plus mild hyperthyroidism that was treated with radioiodine therapty in 04/2006 by Dr Linna Hoff.  Currently, pt reports taking her synthroid every day.  She says that she can feel some nodules "pressing on my esophagus." for the past few weeks.  She feels like they are getting larger.  She points to once just under her right jaw line.  She has not had any weight changes, N/V, constipation or diarrhea, or voice changes/hoarseness.  She feels like there is something protruding in her throat when she swallows.  She has never felt this before.   Of note, BP is elevated today. Pt denies CP, SOB, LEE. She reports compliance, although has long history of non-compliance.  Was previously well controlled the last 2 visits.   ROS: Per HPI  History  Smoking status  . Current Some Day Smoker -- 0.1 packs/day  . Types: Cigarettes  Smokeless tobacco  . Never Used  Comment: smokes less than 1 pack per week. ready to quitt.    O:  Filed Vitals:   12/08/11 1350  BP: 156/93  Pulse: 92    Gen: NAD HEENT: PERRLA, EOMI, MMM, no pharyngeal erythema or exudate, ? shotty cervical LAD (1-2 nodes); thyroid non-palpable, does not feel enlarged  CV: RRR, no murmur Pulm: CTA bilat, no wheezes or crackles Abd: soft, NT Ext: Warm, no chronic skin changes, no edema   A/P: 66 y.o. female p/w ?new thyroid nodule -See problem list -f/u in 1 month for BP recheck

## 2011-12-09 ENCOUNTER — Ambulatory Visit
Admission: RE | Admit: 2011-12-09 | Discharge: 2011-12-09 | Disposition: A | Discharge status: Home / Self Care | Payer: Medicare Other | Source: Ambulatory Visit | Attending: Family Medicine | Admitting: Family Medicine

## 2011-12-09 DIAGNOSIS — E039 Hypothyroidism, unspecified: Secondary | ICD-10-CM | POA: Diagnosis not present

## 2011-12-09 DIAGNOSIS — E041 Nontoxic single thyroid nodule: Secondary | ICD-10-CM | POA: Diagnosis not present

## 2011-12-09 LAB — TSH: TSH: 76.935 u[IU]/mL — ABNORMAL HIGH (ref 0.350–4.500)

## 2011-12-10 ENCOUNTER — Telehealth: Payer: Self-pay | Admitting: Family Medicine

## 2011-12-10 DIAGNOSIS — E041 Nontoxic single thyroid nodule: Secondary | ICD-10-CM

## 2011-12-10 DIAGNOSIS — E039 Hypothyroidism, unspecified: Secondary | ICD-10-CM

## 2011-12-10 MED ORDER — LEVOTHYROXINE SODIUM 88 MCG PO TABS: 88.0000 ug | ORAL_TABLET | Freq: Every day | ORAL | Status: DC

## 2011-12-10 NOTE — Telephone Encounter (Signed)
Refilled synthroid

## 2011-12-10 NOTE — Telephone Encounter (Signed)
Gave patient message and she will have to call back to let us know on Monday about her synthroid.  She is out today and wanted a refill

## 2011-12-10 NOTE — Telephone Encounter (Signed)
Left message for patient to return call. Please see message from Dr McGill.Busick, Robert Lee  

## 2011-12-10 NOTE — Telephone Encounter (Signed)
Please call pt and ask her how much synthroid she is taking an how often.  Her TSH was very high, making me think either she has been missing some doses or her medication dose is not high enough.  I do not want to increase her medicine if she has missed some doses (TSH was normal on this dose 09/2010).   Also, her ultrasound did NOT show any new or concerning nodules in her thyroid.  Thanks!

## 2011-12-16 ENCOUNTER — Telehealth: Payer: Self-pay | Admitting: Family Medicine

## 2011-12-16 NOTE — Telephone Encounter (Signed)
Please call her back regarding the thyroid and the other tests that she had.  Did not understand what all was said when she was called originally.  Did pick up her medicine and is taking it daily.

## 2011-12-17 DIAGNOSIS — D235 Other benign neoplasm of skin of trunk: Secondary | ICD-10-CM | POA: Diagnosis not present

## 2011-12-17 NOTE — Telephone Encounter (Signed)
Called patient, she admitted to running out of her synthroid several days before the blood work on 9/4. Told patient to make sure and take it every day and to keep her follow up appointment in October, also to let us know at least 1 week before she runs out again. She also want Dr Fara Boros to go over the results of the U/S with her.Milbert Bixler, Rodena Medin

## 2011-12-24 ENCOUNTER — Telehealth: Payer: Self-pay | Admitting: Family Medicine

## 2011-12-24 NOTE — Telephone Encounter (Signed)
Pt is asking for results of her [place] - has never heard anything

## 2011-12-24 NOTE — Telephone Encounter (Signed)
Called pt. Informed of thyroid US results. Pt verbalized understanding. Lorenda Hatchet, Renato Battles

## 2011-12-30 DIAGNOSIS — F429 Obsessive-compulsive disorder, unspecified: Secondary | ICD-10-CM | POA: Diagnosis not present

## 2011-12-30 DIAGNOSIS — IMO0002 Reserved for concepts with insufficient information to code with codable children: Secondary | ICD-10-CM | POA: Diagnosis not present

## 2012-01-03 ENCOUNTER — Other Ambulatory Visit: Payer: Self-pay | Admitting: *Deleted

## 2012-01-03 DIAGNOSIS — I10 Essential (primary) hypertension: Secondary | ICD-10-CM

## 2012-01-03 MED ORDER — LISINOPRIL 5 MG PO TABS: 5.0000 mg | ORAL_TABLET | Freq: Every day | ORAL | Status: DC

## 2012-01-09 ENCOUNTER — Encounter (HOSPITAL_COMMUNITY): Payer: Self-pay | Admitting: Emergency Medicine

## 2012-01-09 ENCOUNTER — Emergency Department (HOSPITAL_COMMUNITY)
Admission: EM | Admit: 2012-01-09 | Discharge: 2012-01-09 | Disposition: A | Discharge status: Home / Self Care | Payer: Medicare Other | Source: Home / Self Care | Attending: Emergency Medicine | Admitting: Emergency Medicine

## 2012-01-09 DIAGNOSIS — K5289 Other specified noninfective gastroenteritis and colitis: Secondary | ICD-10-CM | POA: Diagnosis not present

## 2012-01-09 DIAGNOSIS — K529 Noninfective gastroenteritis and colitis, unspecified: Secondary | ICD-10-CM

## 2012-01-09 LAB — POCT I-STAT, CHEM 8
BUN: 13 mg/dL (ref 6–23)
Calcium, Ion: 1.22 mmol/L (ref 1.13–1.30)
Chloride: 105 mEq/L (ref 96–112)
Creatinine, Ser: 1.1 mg/dL (ref 0.50–1.10)
Glucose, Bld: 126 mg/dL — ABNORMAL HIGH (ref 70–99)
HCT: 41 % (ref 36.0–46.0)
Hemoglobin: 13.9 g/dL (ref 12.0–15.0)
Potassium: 3.6 mEq/L (ref 3.5–5.1)
Sodium: 144 mEq/L (ref 135–145)
TCO2: 26 mmol/L (ref 0–100)

## 2012-01-09 MED ORDER — ONDANSETRON 4 MG PO TBDP
ORAL_TABLET | ORAL | Status: AC
  Filled 2012-01-09: qty 1

## 2012-01-09 MED ORDER — ONDANSETRON 4 MG PO TBDP
8.0000 mg | ORAL_TABLET | Freq: Once | ORAL | Status: AC
  Administered 2012-01-09: 8 mg via ORAL

## 2012-01-09 MED ORDER — ONDANSETRON 8 MG PO TBDP: 8.0000 mg | ORAL_TABLET | Freq: Three times a day (TID) | ORAL | Status: DC | PRN

## 2012-01-09 NOTE — ED Notes (Signed)
Pt c/o vomiting since yesterday evening. Pt states that she has been feeling nauseas constantly causing her to loose her appetite. Pt denies any other symptoms Pt also c/o soreness of her right foot in the center x several days. Small swollen area with some redness.

## 2012-01-09 NOTE — ED Provider Notes (Signed)
Chief Complaint  Patient presents with  . Emesis    History of Present Illness:   [name] is a 66 year old female who has been seen many times by me in the past. She presents today with a history since yesterday afternoon of nausea and vomiting. She states she vomited about 25 times yesterday and 7 times today. No blood in the emesis or coffee-ground emesis or bilious emesis she denies any fever, chills, or sweats. She's had no headache or URI symptoms. Denies any coughing. She had some pain in her right flank and her back last night but none right now. She's had a few loose stools but nothing severe. No blood in the stool or melanotic stool. She's not been exposed to any sick contacts, no foreign travel, no suspicious ingestions or animal exposure.  Review of Systems:  Other than noted above, the patient denies any of the following symptoms: Systemic:  No fevers, chills, sweats, weight loss or gain, fatigue, or tiredness. ENT:  No nasal congestion, rhinorrhea, or sore throat. Lungs:  No cough, wheezing, or shortness of breath. Cardiac:  No chest pain, syncope, or presyncope. GI:  No abdominal pain, nausea, vomiting, anorexia, diarrhea, constipation, blood in stool or vomitus. GU:  No dysuria, frequency, or urgency.  PMFSH:  Past medical history, family history, social history, meds, and allergies were reviewed.  Physical Exam:   Vital signs:  BP 109/76  Pulse 93  Temp 98 F (36.7 C) (Oral)  Resp 16  SpO2 96%  LMP 01/09/2012 Filed Vitals:   01/09/12 1620 01/09/12 1658 Supine  01/09/12 1659 Sitting  01/09/12 1700 Standing   BP: 128/75 113/62 114/76 109/76  Pulse: 85 64 77 93  Temp: 98 F (36.7 C)     TempSrc: Oral     Resp: 16     SpO2: 96%      General:  Alert and oriented.  In no distress.  Skin warm and dry.  Good skin turgor, brisk capillary refill. ENT:  No scleral icterus, moist mucous membranes, no oral lesions, pharynx clear. Lungs:  Breath sounds clear and equal  bilaterally.  No wheezes, rales, or rhonchi. Heart:  Rhythm regular, without extrasystoles.  No gallops or murmers. Abdomen:  Abdomen was soft, flat, nondistended. There was no tenderness, guarding, or rebound. Bowel sounds were loud and hyperactive. No organomegaly or mass. No pulsatile midline abdominal mass or bruit. Skin: Clear, warm, and dry.  Good turgor.  Brisk capillary refill.  Labs:   Results for orders placed during the hospital encounter of 01/09/12  POCT I-STAT, CHEM 8      Component Value Range   Sodium 144  135 - 145 mEq/L   Potassium 3.6  3.5 - 5.1 mEq/L   Chloride 105  96 - 112 mEq/L   BUN 13  6 - 23 mg/dL   Creatinine, Ser 2.84  0.50 - 1.10 mg/dL   Glucose, Bld 132 (*) 70 - 99 mg/dL   Calcium, Ion 4.40  1.02 - 1.30 mmol/L   TCO2 26  0 - 100 mmol/L   Hemoglobin 13.9  12.0 - 15.0 g/dL   HCT 72.5  36.6 - 44.0 %     Course in Urgent Care Center:   She was given Zofran 8 mg sublingually and tolerated this well.  Assessment:  The encounter diagnosis was Gastroenteritis.  Plan:   1.  The following meds were prescribed:   New Prescriptions   ONDANSETRON (ZOFRAN ODT) 8 MG DISINTEGRATING TABLET  Take 1 tablet (8 mg total) by mouth every 8 (eight) hours as needed for nausea.   2.  The patient was instructed in symptomatic care and handouts were given. 3.  The patient was told to return if becoming worse in any way, if no better in 2 or 3 days, and given some red flag symptoms that would indicate earlier return. 4.  The patient was told to take only sips of clear liquids for the next 24 hours and then advance to a b.r.a.t. Diet.      Reuben Likes, MD 01/09/12 [phone]

## 2012-01-12 ENCOUNTER — Encounter: Payer: Self-pay | Admitting: Family Medicine

## 2012-01-12 ENCOUNTER — Ambulatory Visit (INDEPENDENT_AMBULATORY_CARE_PROVIDER_SITE_OTHER): Payer: Medicare Other | Admitting: Family Medicine

## 2012-01-12 VITALS — BP 135/85 | HR 112 | Ht <= 58 in | Wt 115.8 lb

## 2012-01-12 DIAGNOSIS — E785 Hyperlipidemia, unspecified: Secondary | ICD-10-CM | POA: Diagnosis not present

## 2012-01-12 DIAGNOSIS — I10 Essential (primary) hypertension: Secondary | ICD-10-CM

## 2012-01-12 DIAGNOSIS — E039 Hypothyroidism, unspecified: Secondary | ICD-10-CM

## 2012-01-12 DIAGNOSIS — E041 Nontoxic single thyroid nodule: Secondary | ICD-10-CM | POA: Diagnosis not present

## 2012-01-12 NOTE — Patient Instructions (Addendum)
It was good to see you today.  Keep taking your synthroid every day.  We will recheck your thyroid function in about 1 month.  Restart your cholesterol medicine- your cholesterol was high in April.  Come back to see me in about 1 month.

## 2012-01-12 NOTE — Progress Notes (Signed)
S: Pt comes in today for f/u.  HYPERTENSION BP: 135/85 Meds: lisinopril 5 Taking meds:   yes  # of doses missed/week: 0 Symptoms: Headache: No Dizziness: No Vision changes: No SOB:  No Chest pain: No LE swelling: No Tobacco use: Yes 1 cig/day   THYROID Wants to discuss U/S results from 12/09/11: Small right mid/lower thyroid nodules measuring up to 7 mm, unchanged. Left thyroid is surgically absent. Pt is currently on synthroid with poor compliance. Last TSH 76 ~1 month ago, but pt reports taking no medication for at least 1 week prior to this.     ROS: Per HPI  History  Smoking status  . Current Some Day Smoker -- 0.1 packs/day  . Types: Cigarettes  Smokeless tobacco  . Never Used  Comment: smokes less than 1 pack per week. ready to quitt.    O:  Filed Vitals:   01/12/12 1416  BP: 135/85  Pulse: 112    Gen: NAD CV: RRR, no murmur Pulm: CTA bilat, no wheezes or crackles Ext: Warm, no chronic skin changes, no edema   A/P: 66 y.o. female p/w HTN, thyroid nodule (stable) -See problem list -f/u in 1 month

## 2012-01-12 NOTE — Assessment & Plan Note (Signed)
Recheck TSH in 1 month

## 2012-01-12 NOTE — Assessment & Plan Note (Signed)
BP acceptable today. Cont lisinopril 5. F/u 1 month.

## 2012-01-12 NOTE — Assessment & Plan Note (Signed)
Advised pt to start statin. Consider checking LDL vs FLP in 6 months (07/2012)

## 2012-01-12 NOTE — Assessment & Plan Note (Signed)
U/S unchanged from past 2 years, 1 7mm nodule on right (left thyroid surgically removed).

## 2012-01-19 ENCOUNTER — Telehealth: Payer: Self-pay | Admitting: Clinical

## 2012-01-19 NOTE — Telephone Encounter (Signed)
Clinical Social Worker (CSW) has scheduled pt for an apptointment to discuss advanced directives, per pt request. Pt has been scheduled for Friday, January 21, 2012 at 2:30pm.   Theresia Bough, MSW, Theresia Majors [phone]

## 2012-01-21 ENCOUNTER — Encounter: Payer: Self-pay | Admitting: Clinical

## 2012-01-21 NOTE — Progress Notes (Signed)
Pt arrived for her appointment with CSW to complete an advanced directives packet. CSW observed that pt was very unsure about who to designate as her HCPOA and whether she did want certain life prolonging measures. CSW answered all of pt questions and encouraged pt to think about her wishes and ask who she wishes to designate as her HCPOA. Pt appreciative and agreeable to read over packet and contact CSW when she is ready to complete it.  Theresia Bough, MSW, Theresia Majors [phone]

## 2012-01-30 ENCOUNTER — Encounter (HOSPITAL_COMMUNITY): Payer: Self-pay | Admitting: Emergency Medicine

## 2012-01-30 ENCOUNTER — Emergency Department (HOSPITAL_COMMUNITY)
Admission: EM | Admit: 2012-01-30 | Discharge: 2012-01-31 | Discharge status: Against Medical Advice | Payer: Medicare Other | Attending: Emergency Medicine | Admitting: Emergency Medicine

## 2012-01-30 DIAGNOSIS — R110 Nausea: Secondary | ICD-10-CM | POA: Insufficient documentation

## 2012-01-30 NOTE — ED Notes (Signed)
Pt stating it is getting dark and she wants to leave.  Advised pt of the risks for her of LWBS.  Asked pt how she is feeling.  Pt states she doesn't feel well.  According to triage notes pt seen 3 wks ago for flu like symptoms.  Pt came in with c/o nausea.  No vomiting while in waiting.  Pt states she will wait a little longer.

## 2012-01-30 NOTE — ED Notes (Addendum)
Pt went to Dr. Lorenz Coaster in urgent care about 3 weeks ago for flu like symptoms. She returns to the ED for similar flu like symptoms along with body aches. Pt c/o current muscle back pain today. Pt A&Ox4, ambulates. Pt also mentions she has lymph nodes that she believes are increasing in size at her neck.

## 2012-01-30 NOTE — ED Notes (Signed)
Pt decided not to stay.  Pt states she will follow up at [facility] tomorrow.

## 2012-02-01 ENCOUNTER — Telehealth: Payer: Self-pay | Admitting: Family Medicine

## 2012-02-01 NOTE — Telephone Encounter (Signed)
Would like script for shingles shot at [facility]

## 2012-02-01 NOTE — Telephone Encounter (Signed)
Forward to PCP for Rx, [facility] send to [facility] [email or web address]

## 2012-02-02 MED ORDER — ZOSTER VACCINE LIVE 19400 UNT/0.65ML ~~LOC~~ SOLR: 0.6500 mL | Freq: Once | SUBCUTANEOUS | Status: DC

## 2012-02-02 NOTE — Telephone Encounter (Signed)
Sent. Thanks.   

## 2012-02-09 ENCOUNTER — Ambulatory Visit: Payer: Medicare Other | Admitting: Family Medicine

## 2012-02-09 DIAGNOSIS — M79609 Pain in unspecified limb: Secondary | ICD-10-CM | POA: Diagnosis not present

## 2012-02-09 DIAGNOSIS — G5760 Lesion of plantar nerve, unspecified lower limb: Secondary | ICD-10-CM | POA: Diagnosis not present

## 2012-02-09 DIAGNOSIS — D2370 Other benign neoplasm of skin of unspecified lower limb, including hip: Secondary | ICD-10-CM | POA: Diagnosis not present

## 2012-02-09 DIAGNOSIS — M19079 Primary osteoarthritis, unspecified ankle and foot: Secondary | ICD-10-CM | POA: Diagnosis not present

## 2012-02-11 ENCOUNTER — Inpatient Hospital Stay (HOSPITAL_COMMUNITY)
Admission: AD | Admit: 2012-02-11 | Discharge: 2012-02-11 | Disposition: A | Discharge status: Home / Self Care | Payer: Medicare Other | Source: Ambulatory Visit | Attending: Obstetrics & Gynecology | Admitting: Obstetrics & Gynecology

## 2012-02-11 ENCOUNTER — Encounter (HOSPITAL_COMMUNITY): Payer: Self-pay | Admitting: *Deleted

## 2012-02-11 DIAGNOSIS — L739 Follicular disorder, unspecified: Secondary | ICD-10-CM

## 2012-02-11 DIAGNOSIS — M79609 Pain in unspecified limb: Secondary | ICD-10-CM

## 2012-02-11 DIAGNOSIS — L738 Other specified follicular disorders: Secondary | ICD-10-CM | POA: Insufficient documentation

## 2012-02-11 DIAGNOSIS — N63 Unspecified lump in unspecified breast: Secondary | ICD-10-CM | POA: Diagnosis not present

## 2012-02-11 DIAGNOSIS — Z1231 Encounter for screening mammogram for malignant neoplasm of breast: Secondary | ICD-10-CM

## 2012-02-11 DIAGNOSIS — G8929 Other chronic pain: Secondary | ICD-10-CM

## 2012-02-11 HISTORY — DX: Cardiac murmur, unspecified: R01.1

## 2012-02-11 HISTORY — DX: Age-related osteoporosis without current pathological fracture: M81.0

## 2012-02-11 NOTE — MAU Note (Signed)
Has something in left axillary area- "needs to be squeezed or  Something to get the stuff out". Had mentioned it to family medicine. Thinks might had a lump in left breast, has not had a mammogram in 2 yrs.  Also having pain in rt calf when walking, esp walking up hill.  ? Whether has a blockage, thinks might need a stress test. Started a long time ago, (when was at [facility]); thinks was there for a possible prolapse.- states had a very painful exam; felt like they were pulling from the inside, was very uncomfortable.  Later was diagnosed with prolapse and had a surgery/ sling.  Doesn't feel like surgery  Helped, has problems with incontinence.

## 2012-02-11 NOTE — MAU Provider Note (Signed)
History     CSN: [id number]  Arrival date and time: 02/11/12 0850   None     Chief Complaint  Patient presents with  . Breast Mass   HPI [name] is a 66 y.o. female who presents to MAU with breast lump. She is not sure how long the area has been there. She describes the area as raised with black head. She was evaluated by her PCP @ [facility] and told the area was not infected but she was due a routine mammogram. Patient was to call and schedule an appointment but she came here to get everything done today.  She also complains of right lower extremity pain that has been off and on for "a while". The pain increases with walking up hills. She also complains of  "feet problems" that have been ongoing.  She saw the foot doctor last week.  The history was provided by the patient.    Past Medical History  Diagnosis Date  . OCD (obsessive compulsive disorder)   . Depression   . Anxiety   . PUD (peptic ulcer disease) 09/2006    H pylori  . Diverticulosis of colon   . GERD (gastroesophageal reflux disease)   . COPD (chronic obstructive pulmonary disease)     Due to long history of tobacco abuse, still smoking  . History of uterine prolapse 10/2004    Dr Estanislado Pandy  . Thyroid disease     Ho R thyroid noduel plus mild hyperthyroidism. Treated with radioiodine therapty on 04/2006. Dr Linna Hoff.    Past Surgical History  Procedure Date  . Colonoscopy 06/03/2005    Hyperplastic polyps, sigmoid diverticulosis, internal hemorroids  . Ganglion removal 12/2005    R wrist subretinacular dorsal ganglion  . Biopsy thyroid 08/2008    Atypia and Hurtle cells, partial thyroidectomy   . Tonsillectomy and adenoidectomy 10/2004  . Pft 11/2006    Obstructive pattern.  Poor response to bronchodilators.   . Thyroidectomy, partial 08/2008    Dr Bertram Savin  . Transthoracic echocardiogram 09/10/2009    Normal systolic function.  EF 55-60%.  Mild MR, atria ok, trivial pericardial effusion    . Carotid ultrasound 09/10/09    No significant extracranial carotid artery stenosis, vertebrals are patent with antegrade flow.   . Mri head 09/20/09    No acute intracranial abn.  Signal abnormality suggestive of chronic small vessel ischemia, maximal in the right subinsular white and deep gray mater.   . Tvt 06/15/10    Tension-free Vaginal Tape, Dr Su Hilt, for urge incontinence    Family History  Problem Relation Age of Onset  . Heart disease Mother   . Heart disease Maternal Grandmother   . Heart disease Maternal Grandfather   . Cirrhosis Sister   . Hypertension Sister     History  Substance Use Topics  . Smoking status: Current Some Day Smoker -- 0.1 packs/day    Types: Cigarettes  . Smokeless tobacco: Never Used     Comment: smokes less than 1 pack per week. ready to quitt.  . Alcohol Use: No    Allergies:  Allergies  Allergen Reactions  . Amoxicillin     REACTION: unspecified  . Ciprofloxacin     REACTION: unspecified  . Haloperidol Lactate     REACTION: unspecified  . Ketorolac Tromethamine     REACTION: unspecified    Prescriptions prior to admission  Medication Sig Dispense Refill  . albuterol (PROVENTIL HFA;VENTOLIN HFA) 108 (90  BASE) MCG/ACT inhaler Inhale 2 puffs into the lungs every 4 (four) hours as needed for wheezing. Please dispense with optichamber-  1 Inhaler  0  . alendronate (FOSAMAX) 70 MG tablet Take 1 tablet (70 mg total) by mouth every 7 (seven) days.  4 tablet  11  . Ascorbic Acid (VITAMIN C) 500 MG tablet Takes 4 every morning       . aspirin (HALFPRIN) 162 MG EC tablet Take 162 mg by mouth daily.        . calcium-vitamin D (SM CALCIUM-VITAMIN D) 500-200 MG-UNIT per tablet Take 1 tablet by mouth 2 (two) times daily.        [name] escitalopram (LEXAPRO) 10 MG tablet Take 10 mg by mouth daily.      . fluticasone (FLONASE) 50 MCG/ACT nasal spray Place 2 sprays into the nose daily.  16 g  2  . lamoTRIgine (LAMICTAL) 25 MG tablet Take 1 tablet (25 mg  total) by mouth 2 (two) times daily.  60 tablet  2  . levothyroxine (SYNTHROID, LEVOTHROID) 88 MCG tablet Take 1 tablet (88 mcg total) by mouth daily. For thyroid.  Please keep same manufacturer.  30 tablet  6  . lisinopril (PRINIVIL,ZESTRIL) 5 MG tablet Take 1 tablet (5 mg total) by mouth daily.  30 tablet  6  . loratadine (CLARITIN) 10 MG tablet Take 1 tablet (10 mg total) by mouth daily.  30 tablet  11  . ondansetron (ZOFRAN ODT) 8 MG disintegrating tablet Take 1 tablet (8 mg total) by mouth every 8 (eight) hours as needed for nausea.  20 tablet  0  . simvastatin (ZOCOR) 40 MG tablet Take 1 tablet (40 mg total) by mouth at bedtime. For cholesterol.  30 tablet  6  . tretinoin (RETIN-A) 0.05 % cream       . venlafaxine (EFFEXOR-XR) 75 MG 24 hr capsule Take 75 mg by mouth 2 (two) times daily.        [name] zolpidem (AMBIEN) 10 MG tablet 1/4 tab by mouth at bedtime per [facility]       . zoster vaccine live, PF, (ZOSTAVAX) 64332 UNT/0.65ML injection Inject 19,400 Units into the skin once.  1 each  0    Review of Systems  Constitutional: Negative for fever and chills.  Respiratory: Negative for cough.   Cardiovascular: Negative for leg swelling.  Gastrointestinal: Negative for nausea and vomiting.  Genitourinary: Negative for dysuria and urgency.  Musculoskeletal:       Leg pain with going up hills, bilateral foot pain that is chronic.  Skin: Negative for rash.  Neurological: Negative for dizziness and headaches.  Psychiatric/Behavioral:       History of depression, OCD, Bipolar   Physical Exam   Blood pressure 148/85, pulse 76, temperature 97.1 F (36.2 C), temperature source Oral, resp. rate 20, last menstrual period 01/09/2012.  Physical Exam  Nursing note and vitals reviewed. Constitutional: She is oriented to person, place, and time. She appears well-developed and well-nourished. No distress.  HENT:  Head: Normocephalic and atraumatic.  Eyes: EOM are normal.  Neck: Neck supple.   Cardiovascular: Normal rate.   Respiratory: Effort normal.         Small pimple area with black head. No mass palpated on exam.  Musculoskeletal: Normal range of motion. She exhibits no edema and no tenderness.       No calf pain on exam. Adequate circulation, pedal pulses present.  Neurological: She is alert and oriented to person, place, and  time.  Skin: Skin is warm and dry.  Psychiatric: She has a normal mood and affect. Her behavior is normal. Judgment and thought content normal.   Assessment:  66 y.o. female with prob. Infected hair follicle left breast.   Chronic foot pain and leg pain  Plan: Discussed patient with Dr. Fara Boros, patient's PCP, she has recently evaluated the patient and is aware of all of the complaints that the patient has. Dr. Fara Boros states that all the problems are chronic. She will see the patient in the office on Monday or Tuesday for follow up.  Mammogram scheduled for December 3rd If patient has problems before then she should go to [facility] ED for evaluation.   Discussed with the patient and all questioned fully answered.   Medication List     As of 02/11/2012 10:42 AM    CONTINUE taking these medications         albuterol 108 (90 BASE) MCG/ACT inhaler   Commonly known as: PROVENTIL HFA;VENTOLIN HFA   Inhale 2 puffs into the lungs every 4 (four) hours as needed for wheezing. Please dispense with optichamber-      HALFPRIN 162 MG EC tablet   Generic drug: aspirin      levothyroxine 88 MCG tablet   Commonly known as: SYNTHROID, LEVOTHROID   Take 1 tablet (88 mcg total) by mouth daily. For thyroid.  Please keep same manufacturer.      lisinopril 5 MG tablet   Commonly known as: PRINIVIL,ZESTRIL   Take 1 tablet (5 mg total) by mouth daily.      ondansetron 8 MG disintegrating tablet   Commonly known as: ZOFRAN-ODT   Take 1 tablet (8 mg total) by mouth every 8 (eight) hours as needed for nausea.      SM CALCIUM-VITAMIN D 500-200 MG-UNIT per  tablet   Generic drug: calcium-vitamin D      venlafaxine XR 75 MG 24 hr capsule   Commonly known as: EFFEXOR-XR      vitamin C 500 MG tablet   Commonly known as: ASCORBIC ACID         MAU Course  Procedures Malayia Spizzirri, RN, FNP, BC 02/11/2012, 9:29 AM

## 2012-02-16 ENCOUNTER — Ambulatory Visit: Payer: Medicare Other | Admitting: Family Medicine

## 2012-03-01 ENCOUNTER — Ambulatory Visit (HOSPITAL_COMMUNITY): Payer: Medicare Other | Attending: Family Medicine

## 2012-03-06 ENCOUNTER — Encounter: Payer: Self-pay | Admitting: Home Health Services

## 2012-03-07 ENCOUNTER — Ambulatory Visit (HOSPITAL_COMMUNITY): Payer: Medicare Other

## 2012-03-23 DIAGNOSIS — IMO0002 Reserved for concepts with insufficient information to code with codable children: Secondary | ICD-10-CM | POA: Diagnosis not present

## 2012-03-23 DIAGNOSIS — F429 Obsessive-compulsive disorder, unspecified: Secondary | ICD-10-CM | POA: Diagnosis not present

## 2012-05-30 DIAGNOSIS — K5900 Constipation, unspecified: Secondary | ICD-10-CM | POA: Diagnosis not present

## 2012-05-30 DIAGNOSIS — R1013 Epigastric pain: Secondary | ICD-10-CM | POA: Diagnosis not present

## 2012-05-30 DIAGNOSIS — K5730 Diverticulosis of large intestine without perforation or abscess without bleeding: Secondary | ICD-10-CM | POA: Diagnosis not present

## 2012-05-30 DIAGNOSIS — R141 Gas pain: Secondary | ICD-10-CM | POA: Diagnosis not present

## 2012-06-01 ENCOUNTER — Telehealth: Payer: Self-pay | Admitting: Family Medicine

## 2012-06-01 ENCOUNTER — Encounter: Payer: Self-pay | Admitting: Family Medicine

## 2012-06-01 ENCOUNTER — Ambulatory Visit (INDEPENDENT_AMBULATORY_CARE_PROVIDER_SITE_OTHER): Payer: Medicare Other | Admitting: Family Medicine

## 2012-06-01 VITALS — BP 132/95 | HR 104 | Temp 97.8°F | Ht <= 58 in | Wt 114.0 lb

## 2012-06-01 DIAGNOSIS — H9209 Otalgia, unspecified ear: Secondary | ICD-10-CM

## 2012-06-01 DIAGNOSIS — H9201 Otalgia, right ear: Secondary | ICD-10-CM

## 2012-06-01 MED ORDER — FLUTICASONE PROPIONATE 50 MCG/ACT NA SUSP: 2.0000 | Freq: Every day | NASAL | Status: DC

## 2012-06-01 NOTE — Patient Instructions (Addendum)
I do not see a reason for your ear pain. No evidence of infection. You may have a neuralgia or nerve pain, which usually goes away. If you have any vision loss, headache, fever chills or pain worsens then come back to doctor.

## 2012-06-01 NOTE — Assessment & Plan Note (Signed)
Etiology is not apparent on today's exam. No sign of otitis media/externa, zoster, TMJ, temporal arteritis. As patient described, could be some type of cranial neuralgia with uncertain trigger. There could possibly be a middle ear effusion, which would be difficult to tell on exam, so we discussed conservative therapy with nasal steroid and motrin for now. Close observation. Recommend f/u with PCP if not improving, worsens, or develops any neurologic signs could consider cranial imaging or further workup.

## 2012-06-01 NOTE — Telephone Encounter (Signed)
Advised patient to come to office today at 1:30 for work in appointment.

## 2012-06-01 NOTE — Telephone Encounter (Signed)
Pt is needing to be seen today because of ear pain -

## 2012-06-01 NOTE — Telephone Encounter (Signed)
Pt is calling again.

## 2012-06-01 NOTE — Progress Notes (Signed)
  Subjective:    Patient ID: [name], female    DOB: [birth year], 67 y.o.   MRN: [id number]  Otalgia     1. Right ear pain. Patient notes that 2 weeks ago she felt pressure like an earache in her right ear. Also had a slight sore throat at that time. She tried dipping a Q-tip in olive oil trying to relieve her right ear pain initially, and this resolved after a few days. However, 2 days ago she began feeling a more sharp stabbing type sensation deep in her right ear. This radiated to her right parietal area. When it occurred, rated 10/10. States this occurs randomly, but it started while she was lying in bed last night and lasted about one hour before subsiding spontaneously. Since that time she has not felt the pain.   Review of Systems  HENT: Positive for ear pain.    She denies hearing loss, visual loss, difficulty swallowing, neck pain, temporal pain, rash, oozing from ears, fever, chills.    Objective:   Physical Exam  Vitals reviewed. Constitutional: She is oriented to person, place, and time. She appears well-developed and well-nourished. No distress.  HENT:  Head: Normocephalic and atraumatic.  Right Ear: External ear normal.  Left Ear: External ear normal.  Nose: Nose normal.  Mouth/Throat: Oropharynx is clear and moist. No oropharyngeal exudate.  No sinus TTP, neck pain, temporal pain. External canals and bilateral TM appear wnl, no lesions. No pain with movement of pinna. No pain or crepitus with TMJ ROM. No scalp lesions or vesicles noted.  Eyes: EOM are normal. Pupils are equal, round, and reactive to light.  Neck: Normal range of motion. Neck supple. No thyromegaly present.  Cardiovascular: Normal rate, regular rhythm and normal heart sounds.   Pulmonary/Chest: Effort normal and breath sounds normal. No respiratory distress. She has no wheezes.  Lymphadenopathy:    She has no cervical adenopathy.  Neurological: She is alert and oriented to person, place, and time.  No cranial nerve deficit.  Facial sensation and strength intact.  Skin: No rash noted. She is not diaphoretic.  Psychiatric: She has a normal mood and affect.        Assessment & Plan:

## 2012-06-05 ENCOUNTER — Ambulatory Visit: Payer: Medicare Other | Admitting: Family Medicine

## 2012-06-07 ENCOUNTER — Emergency Department (INDEPENDENT_AMBULATORY_CARE_PROVIDER_SITE_OTHER)
Admission: EM | Admit: 2012-06-07 | Discharge: 2012-06-07 | Disposition: A | Discharge status: Home / Self Care | Payer: Medicare Other | Source: Home / Self Care | Attending: Emergency Medicine | Admitting: Emergency Medicine

## 2012-06-07 ENCOUNTER — Encounter (HOSPITAL_COMMUNITY): Payer: Self-pay | Admitting: *Deleted

## 2012-06-07 DIAGNOSIS — S7000XA Contusion of unspecified hip, initial encounter: Secondary | ICD-10-CM

## 2012-06-07 DIAGNOSIS — T148XXA Other injury of unspecified body region, initial encounter: Secondary | ICD-10-CM

## 2012-06-07 DIAGNOSIS — IMO0002 Reserved for concepts with insufficient information to code with codable children: Secondary | ICD-10-CM

## 2012-06-07 NOTE — ED Notes (Signed)
Patient complains of right hip pain after falling yesterday at [employer] causing a laceration on left shin.

## 2012-06-07 NOTE — ED Provider Notes (Signed)
Chief Complaint  Patient presents with  . Hip Pain    History of Present Illness:   [name] is a 67 year old female who has been to the urgent care frequently. Yesterday around 1:35 PM at the target at [facility] she slipped in water, falling and landed on her buttock. She sustained a small abrasion on her left pretibial area. After the incident she felt trembly, weak, nauseated. She's had pain in both of her hip area since then but she is able ambulate well without any assistance or difficulty. She denies any numbness or tingling in the lower extremities, has had no weakness, bladder, or bowel problems.  Review of Systems:  Other than noted above, the patient denies any of the following symptoms: Systemic:  No fevers, chills, sweats, or aches.  No fatigue or tiredness. Musculoskeletal:  No joint pain, arthritis, bursitis, swelling, back pain, or neck pain. Neurological:  No muscular weakness, paresthesias, headache, or trouble with speech or coordination.  No dizziness.  PMFSH:  Past medical history, family history, social history, meds, and allergies were reviewed.  Physical Exam:   Vital signs:  BP 122/66  Pulse 100  Temp(Src) 97.7 F (36.5 C) (Oral)  Resp 18  SpO2 97%  LMP 01/09/2012 Gen:  Alert and oriented times 3.  In no distress. Musculoskeletal: She has a small abrasion on her left pretibial surface. This was not infected and there was no surrounding swelling or ecchymosis. There was no pain to palpation in the lower back or pelvis area. Both hips have full range of motion with no pain. Knees have normal range of motion with no pain..  Otherwise, all joints had a full a ROM with no swelling, bruising or deformity.  No edema, pulses full. Extremities were warm and pink.  Capillary refill was brisk.  Skin:  Clear, warm and dry.  No rash. Neuro:  Alert and oriented times 3.  Muscle strength was normal.  Sensation was intact to light touch.   Course in Urgent Care Center:   The  abrasion was cleansed with saline, antibiotic ointment was applied, and a nonstick dressing. The patient was instructed in wound care.  Assessment:  The primary encounter diagnosis was Abrasion. A diagnosis of Contusion of hip, unspecified laterality, initial encounter was also pertinent to this visit.  She does not have any apparent serious injury. My diagnosis would be a mild contusion. This should get better with time. I think all she'll need over-the-counter pain medication.  Plan:   1.  The following meds were prescribed:   Discharge Medication List as of 06/07/2012  5:20 PM     2.  The patient was instructed in symptomatic care, including rest and activity, elevation, application of ice and compression.  Appropriate handouts were given. 3.  The patient was told to return if becoming worse in any way, if no better in 3 or 4 days, and given some red flag symptoms that would indicate earlier return.   4.  The patient was told to follow up here as needed for ongoing pain.    Reuben Likes, MD 06/07/12 [phone]

## 2012-06-16 ENCOUNTER — Ambulatory Visit (INDEPENDENT_AMBULATORY_CARE_PROVIDER_SITE_OTHER): Payer: Medicare Other | Admitting: Family Medicine

## 2012-06-16 ENCOUNTER — Encounter: Payer: Self-pay | Admitting: Family Medicine

## 2012-06-16 VITALS — BP 156/95 | HR 99 | Temp 98.2°F | Wt 113.0 lb

## 2012-06-16 DIAGNOSIS — Z5189 Encounter for other specified aftercare: Secondary | ICD-10-CM | POA: Diagnosis not present

## 2012-06-16 DIAGNOSIS — S80812A Abrasion, left lower leg, initial encounter: Secondary | ICD-10-CM | POA: Insufficient documentation

## 2012-06-16 DIAGNOSIS — S80812D Abrasion, left lower leg, subsequent encounter: Secondary | ICD-10-CM

## 2012-06-16 DIAGNOSIS — J309 Allergic rhinitis, unspecified: Secondary | ICD-10-CM | POA: Diagnosis not present

## 2012-06-16 DIAGNOSIS — IMO0002 Reserved for concepts with insufficient information to code with codable children: Secondary | ICD-10-CM | POA: Diagnosis not present

## 2012-06-16 DIAGNOSIS — M549 Dorsalgia, unspecified: Secondary | ICD-10-CM | POA: Diagnosis not present

## 2012-06-16 NOTE — Assessment & Plan Note (Signed)
Advised to start Flonase prescribed by other provider

## 2012-06-16 NOTE — Progress Notes (Signed)
  Subjective:    Patient ID: [name], female    DOB: [birth year], 67 y.o.   MRN: [id number]  HPI Work in appt for sinus drainage and back pain  Sinus pressure:  Several days in duration.  Runny nose more on left than right side.   Has some nasal saline she is thinking about using.  Did not pick up flonase that was prescribed 1 month ago.  No fever, dyspnea.  Back pain: 10 days ago went to [employer] and fell.  No LOC.  Pain in back and neck.  Was seen in urgent care and treated for abrasion of left lower leg and contusion of hip.  NO numbness, tingling, change in bowel.    Not taking anythign for this   Review of Systems See HPi    Objective:   Physical Exam GEN: Alert & Oriented, No acute distress, anxious CV:  Regular Rate & Rhythm, no murmur Respiratory:  Normal work of breathing, CTAB Abd:  + BS, soft, no tenderness to palpation Ext: no pre-tibial edema MSK; no back abrasions, contusions.  Minimally tender to palpation left side thoracis Undressed left leg abrasion she has extensively wrapped and had concerned about swelling.  Nos welling noted, small scan, well healed underneath        Assessment & Plan:

## 2012-06-16 NOTE — Assessment & Plan Note (Signed)
Resolving strain after fall, advised supportive care

## 2012-06-16 NOTE — Assessment & Plan Note (Signed)
Well healed almost completely resolved.  Advised supportive care.

## 2012-06-16 NOTE — Patient Instructions (Addendum)
Muscle spasm- use warm baths to help with pain  Your leg looks very good- healing well

## 2012-06-19 ENCOUNTER — Ambulatory Visit: Payer: Medicare Other | Admitting: Family Medicine

## 2012-06-22 ENCOUNTER — Ambulatory Visit (INDEPENDENT_AMBULATORY_CARE_PROVIDER_SITE_OTHER): Payer: Medicare Other | Admitting: Family Medicine

## 2012-06-22 ENCOUNTER — Encounter: Payer: Self-pay | Admitting: Family Medicine

## 2012-06-22 VITALS — BP 148/87 | HR 99 | Temp 97.7°F | Ht <= 58 in | Wt 111.0 lb

## 2012-06-22 DIAGNOSIS — J309 Allergic rhinitis, unspecified: Secondary | ICD-10-CM | POA: Diagnosis not present

## 2012-06-22 DIAGNOSIS — M5380 Other specified dorsopathies, site unspecified: Secondary | ICD-10-CM | POA: Diagnosis not present

## 2012-06-22 DIAGNOSIS — Z5189 Encounter for other specified aftercare: Secondary | ICD-10-CM

## 2012-06-22 DIAGNOSIS — IMO0002 Reserved for concepts with insufficient information to code with codable children: Secondary | ICD-10-CM | POA: Diagnosis not present

## 2012-06-22 DIAGNOSIS — J3489 Other specified disorders of nose and nasal sinuses: Secondary | ICD-10-CM | POA: Diagnosis not present

## 2012-06-22 DIAGNOSIS — M549 Dorsalgia, unspecified: Secondary | ICD-10-CM

## 2012-06-22 DIAGNOSIS — S80812D Abrasion, left lower leg, subsequent encounter: Secondary | ICD-10-CM

## 2012-06-22 DIAGNOSIS — R0981 Nasal congestion: Secondary | ICD-10-CM | POA: Insufficient documentation

## 2012-06-22 DIAGNOSIS — M62830 Muscle spasm of back: Secondary | ICD-10-CM

## 2012-06-22 MED ORDER — BACLOFEN 5 MG HALF TABLET: 5.0000 mg | ORAL_TABLET | Freq: Three times a day (TID) | ORAL | Status: DC

## 2012-06-22 MED ORDER — GUAIFENESIN ER 600 MG PO TB12: 1200.0000 mg | ORAL_TABLET | Freq: Two times a day (BID) | ORAL | Status: DC

## 2012-06-22 NOTE — Progress Notes (Signed)
Subjective:    Patient ID: [name], female    DOB: 06/19/1945, 67 y.o.   MRN: [id number]  HPI: Pt presents to clinic for complaints of continued back pain and neck pain after a recent fall. Pt fell at [employer] in a large puddle of water on 3/4. Pt was evaluated at urgent care but no images were taken and pt was not prescribed any medication other than OTC analgesics; she was told she had "muscle sprains and hip contusions." Pt was seen on 3/14 by Dr. Earnest Bailey for similar complaints and advised continued supportive care. Pt complains today of continued muscle spasms in her low back brought on by movement (worst with twisting and bending), as well as continued "soreness" in her "mid-back" (thoracic region). Pt has been taking ASA for pain and using Bengay for soreness with some small amount relief. She is also concerned that her leg is not healing; pt had an abrasion of her tibia and now has a "knot" in the area that is not tender but that is "new in the past few days, after the skin looks like it healed."  Pt also complains of sinus congestion for "several days" which has not been addressed by previous providers. Pt describes sinus pressure/pain without rhinorrhea or itchy/runny eyes. Occasional cough but no production. Pt repeatedly requested Z-Pak by name and insisted on evaluation/management of complaint despite being subjectively more concerned with back issues, as above.  Of note, pt was very circumstantial and tangential during the interview and required constant redirection; see exam and A&P, below.  Review of Systems: As above. Additionally, pt describes some weakness in her legs with burning/soreness in her posterior calves (pt repeatedly specified "just the backs of my calves") when walking a lot or walking up-hill; this is not new or worse/changed since her fall. Denies N/V, chest pain, SOB, subjective fevers "occasionally with congestion." Pt also  describes her stools have been "lighter in  color" and she complains of "lots of gas" when she eats food in the middle of the night to try help her sleep; pt states she does not think this is related to her back.     Objective:   Physical Exam BP 148/87  Pulse 99  Temp(Src) 97.7 F (36.5 C) (Oral)  Ht 4' 9.25" (1.454 m)  Wt 111 lb (50.349 kg)  BMI 23.82 kg/m2  LMP 01/09/2012 Gen: adult elderly female in NAD but constantly requiring redirection and reassurance Psych: slightly anxious affect but appropriate in conversation and cooperative  Pt very circumstantial and tangential in conversation but redirectable with effort  thought content generally normal; not acutely psychotic or delusional HEENT: TMs clear bilaterally, MMM, nasal mucosa slightly boggy and mildly inflamed  No nasal discharge or rhinorrhea, conjunctivae clear bilaterally, posterior oropharynx clear  Neck supple with full range of motion; some cervical muscle pain with full flexion/extension Cardio: RRR, no murmur appreciated Pulm: CTAB, no wheezes, normal WOB MSK: mild-moderate point tenderness to palpation in midline thoracic spine around T10-T12  Mild-moderate slightly more diffuse lumbosacral tenderness to palpation as well  Paraspinal musculature tense but no true evoked muscle spasm with active/passive ROM  Pain exacerbated with assisted lateral bending/twisting  Sitting straight-leg lift without radicular symptoms elicited  No step-offs or obvious malalignment of spine  Moderate SI joint tenderness but no pelvic instability noted Neuro: patellar and Achilles tendon DTR's 2+ and symmetric bilaterally  Strength 4+/5 and equal bilaterally in LE, gait normal/slightly antalgic  Balance intact; LE sensation intact bilaterally Skin:  left anterior tibia with ~1cm circular eschar without surrounding erythema; no bleeding or drainage  Slight induration-like fullness lateral and slightly superior to eschar, nontender  Skin around eschar slightly yellowish/brownish  consistent with resolving bruise Ext: left lower leg, as above; otherwise warm/well-perfused, distal pulses intact/symmetric     Assessment & Plan:  Greater than 40 minutes spent with pt, approximately 25 of which were devoted to counseling/reassurance and explanations of strategies to help mitigate back pain; pt required almost continual redirection and repetition of plans/instructions. Pt discussed with and examined in part in conjunction with Dr. Sheffield Slider.  Back pain and muscle spasm s/p fall on 3/4. Tibial skin abrasion, healing well. Sinus congestion. See problem list notes.

## 2012-06-22 NOTE — Patient Instructions (Addendum)
Thank you for coming in, today. It was nice to meet you. I'm sorry you're still having trouble with your back.    I want you to take ibuprofen 600 mg every day, three times a day, for the next week.    I will prescribe a medication that will help with muscle spasms, called baclofen. It may make you sleepy. You should take it 3 times a day.    I also want you to get some xrays done. You can go at any time. I will call you or send you a letter with the results.    Your xrays might explain some of your leg symptoms, as well.    Make an appointment to come back to see Dr. Fara Boros after you get your xrays done.    I will refer you to physical therapy, as well. The physical therapy office will contact you to set up an appointment.    Warm compresses with a hot wash cloth can help for the "knot" on your leg. It will get better with time. For your sinuses:    I will prescribe you a medication called Mucinex. It will help with congestion.    If you are still having trouble in the next week, call to make an appointment specifically for your sinuses. Please call the clinic at any time, if you have questions or concerns. --Dr. Casper Harrison  EDIT: After review of the above with pt, she declined Mucinex; discussed dextromethorphan+phenylephrine over the counter instead. Pt also initially declined ibuprofen but stated she would think about trying it, or take ASA instead

## 2012-06-22 NOTE — Assessment & Plan Note (Signed)
Complaint not main point of today's visit, but pt declined returning to clinic for appointment to specifically address it and insisted on assessment/management today. Exam and history not concerning for bacterial sinusitis. Most likely mild viral process. Initially prescribed guaifenesin but pt refused stating intolerance. Discussed OTC medications such as phenylephrine for decongestant effect. Pt instructed to f/u for a visit specifically for sinusitis if necessary.

## 2012-06-22 NOTE — Assessment & Plan Note (Signed)
A: Secondary to muscle sprain from fall on 3/4. Exacerbated with bending/twisting.  P: Discussed with Dr. Sheffield Slider. Will avoid Flexeril in elderly patient; Rx provided for baclofen, 5 mg TID for 10 days (10 mg tabs #15). Also discussed heating pad/hot water bottle therapy, etc. See also back pain problem list note.

## 2012-06-22 NOTE — Assessment & Plan Note (Signed)
A: Persistent after fall at [employer] after slipping in water on 3/4; pt has been evaluated at urgent care and in our clinic after that. No imaging performed as of yet. No red flags such as new neuro deficit, loss of bowel control, etc. Pt does describe some weakness and burning pain of calves with exertion, not new or different, but possibly claudication exacerbated by acute back sprain. Exam otherwise relatively benign, with some point tenderness of low-thoracic spine and lumbosacral spine. Possibility exists for compression or other fracture(s) but also strongly suspect degenerative/arthritic changes likely exacerbated by fall.  P: Discussed with Dr. Sheffield Slider. Greater than 40 minutes spent with pt, approximately 25 of which were devoted to counseling/reassurance and explanations of strategies to help mitigate back pain; pt required almost continual redirection and repetition of plans/instructions. Pt instructed to report to imaging center for spinal plain films, and pt was referred to physical therapy per her request. Also instructed pt to take ibuprofen 600 mg TID daily for the next several days, but pt declined and stated she would take ASA instead. I will follow up with pt about x-ray findings and communicate with her PCP, Dr. Fara Boros. Otherwise, pt is to follow up with PCP if complaints continue or do not improve. See also muscle sprain problem list note.

## 2012-06-22 NOTE — Assessment & Plan Note (Signed)
Healing well with small eschar to anterior calf, with surrounding resolving bruise. Nontender with some surrounding induration-like swelling which likely reflects resolving subperiosteal hematoma; no concern for fracture, and skin abrasion does not appear infected. Continue supportive care, local hygiene, etc. Pt reassured on natural course of bruising.

## 2012-06-23 ENCOUNTER — Telehealth: Payer: Self-pay | Admitting: Family Medicine

## 2012-06-23 ENCOUNTER — Ambulatory Visit
Admission: RE | Admit: 2012-06-23 | Discharge: 2012-06-23 | Disposition: A | Discharge status: Home / Self Care | Payer: Medicare Other | Source: Ambulatory Visit | Attending: Family Medicine | Admitting: Family Medicine

## 2012-06-23 ENCOUNTER — Other Ambulatory Visit: Payer: Self-pay | Admitting: Family Medicine

## 2012-06-23 ENCOUNTER — Ambulatory Visit: Admission: RE | Admit: 2012-06-23 | Payer: Medicare Other | Source: Ambulatory Visit

## 2012-06-23 DIAGNOSIS — M545 Low back pain, unspecified: Secondary | ICD-10-CM | POA: Diagnosis not present

## 2012-06-23 DIAGNOSIS — IMO0002 Reserved for concepts with insufficient information to code with codable children: Secondary | ICD-10-CM | POA: Diagnosis not present

## 2012-06-23 DIAGNOSIS — M549 Dorsalgia, unspecified: Secondary | ICD-10-CM

## 2012-06-23 DIAGNOSIS — M62830 Muscle spasm of back: Secondary | ICD-10-CM

## 2012-06-23 NOTE — Telephone Encounter (Signed)
Called pt. Informed of Dr.Street's message. Pt verbalized understanding and agreed. Lorenda Hatchet, Renato Battles

## 2012-06-23 NOTE — Telephone Encounter (Signed)
Please call pt to let her know that her xrays did not show any fractures. She does have some arthritis, which we expected to see. She should continue supportive care like I discussed with her on 3/20. She should also be getting a call from physical therapy to schedule treatment with them. She has an appointment scheduled next week with Dr. Fara Boros. --CMS

## 2012-06-26 ENCOUNTER — Encounter: Payer: Self-pay | Admitting: Family Medicine

## 2012-06-26 ENCOUNTER — Ambulatory Visit (INDEPENDENT_AMBULATORY_CARE_PROVIDER_SITE_OTHER): Payer: Medicare Other | Admitting: Family Medicine

## 2012-06-26 VITALS — BP 149/84 | HR 90 | Temp 97.6°F | Ht <= 58 in | Wt 111.0 lb

## 2012-06-26 DIAGNOSIS — I10 Essential (primary) hypertension: Secondary | ICD-10-CM

## 2012-06-26 DIAGNOSIS — J019 Acute sinusitis, unspecified: Secondary | ICD-10-CM

## 2012-06-26 DIAGNOSIS — E039 Hypothyroidism, unspecified: Secondary | ICD-10-CM | POA: Diagnosis not present

## 2012-06-26 DIAGNOSIS — E785 Hyperlipidemia, unspecified: Secondary | ICD-10-CM

## 2012-06-26 MED ORDER — DOXYCYCLINE HYCLATE 100 MG PO TABS: 100.0000 mg | ORAL_TABLET | Freq: Two times a day (BID) | ORAL | Status: DC

## 2012-06-26 NOTE — Patient Instructions (Addendum)
It was good to see you today.  You are not due to have your cholesterol checked until 07/07/12.  You can come back FASTING to have this lab done (nothing to eat for drink after midnight).  I am giving you an antibiotic for your sinus infection-- you should start feeling better in a few days.  Your xrays do not show anything broken-- the physical therapy people will call and set something up to help with your back pain.  Come back a few weeks after you have your labs drawn and we can discuss them.

## 2012-06-26 NOTE — Assessment & Plan Note (Signed)
Since allergiv to amox, will treat with doxy since 3 weeks in duration and + sinus TTP.  Continue flonase and mucinex.

## 2012-06-26 NOTE — Assessment & Plan Note (Signed)
Due for FLP 07/2012; return for this. Unclear if pt taking any medication.

## 2012-06-26 NOTE — Assessment & Plan Note (Signed)
U/S 12/2011 without any changes; no need to consider repeat until 12/2012 at the earliest.  Will recheck TSH with other labs when she returns for lab appt. Pt reports daily compliance with medicine.

## 2012-06-26 NOTE — Progress Notes (Signed)
S: Pt comes in today for follow up.  Was seen 06/22/12 for fall but also mentioned sinus issues.  Today she is requesting a refill of her claritin.  Has been having congestion, runny nose, and sinus pressure for 3 weeks.  No fevers. No cough/sputum production. No SOB.  Thinks it may be related to ppl in her apartment complex smoking.  Pressure is in the middle of her forehead, between her eyes.  Back pain: would like to discuss xrays; still hurting, PT has not called to schedule yet; has not picked up baclofen yet  Thyroid: would like this to be checked; last U/S 12/2011 and showed: Small right mid/lower thyroid nodules measuring up to 7 mm, unchanged. 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 Former Smoker -- 0.10 packs/day for 35 years  . Types: Cigarettes  . Quit date: 05/25/2012  Smokeless tobacco  . Never Used    Comment: smokes less than 1 pack per week. ready to quitt.    O:  Filed Vitals:   06/26/12 1356  BP: 149/84  Pulse: 90  Temp: 97.6 F (36.4 C)    Gen: NAD HEENT: MMM, EOMI, no pharyngeal erythema or exudate, nasal mucosa normal, mild sinus TTP over frontal sinuses, TMs normal bilaterally  CV: RRR, no murmur Pulm: CTA bilat, no wheezes or crackles   A/P: 67 y.o. female p/w sinusitis, HLD, hypothyroidism, MSK back pain -See problem list -f/u in 1-2 months after labs drawn

## 2012-06-26 NOTE — Assessment & Plan Note (Signed)
Slightly elevated today, will check CMET when pt returns for FLP.  Cont lisinopril 5mg .   BP Readings from Last 3 Encounters:  06/26/12 149/84  06/22/12 148/87  06/16/12 156/95

## 2012-06-27 ENCOUNTER — Telehealth: Payer: Self-pay | Admitting: Family Medicine

## 2012-06-27 ENCOUNTER — Ambulatory Visit: Payer: Medicare Other | Admitting: Family Medicine

## 2012-06-27 ENCOUNTER — Other Ambulatory Visit: Payer: Self-pay | Admitting: Physician Assistant

## 2012-06-27 DIAGNOSIS — D485 Neoplasm of uncertain behavior of skin: Secondary | ICD-10-CM | POA: Diagnosis not present

## 2012-06-27 DIAGNOSIS — L820 Inflamed seborrheic keratosis: Secondary | ICD-10-CM | POA: Diagnosis not present

## 2012-06-27 DIAGNOSIS — L723 Sebaceous cyst: Secondary | ICD-10-CM | POA: Diagnosis not present

## 2012-06-27 MED ORDER — ZOSTER VACCINE LIVE 19400 UNT/0.65ML ~~LOC~~ SOLR: 0.6500 mL | Freq: Once | SUBCUTANEOUS | Status: DC

## 2012-06-27 MED ORDER — AZITHROMYCIN 250 MG PO TABS: ORAL_TABLET | ORAL | Status: DC

## 2012-06-27 NOTE — Telephone Encounter (Signed)
Called pt back. 1.) she request to change Doxy, because it made her sick and she can not keep it in her stomach.  2.) explained process of shingles vaccination to her. Dr.McGill will write Rx for the shingles vacc and I will put the Rx and info up front for her. Pt verbalized understanding and I will call her back, once Dr.McGill addressed this. Lorenda Hatchet, Renato Battles

## 2012-06-27 NOTE — Telephone Encounter (Signed)
Patient is calling because the Doxycycline makes her nauseated and vomit.  She wants to know if there is something else she can take instead.  Also, she wanted to find out if Dr. Fara Boros found out about where she can get the Shingles shot with Medicare.  She would like someone to call her when this is done.

## 2012-06-27 NOTE — Telephone Encounter (Signed)
Called pt to pick up rx at pharmacy and shingles rx at our office. Pt agreed. Lorenda Hatchet, Renato Battles

## 2012-06-28 ENCOUNTER — Encounter: Payer: Self-pay | Admitting: Home Health Services

## 2012-06-28 DIAGNOSIS — Z9181 History of falling: Secondary | ICD-10-CM | POA: Insufficient documentation

## 2012-07-01 ENCOUNTER — Telehealth: Payer: Self-pay | Admitting: Family Medicine

## 2012-07-01 NOTE — Telephone Encounter (Signed)
[facility] after hours line  Patient wanted to extend her antibiotics even though she is starting to feel better. Told patient she took the full course of antibiotics. She should follow up next week if not continuing to improve.

## 2012-07-04 ENCOUNTER — Ambulatory Visit: Payer: Medicare Other | Admitting: Physical Therapy

## 2012-07-06 ENCOUNTER — Ambulatory Visit: Payer: Medicare Other | Admitting: Physical Therapy

## 2012-07-06 ENCOUNTER — Ambulatory Visit: Payer: Medicare Other | Admitting: Family Medicine

## 2012-07-10 ENCOUNTER — Ambulatory Visit: Payer: Medicare Other | Attending: Family Medicine

## 2012-07-10 DIAGNOSIS — R5381 Other malaise: Secondary | ICD-10-CM | POA: Insufficient documentation

## 2012-07-10 DIAGNOSIS — R293 Abnormal posture: Secondary | ICD-10-CM | POA: Diagnosis not present

## 2012-07-10 DIAGNOSIS — M545 Low back pain, unspecified: Secondary | ICD-10-CM | POA: Diagnosis not present

## 2012-07-10 DIAGNOSIS — IMO0001 Reserved for inherently not codable concepts without codable children: Secondary | ICD-10-CM | POA: Diagnosis not present

## 2012-07-12 DIAGNOSIS — M79609 Pain in unspecified limb: Secondary | ICD-10-CM | POA: Diagnosis not present

## 2012-07-12 DIAGNOSIS — D2370 Other benign neoplasm of skin of unspecified lower limb, including hip: Secondary | ICD-10-CM | POA: Diagnosis not present

## 2012-07-12 DIAGNOSIS — M19079 Primary osteoarthritis, unspecified ankle and foot: Secondary | ICD-10-CM | POA: Diagnosis not present

## 2012-07-13 ENCOUNTER — Ambulatory Visit (INDEPENDENT_AMBULATORY_CARE_PROVIDER_SITE_OTHER): Payer: Medicare Other | Admitting: Family Medicine

## 2012-07-13 ENCOUNTER — Encounter: Payer: Self-pay | Admitting: Family Medicine

## 2012-07-13 VITALS — BP 114/74 | HR 101 | Ht <= 58 in | Wt 111.2 lb

## 2012-07-13 DIAGNOSIS — F319 Bipolar disorder, unspecified: Secondary | ICD-10-CM

## 2012-07-13 DIAGNOSIS — J309 Allergic rhinitis, unspecified: Secondary | ICD-10-CM

## 2012-07-13 DIAGNOSIS — M79669 Pain in unspecified lower leg: Secondary | ICD-10-CM | POA: Insufficient documentation

## 2012-07-13 DIAGNOSIS — M79609 Pain in unspecified limb: Secondary | ICD-10-CM

## 2012-07-13 DIAGNOSIS — J019 Acute sinusitis, unspecified: Secondary | ICD-10-CM | POA: Diagnosis not present

## 2012-07-13 MED ORDER — LORATADINE 10 MG PO TABS: 10.0000 mg | ORAL_TABLET | Freq: Every day | ORAL | Status: DC

## 2012-07-13 MED ORDER — FLUTICASONE PROPIONATE 50 MCG/ACT NA SUSP: 2.0000 | Freq: Every day | NASAL | Status: DC

## 2012-07-13 NOTE — Progress Notes (Signed)
S: Pt comes in today for follow up. She was supposed to follow up after having labs drawn, but she has not had these done yet.  Today, we discussed:  SINUSITIS Seen on 3/20 and Rx'ed Doxy for acute sinusitis due to 3 week history of congestion, runny nose, and sinus pressure; denied fevers/chills, cough, and SOB at that time.  She called and requested an extension of her antibiotic on 3/29, which was denied. She was told to continue Flonase and Mucinex.  Not currently doing flonase or allergy medicine.  Now, ear is popping and feels full.  Sensitive to loud noises.  Still having congestion.  Glands in neck are swollen.  + ST, no cough.  Sinus pressure is better.  No fevers.  Feels a little "loopy" or off balance at times.  No more falls.    CLAUDICATION/LEG PAIN Weakness in her calves every time she walks long distances.  Also will have pain in her calves- sometimes goes to thigh/buttock.  Pain just hurts, can't be more specific.  Would really really like this evaluated.  Has been having symptoms for over 1 year.  Brought in Internet hand out with symptoms of PVD and she is having all of them, and is pretty sure she has this disease.    ROS: Per HPI  History  Smoking status  . Former Smoker -- 0.10 packs/day for 35 years  . Types: Cigarettes  . Quit date: 05/25/2012  Smokeless tobacco  . Never Used    Comment: smokes less than 1 pack per week. ready to quitt.    O:  Filed Vitals:   07/13/12 1503  BP: 114/74  Pulse: 101    Gen: NAD HEENT: MMM, no pharyngeal erythema or exudate, potentially shotty cervical LAD, nasal mucosa normal, TMs normal bilaterally  CV: RRR, no murmur; no carotid bruits  Pulm: CTA bilat, no wheezes or crackles Ext: Warm, no chronic skin changes, no edema; intact 2+ pedal pulses bilaterally; hair on shins bilaterally    A/P: 67 y.o. female p/w sinusitis- likely 2/2 allergies at this point, ?PVD -See problem list -f/u in PRN 1-2 months

## 2012-07-13 NOTE — Patient Instructions (Addendum)
It was good to see you today.    For your legs, schedule an appointment with the PHARMACY clinic for ABIs.  For your congestion, I sent in some allergy medicine as well as a nose spray.  The nose spray will help the most, since I think this is allergies.  You should see [facility] again for your Lexapro, since they are the ones who prescribed this.  Come back to see me 3-4 weeks after you have your labs drawn and we can go over them.

## 2012-07-13 NOTE — Assessment & Plan Note (Signed)
Acute sinusitis has resolved.  Rx'ed claritin and flonase.  Strongly encouraged pt to use nasal spray, even though she reports she does not use them.  Discussed that this will be the most beneficial medication if her congestion is coming from allergies, otherwise it is viral URI.

## 2012-07-13 NOTE — Assessment & Plan Note (Signed)
Pt is convinced she has PVD.  Will send to pharm clinic for ABIs, although with her good pulses and normal exam, I would be surprised if they are significantly abnormal.  Pt already taking daily ASA, would not add any other medication at this time.

## 2012-07-13 NOTE — Assessment & Plan Note (Signed)
Pt asking for me to refill Effexor-- was being Rx'ed by [facility].  Advised pt she should return there for refill.  She has appt next month.

## 2012-07-14 ENCOUNTER — Other Ambulatory Visit: Payer: Medicare Other

## 2012-07-14 DIAGNOSIS — E039 Hypothyroidism, unspecified: Secondary | ICD-10-CM

## 2012-07-14 DIAGNOSIS — I10 Essential (primary) hypertension: Secondary | ICD-10-CM

## 2012-07-14 DIAGNOSIS — E785 Hyperlipidemia, unspecified: Secondary | ICD-10-CM

## 2012-07-14 NOTE — Progress Notes (Signed)
CMP,FLP,TSH AND CBC DONE TODAY [name] 

## 2012-07-15 LAB — LIPID PANEL
Cholesterol: 215 mg/dL — ABNORMAL HIGH (ref 0–200)
HDL: 58 mg/dL (ref >39)
LDL Cholesterol: 142 mg/dL — ABNORMAL HIGH (ref 0–99)
Total CHOL/HDL Ratio: 3.7 Ratio
Triglycerides: 74 mg/dL (ref <150)
VLDL: 15 mg/dL (ref 0–40)

## 2012-07-15 LAB — CBC
HCT: 39.9 % (ref 36.0–46.0)
Hemoglobin: 13.4 g/dL (ref 12.0–15.0)
MCH: 31.8 pg (ref 26.0–34.0)
MCHC: 33.6 g/dL (ref 30.0–36.0)
MCV: 94.5 fL (ref 78.0–100.0)
Platelets: 328 10*3/uL (ref 150–400)
RBC: 4.22 MIL/uL (ref 3.87–5.11)
RDW: 13.8 % (ref 11.5–15.5)
WBC: 4.4 10*3/uL (ref 4.0–10.5)

## 2012-07-15 LAB — COMPREHENSIVE METABOLIC PANEL
ALT: 9 U/L (ref 0–35)
AST: 15 U/L (ref 0–37)
Albumin: 4.2 g/dL (ref 3.5–5.2)
Alkaline Phosphatase: 86 U/L (ref 39–117)
BUN: 11 mg/dL (ref 6–23)
CO2: 25 mEq/L (ref 19–32)
Calcium: 9.2 mg/dL (ref 8.4–10.5)
Chloride: 105 mEq/L (ref 96–112)
Creat: 0.77 mg/dL (ref 0.50–1.10)
Glucose, Bld: 93 mg/dL (ref 70–99)
Potassium: 4.2 mEq/L (ref 3.5–5.3)
Sodium: 140 mEq/L (ref 135–145)
Total Bilirubin: 0.5 mg/dL (ref 0.3–1.2)
Total Protein: 6.2 g/dL (ref 6.0–8.3)

## 2012-07-15 LAB — TSH: TSH: 0.047 u[IU]/mL — ABNORMAL LOW (ref 0.350–4.500)

## 2012-07-17 ENCOUNTER — Ambulatory Visit: Payer: Medicare Other

## 2012-07-17 ENCOUNTER — Ambulatory Visit: Payer: Medicare Other | Admitting: Physical Therapy

## 2012-07-18 ENCOUNTER — Other Ambulatory Visit: Payer: Self-pay | Admitting: Family Medicine

## 2012-07-18 ENCOUNTER — Ambulatory Visit: Payer: Medicare Other | Admitting: Family Medicine

## 2012-07-19 ENCOUNTER — Encounter: Payer: Self-pay | Admitting: Family Medicine

## 2012-07-19 ENCOUNTER — Other Ambulatory Visit: Payer: Self-pay | Admitting: Family Medicine

## 2012-07-20 ENCOUNTER — Ambulatory Visit: Payer: Medicare Other | Admitting: Physical Therapy

## 2012-07-20 DIAGNOSIS — R293 Abnormal posture: Secondary | ICD-10-CM | POA: Diagnosis not present

## 2012-07-20 DIAGNOSIS — R5381 Other malaise: Secondary | ICD-10-CM | POA: Diagnosis not present

## 2012-07-20 DIAGNOSIS — IMO0001 Reserved for inherently not codable concepts without codable children: Secondary | ICD-10-CM | POA: Diagnosis not present

## 2012-07-20 DIAGNOSIS — M545 Low back pain, unspecified: Secondary | ICD-10-CM | POA: Diagnosis not present

## 2012-07-24 ENCOUNTER — Ambulatory Visit: Payer: Medicare Other | Admitting: Physical Therapy

## 2012-07-24 ENCOUNTER — Encounter: Payer: Self-pay | Admitting: Physical Therapy

## 2012-07-27 ENCOUNTER — Ambulatory Visit (INDEPENDENT_AMBULATORY_CARE_PROVIDER_SITE_OTHER): Payer: Medicare Other | Admitting: Family Medicine

## 2012-07-27 ENCOUNTER — Ambulatory Visit: Payer: Medicare Other | Admitting: Physical Therapy

## 2012-07-27 ENCOUNTER — Encounter: Payer: Self-pay | Admitting: Family Medicine

## 2012-07-27 VITALS — BP 117/74 | HR 95 | Ht <= 58 in | Wt 109.1 lb

## 2012-07-27 DIAGNOSIS — R5381 Other malaise: Secondary | ICD-10-CM | POA: Diagnosis not present

## 2012-07-27 DIAGNOSIS — M545 Low back pain, unspecified: Secondary | ICD-10-CM | POA: Diagnosis not present

## 2012-07-27 DIAGNOSIS — IMO0001 Reserved for inherently not codable concepts without codable children: Secondary | ICD-10-CM | POA: Diagnosis not present

## 2012-07-27 DIAGNOSIS — E039 Hypothyroidism, unspecified: Secondary | ICD-10-CM | POA: Diagnosis not present

## 2012-07-27 DIAGNOSIS — R293 Abnormal posture: Secondary | ICD-10-CM | POA: Diagnosis not present

## 2012-07-27 MED ORDER — LEVOTHYROXINE SODIUM 75 MCG PO TABS: 75.0000 ug | ORAL_TABLET | Freq: Every day | ORAL | Status: DC

## 2012-07-27 NOTE — Progress Notes (Signed)
S: Pt comes in today for SDA to discuss thyroid.  She came to clinic today very concerned about her thyroid.  She received a letter in which I stated "We may be over treating your thyroid; we should discuss how much medicine you are taking and if we should decrease it slightly." because of her TSH of 0.047.  She is currently taking levothyroxine .  She reports that she had been taking 1 pill once per day and was not missing any doses.  She has a history of elevated TSH due to poor med compliance, but her TSH is now suppressed.  Last thyroid U/S (for nodules) was 12/2011 and unchanged from previous.     ROS: Per HPI  History  Smoking status  . Former Smoker -- 0.10 packs/day for 35 years  . Types: Cigarettes  . Quit date: 05/25/2012  Smokeless tobacco  . Never Used    Comment: smokes less than 1 pack per week. ready to quitt.    O:  Filed Vitals:   07/27/12 1603  BP: 117/74  Pulse: 95    Gen: NAD Thyroid: mildly enlarged on R with small, pea-sized nodule appreciated    A/P: 67 y.o. female p/w hypothyroidism, overcorrected on medicatio -See problem list -f/u in PRN

## 2012-07-27 NOTE — Assessment & Plan Note (Signed)
TSH suppressed, will decrease levothyroxine from to . Recheck in 2 months. Do not need repeat thyroid U/S until 12/2012 at the earliest.

## 2012-07-27 NOTE — Patient Instructions (Addendum)
We are changing the dose of your thyroid medicine. Pick up the new dose. We will recheck your thyroid hormone level in about 2 months.

## 2012-07-30 ENCOUNTER — Other Ambulatory Visit: Payer: Self-pay | Admitting: Family Medicine

## 2012-08-01 ENCOUNTER — Ambulatory Visit: Payer: Medicare Other | Admitting: Physical Therapy

## 2012-08-01 ENCOUNTER — Ambulatory Visit: Payer: Medicare Other | Admitting: Pharmacist

## 2012-08-02 ENCOUNTER — Ambulatory Visit: Payer: Medicare Other | Admitting: Family Medicine

## 2012-08-09 ENCOUNTER — Ambulatory Visit: Payer: Medicare Other | Admitting: Physical Therapy

## 2012-08-11 DIAGNOSIS — F429 Obsessive-compulsive disorder, unspecified: Secondary | ICD-10-CM | POA: Diagnosis not present

## 2012-08-11 DIAGNOSIS — IMO0002 Reserved for concepts with insufficient information to code with codable children: Secondary | ICD-10-CM | POA: Diagnosis not present

## 2012-08-16 ENCOUNTER — Ambulatory Visit: Payer: Medicare Other | Attending: Family Medicine | Admitting: Physical Therapy

## 2012-08-16 ENCOUNTER — Encounter: Payer: Self-pay | Admitting: Home Health Services

## 2012-08-16 DIAGNOSIS — IMO0001 Reserved for inherently not codable concepts without codable children: Secondary | ICD-10-CM | POA: Diagnosis not present

## 2012-08-16 DIAGNOSIS — M545 Low back pain, unspecified: Secondary | ICD-10-CM | POA: Diagnosis not present

## 2012-08-16 DIAGNOSIS — R5381 Other malaise: Secondary | ICD-10-CM | POA: Insufficient documentation

## 2012-08-16 DIAGNOSIS — R293 Abnormal posture: Secondary | ICD-10-CM | POA: Insufficient documentation

## 2012-08-20 ENCOUNTER — Encounter (HOSPITAL_COMMUNITY): Payer: Self-pay | Admitting: Family Medicine

## 2012-08-20 ENCOUNTER — Emergency Department (HOSPITAL_COMMUNITY)
Admission: EM | Admit: 2012-08-20 | Discharge: 2012-08-20 | Disposition: A | Discharge status: Home / Self Care | Payer: Medicare Other | Attending: Emergency Medicine | Admitting: Emergency Medicine

## 2012-08-20 DIAGNOSIS — F411 Generalized anxiety disorder: Secondary | ICD-10-CM | POA: Diagnosis not present

## 2012-08-20 DIAGNOSIS — M79609 Pain in unspecified limb: Secondary | ICD-10-CM | POA: Insufficient documentation

## 2012-08-20 DIAGNOSIS — F429 Obsessive-compulsive disorder, unspecified: Secondary | ICD-10-CM | POA: Diagnosis not present

## 2012-08-20 DIAGNOSIS — M549 Dorsalgia, unspecified: Secondary | ICD-10-CM | POA: Insufficient documentation

## 2012-08-20 DIAGNOSIS — R011 Cardiac murmur, unspecified: Secondary | ICD-10-CM | POA: Diagnosis not present

## 2012-08-20 DIAGNOSIS — Z880 Allergy status to penicillin: Secondary | ICD-10-CM | POA: Insufficient documentation

## 2012-08-20 DIAGNOSIS — Z8742 Personal history of other diseases of the female genital tract: Secondary | ICD-10-CM | POA: Diagnosis not present

## 2012-08-20 DIAGNOSIS — R209 Unspecified disturbances of skin sensation: Secondary | ICD-10-CM | POA: Diagnosis not present

## 2012-08-20 DIAGNOSIS — Z8719 Personal history of other diseases of the digestive system: Secondary | ICD-10-CM | POA: Diagnosis not present

## 2012-08-20 DIAGNOSIS — Z8711 Personal history of peptic ulcer disease: Secondary | ICD-10-CM | POA: Insufficient documentation

## 2012-08-20 DIAGNOSIS — J4489 Other specified chronic obstructive pulmonary disease: Secondary | ICD-10-CM | POA: Insufficient documentation

## 2012-08-20 DIAGNOSIS — R5381 Other malaise: Secondary | ICD-10-CM | POA: Insufficient documentation

## 2012-08-20 DIAGNOSIS — R5383 Other fatigue: Secondary | ICD-10-CM | POA: Insufficient documentation

## 2012-08-20 DIAGNOSIS — R0789 Other chest pain: Secondary | ICD-10-CM | POA: Diagnosis not present

## 2012-08-20 DIAGNOSIS — Z79899 Other long term (current) drug therapy: Secondary | ICD-10-CM | POA: Diagnosis not present

## 2012-08-20 DIAGNOSIS — J449 Chronic obstructive pulmonary disease, unspecified: Secondary | ICD-10-CM | POA: Diagnosis not present

## 2012-08-20 DIAGNOSIS — E079 Disorder of thyroid, unspecified: Secondary | ICD-10-CM | POA: Diagnosis not present

## 2012-08-20 DIAGNOSIS — R079 Chest pain, unspecified: Secondary | ICD-10-CM | POA: Diagnosis not present

## 2012-08-20 DIAGNOSIS — M542 Cervicalgia: Secondary | ICD-10-CM | POA: Insufficient documentation

## 2012-08-20 DIAGNOSIS — M810 Age-related osteoporosis without current pathological fracture: Secondary | ICD-10-CM | POA: Insufficient documentation

## 2012-08-20 DIAGNOSIS — G8929 Other chronic pain: Secondary | ICD-10-CM

## 2012-08-20 DIAGNOSIS — M79602 Pain in left arm: Secondary | ICD-10-CM

## 2012-08-20 DIAGNOSIS — Z87891 Personal history of nicotine dependence: Secondary | ICD-10-CM | POA: Diagnosis not present

## 2012-08-20 DIAGNOSIS — Z7982 Long term (current) use of aspirin: Secondary | ICD-10-CM | POA: Insufficient documentation

## 2012-08-20 LAB — POCT I-STAT, CHEM 8
BUN: 18 mg/dL (ref 6–23)
Calcium, Ion: 1.24 mmol/L (ref 1.13–1.30)
Chloride: 101 mEq/L (ref 96–112)
Creatinine, Ser: 0.7 mg/dL (ref 0.50–1.10)
Glucose, Bld: 102 mg/dL — ABNORMAL HIGH (ref 70–99)
HCT: 36 % (ref 36.0–46.0)
Hemoglobin: 12.2 g/dL (ref 12.0–15.0)
Potassium: 4.1 mEq/L (ref 3.5–5.1)
Sodium: 141 mEq/L (ref 135–145)
TCO2: 32 mmol/L (ref 0–100)

## 2012-08-20 LAB — POCT I-STAT TROPONIN I: Troponin i, poc: 0 ng/mL (ref 0.00–0.08)

## 2012-08-20 MED ORDER — PREDNISONE 20 MG PO TABS
40.0000 mg | ORAL_TABLET | Freq: Once | ORAL | Status: AC
  Administered 2012-08-20: 40 mg via ORAL
  Filled 2012-08-20: qty 2

## 2012-08-20 MED ORDER — PREDNISONE 20 MG PO TABS: 40.0000 mg | ORAL_TABLET | Freq: Every day | ORAL | Status: DC

## 2012-08-20 NOTE — ED Provider Notes (Signed)
History     CSN: [id number]  Arrival date & time 08/20/12  1746   First MD Initiated Contact with Patient 08/20/12 1921      Chief Complaint  Patient presents with  . Numbness  . Tingling  . Leg Pain    (Consider location/radiation/quality/duration/timing/severity/associated sxs/prior treatment) HPI Comments: Pt complains of left arm pain that has been present for several weeks, also pain to both legs with pain and occasional numbness sensation pins/needles for months.  She is a former smoker.  She has been seen by PCP several times in the past several months.  She is unsure how much her symptoms have been worked up by PCP.  She thinks she may have had TIA's in the past, reports had MRI of brain a few years ago.  No prior h/o MI.  She initially denies CP, SOB, seats, dizziness.  She later reports she has in the past had some occasional pain around her left chest, non radiating.  Left arm pain is not worse with movement.  Pain seems to start in upper posterior scapula and neck area.  Denies trauam, fever, rash and no weakness in arms or legs.  She fatigues easily.    Patient is a 67 y.o. female presenting with leg pain. The history is provided by the patient.  Leg Pain Associated symptoms: back pain, fatigue and neck pain   Associated symptoms: no fever     Past Medical History  Diagnosis Date  . OCD (obsessive compulsive disorder)   . Depression   . Anxiety   . PUD (peptic ulcer disease) 09/2006    H pylori  . Diverticulosis of colon   . GERD (gastroesophageal reflux disease)   . COPD (chronic obstructive pulmonary disease)     Due to long history of tobacco abuse, still smoking  . History of uterine prolapse 10/2004    Dr Estanislado Pandy  . Thyroid disease     Ho R thyroid noduel plus mild hyperthyroidism. Treated with radioiodine therapty on 04/2006. Dr Linna Hoff.  [name] Heart murmur   . Osteoporosis     Past Surgical History  Procedure Laterality Date  . Colonoscopy  06/03/2005     Hyperplastic polyps, sigmoid diverticulosis, internal hemorroids  . Ganglion removal  12/2005    R wrist subretinacular dorsal ganglion  . Biopsy thyroid  08/2008    Atypia and Hurtle cells, partial thyroidectomy   . Tonsillectomy and adenoidectomy  10/2004  . Pft  11/2006    Obstructive pattern.  Poor response to bronchodilators.   . Thyroidectomy, partial  08/2008    Dr Bertram Savin  . Transthoracic echocardiogram  09/10/2009    Normal systolic function.  EF 55-60%.  Mild MR, atria ok, trivial pericardial effusion  . Carotid ultrasound  09/10/09    No significant extracranial carotid artery stenosis, vertebrals are patent with antegrade flow.   . Mri head  09/20/09    No acute intracranial abn.  Signal abnormality suggestive of chronic small vessel ischemia, maximal in the right subinsular white and deep gray mater.   . Tvt  06/15/10    Tension-free Vaginal Tape, Dr Su Hilt, for urge incontinence  . Abdominal hysterectomy    . Bladder suspension      mesh sling    Family History  Problem Relation Age of Onset  . Heart disease Mother   . Heart disease Maternal Grandmother   . Heart disease Maternal Grandfather   . Cirrhosis Sister   . Hypertension Sister   .  Other Neg Hx     History  Substance Use Topics  . Smoking status: Former Smoker -- 0.10 packs/day for 35 years    Types: Cigarettes    Quit date: 05/25/2012  . Smokeless tobacco: Never Used     Comment: smokes less than 1 pack per week. ready to quitt.  . Alcohol Use: No    OB History   Grav Para Term Preterm Abortions TAB SAB Ect Mult Living   2         2      Review of Systems  Constitutional: Positive for fatigue. Negative for fever and chills.  HENT: Positive for neck pain.   Respiratory: Negative for shortness of breath.   Cardiovascular: Positive for chest pain.  Gastrointestinal: Negative for nausea, vomiting and abdominal pain.  Musculoskeletal: Positive for back pain and arthralgias.  Skin: Negative for color  change, rash and wound.  Neurological: Positive for numbness. Negative for weakness.    Allergies  Amoxicillin; Ciprofloxacin; Haloperidol lactate; Ketorolac tromethamine; and Wellbutrin  Home Medications   Current Outpatient Rx  Name  Route  Sig  Dispense  Refill  . alendronate (FOSAMAX) 70 MG tablet   Oral   Take 70 mg by mouth every 7 (seven) days. Take with a full glass of water on an empty stomach.         . Ascorbic Acid (VITAMIN C) 500 MG tablet   Oral   Take 2,000 mg by mouth daily.          [name] aspirin (HALFPRIN) 162 MG EC tablet   Oral   Take 162 mg by mouth daily.           . calcium-vitamin D (SM CALCIUM-VITAMIN D) 500-200 MG-UNIT per tablet   Oral   Take 1 tablet by mouth 2 (two) times daily.           [name] levothyroxine (SYNTHROID, LEVOTHROID) 75 MCG tablet   Oral   Take 75 mcg by mouth daily before breakfast.         . lisinopril (PRINIVIL,ZESTRIL) 5 MG tablet   Oral   Take 5 mg by mouth daily.         . simvastatin (ZOCOR) 10 MG tablet   Oral   Take 10 mg by mouth every morning.         . venlafaxine XR (EFFEXOR-XR) 75 MG 24 hr capsule   Oral   Take 75 mg by mouth daily.         . vitamin E 400 UNIT capsule   Oral   Take 800 Units by mouth daily.         . predniSONE (DELTASONE) 20 MG tablet   Oral   Take 2 tablets (40 mg total) by mouth daily.   12 tablet   0     BP 112/56  Pulse 77  Temp(Src) 97.6 F (36.4 C)  Resp 23  SpO2 99%  LMP 01/09/2012  Physical Exam  Nursing note and vitals reviewed. Constitutional: She is oriented to person, place, and time. She appears well-developed and well-nourished.  HENT:  Head: Normocephalic and atraumatic.  Cardiovascular: Normal rate, regular rhythm and intact distal pulses.   No extrasystoles are present.  No murmur heard. Pulses:      Radial pulses are 2+ on the right side, and 2+ on the left side.       Dorsalis pedis pulses are 2+ on the right side, and 2+ on the left side.  Pulmonary/Chest: Effort normal. No respiratory distress. She has no wheezes.  Abdominal: Soft. She exhibits no distension. There is no tenderness. There is no rebound.  Neurological: She is alert and oriented to person, place, and time. She displays normal reflexes. No cranial nerve deficit. She exhibits normal muscle tone. Coordination and gait normal.  Reflex Scores:      Patellar reflexes are 1+ on the right side and 1+ on the left side. No arm drift, normal finger to nose bilaterally,   Skin: Skin is warm.  No rash, no skin discoloration, no cyanosis    ED Course  Procedures (including critical care time)  Labs Reviewed  POCT I-STAT, CHEM 8 - Abnormal; Notable for the following:    Glucose, Bld 102 (*)    All other components within normal limits  POCT I-STAT TROPONIN I   No results found.   1. Arm pain, diffuse, left   2. Chronic leg pain, unspecified laterality     ra sat is 98% and I interpret to be normal.  ECG at time 17:59 shows NSR at rate 95, normal axis, right atrial enlargement, borderline ECG.  Right atrial enlargement is new compared to ECG from 08/21/10.  Troponin is neg.  Pt is reassured, I spoke to [facility] resident who will give message to provide clsoe outpt follow up to patient and to assess if steroid anti-inflammatory use is improving pt's symptoms.    MDM  Pt with chronic symptoms.  She takes daily aspirin, no anti-inflammatories.  Non focal neuro exam here, no suspicion for stroke.  Will get ECG and 1 troponin.  Pt encouraged to take anti-inflammatory and to follow up with PCP for further testing for these chronic symptoms.  I reviewed MRI and MRA of head from 7/11 and no strokes, and negative MRA at that time.  Pt is reassured that she indeed did not have narrowed arteries nor stroke at that time.    Pt with h/o OCD, likely pt's continued focus on these possible health concerns is related to OCD.          Gavin Pound. Oletta Lamas, MD 08/20/12 2150

## 2012-08-20 NOTE — ED Notes (Signed)
Per pt sts bilateral leg pain that has been going on for months. sts also pain in left arm and numbness and tingling for 2 weeks. sts she has seen her regular doctor for this. sts also pain in left shoulder, jaw and numbness on the left side of face several days ago.

## 2012-08-21 ENCOUNTER — Telehealth: Payer: Self-pay | Admitting: Family Medicine

## 2012-08-21 NOTE — Telephone Encounter (Signed)
Returned call to patient.  Was given prednisone last night for arm inflammation.  Has "pure red across my nose and cheekbone."  Has had similar symptoms when she has taken prednisone in the past.  Denies any itching, blisters, rash, shortness of breath, or tingling in throat.  States prednisone causes nausea and she has tried taking it with food.  Patient was give #2 tabs from ED yesterday and did not fill Rx.  States she will not fill med unless okay with Dr. Fara Boros.  Will route note to Dr. Fara Boros for advice and call patient back tomorrow.  Gaylene Brooks, RN

## 2012-08-21 NOTE — Telephone Encounter (Signed)
Pt called back and hasn't heard what to do - pls advise

## 2012-08-21 NOTE — Telephone Encounter (Signed)
Pt went to ED last night and was given prednisone - her face is now breaking out from an allergic reaction.  Wants to know what she should do.  They gave her some at the ED, but then they gave her a script and is not sure if there is something different that she can take.  pls advise

## 2012-08-22 NOTE — Telephone Encounter (Signed)
Informed pt that is was fine for her not to take prednisone. Recommended Motrin for pain. Pt verbalized understanding - no further concerns. Wyatt Haste, RN-BSN

## 2012-08-22 NOTE — Telephone Encounter (Signed)
I think it is fine for her NOT to take the prednisone-- I'm not sure how much it will help her arm pain and if it is potentially causing a side effect don't fill it.  She should not have any adverse reaction from NOT taking it. Thanks!

## 2012-08-23 DIAGNOSIS — L723 Sebaceous cyst: Secondary | ICD-10-CM | POA: Diagnosis not present

## 2012-08-26 ENCOUNTER — Emergency Department (INDEPENDENT_AMBULATORY_CARE_PROVIDER_SITE_OTHER)
Admission: EM | Admit: 2012-08-26 | Discharge: 2012-08-26 | Disposition: A | Discharge status: Home / Self Care | Payer: Medicare Other | Source: Home / Self Care

## 2012-08-26 ENCOUNTER — Encounter (HOSPITAL_COMMUNITY): Payer: Self-pay | Admitting: Emergency Medicine

## 2012-08-26 DIAGNOSIS — J329 Chronic sinusitis, unspecified: Secondary | ICD-10-CM | POA: Diagnosis not present

## 2012-08-26 DIAGNOSIS — N390 Urinary tract infection, site not specified: Secondary | ICD-10-CM | POA: Diagnosis not present

## 2012-08-26 LAB — POCT URINALYSIS DIP (DEVICE)
Bilirubin Urine: NEGATIVE
Glucose, UA: NEGATIVE mg/dL
Ketones, ur: NEGATIVE mg/dL
Nitrite: NEGATIVE
Protein, ur: NEGATIVE mg/dL
Specific Gravity, Urine: 1.025 (ref 1.005–1.030)
Urobilinogen, UA: 0.2 mg/dL (ref 0.0–1.0)
pH: 6 (ref 5.0–8.0)

## 2012-08-26 MED ORDER — HYDROCOD POLST-CHLORPHEN POLST 10-8 MG/5ML PO LQCR: 5.0000 mL | Freq: Two times a day (BID) | ORAL | Status: DC | PRN

## 2012-08-26 MED ORDER — CEPHALEXIN 500 MG PO CAPS: 500.0000 mg | ORAL_CAPSULE | Freq: Four times a day (QID) | ORAL | Status: DC

## 2012-08-26 NOTE — ED Provider Notes (Signed)
Medical screening examination/treatment/procedure(s) were performed by non-physician practitioner and as supervising physician I was immediately available for consultation/collaboration.  Leslee Home, M.D.  Reuben Likes, MD 08/26/12 2003

## 2012-08-26 NOTE — ED Notes (Signed)
Pt c/o abd pain onset 1 week and has been getting worse the last past 3 days Sx include: vomiting, HA, loss of appetite but drinking fluids Denies: diarrhea She is alert and oriented w/no signs of acute distress.

## 2012-08-26 NOTE — ED Provider Notes (Signed)
History     CSN: [id number]  Arrival date & time 08/26/12  1647   None     Chief Complaint  Patient presents with  . Abdominal Pain    HPI: Patient is a 67 y.o. female presenting with URI. The history is provided by the patient.  URI Presenting symptoms: congestion and facial pain   Presenting symptoms: no ear pain, no fatigue, no fever, no rhinorrhea and no sore throat   Severity:  Moderate Onset quality:  Gradual Duration:  2 weeks Timing:  Constant Progression:  Worsening Chronicity:  New Relieved by:  Nothing Associated symptoms: headaches and sinus pain   Associated symptoms: no neck pain, no sneezing, no swollen glands and no wheezing   Risk factors: being elderly   Pt reports at least 2 weeks of persistent sinus congestion and  frontal area h/a's that has worsened over the last 3 days. Recently she has developed an occasional dry cough. Denies fever or other URI type symptoms. Pt also c/o nausea that makes it difficult for her to eat. She denies vomiting or diarrhea. At times she has mild abd pain that seems to be generalized and is worse if she attempts to eat. She is able to sip "unsweetened tea" but can't tolerate anything else. States she does urinate a lot but she also drinks a lot. Denies dysuria.   Past Medical History  Diagnosis Date  . OCD (obsessive compulsive disorder)   . Depression   . Anxiety   . PUD (peptic ulcer disease) 09/2006    H pylori  . Diverticulosis of colon   . GERD (gastroesophageal reflux disease)   . COPD (chronic obstructive pulmonary disease)     Due to long history of tobacco abuse, still smoking  . History of uterine prolapse 10/2004    Dr Estanislado Pandy  . Thyroid disease     Ho R thyroid noduel plus mild hyperthyroidism. Treated with radioiodine therapty on 04/2006. Dr Linna Hoff.  [name] Heart murmur   . Osteoporosis     Past Surgical History  Procedure Laterality Date  . Colonoscopy  06/03/2005    Hyperplastic polyps, sigmoid diverticulosis,  internal hemorroids  . Ganglion removal  12/2005    R wrist subretinacular dorsal ganglion  . Biopsy thyroid  08/2008    Atypia and Hurtle cells, partial thyroidectomy   . Tonsillectomy and adenoidectomy  10/2004  . Pft  11/2006    Obstructive pattern.  Poor response to bronchodilators.   . Thyroidectomy, partial  08/2008    Dr Bertram Savin  . Transthoracic echocardiogram  09/10/2009    Normal systolic function.  EF 55-60%.  Mild MR, atria ok, trivial pericardial effusion  . Carotid ultrasound  09/10/09    No significant extracranial carotid artery stenosis, vertebrals are patent with antegrade flow.   . Mri head  09/20/09    No acute intracranial abn.  Signal abnormality suggestive of chronic small vessel ischemia, maximal in the right subinsular white and deep gray mater.   . Tvt  06/15/10    Tension-free Vaginal Tape, Dr Su Hilt, for urge incontinence  . Abdominal hysterectomy    . Bladder suspension      mesh sling    Family History  Problem Relation Age of Onset  . Heart disease Mother   . Heart disease Maternal Grandmother   . Heart disease Maternal Grandfather   . Cirrhosis Sister   . Hypertension Sister   . Other Neg Hx     History  Substance Use  Topics  . Smoking status: Former Smoker -- 0.10 packs/day for 35 years    Types: Cigarettes    Quit date: 05/25/2012  . Smokeless tobacco: Never Used     Comment: smokes less than 1 pack per week. ready to quitt.  . Alcohol Use: No    OB History   Grav Para Term Preterm Abortions TAB SAB Ect Mult Living   2         2      Review of Systems  Constitutional: Negative for fever, chills, appetite change and fatigue.  HENT: Positive for congestion and sinus pressure. Negative for ear pain, sore throat, facial swelling, rhinorrhea, sneezing, neck pain, neck stiffness and ear discharge.   Eyes: Negative.   Respiratory: Negative for wheezing.   Gastrointestinal: Positive for nausea and abdominal pain. Negative for vomiting, diarrhea  and abdominal distention.  Endocrine: Positive for cold intolerance.  Genitourinary: Negative.  Negative for dysuria.  Musculoskeletal: Negative.   Skin: Negative.   Allergic/Immunologic: Negative.   Neurological: Positive for headaches.  Hematological: Negative.   Psychiatric/Behavioral: Negative.     Allergies  Amoxicillin; Ciprofloxacin; Haloperidol lactate; Ketorolac tromethamine; and Wellbutrin  Home Medications   Current Outpatient Rx  Name  Route  Sig  Dispense  Refill  . alendronate (FOSAMAX) 70 MG tablet   Oral   Take 70 mg by mouth every 7 (seven) days. Take with a full glass of water on an empty stomach.         . Ascorbic Acid (VITAMIN C) 500 MG tablet   Oral   Take 2,000 mg by mouth daily.          [name] aspirin (HALFPRIN) 162 MG EC tablet   Oral   Take 162 mg by mouth daily.           . calcium-vitamin D (SM CALCIUM-VITAMIN D) 500-200 MG-UNIT per tablet   Oral   Take 1 tablet by mouth 2 (two) times daily.           [name] levothyroxine (SYNTHROID, LEVOTHROID) 75 MCG tablet   Oral   Take 75 mcg by mouth daily before breakfast.         . lisinopril (PRINIVIL,ZESTRIL) 5 MG tablet   Oral   Take 5 mg by mouth daily.         . predniSONE (DELTASONE) 20 MG tablet   Oral   Take 2 tablets (40 mg total) by mouth daily.   12 tablet   0   . simvastatin (ZOCOR) 10 MG tablet   Oral   Take 10 mg by mouth every morning.         . venlafaxine XR (EFFEXOR-XR) 75 MG 24 hr capsule   Oral   Take 75 mg by mouth daily.         . vitamin E 400 UNIT capsule   Oral   Take 800 Units by mouth daily.           BP 125/79  Pulse 67  Temp(Src) 97.8 F (36.6 C) (Oral)  Resp 18  SpO2 98%  LMP 01/09/2012  Physical Exam  Constitutional: She is oriented to person, place, and time. She appears well-developed and well-nourished.  HENT:  Head: Normocephalic and atraumatic.  Right Ear: Tympanic membrane, external ear and ear canal normal.  Left Ear: Tympanic  membrane, external ear and ear canal normal.  Nose: Mucosal edema present. Right sinus exhibits maxillary sinus tenderness and frontal sinus tenderness. Left sinus exhibits maxillary sinus  tenderness and frontal sinus tenderness.  Mouth/Throat: Uvula is midline, oropharynx is clear and moist and mucous membranes are normal.  Cardiovascular: Normal rate and regular rhythm.   Pulmonary/Chest: Effort normal and breath sounds normal.  Abdominal: Soft. Bowel sounds are normal. She exhibits no distension. There is no tenderness.  Musculoskeletal: Normal range of motion.  Neurological: She is alert and oriented to person, place, and time.  Skin: Skin is warm and dry.  Psychiatric: She has a normal mood and affect.    ED Course  Procedures (including critical care time)  Labs Reviewed - No data to display No results found.   No diagnosis found.    MDM  Persistent symptoms c/w sinusitis. Pt also c/o vague intermittent abd pain, nausea and a mild cough. No vomiting or diarrhea. Denies fever. U/A c/w UTI . Will treat w/ Keflex, Zofran for nausea and a short course of tussionex for cough. Pt encouraged to f/u w/ PCP if sx's persist or worsen.        Leanne Chang, NP 08/26/12 1913

## 2012-08-27 ENCOUNTER — Telehealth (HOSPITAL_COMMUNITY): Payer: Self-pay | Admitting: Family Medicine

## 2012-08-27 MED ORDER — ONDANSETRON 4 MG PO TBDP: 4.0000 mg | ORAL_TABLET | Freq: Four times a day (QID) | ORAL | Status: DC | PRN

## 2012-08-27 MED ORDER — ONDANSETRON 4 MG PO TBDP: 4.0000 mg | ORAL_TABLET | Freq: Three times a day (TID) | ORAL | Status: DC | PRN

## 2012-08-27 MED ORDER — ONDANSETRON 8 MG PO TBDP: 8.0000 mg | ORAL_TABLET | Freq: Three times a day (TID) | ORAL | Status: DC | PRN

## 2012-08-27 NOTE — ED Notes (Signed)
1530  I called and left a message to call.  [name] had already taken care of it, when pt. came here and got a Rx. of Zofran, but I did not know about it.  I called pt. this evening.  She said it was already taken care of when she came up here and got the Rx. of Zofran. She said it was helping. [name] 08/27/2012

## 2012-08-27 NOTE — ED Notes (Signed)
Patient states she has not been able to take Keflex without vomiting afterward.  Chart reviewed by Tama Gander NP.  Patient should have received a prescription for Zofran yesterday but did not.  Patient was given a prescription for Zofran 4mg  OTD 1 every 6 hours PRN.  Patient advised by Natalia Leatherwood to take Zofran then wait 20 minutes, eat a few crackers and then take her medicine.  Patient advised that if this did not work she would need to follow up with her PCP.  Patient was made aware of this plan of care multiple times.  Patient expressed understanding.

## 2012-08-28 LAB — URINE CULTURE: Colony Count: 10000

## 2012-08-30 ENCOUNTER — Ambulatory Visit: Payer: Medicare Other | Admitting: Physical Therapy

## 2012-08-31 ENCOUNTER — Ambulatory Visit: Payer: Medicare Other | Admitting: Physical Therapy

## 2012-09-08 ENCOUNTER — Telehealth: Payer: Self-pay | Admitting: *Deleted

## 2012-09-08 NOTE — Telephone Encounter (Signed)
Pt reports right side pain for months that will not go away.Appointment made for Monday at 4:00. Wyatt Haste, RN-BSN

## 2012-09-11 ENCOUNTER — Ambulatory Visit (INDEPENDENT_AMBULATORY_CARE_PROVIDER_SITE_OTHER): Payer: Medicare Other | Admitting: Family Medicine

## 2012-09-11 ENCOUNTER — Encounter: Payer: Self-pay | Admitting: Family Medicine

## 2012-09-11 VITALS — BP 131/81 | HR 85 | Temp 98.0°F | Wt 106.0 lb

## 2012-09-11 DIAGNOSIS — R079 Chest pain, unspecified: Secondary | ICD-10-CM

## 2012-09-11 DIAGNOSIS — R0781 Pleurodynia: Secondary | ICD-10-CM | POA: Insufficient documentation

## 2012-09-11 MED ORDER — TRAMADOL HCL 50 MG PO TABS: 50.0000 mg | ORAL_TABLET | Freq: Three times a day (TID) | ORAL | Status: DC | PRN

## 2012-09-11 NOTE — Assessment & Plan Note (Signed)
Patient is uncooperative and very poor historian today.  Not sure if she really had a fall on her ribs or not.  Will check chest x-ray.  Placed ace bandage around ribs per patient request.  Rx for tramadol for pain.  Advised to follow up with PCP in 1 week for other complaints.

## 2012-09-11 NOTE — Progress Notes (Signed)
  Subjective:    Patient ID: [name], female    DOB: 1946/02/02, 67 y.o.   MRN: [id number]  HPI  [name] comes in for a walk-in visit and was worked in.  She has multiple complaints.  Her chief complaint is right upper quadrant/rib pain that has been coming and going for 3-4 months.  She denies any aggravating or aleaving factors. She at first denies injury then tells the nurse later that she fell and hit her ribs.  She talks tangentially.  Discusses she is concerned about lung cancer because of this pain, talks a long time about people who smoke in her building.  She used to smoke but quit.   Review of Systems See HPI    Objective:   Physical Exam BP 131/81  Pulse 85  Temp(Src) 98 F (36.7 C) (Oral)  Wt 106 lb (48.081 kg)  BMI 22.74 kg/m2  LMP 01/09/2012 General appearance: alert and no distress Lungs: clear to auscultation bilaterally Chest wall: + tenderness to palpation of R lower ribs.  No step offs or crepitus.  Heart: regular rate and rhythm, S1, S2 normal, no murmur, click, rub or gallop Abdomen: soft, non-tender; bowel sounds normal; no masses,  no organomegaly      Assessment & Plan:

## 2012-09-11 NOTE — Patient Instructions (Signed)
I am sorry your abdomen and ribs are hurting.  Please try taking the tramadol.  Please go get your chest x-ray done.   Please make an appointment to see Dr. Fara Boros in one week.

## 2012-09-19 ENCOUNTER — Ambulatory Visit
Admission: RE | Admit: 2012-09-19 | Discharge: 2012-09-19 | Disposition: A | Discharge status: Home / Self Care | Payer: Medicare Other | Source: Ambulatory Visit | Attending: Family Medicine | Admitting: Family Medicine

## 2012-09-19 ENCOUNTER — Ambulatory Visit: Payer: Medicare Other | Admitting: Family Medicine

## 2012-09-19 DIAGNOSIS — R0781 Pleurodynia: Secondary | ICD-10-CM

## 2012-09-19 DIAGNOSIS — J449 Chronic obstructive pulmonary disease, unspecified: Secondary | ICD-10-CM | POA: Diagnosis not present

## 2012-09-20 ENCOUNTER — Encounter: Payer: Self-pay | Admitting: Family Medicine

## 2012-09-20 ENCOUNTER — Ambulatory Visit (INDEPENDENT_AMBULATORY_CARE_PROVIDER_SITE_OTHER): Payer: Medicare Other | Admitting: Family Medicine

## 2012-09-20 VITALS — Ht <= 58 in | Wt 106.9 lb

## 2012-09-20 DIAGNOSIS — R079 Chest pain, unspecified: Secondary | ICD-10-CM | POA: Diagnosis not present

## 2012-09-20 DIAGNOSIS — R0781 Pleurodynia: Secondary | ICD-10-CM

## 2012-09-20 NOTE — Progress Notes (Signed)
S: Pt comes in today for follow up back pain and x-ray.  She was seen 6/9 for rib pain and CXR was ordered-- "Subtle contour deformity anterior right sixth rib, without evident fracture line. Possibility of extremely subtle rib fracture of uncertain age not excluded."  Her rib injury happened when she was washing the coloring out of her hair and his her right side on the tub.  Pain is ok, worse with laying down or big breaths.  No bloody sputum, no SOB, no difficulty breathing.   ROS: Per HPI  History  Smoking status  . Former Smoker -- 0.10 packs/day for 35 years  . Types: Cigarettes  . Quit date: 05/25/2012  Smokeless tobacco  . Never Used    Comment: smokes less than 1 pack per week. ready to quitt.    OCeasar Mons Vitals:   09/20/12 1349  Height: 4' 9.25" (1.454 m)  Weight: 106 lb 14.4 oz (48.49 kg)   Gen: NAD CV: RRR, no murmur Pulm: CTA bilat, no wheezes or crackles   A/P: 67 y.o. female p/w rib pain -See problem list -f/u in PRN

## 2012-09-20 NOTE — Patient Instructions (Addendum)
It was good to see you today.  Your might have fractured your rib when you hit the tub but your lungs look great.  You can put some ice on your ribs on the right if you need to do something.  You can use Tylenol or Motrin as needed for pain.  Come back in a month or so to meet your new doctor.

## 2012-09-20 NOTE — Assessment & Plan Note (Signed)
CXR with possible rib fracture-- unable to determine if new or old, likely from her fall the other week and the cause of her pain.  Advised ice and discussed natural course and prolonged healing time for this injury. F/u if SOB, cough, etc.

## 2012-09-25 ENCOUNTER — Ambulatory Visit: Payer: Medicare Other | Admitting: Family Medicine

## 2012-09-26 ENCOUNTER — Ambulatory Visit: Payer: Medicare Other | Admitting: Family Medicine

## 2012-09-26 ENCOUNTER — Encounter: Payer: Self-pay | Admitting: Family Medicine

## 2012-09-26 ENCOUNTER — Ambulatory Visit (INDEPENDENT_AMBULATORY_CARE_PROVIDER_SITE_OTHER): Payer: Medicare Other | Admitting: Family Medicine

## 2012-09-26 VITALS — BP 107/72 | HR 97 | Temp 98.0°F | Wt 106.0 lb

## 2012-09-26 DIAGNOSIS — R300 Dysuria: Secondary | ICD-10-CM | POA: Diagnosis not present

## 2012-09-26 LAB — POCT URINALYSIS DIPSTICK
Bilirubin, UA: NEGATIVE
Blood, UA: NEGATIVE
Glucose, UA: NEGATIVE
Ketones, UA: NEGATIVE
Nitrite, UA: NEGATIVE
Protein, UA: NEGATIVE
Spec Grav, UA: 1.01
Urobilinogen, UA: 0.2
pH, UA: 6.5

## 2012-09-26 LAB — POCT UA - MICROSCOPIC ONLY

## 2012-09-26 MED ORDER — SULFAMETHOXAZOLE-TRIMETHOPRIM 800-160 MG PO TABS: 1.0000 | ORAL_TABLET | Freq: Two times a day (BID) | ORAL | Status: DC

## 2012-09-26 NOTE — Patient Instructions (Signed)
It was nice to meet you today.   I am sorry you still are not feeling well. Lets try an antibiotic for the bladder to see if that will make you feel better. If your culture from the urine is not covered by this antibiotic, I will let you know.  Bow Buntyn M. Russell Quinney, M.D.

## 2012-09-26 NOTE — Progress Notes (Signed)
Patient ID: [name], female   DOB: 1946/02/01, 67 y.o.   MRN: [id number]  [facility] Amber M. Hairford, MD Phone: [phone]   Subjective: HPI: Patient is a 67 y.o. female presenting to clinic today for same day appointment for urine problems. Has had a lot of burning/dyruria which has been getting worse. Does not always burn (ie- bad over the weekend, but not today.)  Onset: a few weeks ago   Worsening: burning is worsening Was seen at Urgent Care center one month ago and was started on Keflex for sinuses and UTI. Culture was inconclusive.    Symptoms Urgency: no  Frequency: no  Hesitancy: no  Hematuria: no  Flank Pain: yes, some  Fever: no   Highest Temp: n/a  Nausea/Vomiting: yes  Pregnant: no, s/p hysterectomy STD exposure/history: no Discharge: no Irritants: no Rash: no  Red Flags  : (Risk Factors for Complicated UTI) Recent Antibiotic Usage (last 30 days): yes, keflex  Symptoms lasting more than seven (7) days: yes  More than 3 UTI's last 12 months: no  PMH of  1. DM: no 2. Renal Disease/Calculi: no 3. Urinary Tract Abnormality: no  4. Instrumentation/Trauma: no 5. Immunosuppression: no  Of note, patient was dissatisfied that today was a same day appointment and I could not address her other concerns including wanting to be referred to a back specialist for back pain, wanting referral to podiatry for a foot corn and needing a new Rx for shingles vaccine. I have informed her I would send this to her PCP and I have encouraged her to make an appointment to discuss this with her primary doctor.   History Reviewed: Non smoker, passive smoke in her building.  ROS: Please see HPI above.  Objective: Office vital signs reviewed. BP 107/72  Pulse 97  Temp(Src) 98 F (36.7 C) (Oral)  Wt 106 lb (48.081 kg)  BMI 22.74 kg/m2  LMP 01/09/2012  Physical Examination:  General: Awake, alert. NAD. Very talkative and easily distraacted HEENT:  Atraumatic, normocephalic. MMM. Abdomen: +BS, soft, nontender, nondistended. No CVA tenderness Neuro: Grossly intact  Assessment: 67 y.o. female with dysuria  Plan: See Problem List and After Visit Summary

## 2012-09-26 NOTE — Assessment & Plan Note (Signed)
Patient with one month of intermittent dysuria. UA shows leukocytes, will treat as UTI with 10 day course of Bactrim. Will culture urine to see if it grows anything specific. RTC with fevers, worsening pain, or if she fails to improve. Pt agrees with plan.

## 2012-09-28 LAB — URINE CULTURE: Colony Count: 6000

## 2012-10-10 ENCOUNTER — Encounter: Payer: Self-pay | Admitting: Family Medicine

## 2012-10-10 ENCOUNTER — Ambulatory Visit (INDEPENDENT_AMBULATORY_CARE_PROVIDER_SITE_OTHER): Payer: Medicare Other | Admitting: Family Medicine

## 2012-10-10 VITALS — BP 123/69 | HR 98 | Temp 97.7°F | Ht <= 58 in | Wt 106.0 lb

## 2012-10-10 DIAGNOSIS — M549 Dorsalgia, unspecified: Secondary | ICD-10-CM | POA: Diagnosis not present

## 2012-10-10 MED ORDER — TRAMADOL HCL 50 MG PO TABS: 50.0000 mg | ORAL_TABLET | Freq: Three times a day (TID) | ORAL | Status: DC | PRN

## 2012-10-10 MED ORDER — BACLOFEN 5 MG HALF TABLET: 5.0000 mg | ORAL_TABLET | Freq: Three times a day (TID) | ORAL | Status: DC | PRN

## 2012-10-10 NOTE — Patient Instructions (Addendum)
It was nice to meet you today!  I have given you a prescription for baclofen, which is a muscle relaxer, and for tramadol, which is for pain. You can take these up to 3 times a day as needed. Use caution if you are going to drive because they may make you sleepy.  I am also referring you to physical therapy. Schedule an appointment to come back and meet your new primary doctor and follow up on this back pain.  Be well, Dr. Pollie Meyer

## 2012-10-10 NOTE — Progress Notes (Signed)
Patient ID: [name], female   DOB: 1945/11/04, 67 y.o.   MRN: [id number]  HPI:  Back pain: pt presents for same day appointment to discuss back pain. She has had pain for several months now, which started when she fell in a puddle of water in a store back in March. The pain is located over her lower thoracic spine and radiates down to her lower back and up to her neck. She went to PT for 1-2 months and stopped going in May. She says the back pain hurts all the time. She feels like it is swollen. She had been told in the past she has a curved spine. Rest or sitting still makes it better. She is unable to decide what makes it worse. She feels like it's never going to be better. She has tried taking aspirin which helps some but doesn't entirely relieve the pain. Recently the pain has been worse, which is why she presents today. She does feel like she has some weakness in her legs. Denies fever, saddle anesthesia, or problems with urination or stooling. Had rx for tramadol but states she never got this filled because she just wanted to try aspirin first.  ROS: See HPI. No fevers, saddle anesthesia, urination or stooling problems. + back pain. + perceived LE weakness.  Pt does complain of several other non-emergent problems which she would like addressed today, but I explained to her she will need to f/u with her PCP for these issues as this is a same day appointment and thus we can only address one concern today.  PMFSH: Reviewed in chart - pt has hx of multiple types of pain documented in chart.  PHYSICAL EXAM: BP 123/69  Pulse 98  Temp(Src) 97.7 F (36.5 C) (Oral)  Ht 4' 9.25" (1.454 m)  Wt 106 lb (48.081 kg)  BMI 22.74 kg/m2  LMP 01/09/2012 Gen: NAD HEENT: neck shows full range of motion Heart: RRR Lungs: CTAB Neuro: grossly nonfocal. Speech normal. Ext strength 5/5 bilaterally. Grip 5/5 bilat. Able to walk on toes and heels. Back: marked tenderness to palpation over paraspinal muscles.  Some tenderness over spinous processes, but minimal when compared to paraspinal muscles. Psych: very talkative, difficult to redirect

## 2012-10-15 NOTE — Assessment & Plan Note (Addendum)
Persistent back pain after fall several months ago. Pt endorses some weakness in her legs, however she exhibits good strength on exam today. She has marked tenderness of her paraspinal muscles, suggesting likely MSK and possible muscle spasm as cause of pain. Mild tenderness over spinous processes in thoracic region. Had plain films of lumbar and thoracic spines in March, which did not show any evidence for fracture but did show degenerative changes in both thoracic and lumbar spines. Has only been taking aspirin, which helps pain but not entirely. Response to aspirin makes me less concerned about more serious etiologies of pain, but I do think patient deserves additional workup if her pain persists despite more appropriate pain regimen. Plan: -Will rx baclofen for muscle spasm relief, and tramadol for pain.  -Will refer back to PT to see if she can get any additional benefit. -Instructed pt to schedule f/u appt with her new PCP to follow up on the pain. -If pain still persists, patient may benefit from advanced imaging (MRI) to further elucidate cause of pain.

## 2012-10-17 ENCOUNTER — Ambulatory Visit (INDEPENDENT_AMBULATORY_CARE_PROVIDER_SITE_OTHER): Payer: Medicare Other | Admitting: Family Medicine

## 2012-10-17 ENCOUNTER — Encounter: Payer: Self-pay | Admitting: Family Medicine

## 2012-10-17 VITALS — BP 117/76 | HR 103 | Temp 98.3°F | Ht <= 58 in | Wt 108.0 lb

## 2012-10-17 DIAGNOSIS — E041 Nontoxic single thyroid nodule: Secondary | ICD-10-CM

## 2012-10-17 DIAGNOSIS — R220 Localized swelling, mass and lump, head: Secondary | ICD-10-CM | POA: Diagnosis not present

## 2012-10-17 DIAGNOSIS — R221 Localized swelling, mass and lump, neck: Secondary | ICD-10-CM | POA: Diagnosis not present

## 2012-10-17 DIAGNOSIS — I10 Essential (primary) hypertension: Secondary | ICD-10-CM | POA: Diagnosis not present

## 2012-10-17 DIAGNOSIS — Z711 Person with feared health complaint in whom no diagnosis is made: Secondary | ICD-10-CM

## 2012-10-17 NOTE — Assessment & Plan Note (Signed)
well controlled  

## 2012-10-17 NOTE — Patient Instructions (Addendum)
I don't see any signs of mouth or throat cancer. Nor is their a cyst in your skin.   I recommend follow up with your dentist since the broken off tooth may be the cause of the sore area in your neck.

## 2012-10-17 NOTE — Assessment & Plan Note (Signed)
None palpable on thyroid exam today

## 2012-10-17 NOTE — Assessment & Plan Note (Signed)
I reassured her that my exam did not reveal any signs of cancer of the throat.

## 2012-10-17 NOTE — Assessment & Plan Note (Signed)
Though she could have some small adenopathy related to her broken off tooth, I suspect that I was feeling the right cornu of her thyroid cartilage.

## 2012-10-17 NOTE — Progress Notes (Signed)
  Subjective:    Patient ID: [name], female    DOB: 12-Jan-1946, 67 y.o.   MRN: [id number]  HPI Neck nodule - she's felt a tender nodule on her right larynx externally for 1.5 months. Remote smoking history.   Dental problems - Her dentist noted her right lower tooth broken off  At the gum line, but didn't mention finding signs of infection and didn't do x-rays. It recently has become sensitive in that area. No fever.    Review of Systems     Objective:   Physical Exam  HENT:  Right Ear: External ear normal.  Left Ear: External ear normal.  Nose: Nose normal.  Mouth/Throat: Oropharynx is clear and moist. No oropharyngeal exudate.  Her right lower canine is gone with a few spicules of tooth apparent on the gum. There is no redness, swelling or exudate, but she says it is mildly tender there. No other intraoral pathology is seen on observation or palpation other than other missing teeth.   Neck: No thyromegaly present.  There is a tender 3 mm nodule on the right larynx that actually feels about the same as the left side. Both move up with swallowing. The larynx moves freely. She has a normal voice  Lymphadenopathy:    She has no cervical adenopathy.          Assessment & Plan:

## 2012-10-30 ENCOUNTER — Encounter: Payer: Self-pay | Admitting: Family Medicine

## 2012-10-30 ENCOUNTER — Ambulatory Visit (INDEPENDENT_AMBULATORY_CARE_PROVIDER_SITE_OTHER): Payer: Medicare Other | Admitting: Family Medicine

## 2012-10-30 VITALS — BP 138/90 | HR 91 | Wt 107.0 lb

## 2012-10-30 DIAGNOSIS — H5710 Ocular pain, unspecified eye: Secondary | ICD-10-CM | POA: Insufficient documentation

## 2012-10-30 DIAGNOSIS — R221 Localized swelling, mass and lump, neck: Secondary | ICD-10-CM

## 2012-10-30 DIAGNOSIS — E039 Hypothyroidism, unspecified: Secondary | ICD-10-CM | POA: Diagnosis not present

## 2012-10-30 DIAGNOSIS — R220 Localized swelling, mass and lump, head: Secondary | ICD-10-CM

## 2012-10-30 DIAGNOSIS — Z0000 Encounter for general adult medical examination without abnormal findings: Secondary | ICD-10-CM | POA: Insufficient documentation

## 2012-10-30 DIAGNOSIS — E041 Nontoxic single thyroid nodule: Secondary | ICD-10-CM | POA: Diagnosis not present

## 2012-10-30 DIAGNOSIS — H5711 Ocular pain, right eye: Secondary | ICD-10-CM

## 2012-10-30 MED ORDER — TEARS RENEWED OP SOLN: 1.0000 [drp] | Freq: Three times a day (TID) | OPHTHALMIC | Status: DC

## 2012-10-30 MED ORDER — ZOSTER VACCINE LIVE 19400 UNT/0.65ML ~~LOC~~ SOLR: 0.6500 mL | Freq: Once | SUBCUTANEOUS | Status: DC

## 2012-10-30 NOTE — Assessment & Plan Note (Addendum)
Wrote zostavax prescription to take to [facility]. Also gave her a letter for her apartment stating that a pet would be good for her health.

## 2012-10-30 NOTE — Assessment & Plan Note (Signed)
On exam the nodule identified moved a similar but less prominent nodule on the other side. I am fairly certain this is her hyoid bone. We will check again in 6 months but I don't think there is anything that needs to be done at this point.

## 2012-10-30 NOTE — Progress Notes (Signed)
  Subjective:    Patient ID: [name], female    DOB: [birth year], 67 y.o.   MRN: [id number]  HPI Eye pain: Patient complains of feeling irritation and grittiness in her R eye especially when around smoke in her home. This has been going on for several months. She no longer smokes and is trying to avoid this. She has not noticed any redness, swelling or discharge. She has used visine occasionally but doesn't think it helps much.  Neck lump: She noticed a lump on the R side of her neck about 1 month ago. She does not think it has changed in size. It doesn't hurt.   Review of Systems  Constitutional: Negative for fever and chills.  HENT: Negative for congestion, facial swelling, rhinorrhea, neck pain and sinus pressure.   Eyes: Positive for pain. Negative for discharge, redness, itching and visual disturbance.  Respiratory: Negative for cough and shortness of breath.   Cardiovascular: Negative for chest pain and palpitations.  Gastrointestinal: Negative for diarrhea and constipation.       Denies dysphagia  Endocrine: Negative for cold intolerance and heat intolerance.       Objective:   Physical Exam  Constitutional: She appears well-developed and well-nourished. No distress.  HENT:  Head: Normocephalic and atraumatic.  Eyes: Conjunctivae, EOM and lids are normal. Pupils are equal, round, and reactive to light. Right eye exhibits no chemosis and no discharge. No foreign body present in the right eye. Left eye exhibits no chemosis and no discharge. No foreign body present in the left eye. Right conjunctiva is not injected. Right conjunctiva has no hemorrhage. Left conjunctiva is not injected. Left conjunctiva has no hemorrhage. No scleral icterus.  No redness or swelling bilaterally  Neck: Trachea normal and normal range of motion. Neck supple. No tracheal tenderness and no muscular tenderness present. No tracheal deviation, no edema and no erythema present. No mass and no thyromegaly  present.    Skin: She is not diaphoretic.          Assessment & Plan:

## 2012-10-30 NOTE — Patient Instructions (Addendum)
I believe that the lump you are feeling in your neck is a normal bone that is just slightly off center. There is nothing that needs to be done about this. Also, your thyroid feels normal, we will check a thyroid level today to see if anything has changed.  For your eye pain, I agree that avoiding smoke is the best thing for it. In addition, I have prescribed artificial tears. You should place 1 drop in the affected eye at least three times per day.

## 2012-10-30 NOTE — Assessment & Plan Note (Addendum)
TSH 0.047 in April, dose adjusted to , recheck today

## 2012-10-30 NOTE — Assessment & Plan Note (Signed)
Pt has no objective findings on exam to indicate eye irritation. Wrote for artifical tears TID and recommended avoiding smoke.

## 2012-10-30 NOTE — Assessment & Plan Note (Signed)
No nodules palpable on exam. Results of thyroid u/s in 2012 recommended to biopsy or further testing. Will continue to monitor. Rechecking TSH today.

## 2012-10-31 LAB — TSH: TSH: 0.499 u[IU]/mL (ref 0.350–4.500)

## 2012-11-21 ENCOUNTER — Telehealth: Payer: Self-pay | Admitting: Family Medicine

## 2012-11-21 NOTE — Telephone Encounter (Signed)
Pt came by wanting a call concerning her lab results from last visit

## 2012-11-21 NOTE — Telephone Encounter (Signed)
Will forward to Dr. Adamo.  

## 2012-11-27 ENCOUNTER — Ambulatory Visit (INDEPENDENT_AMBULATORY_CARE_PROVIDER_SITE_OTHER): Payer: Medicare Other | Admitting: Family Medicine

## 2012-11-27 ENCOUNTER — Encounter: Payer: Self-pay | Admitting: Family Medicine

## 2012-11-27 VITALS — BP 129/87 | HR 97 | Temp 98.2°F | Ht <= 58 in | Wt 106.0 lb

## 2012-11-27 DIAGNOSIS — R0981 Nasal congestion: Secondary | ICD-10-CM

## 2012-11-27 DIAGNOSIS — H5789 Other specified disorders of eye and adnexa: Secondary | ICD-10-CM | POA: Insufficient documentation

## 2012-11-27 DIAGNOSIS — J3489 Other specified disorders of nose and nasal sinuses: Secondary | ICD-10-CM

## 2012-11-27 DIAGNOSIS — H579 Unspecified disorder of eye and adnexa: Secondary | ICD-10-CM

## 2012-11-27 NOTE — Assessment & Plan Note (Signed)
Patient symptoms are likely secondary to external exposures including smoke and makeup -Patient was counseled to try to avoid triggers of her symptoms -She can continue to use artificial tears which I recommended that she use at least 3-4 times daily -Recommended that she avoid use of eye makeup for at least the next week to see if this helps to improve her symptoms -She can use warm compresses to the bilateral eyes to help relieve her symptoms

## 2012-11-27 NOTE — Progress Notes (Signed)
  Subjective:    Patient ID: [name], female    DOB: July 01, 1945, 67 y.o.   MRN: [id number]  HPI 67 year old female here for evaluation for one-week history of nasal congestion and "burning of the eyes ". She has had associated rhinorrhea and headaches, denies sick contacts, no fevers or chills , no sore throat, no ear fullness, occasional cough which is chronic, no sputum production, patient admits to exposure to tobacco smoke in her apartment building from adjacent apartments, patient does not smoke or self, she uses order limiting sprays and Lysol to help decrease the smoke exposure in her house, she has not attempted any over-the-counter decongestants or cold and flu remedies, she has been taking three 325 mg aspirin a day to help with her headache, she has not taken ibuprofen or Tylenol, she admits to some mild light sensitivity    Review of Systems  Constitutional: Negative for fever, chills and fatigue.  HENT: Positive for congestion, rhinorrhea and postnasal drip. Negative for hearing loss, ear pain, nosebleeds, sore throat, trouble swallowing and ear discharge.   Eyes: Positive for photophobia, pain and itching. Negative for redness and visual disturbance.  Respiratory: Positive for cough. Negative for shortness of breath and wheezing.   Cardiovascular: Negative for chest pain and palpitations.  Gastrointestinal: Negative for nausea, vomiting and diarrhea.  Allergic/Immunologic: Positive for environmental allergies.       Objective:   Physical Exam Vitals: Reviewed General: Pleasant, energetic female, Caucasian, no acute distress HEENT: Normocephalic, pupils are equal round reactive to light, pupils were 1-2 mm bilaterally, extraocular which were intact, no conjunctival pallor, no scleral icterus or erythema noted, no rhinorrhea, nasal septum midline, moist mucous membranes, uvula midline, no pharyngeal exudate or erythema noted, normal postnasal drip, neck was supple, no anterior  or posterior cervical lymphadenopathy Cardiac: Regular rate and rhythm, S1 and S2 present, no murmurs Respiratory: Clear to auscultation bilaterally, good effort Abdomen: Soft, nontender        Assessment & Plan:

## 2012-11-27 NOTE — Patient Instructions (Addendum)
I recommend that you use the artificial tears at least 3-4 times per day, do not use eye makeup for the next 5-7 days, may use warm compresses over your eyes 2-3 times per day  If you continue to have nasal congestion you may use over the counter decongestants.  Please call if your symptoms do not improve.

## 2012-11-27 NOTE — Assessment & Plan Note (Signed)
Likely related to viral illness, low suspicion for bacterial infection at this time -Conservative management with over-the-counter decongestants and cough/cold medications recommended -Patient was counseled to limit her aspirin intake to one tablet daily

## 2012-12-07 ENCOUNTER — Ambulatory Visit: Payer: Medicare Other | Admitting: Family Medicine

## 2012-12-19 ENCOUNTER — Telehealth: Payer: Self-pay | Admitting: Family Medicine

## 2012-12-19 ENCOUNTER — Encounter: Payer: Self-pay | Admitting: Family Medicine

## 2012-12-19 ENCOUNTER — Ambulatory Visit (INDEPENDENT_AMBULATORY_CARE_PROVIDER_SITE_OTHER): Payer: Medicare Other | Admitting: Family Medicine

## 2012-12-19 VITALS — BP 106/58 | HR 82 | Temp 98.6°F | Ht <= 58 in | Wt 105.0 lb

## 2012-12-19 DIAGNOSIS — J329 Chronic sinusitis, unspecified: Secondary | ICD-10-CM

## 2012-12-19 DIAGNOSIS — J3489 Other specified disorders of nose and nasal sinuses: Secondary | ICD-10-CM

## 2012-12-19 DIAGNOSIS — R0981 Nasal congestion: Secondary | ICD-10-CM

## 2012-12-19 DIAGNOSIS — M549 Dorsalgia, unspecified: Secondary | ICD-10-CM | POA: Diagnosis not present

## 2012-12-19 MED ORDER — DOXYCYCLINE HYCLATE 200 MG PO TBEC: 200.0000 mg | DELAYED_RELEASE_TABLET | Freq: Every day | ORAL | Status: DC

## 2012-12-19 NOTE — Assessment & Plan Note (Signed)
Seen for this condition on 11/27/12 and continues with symptoms now with other elements that indicate the use of Abx. She reports allergy to Amoxicillin and Cipro P/ Doxycycline for 5 days and f/u as needed.

## 2012-12-19 NOTE — Patient Instructions (Addendum)

## 2012-12-19 NOTE — Assessment & Plan Note (Signed)
Pain seems chronic in nature. No elements of history or physical exam that raises red flags. Pt could not go to PT due to financial limitations. This is something she can discuss with PCP and instructed to make an appointment to discuss this issue. No changes on her current regimen.

## 2012-12-19 NOTE — Progress Notes (Signed)
Family Medicine Office Visit Note   Subjective:   Patient ID: [name], female  DOB: 21-Dec-1945, 67 y.o.. MRN: [id number]   Pt that comes today complaining of chronic back pain and  left sinus pain.  Back pain: pt reports this is chronic and has not worsen since her last evaluation in 10/2012. She comes requesting [place] to send her to a Physical Therapy that her insurance will pay for since she could no afford the once was set. Denies saddle anesthesia, or incontinence.  Left sinus pain: pt has hx of Allergic Rhinitis and was seen last month for Sinus Congestion now reports for the past 4-5 days she feels pain on her left maxillary sinus and white nasal rhinorrhea. She reports decreased in the sense of smell and sometimes smelling purulent odor. Denies headaches, nausea, vomiting, fever or chills. No cough or other respiratory symptoms.  Review of Systems:  Per HPI.  Objective:   Physical Exam:  Gen:  NAD HEENT: Moist mucous membranes. Transilumination of left maxillary sinus seems less intense than the rest of sinuses. Tenderness to palpation Nose:No purulent nasal discharge observed, nasal mucosa pale. Ears: normal ear canal and TM bilaterally.  Neck: no cervical lymphoadenopathy. Supple. CV: Regular rate and rhythm, no murmurs rubs or gallops PULM: Clear to auscultation bilaterally. No wheezes/rales/rhonchi ABD: Soft, non tender, non distended, normal bowel sounds EXT: No edema Neuro: Alert and oriented x3. No focalizatio Back - Normal skin, Spine with normal alignment and no deformity.  No tenderness to vertebral process palpation.  Paraspinous muscles are not tender and without spasm.   Range of motion is full at neck, thoracic and  Lumbar.  Assessment & Plan:

## 2012-12-19 NOTE — Telephone Encounter (Signed)
Pt would like a letter to take to the courthouse so she can be excused from jury duty due to bad nerves, ADD disorder Please mail the letter She doesn't know when jury duty is

## 2012-12-29 ENCOUNTER — Telehealth: Payer: Self-pay | Admitting: Family Medicine

## 2012-12-29 NOTE — Telephone Encounter (Signed)
Called pt. [name] to call back. Please ask pt the date of her jury duty. Also will fwd. Request to her PCP. Thank you. Lorenda Hatchet, Renato Battles

## 2012-12-29 NOTE — Telephone Encounter (Signed)
Pt called again asking about her letter excusing her from jury duty. First phone note was sent 12-19-12. Pt says she has "been looking everyday" for this letter. Please advise

## 2012-12-31 NOTE — Telephone Encounter (Signed)
I wrote letter after first request and forwarded to admin pool for mailing. Do I need to do something else with it?

## 2013-01-01 ENCOUNTER — Other Ambulatory Visit: Payer: Self-pay | Admitting: Family Medicine

## 2013-01-01 DIAGNOSIS — J329 Chronic sinusitis, unspecified: Secondary | ICD-10-CM

## 2013-01-01 MED ORDER — DOXYCYCLINE HYCLATE 100 MG PO TABS: 100.0000 mg | ORAL_TABLET | Freq: Two times a day (BID) | ORAL | Status: DC

## 2013-01-01 NOTE — Telephone Encounter (Signed)
Called pt.[name] to call back. Please tell pt, that her letter was mailed. If she did not receive it, I also printed out the letter and put it up front for her to pick it up (excuse for jury duty) Thank you. Lorenda Hatchet, Renato Battles

## 2013-01-11 ENCOUNTER — Encounter: Payer: Self-pay | Admitting: Family Medicine

## 2013-01-11 ENCOUNTER — Ambulatory Visit (INDEPENDENT_AMBULATORY_CARE_PROVIDER_SITE_OTHER): Payer: Medicare Other | Admitting: Family Medicine

## 2013-01-11 VITALS — BP 143/88 | HR 98 | Temp 98.0°F | Ht <= 58 in | Wt 108.0 lb

## 2013-01-11 DIAGNOSIS — R300 Dysuria: Secondary | ICD-10-CM

## 2013-01-11 DIAGNOSIS — I10 Essential (primary) hypertension: Secondary | ICD-10-CM | POA: Diagnosis not present

## 2013-01-11 DIAGNOSIS — E041 Nontoxic single thyroid nodule: Secondary | ICD-10-CM | POA: Diagnosis not present

## 2013-01-11 DIAGNOSIS — R197 Diarrhea, unspecified: Secondary | ICD-10-CM

## 2013-01-11 DIAGNOSIS — J019 Acute sinusitis, unspecified: Secondary | ICD-10-CM

## 2013-01-11 LAB — POCT URINALYSIS DIPSTICK
Bilirubin, UA: NEGATIVE
Blood, UA: NEGATIVE
Glucose, UA: NEGATIVE
Ketones, UA: NEGATIVE
Leukocytes, UA: NEGATIVE
Nitrite, UA: NEGATIVE
Protein, UA: NEGATIVE
Spec Grav, UA: 1.015
Urobilinogen, UA: 0.2
pH, UA: 6

## 2013-01-11 MED ORDER — SULFAMETHOXAZOLE-TMP DS 800-160 MG PO TABS: 1.0000 | ORAL_TABLET | Freq: Two times a day (BID) | ORAL | Status: DC

## 2013-01-11 MED ORDER — AZITHROMYCIN 250 MG PO TABS: 250.0000 mg | ORAL_TABLET | Freq: Two times a day (BID) | ORAL | Status: DC

## 2013-01-11 NOTE — Assessment & Plan Note (Signed)
Elevated today to 143/88 but low to normal on all recent checks - will not adjust treatment, re-eval in 3 months - continue lisinopril 5mg 

## 2013-01-11 NOTE — Assessment & Plan Note (Addendum)
Patient never completed treatment due to side effects from doxy and is still complaining of sinus pain/pressure and rhinorrhea - patient allergic to amox and cipro - bactrim bid for 5 days

## 2013-01-11 NOTE — Assessment & Plan Note (Signed)
5 days, likely viral gastro, resolving - rec increased fluids

## 2013-01-11 NOTE — Progress Notes (Signed)
  Subjective:    Patient ID: [name], female    DOB: 08-Apr-1945, 67 y.o.   MRN: [id number]  HPI [name] presents for f/u of her sinusitis as well as new dysuria and diarrhea. Her sinus pain has continued to be an issue since she was seen in September. She did not complete the prescribed course of doxy because of gastrointestinal distress. She still has a runny nose as well, worse on the left side. She denies cough and sore throat.   She complains of 3 days of burning with urination. She denies frequency or urgency.   She has had about 5 days of diarrhea and mild abdominal pain. It started after eating 3 orders of grits from [employer]. It is improving over the last few days. She denies fever, n/v. Endorses some mild abdominal pain.   Review of Systems See HPI    Objective:   Physical Exam  Constitutional: She is oriented to person, place, and time. She appears well-developed and well-nourished. No distress.  HENT:  Head: Normocephalic and atraumatic.  Right Ear: External ear normal.  Left Ear: External ear normal.  Mouth/Throat: Oropharynx is clear and moist. No oropharyngeal exudate.  Left facial tenderness  Eyes: Conjunctivae and EOM are normal. Right eye exhibits no discharge. Left eye exhibits no discharge. No scleral icterus.  Neck: Normal range of motion. Neck supple. No tracheal deviation present. No thyromegaly present.  Cardiovascular: Normal rate, regular rhythm, normal heart sounds and intact distal pulses.   No murmur heard. Pulmonary/Chest: Effort normal and breath sounds normal. No respiratory distress. She has no wheezes.  Abdominal: Soft. Bowel sounds are normal. She exhibits no distension.  Suprapubic tenderness  Musculoskeletal: She exhibits no edema and no tenderness.  Neurological: She is alert and oriented to person, place, and time.  Skin: Skin is warm and dry. No rash noted. She is not diaphoretic.          Assessment & Plan:  See problem list

## 2013-01-11 NOTE — Patient Instructions (Addendum)
Today we discussed your persistent sinus issues, your burning with urination and your thyroid nodules. I have sent in another antibiotic for this that you will need to take for five days, this will also get rid of any urinary tract infection that you might have. We have referred you for an ultrasound of your thyroid to evaluate growth of your previously identified nodules.   Thank you,  Dr. Richarda Blade

## 2013-01-11 NOTE — Assessment & Plan Note (Addendum)
Dysuria, no frequency/urgency - ua negative today - treating with bactrim for sinusitis so covered anyway - may be related to dehydration, counseled to increase fluids

## 2013-01-11 NOTE — Assessment & Plan Note (Signed)
H/o benign thyroid nodules and hemithyroidectomy  - told to have them scanned periodically - no palpable nodules on exam - ordered thyroid ultrasound - continue synthroid, TSH normal in July

## 2013-01-17 ENCOUNTER — Telehealth: Payer: Self-pay | Admitting: Family Medicine

## 2013-01-17 ENCOUNTER — Ambulatory Visit (HOSPITAL_COMMUNITY): Payer: Medicare Other

## 2013-01-17 DIAGNOSIS — J329 Chronic sinusitis, unspecified: Secondary | ICD-10-CM

## 2013-01-17 NOTE — Telephone Encounter (Signed)
Patient needs another antiobiotic, original one made her very nauseous. Also, patient need referral to rehab for her back ( anywhere but [facility]).

## 2013-01-18 ENCOUNTER — Ambulatory Visit (HOSPITAL_COMMUNITY): Admission: RE | Admit: 2013-01-18 | Payer: Medicare Other | Source: Ambulatory Visit

## 2013-01-18 NOTE — Telephone Encounter (Signed)
Called pt. [name] for pt to call back. .[name]  

## 2013-01-19 MED ORDER — AZITHROMYCIN 250 MG PO TABS: ORAL_TABLET | ORAL | Status: DC

## 2013-01-19 NOTE — Telephone Encounter (Signed)
Please advise patient that new prescription for azithromycin sent to her pharmacy for her sinus infection.

## 2013-01-19 NOTE — Telephone Encounter (Signed)
Called pt. [name] 'please check your pharmacy' .[name]

## 2013-01-19 NOTE — Telephone Encounter (Signed)
Pt called back and reports, that the ABX (Bactrim) made her sick and nauseated, she stopped it after a few days. She denies dysuria. I told the pt, that would probably be okay.  Pt also request ABX for her sinus problems. She recently took a Z-pack. It helped. I told the pt that I am not sure, if Dr.Adamo will prescribe another ABX.  Fwd. To Dr.Adamo for review. Lorenda Hatchet, Renato Battles

## 2013-01-22 ENCOUNTER — Ambulatory Visit (HOSPITAL_COMMUNITY)
Admission: RE | Admit: 2013-01-22 | Discharge: 2013-01-22 | Disposition: A | Discharge status: Home / Self Care | Payer: Medicare Other | Source: Ambulatory Visit | Attending: Family Medicine | Admitting: Family Medicine

## 2013-01-22 DIAGNOSIS — E0789 Other specified disorders of thyroid: Secondary | ICD-10-CM | POA: Insufficient documentation

## 2013-01-22 DIAGNOSIS — E041 Nontoxic single thyroid nodule: Secondary | ICD-10-CM

## 2013-01-23 ENCOUNTER — Telehealth: Payer: Self-pay

## 2013-01-23 DIAGNOSIS — M545 Low back pain, unspecified: Secondary | ICD-10-CM

## 2013-01-23 NOTE — Telephone Encounter (Signed)
Patient would like to speak to [facility] about her ultrasound yesterday.

## 2013-01-23 NOTE — Telephone Encounter (Signed)
Called pt.

## 2013-01-23 NOTE — Telephone Encounter (Signed)
Called pt back and gave partical results. I told the pt that her PCP will look over the report and pt wants her PCP to call her. Fwd. To PCP. Marland KitchenArlyss Repress

## 2013-01-23 NOTE — Telephone Encounter (Signed)
Patient contacted, would like a referral for a different PT with lower copays. Advised her that this may not be possible but we can try someone else. New referral entered. Please call patient when an appointment has been made.   Patient also complaining of new lump under her chin, sounds like a lymph node and patient has sinus infection currently so advised her to see is if not resolved in 2-3 weeks.

## 2013-01-24 NOTE — Telephone Encounter (Signed)
Called pt.  1.)Explained to the pt that we don't know which place would charge less than $20 co-pay. Usually co-pays for OV, PT, ED are always the same. Pt verbalized understanding. 2.) pt request 'picture' of her thyroid nodule, so she can look at it. I advised the pt to go to the Radiology Department. They may give her a copy of her x-ray. We do not have copies here. Pt agreed. Lorenda Hatchet, Renato Battles

## 2013-01-26 ENCOUNTER — Telehealth: Payer: Self-pay | Admitting: Family Medicine

## 2013-01-26 NOTE — Telephone Encounter (Signed)
Forward to PCP for refill request.[name]  

## 2013-01-26 NOTE — Telephone Encounter (Signed)
Patient is needing a refill of ambien sent to [facility] on [address].

## 2013-01-27 MED ORDER — ZOLPIDEM TARTRATE 5 MG PO TABS: 5.0000 mg | ORAL_TABLET | Freq: Every evening | ORAL | Status: DC | PRN

## 2013-01-27 NOTE — Telephone Encounter (Signed)
Refill called in on 10/25. Please call to inform patient. Thank you.

## 2013-02-01 NOTE — Telephone Encounter (Signed)
Patient never heard from anyone about refill request. Advised patient that refill were sent on 10/25. She states that pharmacy never received it. Please call in Ambien for patient.

## 2013-02-27 DIAGNOSIS — F429 Obsessive-compulsive disorder, unspecified: Secondary | ICD-10-CM | POA: Diagnosis not present

## 2013-02-27 DIAGNOSIS — IMO0002 Reserved for concepts with insufficient information to code with codable children: Secondary | ICD-10-CM | POA: Diagnosis not present

## 2013-02-28 ENCOUNTER — Ambulatory Visit (INDEPENDENT_AMBULATORY_CARE_PROVIDER_SITE_OTHER): Payer: Medicare Other | Admitting: Family Medicine

## 2013-02-28 ENCOUNTER — Encounter: Payer: Self-pay | Admitting: Family Medicine

## 2013-02-28 VITALS — BP 102/60 | HR 88 | Temp 97.7°F | Ht <= 58 in | Wt 111.5 lb

## 2013-02-28 DIAGNOSIS — J3489 Other specified disorders of nose and nasal sinuses: Secondary | ICD-10-CM | POA: Diagnosis not present

## 2013-02-28 DIAGNOSIS — R0981 Nasal congestion: Secondary | ICD-10-CM

## 2013-02-28 MED ORDER — FLUTICASONE PROPIONATE 50 MCG/ACT NA SUSP: 2.0000 | Freq: Every day | NASAL | Status: DC

## 2013-02-28 NOTE — Patient Instructions (Addendum)
Please use Flonase as prescribed.   You may use OTC decongestants - typically contain Pseudoephedrine.   Also use Nasal saline to help with dryness/bleeding.  Follow up with Your PCP if no improvement.

## 2013-03-03 NOTE — Assessment & Plan Note (Signed)
Likely secondary to chronic sinusitis.  Rx sent for Flonase. Advised use of OTC Decongestants and Nasal saline.

## 2013-03-03 NOTE — Progress Notes (Signed)
Subjective:     Patient ID: [name], female   DOB: [birth year], 67 y.o.   MRN: [id number]  HPI 67 year old female with an extensive PMH presents for follow up regarding sinusitis.  1) Sinusitis - Patient has been seen previously (3 times) for this since 8/25. She reports symptoms have been present for ~ 3 months.  - She recently completed a course of Doxycyline. - Today, she reports continued symptoms.  She reports rhinorrhea, congestion, headache (pain located behind the eyes).  She states that her symptoms have persisted despite Antibiotics.  - She denies fever, purulent nasal drainage. - Of note, she reports a small amount of blood after wiping/blowing her nose ~ 2 days ago.  Review of Systems Per HPI with the following additions: Patient reports some dizziness/lightheadedness.     Objective:   Physical Exam Exam: General: well appearing elderly lady in NAD. HEENT: NCAT. No pharyngeal erythema or tonsillar exudate. Normal TM's bilaterally. Mild sinus tenderness on palpation. Cardiovascular: RRR. No murmurs, rubs, or gallops. Respiratory: CTAB. No rales, rhonchi, or wheeze.    Assessment:     See Problem List     Plan:

## 2013-03-07 ENCOUNTER — Other Ambulatory Visit: Payer: Self-pay | Admitting: Family Medicine

## 2013-04-24 DIAGNOSIS — IMO0002 Reserved for concepts with insufficient information to code with codable children: Secondary | ICD-10-CM | POA: Diagnosis not present

## 2013-05-04 DIAGNOSIS — F332 Major depressive disorder, recurrent severe without psychotic features: Secondary | ICD-10-CM | POA: Diagnosis not present

## 2013-05-09 DIAGNOSIS — F29 Unspecified psychosis not due to a substance or known physiological condition: Secondary | ICD-10-CM | POA: Diagnosis not present

## 2013-05-09 DIAGNOSIS — E039 Hypothyroidism, unspecified: Secondary | ICD-10-CM | POA: Diagnosis not present

## 2013-05-16 ENCOUNTER — Telehealth: Payer: Self-pay | Admitting: Family Medicine

## 2013-05-16 NOTE — Telephone Encounter (Signed)
Pt came in stating that

## 2013-05-17 ENCOUNTER — Telehealth: Payer: Self-pay | Admitting: Family Medicine

## 2013-05-17 ENCOUNTER — Ambulatory Visit (INDEPENDENT_AMBULATORY_CARE_PROVIDER_SITE_OTHER): Payer: Medicare Other | Admitting: Emergency Medicine

## 2013-05-17 ENCOUNTER — Encounter: Payer: Self-pay | Admitting: Emergency Medicine

## 2013-05-17 VITALS — BP 141/88 | HR 110 | Ht <= 58 in | Wt 112.8 lb

## 2013-05-17 DIAGNOSIS — R1011 Right upper quadrant pain: Secondary | ICD-10-CM

## 2013-05-17 DIAGNOSIS — J309 Allergic rhinitis, unspecified: Secondary | ICD-10-CM | POA: Diagnosis not present

## 2013-05-17 LAB — COMPLETE METABOLIC PANEL WITH GFR
ALT: 10 U/L (ref 0–35)
AST: 13 U/L (ref 0–37)
Albumin: 4.4 g/dL (ref 3.5–5.2)
Alkaline Phosphatase: 92 U/L (ref 39–117)
BUN: 14 mg/dL (ref 6–23)
CO2: 31 mEq/L (ref 19–32)
Calcium: 10.8 mg/dL — ABNORMAL HIGH (ref 8.4–10.5)
Chloride: 101 mEq/L (ref 96–112)
Creat: 0.75 mg/dL (ref 0.50–1.10)
GFR, Est African American: >89 mL/min
GFR, Est Non African American: 82 mL/min
Glucose, Bld: 108 mg/dL — ABNORMAL HIGH (ref 70–99)
Potassium: 4.1 mEq/L (ref 3.5–5.3)
Sodium: 142 mEq/L (ref 135–145)
Total Bilirubin: 0.3 mg/dL (ref 0.2–1.2)
Total Protein: 6.3 g/dL (ref 6.0–8.3)

## 2013-05-17 LAB — CBC WITH DIFFERENTIAL/PLATELET
Basophils Absolute: 0 10*3/uL (ref 0.0–0.1)
Basophils Relative: 0 % (ref 0–1)
Eosinophils Absolute: 0.1 10*3/uL (ref 0.0–0.7)
Eosinophils Relative: 1 % (ref 0–5)
HCT: 40.4 % (ref 36.0–46.0)
Hemoglobin: 13.7 g/dL (ref 12.0–15.0)
Lymphocytes Relative: 16 % (ref 12–46)
Lymphs Abs: 1.3 10*3/uL (ref 0.7–4.0)
MCH: 31.6 pg (ref 26.0–34.0)
MCHC: 33.9 g/dL (ref 30.0–36.0)
MCV: 93.3 fL (ref 78.0–100.0)
Monocytes Absolute: 0.5 10*3/uL (ref 0.1–1.0)
Monocytes Relative: 6 % (ref 3–12)
Neutro Abs: 5.9 10*3/uL (ref 1.7–7.7)
Neutrophils Relative %: 77 % (ref 43–77)
Platelets: 338 10*3/uL (ref 150–400)
RBC: 4.33 MIL/uL (ref 3.87–5.11)
RDW: 13.3 % (ref 11.5–15.5)
WBC: 7.8 10*3/uL (ref 4.0–10.5)

## 2013-05-17 MED ORDER — PROMETHAZINE HCL 12.5 MG PO TABS: 12.5000 mg | ORAL_TABLET | Freq: Three times a day (TID) | ORAL | Status: DC | PRN

## 2013-05-17 NOTE — Assessment & Plan Note (Signed)
History and exam concerning for gallbladder disease. Will check CBC and CMP today.  Suspect will be okay. Abdominal ultrasound ordered, scheduling will depend on if she gets admitted for psych issues. Discussed avoiding greasy foods. Phenergan for nausea. F/u with PCP in 2-4 weeks.

## 2013-05-17 NOTE — Patient Instructions (Signed)
It was nice to meet you!  For your sinuses... 1. Use a q-tip to apply a pea sized amount of neosporin to your nostrils at bedtime.  This will keep the lining of your nose nice and moist like it likes. 2. Get loratadine at the drug store.  Take 1/2 a tablet daily.    For the stomach... - I think you might have stones in your gallbladder. - we are checking some blood work today.  If anything is wrong I will call you.  If you end up getting admitted to [facility], let our office know so I can reach you there if needed. - we will contact you about scheduling the ultrasound in the next week or so. - use the phenergan as needed to help with the nausea. - Avoid fatty, greasy foods as they will make the gallbladder mad.  Follow up with your regular doctor in 2 weeks.

## 2013-05-17 NOTE — Assessment & Plan Note (Signed)
Having nose bleeds and congestion. Discussed neosporin to provide moisture. Also start loratadine to help with allergies as she states she cannot use the nasal sprays.

## 2013-05-17 NOTE — Progress Notes (Signed)
Subjective:    Patient ID: [name], female    DOB: [birth year], 68 y.o.   MRN: [id number]  HPI [name] is here for a SDA for sinus issue and stomach pain.  Sinus Issue She reports congestion and nose bleeds for the last few weeks.  She states she cannot use nasal sprays.  Denies sinus pressure or fevers.  States her nose feels dry.  Stomach pain She reports nausea and epigastric for the last week or since [facility] increased her Effexor to 225mg  daily.  She states it is worse after eating, particularly greasy foods.  Pain is located in the epigastric area, described as crampy.  Did have some vomiting after a particularly greasy meal.  Pain typical subsides after an hour or so.  No fevers, no active pain currently.  Patient states she is planning on being evaluated for psychiatric admission tomorrow.  Current Outpatient Prescriptions on File Prior to Visit  Medication Sig Dispense Refill  . alendronate (FOSAMAX) 70 MG tablet Take 70 mg by mouth every 7 (seven) days. Take with a full glass of water on an empty stomach.      . Ascorbic Acid (VITAMIN C) 500 MG tablet Take 2,000 mg by mouth daily.       [name] aspirin (HALFPRIN) 162 MG EC tablet Take 162 mg by mouth daily.        [name] azithromycin (ZITHROMAX) 250 MG tablet Take two tablets on day 1 and 1 tablet on days 2-5.  6 tablet  0  . baclofen (LIORESAL) 5 mg TABS Take 0.5 tablets (5 mg total) by mouth 3 (three) times daily as needed (back spasm).  20 tablet  0  . calcium-vitamin D (SM CALCIUM-VITAMIN D) 500-200 MG-UNIT per tablet Take 1 tablet by mouth 2 (two) times daily.        [name] dextran 70-hypromellose (TEARS RENEWED) ophthalmic solution Place 1 drop into the right eye 3 (three) times daily.  15 mL  12  . doxycycline (VIBRA-TABS) 100 MG tablet Take 1 tablet (100 mg total) by mouth 2 (two) times daily.  10 tablet  0  . fluticasone (FLONASE) 50 MCG/ACT nasal spray Place 2 sprays into both nostrils daily.  16 g  6  . levothyroxine  (SYNTHROID, LEVOTHROID) 75 MCG tablet Take 75 mcg by mouth daily before breakfast.      . lisinopril (PRINIVIL,ZESTRIL) 5 MG tablet TAKE 1 TABLET (5 MG TOTAL) BY MOUTH DAILY.  30 tablet  6  . ondansetron (ZOFRAN ODT) 4 MG disintegrating tablet Take 1 tablet (4 mg total) by mouth every 6 (six) hours as needed for nausea.  20 tablet  0  . ondansetron (ZOFRAN ODT) 8 MG disintegrating tablet Take 1 tablet (8 mg total) by mouth every 8 (eight) hours as needed for nausea.  20 tablet  0  . promethazine (PHENERGAN) 12.5 MG tablet Take 1 tablet (12.5 mg total) by mouth every 6 (six) hours as needed for nausea.  30 tablet  0  . promethazine (PHENERGAN) 25 MG suppository Place 0.5 suppositories (12.5 mg total) rectally every 6 (six) hours as needed for nausea.  12 each  0  . simvastatin (ZOCOR) 10 MG tablet Take 10 mg by mouth every morning.      . sulfamethoxazole-trimethoprim (BACTRIM DS) 800-160 MG per tablet Take 1 tablet by mouth 2 (two) times daily.  10 tablet  0  . traMADol (ULTRAM) 50 MG tablet Take 1 tablet (50 mg total) by mouth every 8 (  eight) hours as needed for pain.  30 tablet  0  . venlafaxine XR (EFFEXOR-XR) 75 MG 24 hr capsule Take 75 mg by mouth daily.      . vitamin E 400 UNIT capsule Take 800 Units by mouth daily.      [name] zolpidem (AMBIEN) 5 MG tablet Take 1 tablet (5 mg total) by mouth at bedtime as needed.  30 tablet  2   No current facility-administered medications on file prior to visit.    I have reviewed and updated the following as appropriate: allergies and current medications SHx: current smoker  Review of Systems See HPI    Objective:   Physical Exam BP 141/88  Pulse 110  Ht 4\' 9"  (1.448 m)  Wt 112 lb 12.8 oz (51.166 kg)  BMI 24.40 kg/m2  LMP 01/09/2012 Gen: alert, cooperative, NAD, intermittently tearful.  HEENT: AT/[place], sclera white, MMM, dry nasal mucosa with crusted blood present Neck: supple, no LAD CV: RRR, no murmurs Pulm: CTAB, no wheezes or rales Abd: +BS,  soft, ND, tender in RUQ, positive murphy's sign; no rebound or guarding      Assessment & Plan:

## 2013-05-17 NOTE — Telephone Encounter (Signed)
Called pt. No answer. Waiting for call back and also will try later. Please tell pt to have [place] fax [place] the lab results. She needs to sign a ROI there. Please give our fax number. Thanks. Javier Glazier, Gerrit Heck

## 2013-05-17 NOTE — Telephone Encounter (Signed)
Pt has SDA today, 05/17/13 at 4:15 pm to see Dr. Bridgett Larsson for depression and nausea.  [name], [name], [name]

## 2013-05-17 NOTE — Telephone Encounter (Signed)
Pt came in stating that Dr. Sherril Cong need to contact [facility] on [address] Dr Theodoro Clock to get results of thyroid lab work Dr Theodoro Clock said that thyroid maybe causing a severe depression.

## 2013-05-17 NOTE — Telephone Encounter (Incomplete)
Pt came in stating that Dr. Sherril Cong need's to contact [facility] on [address]. And Dr. Theodoro Clock to get results of thyroid lab work..Accutane is discussed fully with the patient. It is a very effective drug to treat acne vulgaris but has many significant side effects. Chief among these are teratogenesis, hepatic injury, dyslipidemia and severe drying of the mucous membranes. All of these issues have been discussed in details. Monthly blood tests to monitor lipids and liver functions will be necessary. Expect painful dryness and/or fissuring around the lips, eyes, and other moist areas of the body. Balms may be protective. Contact lens may be too painful to wear temporarily while on this drug. Episodes of significant depression have been reported, including suicidal ideation and attempts in rare cases. It may also cause pseudotumor cerebri and hyperostosis. The patient will report any such changes in mood, depressive symptoms or suicidal thoughts, headaches, joint or bone pains.  Female patients MUST use two simultaneous methods of family planning. Accutane is Category X for pregnancy, meaning it will cause fetal teratogenic malformations, and pregnancy MUST be avoided while on this drug.  The dose is 0.5-1 mg per kg in two divided doses for 15-20 weeks.  After discussion of these important issues, she indicates complete understanding of all of the above, and {is/are:315283::"does"} wish to proceed with accutane therapy.[name]

## 2013-05-18 ENCOUNTER — Telehealth: Payer: Self-pay | Admitting: Emergency Medicine

## 2013-05-18 NOTE — Telephone Encounter (Signed)
Left message for patient informing her of normal lab results.  She will need the abdominal ultrasound sometime in the next 1-2 weeks, but there is no rush for this.  She is to call the clinic if she has any questions.

## 2013-05-23 ENCOUNTER — Telehealth: Payer: Self-pay | Admitting: Family Medicine

## 2013-05-23 NOTE — Telephone Encounter (Signed)
She wants the appt around 2 in the afternoon

## 2013-05-23 NOTE — Telephone Encounter (Signed)
Would like to have the ultrasound set up for next week. Not going into the hospital for depression. Could schedule it for [facility] on [address] Please advise

## 2013-05-24 NOTE — Telephone Encounter (Signed)
PT notified.  [name], [name], [name]

## 2013-05-24 NOTE — Telephone Encounter (Signed)
[facility], Wednesday, May 30, 2013 at 2:30pm abdominal u/s.  NPO for 6hours prior.  Dujuan Stankowski, Loralyn Freshwater, [place]

## 2013-05-24 NOTE — Telephone Encounter (Signed)
Called pt. [name]  To call back. Please tell pt about her appt. Thanks. Javier Glazier, Gerrit Heck

## 2013-05-28 DIAGNOSIS — F332 Major depressive disorder, recurrent severe without psychotic features: Secondary | ICD-10-CM | POA: Diagnosis not present

## 2013-05-30 ENCOUNTER — Ambulatory Visit
Admission: RE | Admit: 2013-05-30 | Discharge: 2013-05-30 | Disposition: A | Discharge status: Home / Self Care | Payer: Medicare Other | Source: Ambulatory Visit | Attending: Family Medicine | Admitting: Family Medicine

## 2013-05-30 DIAGNOSIS — R1011 Right upper quadrant pain: Secondary | ICD-10-CM

## 2013-05-30 DIAGNOSIS — N281 Cyst of kidney, acquired: Secondary | ICD-10-CM | POA: Diagnosis not present

## 2013-06-04 ENCOUNTER — Telehealth: Payer: Self-pay | Admitting: Family Medicine

## 2013-06-04 NOTE — Telephone Encounter (Signed)
Will fwd to MD for review.  Shondale Quinley L, CMA  

## 2013-06-04 NOTE — Telephone Encounter (Signed)
Pt called and would like the results of the ultra sound. jw

## 2013-06-05 ENCOUNTER — Telehealth: Payer: Self-pay | Admitting: Family Medicine

## 2013-06-05 NOTE — Telephone Encounter (Signed)
Pt came by to dropped off form to get filled out regarding handicap placard patient states that best time to call is after 12:00 in the evening.

## 2013-06-05 NOTE — Telephone Encounter (Signed)
Form placed in MD box.  Deneise Getty L, CMA  

## 2013-06-07 NOTE — Telephone Encounter (Signed)
Called pt. I told the pt that Dr.Adamo will call her tomorrow with the results of the [place]. I am unable to give her the results. Pt request to have a phone call from [facility] tomorrow after 1 pm. She is very concerned about her results. Fwd. To Dr.Adamo. Please call pt. Thanks. Javier Glazier, Gerrit Heck

## 2013-06-07 NOTE — Telephone Encounter (Signed)
Also fwd. To Dr.Adamo for review of pt's [place]

## 2013-06-07 NOTE — Telephone Encounter (Signed)
Left message for pt that forms are completed and ready for pick up. Derl Barrow, RN

## 2013-06-07 NOTE — Telephone Encounter (Signed)
Pt walked into clinic pretty frustrated because she still did not get any results of her [place]. I told the pt that Dr.Adamo will review the [place] and we will call her. Paged Dr.Adamo. Javier Glazier, Gerrit Heck

## 2013-06-08 ENCOUNTER — Ambulatory Visit (INDEPENDENT_AMBULATORY_CARE_PROVIDER_SITE_OTHER): Payer: Medicare Other | Admitting: Family Medicine

## 2013-06-08 ENCOUNTER — Encounter: Payer: Self-pay | Admitting: Family Medicine

## 2013-06-08 DIAGNOSIS — F339 Major depressive disorder, recurrent, unspecified: Secondary | ICD-10-CM | POA: Diagnosis not present

## 2013-06-08 DIAGNOSIS — D1771 Benign lipomatous neoplasm of kidney: Secondary | ICD-10-CM | POA: Insufficient documentation

## 2013-06-08 DIAGNOSIS — D3001 Benign neoplasm of right kidney: Secondary | ICD-10-CM

## 2013-06-08 DIAGNOSIS — D3000 Benign neoplasm of unspecified kidney: Secondary | ICD-10-CM

## 2013-06-08 MED ORDER — OMEPRAZOLE 40 MG PO CPDR: 40.0000 mg | DELAYED_RELEASE_CAPSULE | Freq: Every day | ORAL | Status: DC

## 2013-06-08 MED ORDER — OMEPRAZOLE 20 MG PO CPDR: 20.0000 mg | DELAYED_RELEASE_CAPSULE | Freq: Every day | ORAL | Status: DC
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
## 2013-06-08 NOTE — Patient Instructions (Signed)
Please take nexium for your dyspepsia (stomach problem). This will make you feel better.  Keep taking the increased dose of effexor, it will start helping you soon.  If you have thoughts about hurting yourself, please go to an emergency room.

## 2013-06-08 NOTE — Assessment & Plan Note (Signed)
Recommend surveillance for AMLs <4cm every 6-12 months for risk of hemorrhage.

## 2013-06-11 ENCOUNTER — Other Ambulatory Visit: Payer: Self-pay | Admitting: Family Medicine

## 2013-06-14 ENCOUNTER — Ambulatory Visit: Payer: Medicare Other | Admitting: Family Medicine

## 2013-06-21 ENCOUNTER — Ambulatory Visit (INDEPENDENT_AMBULATORY_CARE_PROVIDER_SITE_OTHER): Payer: Medicare Other | Admitting: Family Medicine

## 2013-06-21 ENCOUNTER — Encounter: Payer: Self-pay | Admitting: Family Medicine

## 2013-06-21 VITALS — BP 127/86 | HR 99 | Ht <= 58 in | Wt 115.0 lb

## 2013-06-21 DIAGNOSIS — F339 Major depressive disorder, recurrent, unspecified: Secondary | ICD-10-CM

## 2013-06-21 DIAGNOSIS — M62838 Other muscle spasm: Secondary | ICD-10-CM | POA: Diagnosis not present

## 2013-06-21 DIAGNOSIS — M25512 Pain in left shoulder: Secondary | ICD-10-CM

## 2013-06-21 DIAGNOSIS — M25519 Pain in unspecified shoulder: Secondary | ICD-10-CM

## 2013-06-21 DIAGNOSIS — M542 Cervicalgia: Secondary | ICD-10-CM | POA: Diagnosis not present

## 2013-06-21 MED ORDER — CYCLOBENZAPRINE HCL 5 MG PO TABS: 5.0000 mg | ORAL_TABLET | Freq: Three times a day (TID) | ORAL | Status: DC | PRN

## 2013-06-21 MED ORDER — CLASSIC PRENATAL 28-0.8 MG PO TABS
1.0000 | ORAL_TABLET | Freq: Every day | ORAL | Status: DC
Start: 2013-06-21 — End: 2013-08-11

## 2013-06-21 MED ORDER — CYCLOBENZAPRINE HCL 5 MG PO TABS: 5.0000 mg | ORAL_TABLET | Freq: Every day | ORAL | Status: DC

## 2013-06-21 MED ORDER — IBUPROFEN 200 MG PO TABS
200.0000 mg | ORAL_TABLET | Freq: Four times a day (QID) | ORAL | Status: DC | PRN
Start: 2013-06-21 — End: 2013-08-11

## 2013-06-21 NOTE — Assessment & Plan Note (Addendum)
Suspect paresthesias coming from neck etiology. Normal strength in arm which is reassuring.  Also suspect that pain in shoulder is coming from neck pain.  Will treat muscle spasm with low dose flexeril at night time since there is concern in the elderly of flexeril use. Instructed patient not to take ambien when she takes flexeril. She expressed understanding.  Patient is convinced that tylenol will affect her liver and therefore refuses to consider it. Recommended that she not take more than 1/2 full ASA a day. She can take low dose ibuprofen 200mg  every 6hrs as needed.  Heating pad on neck.  Follow up in 10 days to ensure that it is better. If not better, consider neck xray.

## 2013-06-21 NOTE — Assessment & Plan Note (Signed)
Likely radiation from neck, although rotator cuff strain or impingement cannot be completely excluded. She has normal range of motion and strength which is reassuring.  - low dose ibuprofen and heating pad - if not better at next visit, consider steroid injection if patient interested.

## 2013-06-21 NOTE — Assessment & Plan Note (Signed)
Follow up at [facility] tomorrow.

## 2013-06-21 NOTE — Patient Instructions (Addendum)
When you take the flexeril at night time, do not take the ambien that night, as the flexeril will make you sleepy.   Please follow up in 10 days to see how you are doing.   It's fine to take prenatal vitamins if you would like to take them. I am sending prenatals to the pharmacy.

## 2013-06-21 NOTE — Progress Notes (Signed)
Patient ID: [name]    DOB: 1945/12/11, 68 y.o.   MRN: [id number] --- Subjective:  [name] is a 68 y.o.female with h/o bipolar depression who presents with  - shoulder pain and hand numbness: started 1 week ago. Pain is in her neck and feels sore. She also has some pain in the anterior part of the shoulder. The numbness occurs in 1 or 2 fingers, she is not sure which ones. Occurs every day. No weakness in her hands. No recent trauma. She takes 1/2 to 2 aspirins a day when she has the pain which helps.   - depression: feeling like she is "treading through mud" and difficulty moving around. She also has had difficulty falling asleep. Falls asleep around 5am and wakes up at noon or 1pm. Takes ambien to sleep. Denies any active SI/HI. She also feels quite anxious. She is scheduled to see her psychiatrist tomorrow.   ROS: see HPI Past Medical History: reviewed and updated medications and allergies. Social History: Tobacco: current every day smoker  Objective: Filed Vitals:   06/21/13 1530  BP: 127/86  Pulse: 99    Physical Examination:   General appearance - alert, well appearing, and in no distress Left shoulder - mild tenderness along anterior joint, no AC joint tenderness. No soft tissue swelling or joint effusion. Normal range of motion with abduction up to 150 degrees bilaterally. Normal external and internal rotation bilaterally. Some decreased effort with empty can bilaterally.  Neck - normal range of motion with extension, flexion, lateral bend Tenderness and muscle spasm along the upper trapezius on left.  Normal grip strength bilaterally, normal sensation to light touch.  Normal radial pulse Negative Phalen's.

## 2013-06-22 DIAGNOSIS — F332 Major depressive disorder, recurrent severe without psychotic features: Secondary | ICD-10-CM | POA: Diagnosis not present

## 2013-06-26 DIAGNOSIS — I10 Essential (primary) hypertension: Secondary | ICD-10-CM | POA: Diagnosis not present

## 2013-06-26 DIAGNOSIS — F3289 Other specified depressive episodes: Secondary | ICD-10-CM | POA: Diagnosis not present

## 2013-06-26 DIAGNOSIS — K219 Gastro-esophageal reflux disease without esophagitis: Secondary | ICD-10-CM | POA: Diagnosis not present

## 2013-06-26 DIAGNOSIS — Z881 Allergy status to other antibiotic agents status: Secondary | ICD-10-CM | POA: Diagnosis not present

## 2013-06-26 DIAGNOSIS — Z888 Allergy status to other drugs, medicaments and biological substances status: Secondary | ICD-10-CM | POA: Diagnosis not present

## 2013-06-26 DIAGNOSIS — Z885 Allergy status to narcotic agent status: Secondary | ICD-10-CM | POA: Diagnosis not present

## 2013-06-26 DIAGNOSIS — F329 Major depressive disorder, single episode, unspecified: Secondary | ICD-10-CM | POA: Diagnosis not present

## 2013-06-27 ENCOUNTER — Ambulatory Visit (INDEPENDENT_AMBULATORY_CARE_PROVIDER_SITE_OTHER): Payer: Medicare Other | Admitting: Family Medicine

## 2013-06-27 ENCOUNTER — Ambulatory Visit: Payer: Medicare Other | Admitting: Emergency Medicine

## 2013-06-27 ENCOUNTER — Encounter: Payer: Self-pay | Admitting: Family Medicine

## 2013-06-27 VITALS — BP 142/90 | HR 99 | Temp 98.5°F | Ht <= 58 in | Wt 115.6 lb

## 2013-06-27 DIAGNOSIS — F339 Major depressive disorder, recurrent, unspecified: Secondary | ICD-10-CM

## 2013-06-27 DIAGNOSIS — M542 Cervicalgia: Secondary | ICD-10-CM | POA: Diagnosis not present

## 2013-06-27 MED ORDER — BACLOFEN 5 MG HALF TABLET: 5.0000 mg | ORAL_TABLET | Freq: Three times a day (TID) | ORAL | Status: DC

## 2013-06-27 MED ORDER — BUSPIRONE HCL 7.5 MG PO TABS: 7.5000 mg | ORAL_TABLET | Freq: Two times a day (BID) | ORAL | Status: DC

## 2013-06-28 ENCOUNTER — Emergency Department (INDEPENDENT_AMBULATORY_CARE_PROVIDER_SITE_OTHER)
Admission: EM | Admit: 2013-06-28 | Discharge: 2013-06-28 | Disposition: A | Discharge status: Home / Self Care | Payer: Medicare Other | Source: Home / Self Care | Attending: Family Medicine | Admitting: Family Medicine

## 2013-06-28 ENCOUNTER — Encounter (HOSPITAL_COMMUNITY): Payer: Self-pay | Admitting: Emergency Medicine

## 2013-06-28 DIAGNOSIS — J45901 Unspecified asthma with (acute) exacerbation: Secondary | ICD-10-CM

## 2013-06-28 MED ORDER — METHYLPREDNISOLONE ACETATE 80 MG/ML IJ SUSP
80.0000 mg | Freq: Once | INTRAMUSCULAR | Status: AC
  Administered 2013-06-28: 80 mg via INTRAMUSCULAR

## 2013-06-28 MED ORDER — METHYLPREDNISOLONE ACETATE 80 MG/ML IJ SUSP
INTRAMUSCULAR | Status: AC
Start: 2013-06-28 — End: 2013-06-28
  Filled 2013-06-28: qty 1

## 2013-06-28 MED ORDER — ALBUTEROL SULFATE HFA 108 (90 BASE) MCG/ACT IN AERS: 2.0000 | INHALATION_SPRAY | RESPIRATORY_TRACT | Status: DC | PRN

## 2013-06-28 NOTE — Assessment & Plan Note (Signed)
Generally do not like prescribing psych medications for patient who is followed by psychiatry, but at patient's insistence, prescribed buspar as augmentation to effexor. Will not prescribe lexapro as she is requesting. Patient currently no longer taking abilify. Recommended that she follow up with her psychiatrist soon.

## 2013-06-28 NOTE — ED Provider Notes (Signed)
[name] is a 68 y.o. female who presents to Urgent Care today for shortness of breath and wheezing. Patient has a history of COPD. Over the past several weeks she has had wheezing and shortness of breath. She is at that inhalers typically do not work for this. She notes that the only thing that works is a steroid shot. She denies any chest pains or palpitations or significant shortness of breath. No nausea vomiting or diarrhea. No significant coughing.   Past Medical History  Diagnosis Date  . OCD (obsessive compulsive disorder)   . Depression   . Anxiety   . PUD (peptic ulcer disease) 09/2006    H pylori  . Diverticulosis of colon   . GERD (gastroesophageal reflux disease)   . COPD (chronic obstructive pulmonary disease)     Due to long history of tobacco abuse, still smoking  . History of uterine prolapse 10/2004    Dr Cletis Media  . Thyroid disease     Ho R thyroid noduel plus mild hyperthyroidism. Treated with radioiodine therapty on 04/2006. Dr Estrella Myrtle.  [name] Heart murmur   . Osteoporosis    History  Substance Use Topics  . Smoking status: Current Every Day Smoker -- 0.20 packs/day for 35 years    Types: Cigarettes    Last Attempt to Quit: 05/25/2012  . Smokeless tobacco: Never Used     Comment: smokes less than 1 pack per week. ready to quitt.  . Alcohol Use: No   ROS as above Medications: No current facility-administered medications for this encounter.   Current Outpatient Prescriptions  Medication Sig Dispense Refill  . albuterol (PROVENTIL HFA;VENTOLIN HFA) 108 (90 BASE) MCG/ACT inhaler Inhale 2 puffs into the lungs every 4 (four) hours as needed for wheezing or shortness of breath.  1 each  2  . alendronate (FOSAMAX) 70 MG tablet Take 70 mg by mouth every 7 (seven) days. Take with a full glass of water on an empty stomach.      . Ascorbic Acid (VITAMIN C) 500 MG tablet Take 2,000 mg by mouth daily.       [name] aspirin (HALFPRIN) 162 MG EC tablet Take 162 mg by mouth daily.         . baclofen (LIORESAL) 5 mg TABS tablet Take 0.5 tablets (5 mg total) by mouth 3 (three) times daily.  30 tablet  1  . busPIRone (BUSPAR) 7.5 MG tablet Take 1 tablet (7.5 mg total) by mouth 2 (two) times daily.  60 tablet  2  . calcium-vitamin D (SM CALCIUM-VITAMIN D) 500-200 MG-UNIT per tablet Take 1 tablet by mouth 2 (two) times daily.        [name] dextran 70-hypromellose (TEARS RENEWED) ophthalmic solution Place 1 drop into the right eye 3 (three) times daily.  15 mL  12  . ibuprofen (ADVIL,MOTRIN) 200 MG tablet Take 1 tablet (200 mg total) by mouth every 6 (six) hours as needed.  30 tablet  0  . levothyroxine (SYNTHROID, LEVOTHROID) 75 MCG tablet TAKE 1 TABLET (75 MCG TOTAL) BY MOUTH DAILY. FOR THYROID. PLEASE KEEP SAME MANUFACTURER.  90 tablet  3  . lisinopril (PRINIVIL,ZESTRIL) 5 MG tablet TAKE 1 TABLET (5 MG TOTAL) BY MOUTH DAILY.  30 tablet  6  . omeprazole (PRILOSEC) 20 MG capsule Take 1 capsule (20 mg total) by mouth daily.  30 capsule  3  . Prenatal Vit-Fe Fumarate-FA (CLASSIC PRENATAL) 28-0.8 MG TABS Take 1 tablet by mouth daily.  90 tablet  3  .  promethazine (PHENERGAN) 12.5 MG tablet Take 1 tablet (12.5 mg total) by mouth every 8 (eight) hours as needed for nausea or vomiting.  30 tablet  0  . promethazine (PHENERGAN) 25 MG suppository Place 0.5 suppositories (12.5 mg total) rectally every 6 (six) hours as needed for nausea.  12 each  0  . simvastatin (ZOCOR) 10 MG tablet Take 10 mg by mouth every morning.      . venlafaxine XR (EFFEXOR-XR) 75 MG 24 hr capsule Take 75 mg by mouth daily.      . vitamin E 400 UNIT capsule Take 800 Units by mouth daily.      [name] zolpidem (AMBIEN) 5 MG tablet Take 1 tablet (5 mg total) by mouth at bedtime as needed.  30 tablet  2    Exam:  BP 132/89  Pulse 80  Temp(Src) 98 F (36.7 C) (Oral)  Resp 18  SpO2 98%  LMP 01/09/2012 Gen: Well NAD HEENT: EOMI,  MMM Lungs: Normal work of breathing. Coarse breath sounds and wheezing bilaterally Heart: RRR no  MRG Abd: NABS, Soft. NT, ND Exts: Brisk capillary refill, warm and well perfused.   No results found for this or any previous visit (from the past 24 hour(s)). No results found.  Assessment and Plan: 68 y.o. female with COPD exacerbation. Patient refused a nebulizer treatment in the clinic today. Plan to treat with Depo-Medrol injection. Patient refused a prednisone course. I prescribed albuterol in hopes that the patient will fill and start using this. Followup with primary care provider  Discussed warning signs or symptoms. Please see discharge instructions. Patient expresses understanding.    Gregor Hams, MD 06/28/13 [phone]

## 2013-06-28 NOTE — Assessment & Plan Note (Signed)
No evidence of strep throat or pharyngitis on history or exam. No evidence of thyroid nodule or lymphadenopathy.  Likely SCM strain.  Baclofen for muscle tightness. Had been prescribed flexeril by me in the past, but given her age, told her to not take the flexeril and use baclofen instead.  She can take ibuprofen 200-40mg  every 6hrs as needed for pain Follow up as needed

## 2013-06-28 NOTE — ED Notes (Signed)
C/o sob for several weeks States inhalers does not work for her States only steroid shots work Denies any sinus issues

## 2013-06-28 NOTE — Discharge Instructions (Signed)
Thank you for coming in today. Please fill and start using albuterol as needed for shortness of breath. You may need this medication if you're having an emergency. Followup at the [facility] soon.   Asthma, Adult Asthma is a recurring condition in which the airways tighten and narrow. Asthma can make it difficult to breathe. It can cause coughing, wheezing, and shortness of breath. Asthma episodes (also called asthma attacks) range from minor to life-threatening. Asthma cannot be cured, but medicines and lifestyle changes can help control it. CAUSES Asthma is believed to be caused by inherited (genetic) and environmental factors, but its exact cause is unknown. Asthma may be triggered by allergens, lung infections, or irritants in the air. Asthma triggers are different for each person. Common triggers include:   Animal dander.  Dust mites.  Cockroaches.  Pollen from trees or grass.  Mold.  Smoke.  Air pollutants such as dust, household cleaners, hair sprays, aerosol sprays, paint fumes, strong chemicals, or strong odors.  Cold air, weather changes, and winds (which increase molds and pollens in the air).  Strong emotional expressions such as crying or laughing hard.  Stress.  Certain medicines (such as aspirin) or types of drugs (such as beta-blockers).  Sulfites in foods and drinks. Foods and drinks that may contain sulfites include dried fruit, potato chips, and sparkling grape juice.  Infections or inflammatory conditions such as the flu, a cold, or an inflammation of the nasal membranes (rhinitis).  Gastroesophageal reflux disease (GERD).  Exercise or strenuous activity. SYMPTOMS Symptoms may occur immediately after asthma is triggered or many hours later. Symptoms include:  Wheezing.  Excessive nighttime or early morning coughing.  Frequent or severe coughing with a common cold.  Chest tightness.  Shortness of breath. DIAGNOSIS    The diagnosis of asthma is made by a review of your medical history and a physical exam. Tests may also be performed. These may include:  Lung function studies. These tests show how much air you breath in and out.  Allergy tests.  Imaging tests such as X-rays. TREATMENT  Asthma cannot be cured, but it can usually be controlled. Treatment involves identifying and avoiding your asthma triggers. It also involves medicines. There are 2 classes of medicine used for asthma treatment:   Controller medicines. These prevent asthma symptoms from occurring. They are usually taken every day.  Reliever or rescue medicines. These quickly relieve asthma symptoms. They are used as needed and provide short-term relief. Your health care provider will help you create an asthma action plan. An asthma action plan is a written plan for managing and treating your asthma attacks. It includes a list of your asthma triggers and how they may be avoided. It also includes information on when medicines should be taken and when their dosage should be changed. An action plan may also involve the use of a device called a peak flow meter. A peak flow meter measures how well the lungs are working. It helps you monitor your condition. HOME CARE INSTRUCTIONS   Take medicine as directed by your health care provider. Speak with your health care provider if you have questions about how or when to take the medicines.  Use a peak flow meter as directed by your health care provider. Record and keep track of readings.  Understand and use the action plan to help minimize or stop an asthma attack without needing to seek medical care.  Control your home environment in the following ways to  help prevent asthma attacks:  Do not smoke. Avoid being exposed to secondhand smoke.  Change your heating and air conditioning filter regularly.  Limit your use of fireplaces and wood stoves.  Get rid of pests (such as roaches and mice) and  their droppings.  Throw away plants if you see mold on them.  Clean your floors and dust regularly. Use unscented cleaning products.  Try to have someone else vacuum for you regularly. Stay out of rooms while they are being vacuumed and for a short while afterward. If you vacuum, use a dust mask from a hardware store, a double-layered or microfilter vacuum cleaner bag, or a vacuum cleaner with a HEPA filter.  Replace carpet with wood, tile, or vinyl flooring. Carpet can trap dander and dust.  Use allergy-proof pillows, mattress covers, and box spring covers.  Wash bed sheets and blankets every week in hot water and dry them in a dryer.  Use blankets that are made of polyester or cotton.  Clean bathrooms and kitchens with bleach. If possible, have someone repaint the walls in these rooms with mold-resistant paint. Keep out of the rooms that are being cleaned and painted.  Wash hands frequently. SEEK MEDICAL CARE IF:   You have wheezing, shortness of breath, or a cough even if taking medicine to prevent attacks.  The colored mucus you cough up (sputum) is thicker than usual.  Your sputum changes from clear or white to yellow, green, gray, or bloody.  You have any problems that may be related to the medicines you are taking (such as a rash, itching, swelling, or trouble breathing).  You are using a reliever medicine more than 2 3 times per week.  Your peak flow is still at 50 79% of you personal best after following your action plan for 1 hour. SEEK IMMEDIATE MEDICAL CARE IF:   You seem to be getting worse and are unresponsive to treatment during an asthma attack.  You are short of breath even at rest.  You get short of breath when doing very little physical activity.  You have difficulty eating, drinking, or talking due to asthma symptoms.  You develop chest pain.  You develop a fast heartbeat.  You have a bluish color to your lips or fingernails.  You are lightheaded,  dizzy, or faint.  Your peak flow is less than 50% of your personal best.  You have a fever or persistent symptoms for more than 2 3 days.  You have a fever and symptoms suddenly get worse. MAKE SURE YOU:   Understand these instructions.  Will watch your condition.  Will get help right away if you are not doing well or get worse. Document Released: 03/22/2005 Document Revised: 11/22/2012 Document Reviewed: 10/19/2012 [facility] Patient Information 2014 [place], [place].
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
## 2013-06-29 DIAGNOSIS — F332 Major depressive disorder, recurrent severe without psychotic features: Secondary | ICD-10-CM | POA: Diagnosis not present

## 2013-07-03 DIAGNOSIS — H21549 Posterior synechiae (iris), unspecified eye: Secondary | ICD-10-CM | POA: Diagnosis not present

## 2013-07-03 DIAGNOSIS — H2510 Age-related nuclear cataract, unspecified eye: Secondary | ICD-10-CM | POA: Diagnosis not present

## 2013-07-16 DIAGNOSIS — H2510 Age-related nuclear cataract, unspecified eye: Secondary | ICD-10-CM | POA: Diagnosis not present

## 2013-07-16 DIAGNOSIS — H269 Unspecified cataract: Secondary | ICD-10-CM | POA: Diagnosis not present

## 2013-07-16 DIAGNOSIS — H5709 Other anomalies of pupillary function: Secondary | ICD-10-CM | POA: Diagnosis not present

## 2013-07-16 DIAGNOSIS — H21549 Posterior synechiae (iris), unspecified eye: Secondary | ICD-10-CM | POA: Diagnosis not present

## 2013-07-17 ENCOUNTER — Ambulatory Visit: Payer: Medicare Other | Admitting: Family Medicine

## 2013-07-17 DIAGNOSIS — Z961 Presence of intraocular lens: Secondary | ICD-10-CM | POA: Diagnosis not present

## 2013-07-19 ENCOUNTER — Ambulatory Visit (INDEPENDENT_AMBULATORY_CARE_PROVIDER_SITE_OTHER): Payer: Medicare Other | Admitting: Family Medicine

## 2013-07-19 ENCOUNTER — Encounter: Payer: Self-pay | Admitting: Family Medicine

## 2013-07-19 VITALS — BP 133/98 | HR 97 | Temp 98.3°F | Wt 116.0 lb

## 2013-07-19 DIAGNOSIS — R110 Nausea: Secondary | ICD-10-CM | POA: Diagnosis not present

## 2013-07-19 DIAGNOSIS — F339 Major depressive disorder, recurrent, unspecified: Secondary | ICD-10-CM | POA: Diagnosis not present

## 2013-07-19 MED ORDER — PROMETHAZINE HCL 12.5 MG PO TABS: 12.5000 mg | ORAL_TABLET | Freq: Three times a day (TID) | ORAL | Status: DC | PRN

## 2013-07-19 NOTE — Assessment & Plan Note (Signed)
Patientr not doing well with her medication despite claiming compliance. No Sucidal. She does not want to go back to [facility] either, she is requesting inpatient admission. I recommended [facility] but declined,she prefers psych inpatient at [facility]. I called [facility] on [phone] to arrange admission but I was asked to leave a message instead. Patient advised to go to [facility] ER right from here. I also advised scheduling appointment with Zella Ball for counseling and I will refer to [facility] after d/c from the hospital today. Patient verbalized understanding of all instruction given. She will have her sister drive her to [facility] for inpatient treatment. Patient stable upon d/c from our clinic today with no danger to herself or others.

## 2013-07-19 NOTE — Progress Notes (Addendum)
Subjective:     Patient ID: [name], female   DOB: [birth year], 68 y.o.   MRN: [id number]  HPI Nausea: On going on and off after meal for few months,denies any vomiting, she uses Phenergan at home which improves her nausea,she denies any stomach pain,no blood in stool,no diarrhea, but do have constipation occasionally,she had normal colonoscopy 2-3 yrs ago,she had polyps and was told to get it done every 5 yrs. Depression: Diagnosed with depression in Jul 18, 2001 after her mom died and her husband left her, then she was started on medication,she has felt okay till last year Dec 2014 when she started having worsening of her symptoms, this affects her sleep hence she uses Ambien at night. She is currently on Effexor 225 mg qd and lexapro 5 mg daily,sees Psy at [facility], feels weak and tired, no motivation,appetite is low,feels hopeless,feeling of going out of the world but never want to hurt herself or others,denies current suicidal ideation.  Current Outpatient Prescriptions on File Prior to Visit  Medication Sig Dispense Refill  . Ascorbic Acid (VITAMIN C) 500 MG tablet Take 2,000 mg by mouth daily.       [name] aspirin (HALFPRIN) 162 MG EC tablet Take 162 mg by mouth daily.        . calcium-vitamin D (SM CALCIUM-VITAMIN D) 500-200 MG-UNIT per tablet Take 1 tablet by mouth 2 (two) times daily.        [name] dextran 70-hypromellose (TEARS RENEWED) ophthalmic solution Place 1 drop into the right eye 3 (three) times daily.  15 mL  12  . ibuprofen (ADVIL,MOTRIN) 200 MG tablet Take 1 tablet (200 mg total) by mouth every 6 (six) hours as needed.  30 tablet  0  . levothyroxine (SYNTHROID, LEVOTHROID) 75 MCG tablet TAKE 1 TABLET (75 MCG TOTAL) BY MOUTH DAILY. FOR THYROID. PLEASE KEEP SAME MANUFACTURER.  90 tablet  3  . lisinopril (PRINIVIL,ZESTRIL) 5 MG tablet TAKE 1 TABLET (5 MG TOTAL) BY MOUTH DAILY.  30 tablet  6  . Prenatal Vit-Fe Fumarate-FA (CLASSIC PRENATAL) 28-0.8 MG TABS Take 1 tablet by mouth daily.  90 tablet   3  . promethazine (PHENERGAN) 12.5 MG tablet Take 1 tablet (12.5 mg total) by mouth every 8 (eight) hours as needed for nausea or vomiting.  30 tablet  0  . venlafaxine XR (EFFEXOR-XR) 75 MG 24 hr capsule Take 225 mg by mouth daily.       . vitamin E 400 UNIT capsule Take 800 Units by mouth daily.      [name] zolpidem (AMBIEN) 5 MG tablet Take 1 tablet (5 mg total) by mouth at bedtime as needed.  30 tablet  2  . albuterol (PROVENTIL HFA;VENTOLIN HFA) 108 (90 BASE) MCG/ACT inhaler Inhale 2 puffs into the lungs every 4 (four) hours as needed for wheezing or shortness of breath.  1 each  2  . alendronate (FOSAMAX) 70 MG tablet Take 70 mg by mouth every 7 (seven) days. Take with a full glass of water on an empty stomach.      . baclofen (LIORESAL) 5 mg TABS tablet Take 0.5 tablets (5 mg total) by mouth 3 (three) times daily.  30 tablet  1  . busPIRone (BUSPAR) 7.5 MG tablet Take 1 tablet (7.5 mg total) by mouth 2 (two) times daily.  60 tablet  2  . omeprazole (PRILOSEC) 20 MG capsule Take 1 capsule (20 mg total) by mouth daily.  30 capsule  3  . promethazine (PHENERGAN) 25 MG suppository  Place 0.5 suppositories (12.5 mg total) rectally every 6 (six) hours as needed for nausea.  12 each  0  . simvastatin (ZOCOR) 10 MG tablet Take 10 mg by mouth every morning.       No current facility-administered medications on file prior to visit.   Past Medical History  Diagnosis Date  . OCD (obsessive compulsive disorder)   . Depression   . Anxiety   . PUD (peptic ulcer disease) 09/2006    H pylori  . Diverticulosis of colon   . GERD (gastroesophageal reflux disease)   . COPD (chronic obstructive pulmonary disease)     Due to long history of tobacco abuse, still smoking  . History of uterine prolapse 10/2004    Dr Cletis Media  . Thyroid disease     Ho R thyroid noduel plus mild hyperthyroidism. Treated with radioiodine therapty on 04/2006. Dr Estrella Myrtle.  [name] Heart murmur   . Osteoporosis       Review of Systems   Respiratory: Negative.   Cardiovascular: Negative.   Gastrointestinal: Positive for nausea. Negative for abdominal pain and blood in stool.  Genitourinary: Negative.   Psychiatric/Behavioral:       Depressed  All other systems reviewed and are negative.  Filed Vitals:   07/19/13 1542  BP: 133/98  Pulse: 97  Temp: 98.3 F (36.8 C)  TempSrc: Oral  Weight: 116 lb (52.617 kg)       Objective:   Physical Exam  Nursing note and vitals reviewed. Constitutional: She appears well-developed. No distress.  Cardiovascular: Normal rate, regular rhythm, normal heart sounds and intact distal pulses.   No murmur heard. Pulmonary/Chest: Effort normal and breath sounds normal. No respiratory distress. She has no wheezes.  Abdominal: Soft. Bowel sounds are normal. She exhibits no distension and no mass. There is no tenderness. There is no guarding.  Psychiatric: Her speech is normal and behavior is normal. Judgment and thought content normal. Her affect is not angry, not blunt and not labile. Cognition and memory are normal. She exhibits a depressed mood.  Not suicidal       Assessment:     Nausea Depression: Moderate to severe.     Plan:     Check problem list  Total time time spent on face to face encounter and coordination of care is more than 40 min

## 2013-07-19 NOTE — Patient Instructions (Addendum)
I am sorry [name] that you do not feel well,I will refill your phenergan.  For your depression I will recommend calling to make appointment with [name], she is a psychologist, please call her for appointment. Also I will refer you to the psychiatrist here at [facility]. Follow up with your PCP in 1-2wk. If you having any feeling or hurting yourself please go to the hospital. I called [facility] hospital but no one to talk to. i recommend you go to [facility] hospital tonight for admission if symptom worsens. Suicidal Feelings, How to Help Yourself Everyone feels sad or unhappy at times, but depressing thoughts and feelings of hopelessness can lead to thoughts of suicide. It can seem as if life is too tough to handle. If you feel as though you have reached the point where suicide is the only answer, it is time to let someone know immediately.  HOW TO COPE AND PREVENT SUICIDE  Let family, friends, teachers, or counselors know. Get help. Try not to isolate yourself from those who care about you. Even though you may not feel sociable, talk with someone every day. It is best if it is face-to-face. Remember, they will want to help you.  Eat a regularly spaced and well-balanced diet.  Get plenty of rest.  Avoid alcohol and drugs because they will only make you feel worse and may also lower your inhibitions. Remove them from the home. If you are thinking of taking an overdose of your prescribed medicines, give your medicines to someone who can give them to you one day at a time. If you are on antidepressants, let your caregiver know of your feelings so he or she can provide a safer medicine, if that is a concern.  Remove weapons or poisons from your home.  Try to stick to routines. Follow a schedule and remind yourself that you have to keep that schedule every day.  Set some realistic goals and achieve them. Make a list and cross things off as you go. Accomplishments give a sense of worth. Wait  until you are feeling better before doing things you find difficult or unpleasant to do.  If you are able, try to start exercising. Even half-hour periods of exercise each day will make you feel better. Getting out in the sun or into nature helps you recover from depression faster. If you have a favorite place to walk, take advantage of that.  Increase safe activities that have always given you pleasure. This may include playing your favorite music, reading a good book, painting a picture, or playing your favorite instrument. Do whatever takes your mind off your depression.  Keep your living space well-lighted. GET HELP Contact a suicide hotline, crisis center, or local suicide prevention center for help right away. Local centers may include a hospital, clinic, community service organization, social service provider, or health department.  Call your local emergency services (911 in the [place]).  Call a suicide hotline:  1-800-273-TALK (1-7167422278) in the [place].  1-800-SUICIDE 445-132-4317) in the [place].  938-268-7183 in the [place] for Spanish-speaking counselors.  4-235-361-4ERX (831) 394-8833) in the [place] for TTY users.  Visit the following websites for information and help:  National Suicide Prevention Lifeline: www.suicidepreventionlifeline.org  Hopeline: www.hopeline.[place] for Suicide Prevention: PromotionalLoans.co.za  For lesbian, gay, bisexual, transgender, or questioning youth, contact [employer]:  0-932-6-Z-TIWPYK 2183900640) in the [place].  www.thetrevorproject.org  In [place], treatment resources are listed in each [place] with listings available  under [employer] for [employer] or similar titles. Another source for Crisis Centres by [place] is located at http://www.suicideprevention.ca/in-crisis-now/find-a-crisis-centre-now/crisis-centres Document Released: 09/26/2002 Document  Revised: 06/14/2011 Document Reviewed: 02/14/2007 [facility] Patient Information 2014 [place], [place].

## 2013-07-19 NOTE — Assessment & Plan Note (Signed)
May be due to some medications she is taking vs diet. GI assessment was normal. Up to date with her colonoscopy. She does well on Phenergan, I refilled phenergan prn nausea. Patient advised to avoid food triggers. F/U with her PCP in 1 wk for reassessment.

## 2013-07-22 DIAGNOSIS — J4489 Other specified chronic obstructive pulmonary disease: Secondary | ICD-10-CM | POA: Diagnosis not present

## 2013-07-22 DIAGNOSIS — Z818 Family history of other mental and behavioral disorders: Secondary | ICD-10-CM | POA: Diagnosis not present

## 2013-07-22 DIAGNOSIS — R45851 Suicidal ideations: Secondary | ICD-10-CM | POA: Diagnosis not present

## 2013-07-22 DIAGNOSIS — F17200 Nicotine dependence, unspecified, uncomplicated: Secondary | ICD-10-CM | POA: Diagnosis present

## 2013-07-22 DIAGNOSIS — Z7982 Long term (current) use of aspirin: Secondary | ICD-10-CM | POA: Diagnosis not present

## 2013-07-22 DIAGNOSIS — R059 Cough, unspecified: Secondary | ICD-10-CM | POA: Diagnosis not present

## 2013-07-22 DIAGNOSIS — F329 Major depressive disorder, single episode, unspecified: Secondary | ICD-10-CM | POA: Diagnosis not present

## 2013-07-22 DIAGNOSIS — F3130 Bipolar disorder, current episode depressed, mild or moderate severity, unspecified: Secondary | ICD-10-CM | POA: Diagnosis not present

## 2013-07-22 DIAGNOSIS — F322 Major depressive disorder, single episode, severe without psychotic features: Secondary | ICD-10-CM | POA: Diagnosis not present

## 2013-07-22 DIAGNOSIS — F332 Major depressive disorder, recurrent severe without psychotic features: Secondary | ICD-10-CM | POA: Diagnosis not present

## 2013-07-22 DIAGNOSIS — K219 Gastro-esophageal reflux disease without esophagitis: Secondary | ICD-10-CM | POA: Diagnosis not present

## 2013-07-22 DIAGNOSIS — R05 Cough: Secondary | ICD-10-CM | POA: Diagnosis not present

## 2013-07-22 DIAGNOSIS — I10 Essential (primary) hypertension: Secondary | ICD-10-CM | POA: Diagnosis not present

## 2013-07-22 DIAGNOSIS — F411 Generalized anxiety disorder: Secondary | ICD-10-CM | POA: Diagnosis not present

## 2013-07-22 DIAGNOSIS — Z880 Allergy status to penicillin: Secondary | ICD-10-CM | POA: Diagnosis not present

## 2013-07-22 DIAGNOSIS — J449 Chronic obstructive pulmonary disease, unspecified: Secondary | ICD-10-CM | POA: Diagnosis not present

## 2013-07-22 DIAGNOSIS — F3289 Other specified depressive episodes: Secondary | ICD-10-CM | POA: Diagnosis not present

## 2013-07-22 DIAGNOSIS — Z6379 Other stressful life events affecting family and household: Secondary | ICD-10-CM | POA: Diagnosis not present

## 2013-07-22 DIAGNOSIS — Z888 Allergy status to other drugs, medicaments and biological substances status: Secondary | ICD-10-CM | POA: Diagnosis not present

## 2013-07-25 ENCOUNTER — Ambulatory Visit: Payer: Medicare Other | Admitting: Family Medicine

## 2013-08-01 ENCOUNTER — Ambulatory Visit: Payer: Medicare Other | Admitting: Emergency Medicine

## 2013-08-02 ENCOUNTER — Ambulatory Visit (INDEPENDENT_AMBULATORY_CARE_PROVIDER_SITE_OTHER): Payer: Medicare Other | Admitting: Family Medicine

## 2013-08-02 ENCOUNTER — Encounter: Payer: Self-pay | Admitting: Family Medicine

## 2013-08-02 VITALS — BP 130/87 | HR 81 | Ht <= 58 in | Wt 114.3 lb

## 2013-08-02 DIAGNOSIS — F39 Unspecified mood [affective] disorder: Secondary | ICD-10-CM

## 2013-08-02 MED ORDER — PROMETHAZINE HCL 12.5 MG PO TABS: 12.5000 mg | ORAL_TABLET | Freq: Three times a day (TID) | ORAL | Status: DC | PRN

## 2013-08-02 NOTE — Progress Notes (Signed)
Subjective:    Patient ID: [name], female    DOB: 1945-10-17, 68 y.o.   MRN: [id number]  HPI  68 year old F who presents for evaluation   Depression - Pt with years of depression (although chart has a diagnosis of bipolar) , The patient was recently treated at [facility] from 4/19-4/26 for depression. She checked herself into the facility for treatment of severe depression. She states that her experience was not good. She feels that the doctor was not a good fit for her. She was started on Trazodone, which she helps her with sleeping but causing nightmares. She stopped the trazodone when she got out. She is still taking the buspar. Since discharge from [facility], she has tried to clean her apartment. She admits to hoarding as part of her struggle. She believes that her depression originates from numerous bad relationships in her life, including her failed marriage to her husband. Although, she is still in contact with him and very upset that he no longer has his dog which she cared for. She would like to be seen by a "counselor" and consider starting another medicine for depression. She denies SI/HI.   PMH - has tried seroquel, thorazine, haldol, welbutrin and effexor  Current Outpatient Prescriptions on File Prior to Visit  Medication Sig Dispense Refill  . albuterol (PROVENTIL HFA;VENTOLIN HFA) 108 (90 BASE) MCG/ACT inhaler Inhale 2 puffs into the lungs every 4 (four) hours as needed for wheezing or shortness of breath.  1 each  2  . alendronate (FOSAMAX) 70 MG tablet Take 70 mg by mouth every 7 (seven) days. Take with a full glass of water on an empty stomach.      . Ascorbic Acid (VITAMIN C) 500 MG tablet Take 2,000 mg by mouth daily.       [name] aspirin (HALFPRIN) 162 MG EC tablet Take 162 mg by mouth daily.        . baclofen (LIORESAL) 5 mg TABS tablet Take 0.5 tablets (5 mg total) by mouth 3 (three) times daily.  30 tablet  1  . busPIRone (BUSPAR) 7.5 MG tablet  Take 1 tablet (7.5 mg total) by mouth 2 (two) times daily.  60 tablet  2  . calcium-vitamin D (SM CALCIUM-VITAMIN D) 500-200 MG-UNIT per tablet Take 1 tablet by mouth 2 (two) times daily.        [name] dextran 70-hypromellose (TEARS RENEWED) ophthalmic solution Place 1 drop into the right eye 3 (three) times daily.  15 mL  12  . ibuprofen (ADVIL,MOTRIN) 200 MG tablet Take 1 tablet (200 mg total) by mouth every 6 (six) hours as needed.  30 tablet  0  . levothyroxine (SYNTHROID, LEVOTHROID) 75 MCG tablet TAKE 1 TABLET (75 MCG TOTAL) BY MOUTH DAILY. FOR THYROID. PLEASE KEEP SAME MANUFACTURER.  90 tablet  3  . lisinopril (PRINIVIL,ZESTRIL) 5 MG tablet TAKE 1 TABLET (5 MG TOTAL) BY MOUTH DAILY.  30 tablet  6  . omeprazole (PRILOSEC) 20 MG capsule Take 1 capsule (20 mg total) by mouth daily.  30 capsule  3  . Prenatal Vit-Fe Fumarate-FA (CLASSIC PRENATAL) 28-0.8 MG TABS Take 1 tablet by mouth daily.  90 tablet  3  . promethazine (PHENERGAN) 12.5 MG tablet Take 1 tablet (12.5 mg total) by mouth every 8 (eight) hours as needed for nausea or vomiting.  30 tablet  0  . promethazine (PHENERGAN) 25 MG suppository Place 0.5 suppositories (12.5 mg total) rectally every 6 (six) hours as  needed for nausea.  12 each  0  . simvastatin (ZOCOR) 10 MG tablet Take 10 mg by mouth every morning.      . venlafaxine XR (EFFEXOR-XR) 75 MG 24 hr capsule Take 225 mg by mouth daily.       . vitamin E 400 UNIT capsule Take 800 Units by mouth daily.      [name] zolpidem (AMBIEN) 5 MG tablet Take 1 tablet (5 mg total) by mouth at bedtime as needed.  30 tablet  2   No current facility-administered medications on file prior to visit.     Review of Systems positve for tearfulness and sadness Negative for SI/HI, hallucinations    Objective:   Physical Exam BP 130/87  Pulse 81  Ht 4\' 9"  (1.448 m)  Wt 114 lb 4.8 oz (51.846 kg)  BMI 24.73 kg/m2  LMP 01/09/2012   Gen: elderly WF, pleasant but tearful, pressured speech Psych:  pressured tangential speech, perseverates on actions of ex-husband during their marriage > 15 years ago; difficult to redirect, denies SI/HI, does not appear to be responding to internal stimuli     Assessment & Plan:   Mood Disorder - Pt clearly was depressed, but her pressured tangential speech was consistent with mania or hypomania. She has been on numerous anti-psychotics, so it is likely that she has bipolar disorder rather than unipolar depression. Given her desire for counseling and treatment, I believe that she would be a good candidate for mood disorder clinic. I have given her the information for Dr. Gwenlyn Saran and recommended follow up. The patient was agreeable.

## 2013-08-02 NOTE — Patient Instructions (Signed)
Ms. [name],   It was great to meet you. You are very kind, and I am sorry that you are have issues with severe depression. I recommend that you meet with our psychologist, Dr. Zella Ball, for help dealing with your depression.  You can schedule an appointment with her by calling her directly at [phone]. She can talk to you about the mood disorder clinic.   Please continue the buspar.   Sincerely,   Dr. Maricela Bo

## 2013-08-03 ENCOUNTER — Telehealth: Payer: Self-pay | Admitting: Psychology

## 2013-08-03 NOTE — Telephone Encounter (Signed)
[name] called to schedule an appointment.  She states she is most interested in counseling.  Having reviewed Dr. Thea Gist note, I suggested that perhaps we schedule both a counseling appointment and an appointment to take a look at her medications.  She agreed.  Scheduled beh med for 5/4 at 10:00 and [name] is 6/17 at 11:00 (first available for a new patient).    I told her the following: -  If she is unable to make the appointment she needs to call me. -  If she misses the appointment without a phone call, I won't be able to schedule her back in my clinic. She voiced an understanding and was able to repeat the appointment date and time back to me.

## 2013-08-06 ENCOUNTER — Ambulatory Visit: Payer: Medicare Other | Admitting: Family Medicine

## 2013-08-06 ENCOUNTER — Telehealth: Payer: Self-pay | Admitting: Psychology

## 2013-08-06 ENCOUNTER — Ambulatory Visit: Payer: Medicare Other | Admitting: Psychology

## 2013-08-06 NOTE — Telephone Encounter (Signed)
Got to work today (Monday) and had a VM from Alan Ripper from Friday.  Called her back and she said she left the VM before we talked last Friday and scheduled her appointment for today.  She stated she needed to get up and get moving in order to make it to her 10:00 appointment with me today.  She asked if she could come a little later - 10:15 and I told her that today she could.  She did not show up for her appointment nor did she attend her appointment with Dr. Maricela Bo.    I called her to discuss.  She said she tried to call to cancel but can never get through to front office.  Discussed my direct line.  She took down the number.  She is not sure she can make her 11:00 [place] appointment because it is too early in the day.  We will discuss when she comes for her rescheduled beh med appointment which is 5/17 at 2:00.  She wrote my phone number on the calendar where she wrote her appointment.  She was able to repeat back to me the day and time of the appointment, what it was for and the phone number where I could be reached DIRECTLY if she is unable to attend.

## 2013-08-11 ENCOUNTER — Encounter (HOSPITAL_COMMUNITY): Payer: Self-pay | Admitting: Emergency Medicine

## 2013-08-11 ENCOUNTER — Emergency Department (HOSPITAL_COMMUNITY)
Admission: EM | Admit: 2013-08-11 | Discharge: 2013-08-11 | Disposition: A | Discharge status: Home / Self Care | Payer: Medicare Other | Attending: Emergency Medicine | Admitting: Emergency Medicine

## 2013-08-11 ENCOUNTER — Emergency Department (INDEPENDENT_AMBULATORY_CARE_PROVIDER_SITE_OTHER)
Admission: EM | Admit: 2013-08-11 | Discharge: 2013-08-11 | Disposition: A | Discharge status: Hospice | Payer: Medicare Other | Source: Home / Self Care | Attending: Emergency Medicine | Admitting: Emergency Medicine

## 2013-08-11 ENCOUNTER — Emergency Department (HOSPITAL_COMMUNITY): Payer: Medicare Other

## 2013-08-11 DIAGNOSIS — K219 Gastro-esophageal reflux disease without esophagitis: Secondary | ICD-10-CM | POA: Insufficient documentation

## 2013-08-11 DIAGNOSIS — J449 Chronic obstructive pulmonary disease, unspecified: Secondary | ICD-10-CM | POA: Diagnosis not present

## 2013-08-11 DIAGNOSIS — Z8619 Personal history of other infectious and parasitic diseases: Secondary | ICD-10-CM | POA: Diagnosis not present

## 2013-08-11 DIAGNOSIS — E079 Disorder of thyroid, unspecified: Secondary | ICD-10-CM | POA: Diagnosis not present

## 2013-08-11 DIAGNOSIS — R079 Chest pain, unspecified: Secondary | ICD-10-CM | POA: Diagnosis not present

## 2013-08-11 DIAGNOSIS — F3289 Other specified depressive episodes: Secondary | ICD-10-CM | POA: Insufficient documentation

## 2013-08-11 DIAGNOSIS — M25579 Pain in unspecified ankle and joints of unspecified foot: Secondary | ICD-10-CM | POA: Diagnosis not present

## 2013-08-11 DIAGNOSIS — F329 Major depressive disorder, single episode, unspecified: Secondary | ICD-10-CM | POA: Diagnosis not present

## 2013-08-11 DIAGNOSIS — R0789 Other chest pain: Secondary | ICD-10-CM | POA: Insufficient documentation

## 2013-08-11 DIAGNOSIS — Z880 Allergy status to penicillin: Secondary | ICD-10-CM | POA: Diagnosis not present

## 2013-08-11 DIAGNOSIS — Z8742 Personal history of other diseases of the female genital tract: Secondary | ICD-10-CM | POA: Diagnosis not present

## 2013-08-11 DIAGNOSIS — Z8711 Personal history of peptic ulcer disease: Secondary | ICD-10-CM | POA: Diagnosis not present

## 2013-08-11 DIAGNOSIS — J438 Other emphysema: Secondary | ICD-10-CM | POA: Diagnosis not present

## 2013-08-11 DIAGNOSIS — F411 Generalized anxiety disorder: Secondary | ICD-10-CM | POA: Diagnosis not present

## 2013-08-11 DIAGNOSIS — Z7982 Long term (current) use of aspirin: Secondary | ICD-10-CM | POA: Diagnosis not present

## 2013-08-11 DIAGNOSIS — M810 Age-related osteoporosis without current pathological fracture: Secondary | ICD-10-CM | POA: Diagnosis not present

## 2013-08-11 DIAGNOSIS — R011 Cardiac murmur, unspecified: Secondary | ICD-10-CM | POA: Insufficient documentation

## 2013-08-11 DIAGNOSIS — F17200 Nicotine dependence, unspecified, uncomplicated: Secondary | ICD-10-CM | POA: Insufficient documentation

## 2013-08-11 DIAGNOSIS — Z79899 Other long term (current) drug therapy: Secondary | ICD-10-CM | POA: Insufficient documentation

## 2013-08-11 DIAGNOSIS — J439 Emphysema, unspecified: Secondary | ICD-10-CM

## 2013-08-11 LAB — BASIC METABOLIC PANEL
BUN: 19 mg/dL (ref 6–23)
CO2: 24 mEq/L (ref 19–32)
Calcium: 8.3 mg/dL — ABNORMAL LOW (ref 8.4–10.5)
Chloride: 106 mEq/L (ref 96–112)
Creatinine, Ser: 0.72 mg/dL (ref 0.50–1.10)
GFR calc Af Amer: >90 mL/min (ref >90)
GFR calc non Af Amer: 86 mL/min — ABNORMAL LOW (ref >90)
Glucose, Bld: 107 mg/dL — ABNORMAL HIGH (ref 70–99)
Potassium: 3.7 mEq/L (ref 3.7–5.3)
Sodium: 142 mEq/L (ref 137–147)

## 2013-08-11 LAB — CBC
HCT: 34.7 % — ABNORMAL LOW (ref 36.0–46.0)
Hemoglobin: 11.4 g/dL — ABNORMAL LOW (ref 12.0–15.0)
MCH: 32.6 pg (ref 26.0–34.0)
MCHC: 32.9 g/dL (ref 30.0–36.0)
MCV: 99.1 fL (ref 78.0–100.0)
Platelets: 275 10*3/uL (ref 150–400)
RBC: 3.5 MIL/uL — ABNORMAL LOW (ref 3.87–5.11)
RDW: 13.5 % (ref 11.5–15.5)
WBC: 4.5 10*3/uL (ref 4.0–10.5)

## 2013-08-11 LAB — TROPONIN I: Troponin I: <0.3 ng/mL (ref <0.30)

## 2013-08-11 MED ORDER — ALBUTEROL SULFATE HFA 108 (90 BASE) MCG/ACT IN AERS: 2.0000 | INHALATION_SPRAY | RESPIRATORY_TRACT | Status: DC | PRN

## 2013-08-11 MED ORDER — ASPIRIN 81 MG PO CHEW
324.0000 mg | CHEWABLE_TABLET | Freq: Once | ORAL | Status: AC
  Administered 2013-08-11: 324 mg via ORAL

## 2013-08-11 MED ORDER — ASPIRIN 81 MG PO CHEW
CHEWABLE_TABLET | ORAL | Status: AC
  Filled 2013-08-11: qty 4

## 2013-08-11 MED ORDER — SODIUM CHLORIDE 0.9 % IV SOLN
Freq: Once | INTRAVENOUS | Status: AC
  Administered 2013-08-11: 15:00:00 via INTRAVENOUS

## 2013-08-11 MED ORDER — NITROGLYCERIN 0.4 MG SL SUBL
SUBLINGUAL_TABLET | SUBLINGUAL | Status: AC
  Filled 2013-08-11: qty 1

## 2013-08-11 MED ORDER — NITROGLYCERIN 0.4 MG SL SUBL: 0.4000 mg | SUBLINGUAL_TABLET | SUBLINGUAL | Status: DC | PRN

## 2013-08-11 NOTE — ED Notes (Addendum)
Pt placed on 2L O2 by [place] and cardiac monitor : 104bpm

## 2013-08-11 NOTE — Discharge Instructions (Signed)
Chronic Obstructive Pulmonary Disease Exacerbation °Chronic obstructive pulmonary disease (COPD) is a common lung condition in which airflow from the lungs is limited. COPD is a general term that can be used to describe many different lung problems that limit airflow, including chronic bronchitis and emphysema. COPD exacerbations are episodes when breathing symptoms become much worse and require extra treatment. Without treatment, COPD exacerbations can be life threatening, and frequent COPD exacerbations can cause further damage to your lungs. °CAUSES  °· Respiratory infections.   °· Exposure to smoke.   °· Exposure to air pollution, chemical fumes, or dust. °Sometimes there is no apparent cause or trigger. °RISK FACTORS °· Smoking cigarettes. °· Older age. °· Frequent prior COPD exacerbations. °SIGNS AND SYMPTOMS  °· Increased coughing.   °· Increased thick spit (sputum) production.   °· Increased wheezing.   °· Increased shortness of breath.   °· Rapid breathing.   °· Chest tightness. °DIAGNOSIS  °Your medical history, a physical exam, and tests will help your health care provider make a diagnosis. Tests may include: °· A chest X-ray. °· Basic lab tests. °· Sputum testing. °· An arterial blood gas test. °TREATMENT  °Depending on the severity of your COPD exacerbation, you may need to be admitted to a hospital for treatment. Some of the treatments commonly used to treat COPD exacerbations are:  °· Antibiotic medicines.   °· Bronchodilators. These are drugs that expand the air passages. They may be given with an inhaler or nebulizer. Spacer devices may be needed to help improve drug delivery. °· Corticosteroid medicines. °· Supplemental oxygen therapy.   °HOME CARE INSTRUCTIONS  °· Do not smoke. Quitting smoking is very important to prevent COPD from getting worse and exacerbations from happening as often. °· Avoid exposure to all substances that irritate the airway, especially to tobacco smoke.   °· If prescribed,  take your antibiotics as directed. Finish them even if you start to feel better. °· Only take over-the-counter or prescription medicines as directed by your health care provider. It is important to use correct technique with inhaled medicines. °· Drink enough fluids to keep your urine clear or pale yellow (unless you have a medical condition that requires fluid restriction). °· Use a cool mist vaporizer. This makes it easier to clear your chest when you cough.   °· If you have a home nebulizer and oxygen, continue to use them as directed.   °· Maintain all necessary vaccinations to prevent infections.   °· Exercise regularly.   °· Eat a healthy diet.   °· Keep all follow-up appointments as directed by your health care provider. °SEEK IMMEDIATE MEDICAL CARE IF: °· You have worsening shortness of breath.   °· You have trouble talking.   °· You have severe chest pain. °· You have blood in your sputum.  °· You have a fever. °· You have weakness, vomit repeatedly, or faint.   °· You feel confused.   °· You continue to get worse. °MAKE SURE YOU:  °· Understand these instructions. °· Will watch your condition. °· Will get help right away if you are not doing well or get worse. °Document Released: 01/17/2007 Document Revised: 01/10/2013 Document Reviewed: 11/24/2012 °[employer]® Patient Information ©2014 [employer]. ° °

## 2013-08-11 NOTE — ED Provider Notes (Signed)
Patient states she is not having chest pain but her chest feels tight. She states she knows what is from. She does "it's from my COPD". Patient however refusing to have a nebulizer.  Patient is alert and cooperative, she is talking in complete sentences, she has good air movement.  Medical screening examination/treatment/procedure(s) were conducted as a shared visit with non-physician practitioner(s) and myself.  I personally evaluated the patient during the encounter.   EKG Interpretation   Date/Time:  Saturday Aug 11 2013 15:33:14 EDT Ventricular Rate:  87 PR Interval:  158 QRS Duration: 77 QT Interval:  362 QTC Calculation: 435 R Axis:   88 Text Interpretation:  Sinus rhythm Borderline right axis deviation  Probable anteroseptal infarct, old Nonspecific T abnormalities, lateral  leads No significant change since last tracing Confirmed by Elma  MD-I,  Hermen Mario ([id number]) on 08/11/2013 4:53:06 PM       Rolland Porter, MD, Alanson Aly, MD 08/11/13 1911

## 2013-08-11 NOTE — ED Provider Notes (Signed)
CSN: [id number]     Arrival date & time 08/11/13  1416 History   None    Chief Complaint  Patient presents with  . Shortness of Breath  . Chest Pain  . Foot Pain   (Consider location/radiation/quality/duration/timing/severity/associated sxs/prior Treatment) HPI Comments: Pt is very poor historian. Unable to determine if chest tightness c/o is chronic or if something is different today. Pt also c/o foot pain.   Patient is a 68 y.o. female presenting with chest pain. The history is provided by the patient.  Chest Pain Pain location:  Unable to specify Pain quality: tightness   Pain severity:  Severe Onset quality:  Unable to specify Duration:  4 weeks Timing:  Unable to specify Progression:  Worsening Chronicity:  New Relieved by:  Nothing Worsened by:  Nothing tried Ineffective treatments:  None tried Associated symptoms: fatigue and shortness of breath   Associated symptoms: no palpitations     Past Medical History  Diagnosis Date  . OCD (obsessive compulsive disorder)   . Depression   . Anxiety   . PUD (peptic ulcer disease) 09/2006    H pylori  . Diverticulosis of colon   . GERD (gastroesophageal reflux disease)   . COPD (chronic obstructive pulmonary disease)     Due to long history of tobacco abuse, still smoking  . History of uterine prolapse 10/2004    Dr Cletis Media  . Thyroid disease     Ho R thyroid noduel plus mild hyperthyroidism. Treated with radioiodine therapty on 04/2006. Dr Estrella Myrtle.  [name] Heart murmur   . Osteoporosis    Past Surgical History  Procedure Laterality Date  . Colonoscopy  06/03/2005    Hyperplastic polyps, sigmoid diverticulosis, internal hemorroids  . Ganglion removal  12/2005    R wrist subretinacular dorsal ganglion  . Biopsy thyroid  08/2008    Atypia and Hurtle cells, partial thyroidectomy   . Tonsillectomy and adenoidectomy  10/2004  . Pft  11/2006    Obstructive pattern.  Poor response to bronchodilators.   . Thyroidectomy, partial  08/2008   Dr Ronnald Collum  . Transthoracic echocardiogram  08/06/6268    Normal systolic function.  EF 55-60%.  Mild MR, atria ok, trivial pericardial effusion  . Carotid ultrasound  09/10/09    No significant extracranial carotid artery stenosis, vertebrals are patent with antegrade flow.   . Mri head  09/20/09    No acute intracranial abn.  Signal abnormality suggestive of chronic small vessel ischemia, maximal in the right subinsular white and deep gray mater.   . Tvt  06/15/10    Tension-free Vaginal Tape, Dr Mancel Bale, for urge incontinence  . Abdominal hysterectomy    . Bladder suspension      mesh sling   Family History  Problem Relation Age of Onset  . Heart disease Mother   . Heart disease Maternal Grandmother   . Heart disease Maternal Grandfather   . Cirrhosis Sister   . Hypertension Sister   . Other Neg Hx    History  Substance Use Topics  . Smoking status: Current Every Day Smoker -- 0.20 packs/day for 35 years    Types: Cigarettes    Last Attempt to Quit: 05/25/2012  . Smokeless tobacco: Never Used     Comment: smokes less than 1 pack per week. ready to quitt.  . Alcohol Use: No   OB History   Grav Para Term Preterm Abortions TAB SAB Ect Mult Living   2  2     Review of Systems  Constitutional: Positive for fatigue.  Respiratory: Positive for shortness of breath.   Cardiovascular: Positive for chest pain. Negative for palpitations and leg swelling.    Allergies  Amoxicillin; Ciprofloxacin; Haloperidol lactate; Ketorolac tromethamine; and Wellbutrin  Home Medications   Prior to Admission medications   Medication Sig Start Date End Date Taking? Authorizing Provider  albuterol (PROVENTIL HFA;VENTOLIN HFA) 108 (90 BASE) MCG/ACT inhaler Inhale 2 puffs into the lungs every 4 (four) hours as needed for wheezing or shortness of breath. 06/28/13   Gregor Hams, MD  alendronate (FOSAMAX) 70 MG tablet Take 70 mg by mouth every 7 (seven) days. Take with a full glass of water on  an empty stomach.    Historical Provider, MD  Ascorbic Acid (VITAMIN C) 500 MG tablet Take 2,000 mg by mouth daily.     Historical Provider, MD  aspirin (HALFPRIN) 162 MG EC tablet Take 162 mg by mouth daily.      Historical Provider, MD  baclofen (LIORESAL) 5 mg TABS tablet Take 0.5 tablets (5 mg total) by mouth 3 (three) times daily. 06/27/13   Kandis Nab, MD  busPIRone (BUSPAR) 7.5 MG tablet Take 1 tablet (7.5 mg total) by mouth 2 (two) times daily. 06/27/13   Kandis Nab, MD  calcium-vitamin D (SM CALCIUM-VITAMIN D) 500-200 MG-UNIT per tablet Take 1 tablet by mouth 2 (two) times daily.      Historical Provider, MD  dextran 70-hypromellose (TEARS RENEWED) ophthalmic solution Place 1 drop into the right eye 3 (three) times daily. 10/30/12   Beverlyn Roux, MD  ibuprofen (ADVIL,MOTRIN) 200 MG tablet Take 1 tablet (200 mg total) by mouth every 6 (six) hours as needed. 06/21/13   Kandis Nab, MD  levothyroxine (SYNTHROID, LEVOTHROID) 75 MCG tablet TAKE 1 TABLET (75 MCG TOTAL) BY MOUTH DAILY. FOR THYROID. PLEASE KEEP SAME MANUFACTURER. 06/11/13   Beverlyn Roux, MD  lisinopril (PRINIVIL,ZESTRIL) 5 MG tablet TAKE 1 TABLET (5 MG TOTAL) BY MOUTH DAILY. 03/07/13   Beverlyn Roux, MD  omeprazole (PRILOSEC) 20 MG capsule Take 1 capsule (20 mg total) by mouth daily. 06/08/13   Vance Gather, MD  Prenatal Vit-Fe Fumarate-FA (CLASSIC PRENATAL) 28-0.8 MG TABS Take 1 tablet by mouth daily. 06/21/13   Kandis Nab, MD  promethazine (PHENERGAN) 12.5 MG tablet Take 1 tablet (12.5 mg total) by mouth every 8 (eight) hours as needed for nausea or vomiting. 08/02/13   Angelica Ran, MD  simvastatin (ZOCOR) 10 MG tablet Take 10 mg by mouth every morning.    Historical Provider, MD  venlafaxine XR (EFFEXOR-XR) 75 MG 24 hr capsule Take 225 mg by mouth daily.     Historical Provider, MD  vitamin E 400 UNIT capsule Take 800 Units by mouth daily.    Historical Provider, MD  zolpidem (AMBIEN) 5 MG tablet Take 1 tablet (5 mg  total) by mouth at bedtime as needed. 01/27/13   Beverlyn Roux, MD   LMP 01/09/2012 Physical Exam  Constitutional: She appears well-developed and well-nourished. No distress.  Cardiovascular: Regular rhythm.  Tachycardia present.   Pulmonary/Chest: Effort normal and breath sounds normal.    ED Course  Procedures (including critical care time) Labs Review Labs Reviewed - No data to display  Imaging Review No results found.   MDM   1. Chest pain   EKG sinus tach 103bpm with ST depression in V2, V3 and AVF. This is a change from previous EKG. Pt very  poor historian. Unable to get timeline and details of sx. Discussed with Dr. Jake Michaelis. Transfer to ER for further eval chest pain via ems.      Carvel Getting, NP 08/11/13 [phone]

## 2013-08-11 NOTE — ED Provider Notes (Signed)
CSN: [id number]     Arrival date & time 08/11/13  1522 History   First MD Initiated Contact with Patient 08/11/13 1524     Chief Complaint  Patient presents with  . Chest Pain     (Consider location/radiation/quality/duration/timing/severity/associated sxs/prior Treatment) HPI  [name] is a(n) 68 y.o. female who presents to the emergency department from the urgent care Center chief complaint of chest tightness and bilateral foot pain. Patient is a poor historian. Her story is somewhat tangential. She has a previous history of obsessive-compulsive disorder, depression, hoarding, thyroid disease, smoking history osteoporosis. She states that she has had chest tightness for over one month. Patient [facility] where she was noted to have an asthma exacerbation. Patient refused a neb treatment at that time was given Depo-Medrol and an albuterol inhaler was given return precautions by the provider. The patient denies any palpitations, chest pain, shortness of breath. She states that she feels "under the weather." She also states that she "just wants to feel better." She also complains of bilateral foot pain. She states that she has a known Morton's neuroma in the left foot. She denies any swelling, paresthesia, injuries. It is moderate. She has pain with palpation and walking.  Past Medical History  Diagnosis Date  . OCD (obsessive compulsive disorder)   . Depression   . Anxiety   . PUD (peptic ulcer disease) 09/2006    H pylori  . Diverticulosis of colon   . GERD (gastroesophageal reflux disease)   . COPD (chronic obstructive pulmonary disease)     Due to long history of tobacco abuse, still smoking  . History of uterine prolapse 10/2004    Dr Cletis Media  . Thyroid disease     Ho R thyroid noduel plus mild hyperthyroidism. Treated with radioiodine therapty on 04/2006. Dr Estrella Myrtle.  [name] Heart murmur   . Osteoporosis    Past Surgical History  Procedure  Laterality Date  . Colonoscopy  06/03/2005    Hyperplastic polyps, sigmoid diverticulosis, internal hemorroids  . Ganglion removal  12/2005    R wrist subretinacular dorsal ganglion  . Biopsy thyroid  08/2008    Atypia and Hurtle cells, partial thyroidectomy   . Tonsillectomy and adenoidectomy  10/2004  . Pft  11/2006    Obstructive pattern.  Poor response to bronchodilators.   . Thyroidectomy, partial  08/2008    Dr Ronnald Collum  . Transthoracic echocardiogram  7/0/0174    Normal systolic function.  EF 55-60%.  Mild MR, atria ok, trivial pericardial effusion  . Carotid ultrasound  09/10/09    No significant extracranial carotid artery stenosis, vertebrals are patent with antegrade flow.   . Mri head  09/20/09    No acute intracranial abn.  Signal abnormality suggestive of chronic small vessel ischemia, maximal in the right subinsular white and deep gray mater.   . Tvt  06/15/10    Tension-free Vaginal Tape, Dr Mancel Bale, for urge incontinence  . Abdominal hysterectomy    . Bladder suspension      mesh sling   Family History  Problem Relation Age of Onset  . Heart disease Mother   . Heart disease Maternal Grandmother   . Heart disease Maternal Grandfather   . Cirrhosis Sister   . Hypertension Sister   . Other Neg Hx    History  Substance Use Topics  . Smoking status: Current Every Day Smoker -- 0.20 packs/day for 35 years    Types:  Cigarettes    Last Attempt to Quit: 05/25/2012  . Smokeless tobacco: Never Used     Comment: smokes less than 1 pack per week. ready to quitt.  . Alcohol Use: No   OB History   Grav Para Term Preterm Abortions TAB SAB Ect Mult Living   2         2     Review of Systems  Ten systems reviewed and are negative for acute change, except as noted in the HPI.    Allergies  Amoxicillin; Ciprofloxacin; Haloperidol lactate; Ketorolac tromethamine; and Wellbutrin  Home Medications   Prior to Admission medications   Medication Sig Start Date End Date  Taking? Authorizing Provider  Ascorbic Acid (VITAMIN C) 500 MG tablet Take 3,000 mg by mouth daily.    Yes Historical Provider, MD  aspirin 325 MG tablet Take 162.5 mg by mouth See admin instructions. Take 1/2 tablet (162.5 mg) every morning, take an additional 1/2 tablet (162.5 mg) in the evening if needed for pain   Yes Historical Provider, MD  busPIRone (BUSPAR) 7.5 MG tablet Take 1 tablet (7.5 mg total) by mouth 2 (two) times daily. 06/27/13  Yes Kandis Nab, MD  CALCIUM PO Take 1 tablet by mouth daily.   Yes Historical Provider, MD  COD LIVER OIL PO Take 1 capsule by mouth daily.   Yes Historical Provider, MD  folic acid (FOLVITE) 423 MCG tablet Take 400 mcg by mouth daily.   Yes Historical Provider, MD  levothyroxine (SYNTHROID, LEVOTHROID) 75 MCG tablet Take 75 mcg by mouth daily before breakfast.   Yes Historical Provider, MD  lisinopril (PRINIVIL,ZESTRIL) 5 MG tablet Take 5 mg by mouth daily.   Yes Historical Provider, MD  omeprazole (PRILOSEC) 20 MG capsule Take 1 capsule (20 mg total) by mouth daily. 06/08/13  Yes Vance Gather, MD  OVER THE COUNTER MEDICATION Take 0.5 tablets by mouth at bedtime. Equate Night time   Yes Historical Provider, MD  promethazine (PHENERGAN) 12.5 MG tablet Take 3.125 mg by mouth daily as needed for nausea or vomiting.   Yes Historical Provider, MD  vitamin E 400 UNIT capsule Take 800 Units by mouth daily.   Yes Historical Provider, MD   BP 113/62  Pulse 79  Temp(Src) 98.1 F (36.7 C) (Oral)  Resp 17  Ht 4\' 9"  (1.448 m)  Wt 115 lb (52.164 kg)  BMI 24.88 kg/m2  SpO2 100%  LMP 01/09/2012 Physical Exam Physical Exam  Nursing note and vitals reviewed. Constitutional: She is oriented to person, place, and time. She appears well-developed and well-nourished. No distress.  HENT:  Head: Normocephalic and atraumatic.  Eyes: Conjunctivae normal and EOM. Anisocoria. No scleral icterus.  Neck: Normal range of motion.  Cardiovascular: Normal rate, regular  rhythm and normal heart sounds.  Exam reveals no gallop and no friction rub.   No murmur heard. Pulmonary/Chest: Effort normal and breath sounds normal. No respiratory distress.  No wheezing. Abdominal: Soft. Bowel sounds are normal. She exhibits no distension and no mass. There is no tenderness. There is no guarding.  Neurological: She is alert and oriented to person, place, and time.  Skin: Skin is warm and dry. She is not diaphoretic.  Musculoskeletal: BL ttp foot arches. Distal pulses intact. Feet are filthy.   ED Course  Procedures (including critical care time) Labs Review Labs Reviewed  CBC - Abnormal; Notable for the following:    RBC 3.50 (*)    Hemoglobin 11.4 (*)    HCT  34.7 (*)    All other components within normal limits  BASIC METABOLIC PANEL  TROPONIN I    Imaging Review No results found.   EKG Interpretation   Date/Time:  Saturday Aug 11 2013 15:33:14 EDT Ventricular Rate:  87 PR Interval:  158 QRS Duration: 77 QT Interval:  362 QTC Calculation: 435 R Axis:   88 Text Interpretation:  Sinus rhythm Borderline right axis deviation  Probable anteroseptal infarct, old Nonspecific T abnormalities, lateral  leads No significant change since last tracing Confirmed by KNAPP  MD-I,  IVA ([id number]) on 08/11/2013 4:53:06 PM      MDM   Final diagnoses:  Chest tightness  Emphysema/COPD    patient seen in shared visit with Dr. Tomi Bamberger. She denies any chest pain, nausea, diaphoresis, left arm, jaw, or shoulder pain. No signs of ACS, No changes to EKG and negative troponin. Chest pressure for >1 month. No concern for PE. Patient labs without acute abnormality. She was offered a neb treatment for sxs relief but became irate that it was not offered 3 hours ago. She has become increasingly more manic, pressured and erratic throughout the visit. She refuses neb treatment. Patient is discharged home. Return precautions discussed. The patient appears reasonably screened and/or  stabilized for discharge and I doubt any other medical condition or other [facility] requiring further screening, evaluation, or treatment in the ED at this time prior to discharge.      Margarita Mail, PA-C 08/14/13 [phone]

## 2013-08-11 NOTE — ED Notes (Signed)
Patient returned from X-ray 
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
## 2013-08-11 NOTE — ED Notes (Signed)
Patient upset about being discharged without a breathing treatment. States her time was wasted. Patient states she is leaving and is pacing around room.

## 2013-08-11 NOTE — ED Provider Notes (Signed)
Medical screening examination/treatment/procedure(s) were performed by non-physician practitioner and as supervising physician I was immediately available for consultation/collaboration.  Philipp Deputy, M.D.  Harden Mo, MD 08/11/13 [phone]

## 2013-08-14 ENCOUNTER — Ambulatory Visit: Payer: Medicare Other | Admitting: Family Medicine

## 2013-08-14 NOTE — ED Provider Notes (Signed)
See prior note   Janice Norrie, MD 08/14/13 [phone]

## 2013-08-15 ENCOUNTER — Ambulatory Visit: Payer: Medicare Other | Admitting: Psychology

## 2013-08-16 DIAGNOSIS — R141 Gas pain: Secondary | ICD-10-CM | POA: Diagnosis not present

## 2013-08-16 DIAGNOSIS — R159 Full incontinence of feces: Secondary | ICD-10-CM | POA: Diagnosis not present

## 2013-08-16 DIAGNOSIS — K219 Gastro-esophageal reflux disease without esophagitis: Secondary | ICD-10-CM | POA: Diagnosis not present

## 2013-08-16 DIAGNOSIS — K5730 Diverticulosis of large intestine without perforation or abscess without bleeding: Secondary | ICD-10-CM | POA: Diagnosis not present

## 2013-08-16 DIAGNOSIS — R142 Eructation: Secondary | ICD-10-CM | POA: Diagnosis not present

## 2013-08-16 DIAGNOSIS — K5900 Constipation, unspecified: Secondary | ICD-10-CM | POA: Diagnosis not present

## 2013-08-16 DIAGNOSIS — R143 Flatulence: Secondary | ICD-10-CM | POA: Diagnosis not present

## 2013-08-17 ENCOUNTER — Encounter: Payer: Self-pay | Admitting: *Deleted

## 2013-08-17 ENCOUNTER — Telehealth: Payer: Self-pay | Admitting: Psychology

## 2013-08-17 NOTE — Telephone Encounter (Signed)
[name] left a VM on Thursday stating that she would like to reschedule her missed appointment from the day before.  Called her back to discuss.  She came up here for her appointment and the door was closed secondary to the [employer].  I had called her earlier in the day to let her know this would be the case and that I would come out and let her in.  She did not get the message until after her appointment.  Rescheduled for May 26th at 2:30.  She plans to go to [facility], perhaps today, to discuss antidepressant therapy.  Much of the "negative thoughts" have gone away but she says the sadness remains.  Will follow on the 26th.

## 2013-08-17 NOTE — Progress Notes (Signed)
Pt walked into clinic for triage of depression and insomnia.  Pt stated she was having trouble sleeping, crying a lot and don't have the best quality of life any more.  Pt wanted to speak with Zella Ball, MD but Dr. Gwenlyn Saran was out of office this afternoon.  Pt stated she went to [facility], who treats her depression and insomnia but they could not see her.  Pt stated she didn't want to go back there again because she does not like it there and wanted to start treatment her for her depression.  Pt denied any symptoms of suicidal ideations or harming anyone else.  She just wanted to talk with someone today.  Nurse sat and talk with pt for about 20 minutes, pt cried and explained she really wanted to have treatment started here.  Pt has family history of depression.  Pt informed that is she needed help or get the feeling to harm herself/someone else to go to Urgent Care or Emergency room.  Pt stated understanding and stated she does not want to hurt herself or anyone one else and will wait for her appt on Monday.  Pt thanked nurse for listening and appt was made for Monday 08/20/2013 at 3:45 PM.  Derl Barrow, RN

## 2013-08-20 ENCOUNTER — Encounter: Payer: Self-pay | Admitting: Family Medicine

## 2013-08-20 ENCOUNTER — Ambulatory Visit (INDEPENDENT_AMBULATORY_CARE_PROVIDER_SITE_OTHER): Payer: Medicare Other | Admitting: Family Medicine

## 2013-08-20 VITALS — BP 103/72 | HR 105 | Ht <= 58 in | Wt 117.0 lb

## 2013-08-20 DIAGNOSIS — G4700 Insomnia, unspecified: Secondary | ICD-10-CM | POA: Insufficient documentation

## 2013-08-20 DIAGNOSIS — F319 Bipolar disorder, unspecified: Secondary | ICD-10-CM

## 2013-08-20 DIAGNOSIS — E039 Hypothyroidism, unspecified: Secondary | ICD-10-CM

## 2013-08-20 MED ORDER — ZOLPIDEM TARTRATE 5 MG PO TABS: 5.0000 mg | ORAL_TABLET | Freq: Every evening | ORAL | Status: DC | PRN

## 2013-08-20 NOTE — Assessment & Plan Note (Signed)
Patient has insomnia which I beleive is related to her depression/?Bipolar Disorder. I have provided her with a very short course of Ambien to help her with sleep at her request. I counseled her that this is not a long-term solution and that long-term followup with counseling/psychiatry as well as pharmacotherapy for her depression/? Bipolar is warranted. Only 10 pills were provided to prevent against overdose.

## 2013-08-20 NOTE — Progress Notes (Signed)
   Subjective:    Patient ID: [name], female    DOB: [birth year], 68 y.o.   MRN: [id number]  HPI 68 year old female with past medical history of major depression and possible bipolar disorder presents for followup of depressed mood, patient states that she has had a depressed mood since Christmas of 2014, she was actually recently hospitalized in [place] for depression, she was last seen in our office on 08/02/2013 and was referred to the mood clinic with Dr. Gwenlyn Saran. The patient has since missed multiple appointments with Dr. Gwenlyn Saran. Patient states that she has a previous history of depression in 08-05-2001 after the death of her mother, she is followed by [facility] however her psychiatrist recently left and she does not wish to return to [facility], patient has been on multiple medications including Latude, Seroquel, and Effexor. She did not tolerate these medications due to side effects. She is currently only on BuSpar. Patient reports anhedonia, depressed mood, decreased concentration, difficulty sleeping, she denies homicidal or suicidal thoughts, denies hallucinations, she denies staying up for long periods of time.   Review of Systems  Constitutional: Negative for fever, chills and fatigue.  Respiratory: Negative for cough and shortness of breath.   Cardiovascular: Negative for chest pain.  Psychiatric/Behavioral: Positive for sleep disturbance and decreased concentration. Negative for suicidal ideas, hallucinations and self-injury. The patient is nervous/anxious. The patient is not hyperactive.        Objective:   Physical Exam Vitals: Reviewed General: Pleasant Caucasian female, appears anxious Cardiac: Regular in rhythm, S1 and S2 present, no murmurs, no heaves or thrills Respiratory: Clear to patient bilaterally, normal effort Psych: Appropriately dressed, crying intermittently throughout the exam, patient states her mood is depressed, she has flight of ideas and pressured speech, patient  denies homicidal or suicidal thoughts at this time     Assessment & Plan:  Please see problem specific assessment and plan.

## 2013-08-20 NOTE — Assessment & Plan Note (Addendum)
Patient presents for evaluation of depression however she has signs/symptoms consistent with bipolar disorder. No changes were made to her medication therapy today as she has scheduled followup with Dr. Gwenlyn Saran next week. I personally discussed this patient with Dr. Gwenlyn Saran and she agrees with this plan at this time. -Patient was counseled to call the office immediately or go to the emergency room immediately if she has homicidal or suicidal thoughts.

## 2013-08-20 NOTE — Assessment & Plan Note (Signed)
Patient states that her thyroid was recently checked by her GI physician and was within normal limits, therefore her hypothyroidism is not likely contributing to her current symptoms.

## 2013-08-20 NOTE — Progress Notes (Signed)
I spoke with patient on Thursday to reschedule her [facility] appointment for 5/26 at 2:30.  She can be treated for her mental health issues at the [facility] but first available [facility] appointment was not until June 17th at 11:00.  Her mental health issues are significantly severe that she likely needs specialty care.  I think she is limited in terms of her options secondary to her insurance status.  [name] is an option she can exercise until she can be seen in [facility].

## 2013-08-20 NOTE — Patient Instructions (Signed)
Please keep your appointment with Dr. Gwenlyn Saran tomorrow. Dr. Ree Kida will speak to Dr. Gwenlyn Saran about medication choice.

## 2013-08-21 ENCOUNTER — Telehealth: Payer: Self-pay | Admitting: Psychology

## 2013-08-21 ENCOUNTER — Encounter: Payer: Self-pay | Admitting: *Deleted

## 2013-08-21 NOTE — Progress Notes (Signed)
Prior authorization for zolpidem faxed to [facility] for review.  Derl Barrow, RN

## 2013-08-21 NOTE — Progress Notes (Signed)
Patient ID: [name], female   DOB: 1946/03/07, 68 y.o.   MRN: [id number] Prior authorization form completed and returned to [facility].

## 2013-08-21 NOTE — Telephone Encounter (Signed)
Ms. Tirpak left four voice mails over a 48 minute period of time.  She is very upset that she does not have an appointment today and believes that I made a mistake in scheduling.  She expressed her dissatisfaction in a number of different ways including calling me names and stated that I "better never her call [her] number again" and that she would be speaking to a the "head administrator."  Follow-up VMs demanded that I speak with the doctor she saw yesterday and get an antidepressant called in today.    Of note - she remains confused as to what her upcoming appointment is for and I am very sure that I communicated this to her several times on the phone.  She has a [facility] Appointment scheduled for June 17th at 11:00.  That is when she was told we would take a look at her medication.  I acknowledged this appointment was a ways away and encouraged her to follow-up at [facility] in the mean time.  Her appointment scheduled for next week is for counseling - something she also requested.    I would like to consult with the practice manager prior to calling the patient back however the practice manager has PAL today and is unavailable so I elected to call the patient back.  She remains confused about her appointments despite saying she wrote them on her calendar last time.  We reviewed the different appointments ([facility] vs. [facility]) and she stated she was writing them on her calendar.  Her [facility] appointment is May 26th at 2:30.  She reiterated her thought that she should be given an antidepressant by physicians here.  I think the scope of her mental health issues is beyond the level of expertise of a family physician.  She was encouraged to go to [facility] for medication evaluation until she can be seen in our [facility].

## 2013-08-21 NOTE — Telephone Encounter (Signed)
Dr. Ree Kida saw [name] yesterday and noted some possible confusion about her beh med appointment.  She might have thought it was today rather than next Tuesday.  I called the patient to clarify and left a VM.

## 2013-08-21 NOTE — Progress Notes (Signed)
Prior Authorization received from [facility] for zolpidem 5 mg tablet.  PA form placed in provider box for completion. Derl Barrow, RN

## 2013-08-21 NOTE — Telephone Encounter (Signed)
[name] called back 15 minutes after her last phone call and wondered what the appointment was for on June 17th.  I called her back and asked what she remembered from our conversation.  She didn't remember anything.  I gave her the information about the [facility] Appointment on the 17th.  She asked why she couldn't be seen sooner (something we had also discussed).  Memory issues are not on her problem list.  She may be a good candidate for geri clinic.  I am not sure who helps take care of her in her home but based on my interaction thus far, I am wondering about her functional status.  I only see what phone note from our social worker, [name], to discuss advance directives back in 2013.  A social work referral may be helpful as well.

## 2013-08-24 NOTE — Progress Notes (Signed)
PA for zolpidem 5 mg tab was approved from [place] 04/05/2013 through 04/04/2014.  PA number:  [id number].  Approval faxed to [facility].

## 2013-08-28 ENCOUNTER — Ambulatory Visit (INDEPENDENT_AMBULATORY_CARE_PROVIDER_SITE_OTHER): Payer: Medicare Other | Admitting: Psychology

## 2013-08-28 DIAGNOSIS — F39 Unspecified mood [affective] disorder: Secondary | ICD-10-CM

## 2013-08-28 NOTE — Progress Notes (Signed)
Presenting Issue: Ms. Froio presents requesting both pharmacologic and counseling for depression.  Report of symptoms: She is tearful and spontaneously reports the following symptoms:  Decreased appetite, loss of interest in things (e.g. Reading), irritability that eventually turned to unrelenting sadness, "paranoid" (thinks people might be looking at her) and getting bored easily.  When asked, she reports sleep difficulty with (perhaps) the most difficulty being staying asleep.  + feelings of worthlessness.  Denies suicidal or homicidal ideation.    Duration of symptoms: She initially states this is the second of two episodes of depression over her lifetime with these symptoms starting after Christmas this year.  Upon further questioning, it is unclear how long these symptoms have been going on for her.  She did report she saw a Dr. Theodoro Clock at [facility] after Christmas who was prescribing Effexor for her.    Impact on function: Reports that it severely impacts her function in multiple ways.    Psychiatric History (include age of onset of first mood disturbance): Initially reports first episode of depression was after the death of her mother in 2001/08/05. Later states she had a "nervous breakdown" in the [place].  Has not worked for about 20 years; on disability for OCD.  - Diagnoses:  Depression.  Recently diagnosed with Bipolar disorder from a hospitalization in [place] but is adamant that she does not have "the devil's disease."     - Treatment (include hospitalizations, pharmacotherapy and outpatient therapy):  Hospitalized four times for mental health reasons including a recent inpatient stay in [place].  Did not elicit a clear counseling history.    Seroquel: Too sleepy Abilify:  Didn't like Latuda: Felt weird Trazodone: Terrible nightmares Wellbutrin: Can't take Zoloft:  Can't take Lexapro: Okay Effexor: Quite effective but stopped working; max dose was 225 mg  Takes Ambien 5  mg a night when she has it.    Denies being tried on:  Paxil, prozac, symbyax, zyprexa (sister takes this).  Family history of psychiatric issues: Remarkable for substance abuse (alcohol dependence) in mom and all three sisters and opioid abuse in one of her three sisters.  Sister Hassan Rowan has been diagnosed with "psychotic depression" and is treated with Zyprexa.  Hassan Rowan has been treated with ECT in the past as well.    Current and history of substance use: Denies use of substances presently or historically.  Medical conditions that might explain or contribute to symptoms: Reviewed problem list.  Noted hypothyroidism.  Last TSH was drawn 10 months ago and was 0.449.  Other: Married two times.  Two children (son and a daughter) and five grandchildren.  Second husband was diagnosed with Bipolar Disorder and cheated on her.  Earned GED.  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  Lives alone.

## 2013-08-28 NOTE — Patient Instructions (Signed)
Please schedule an appointment for June 9th (Tuesday) for 11:00 for COUNSELING. You have a [facility] appointment already scheduled for June 17th at 11:00 to meet Dr. Tammi Klippel and to discuss medications.  You can go to [facility] in the mean time if you feel like you need help before your [facility] appointment. If you can not make either of your appointments with me, please call [phone].
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
## 2013-08-29 DIAGNOSIS — F332 Major depressive disorder, recurrent severe without psychotic features: Secondary | ICD-10-CM | POA: Diagnosis not present

## 2013-09-02 ENCOUNTER — Other Ambulatory Visit: Payer: Self-pay | Admitting: Family Medicine

## 2013-09-11 ENCOUNTER — Ambulatory Visit (INDEPENDENT_AMBULATORY_CARE_PROVIDER_SITE_OTHER): Payer: Medicare Other | Admitting: Psychology

## 2013-09-11 DIAGNOSIS — F39 Unspecified mood [affective] disorder: Secondary | ICD-10-CM | POA: Diagnosis not present

## 2013-09-11 NOTE — Assessment & Plan Note (Addendum)
Report of mood is depressed.  Affect is consistent.  Thoughts remain circumstantial but perhaps a bit more direct today.  She is unkempt - dressed int he same clothes she wore last visit.  States she has never had counseling for any significant period of time in the past.  My guess is she would benefit most from clear behavioral strategies and support.  We tried to generate the former and she was able to come up with a few.  See patient instructions for further plan.    Of note, at the end of our meeting she asked to recite one of her poems and followed it up with a joke.  This was a fairly significant shift from when she presented for therapy.  I did not hammer this point home but it may be an important one to make in the future.  Tried to lay the groundwork for creating small shifts in her mood that she can build on over time.    50 minutes of face to face counseling with the patient.

## 2013-09-11 NOTE — Progress Notes (Signed)
[name] presents for follow-up.  She reported she went to [facility] on 5/27 and was started on Lexapro 10 mg.  She says it has not helped at all and is causing her stomach pains.  She adds that she has chronic stomach issues and has a GI doctor but that the pains are worse on the medicine.  She is considering calling [facility] back to discuss.  Follow-up for them is in August.  She thinks Effexor might be helpful and was especially good at stimulating her appetite - something she thinks she needs.    We talked about her ex-husband Francee Piccolo.  He divorced her in 2003.  She continues to have feelings for him despite the fact he is not always a good friend and was a really bad husband.

## 2013-09-11 NOTE — Patient Instructions (Signed)
You have a [facility] appointment scheduled for June 17th at 11:00.  I need a phone call at [phone] if you are unable to make this appointment. Some things that people with depression do that can help them feel better include: 1.  Moving their body (for example walking) 2. 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 Doing something nice for someone else 4. Keeping a gratitude journal:  Listing at least one thing you are grateful for each day.

## 2013-09-14 DIAGNOSIS — F332 Major depressive disorder, recurrent severe without psychotic features: Secondary | ICD-10-CM | POA: Diagnosis not present

## 2013-09-14 DIAGNOSIS — F330 Major depressive disorder, recurrent, mild: Secondary | ICD-10-CM | POA: Diagnosis not present

## 2013-09-19 ENCOUNTER — Ambulatory Visit: Payer: Medicare Other | Admitting: Psychology

## 2013-09-25 ENCOUNTER — Emergency Department (HOSPITAL_COMMUNITY)
Admission: EM | Admit: 2013-09-25 | Discharge: 2013-09-26 | Disposition: A | Discharge status: Home / Self Care | Payer: Medicare Other | Attending: Emergency Medicine | Admitting: Emergency Medicine

## 2013-09-25 ENCOUNTER — Encounter (HOSPITAL_COMMUNITY): Payer: Self-pay | Admitting: Emergency Medicine

## 2013-09-25 DIAGNOSIS — K219 Gastro-esophageal reflux disease without esophagitis: Secondary | ICD-10-CM | POA: Insufficient documentation

## 2013-09-25 DIAGNOSIS — R011 Cardiac murmur, unspecified: Secondary | ICD-10-CM | POA: Diagnosis not present

## 2013-09-25 DIAGNOSIS — F329 Major depressive disorder, single episode, unspecified: Secondary | ICD-10-CM | POA: Diagnosis not present

## 2013-09-25 DIAGNOSIS — F411 Generalized anxiety disorder: Secondary | ICD-10-CM | POA: Insufficient documentation

## 2013-09-25 DIAGNOSIS — Z8739 Personal history of other diseases of the musculoskeletal system and connective tissue: Secondary | ICD-10-CM | POA: Diagnosis not present

## 2013-09-25 DIAGNOSIS — F3289 Other specified depressive episodes: Secondary | ICD-10-CM | POA: Diagnosis not present

## 2013-09-25 DIAGNOSIS — E079 Disorder of thyroid, unspecified: Secondary | ICD-10-CM | POA: Insufficient documentation

## 2013-09-25 DIAGNOSIS — Z7982 Long term (current) use of aspirin: Secondary | ICD-10-CM | POA: Diagnosis not present

## 2013-09-25 DIAGNOSIS — J449 Chronic obstructive pulmonary disease, unspecified: Secondary | ICD-10-CM | POA: Diagnosis not present

## 2013-09-25 DIAGNOSIS — Z880 Allergy status to penicillin: Secondary | ICD-10-CM | POA: Diagnosis not present

## 2013-09-25 DIAGNOSIS — J4489 Other specified chronic obstructive pulmonary disease: Secondary | ICD-10-CM | POA: Insufficient documentation

## 2013-09-25 DIAGNOSIS — F429 Obsessive-compulsive disorder, unspecified: Secondary | ICD-10-CM | POA: Diagnosis present

## 2013-09-25 DIAGNOSIS — Z79899 Other long term (current) drug therapy: Secondary | ICD-10-CM | POA: Insufficient documentation

## 2013-09-25 DIAGNOSIS — Z8742 Personal history of other diseases of the female genital tract: Secondary | ICD-10-CM | POA: Diagnosis not present

## 2013-09-25 DIAGNOSIS — F319 Bipolar disorder, unspecified: Secondary | ICD-10-CM | POA: Diagnosis present

## 2013-09-25 DIAGNOSIS — Z87891 Personal history of nicotine dependence: Secondary | ICD-10-CM | POA: Insufficient documentation

## 2013-09-25 DIAGNOSIS — R45851 Suicidal ideations: Secondary | ICD-10-CM | POA: Diagnosis not present

## 2013-09-25 LAB — CBC
HCT: 40.5 % (ref 36.0–46.0)
Hemoglobin: 13.6 g/dL (ref 12.0–15.0)
MCH: 32.2 pg (ref 26.0–34.0)
MCHC: 33.6 g/dL (ref 30.0–36.0)
MCV: 96 fL (ref 78.0–100.0)
Platelets: 353 10*3/uL (ref 150–400)
RBC: 4.22 MIL/uL (ref 3.87–5.11)
RDW: 13 % (ref 11.5–15.5)
WBC: 7.2 10*3/uL (ref 4.0–10.5)

## 2013-09-25 LAB — RAPID URINE DRUG SCREEN, HOSP PERFORMED
Amphetamines: NOT DETECTED
Barbiturates: NOT DETECTED
Benzodiazepines: NOT DETECTED
Cocaine: NOT DETECTED
Opiates: NOT DETECTED
Tetrahydrocannabinol: NOT DETECTED

## 2013-09-25 MED ORDER — PROMETHAZINE HCL 25 MG PO TABS
12.5000 mg | ORAL_TABLET | Freq: Once | ORAL | Status: AC
  Administered 2013-09-25: 12.5 mg via ORAL
  Filled 2013-09-25: qty 1

## 2013-09-25 NOTE — ED Notes (Signed)
Per pt report: pt reports having depression since this past christmas. Pt endorses SI without a plan. Pt denies A/V H or HI. Pt a/o x 4.  Skin warm and dry. Pt ambulatory.

## 2013-09-25 NOTE — ED Provider Notes (Signed)
CSN: [id number]     Arrival date & time 09/25/13  2151 History  This chart was scribed for non-physician practitioner Antonietta Breach, PA-C working with Kalman Drape, MD by Eston Mould, ED Scribe. This patient was seen in room WTR4/WLPT4 and the patient's care was started at 11:40 PM .   Chief Complaint  Patient presents with  . Depression  . Suicidal   The history is provided by the patient. No language interpreter was used.   HPI Comments: [name] is a 68 y.o. female with a hx of anxiety, depression and OCD who presents to the Emergency Department complaining of depression and SI. Pt states this is the second time she has ever had a depression episode; states she was in depression for 5 years during her last episode and did not want to receive help at the time; reports having lost weight. States she has been dealing with depression for the past 7 months and reports taking Prozac but denies relief. States when she is depressed, her appetite decreases; she has ate yogurt today due to no appetite. Pt reports SI but denies having a plan. 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 Donzetta Sprung whom placed her on Effexor and reports having an appetite; she believes she needs to be placed back on Effexor for appetite purposes. Denies HI, ETOH, and elicit drug use.  She also c/o dryness of the mouth that began when she began taking Prozac. She is concerned of DM. Denies DM hx.  Past Medical History  Diagnosis Date  . OCD (obsessive compulsive disorder)   . Depression   . Anxiety   . PUD (peptic ulcer disease) 09/2006    H pylori  . Diverticulosis of colon   . GERD (gastroesophageal reflux disease)   . COPD (chronic obstructive pulmonary disease)     Due to long history of tobacco abuse, still smoking  . History of uterine prolapse 10/2004    Dr Cletis Media  . Thyroid disease     Ho R thyroid  noduel plus mild hyperthyroidism. Treated with radioiodine therapty on 04/2006. Dr Estrella Myrtle.  [name] Heart murmur   . Osteoporosis    Past Surgical History  Procedure Laterality Date  . Colonoscopy  06/03/2005    Hyperplastic polyps, sigmoid diverticulosis, internal hemorroids  . Ganglion removal  12/2005    R wrist subretinacular dorsal ganglion  . Biopsy thyroid  08/2008    Atypia and Hurtle cells, partial thyroidectomy   . Tonsillectomy and adenoidectomy  10/2004  . Pft  11/2006    Obstructive pattern.  Poor response to bronchodilators.   . Thyroidectomy, partial  08/2008    Dr Ronnald Collum  . Transthoracic echocardiogram  05/07/1939    Normal systolic function.  EF 55-60%.  Mild MR, atria ok, trivial pericardial effusion  . Carotid ultrasound  09/10/09    No significant extracranial carotid artery stenosis, vertebrals are patent with antegrade flow.   . Mri head  09/20/09    No acute intracranial abn.  Signal abnormality suggestive of chronic small vessel ischemia, maximal in the right subinsular white and deep gray mater.   . Tvt  06/15/10    Tension-free Vaginal Tape, Dr Mancel Bale, for urge incontinence  . Abdominal hysterectomy    . Bladder suspension      mesh sling   Family History  Problem Relation Age of Onset  . Heart disease Mother   .  Heart disease Maternal Grandmother   . Heart disease Maternal Grandfather   . Cirrhosis Sister   . Hypertension Sister   . Other Neg Hx    History  Substance Use Topics  . Smoking status: Former Smoker -- 0.20 packs/day for 35 years    Types: Cigarettes    Quit date: 05/25/2012  . Smokeless tobacco: Never Used     Comment: smokes less than 1 pack per week. ready to quitt.  . Alcohol Use: No   OB History   Grav Para Term Preterm Abortions TAB SAB Ect Mult Living   2         2      Review of Systems  Constitutional: Positive for appetite change.  Psychiatric/Behavioral: Positive for suicidal ideas.    Allergies  Abilify; Amoxicillin;  Ciprofloxacin; Haloperidol lactate; Ketorolac tromethamine; Latuda; Seroquel; and Wellbutrin  Home Medications   Prior to Admission medications   Medication Sig Start Date End Date Taking? Authorizing Provider  Ascorbic Acid (VITAMIN C) 500 MG tablet Take 3,000 mg by mouth daily.    Yes Historical Provider, MD  aspirin 325 MG tablet Take 162.5 mg by mouth See admin instructions. Take 1/2 tablet (162.5 mg) every morning, take an additional 1/2 tablet (162.5 mg) in the evening if needed for pain   Yes Historical Provider, MD  COD LIVER OIL PO Take 1 capsule by mouth daily.   Yes Historical Provider, MD  FLUoxetine (PROZAC) 20 MG capsule Take 1 capsule by mouth daily. 09/14/13  Yes Historical Provider, MD  levothyroxine (SYNTHROID, LEVOTHROID) 75 MCG tablet Take 75 mcg by mouth daily before breakfast.   Yes Historical Provider, MD  lisinopril (PRINIVIL,ZESTRIL) 5 MG tablet Take 5 mg by mouth daily.   Yes Historical Provider, MD  omeprazole (PRILOSEC) 20 MG capsule Take 1 capsule (20 mg total) by mouth daily. 06/08/13  Yes Vance Gather, MD  promethazine (PHENERGAN) 12.5 MG tablet Take 3.125 mg by mouth daily as needed for nausea or vomiting.   Yes Historical Provider, MD  vitamin E 400 UNIT capsule Take 800 Units by mouth daily.   Yes Historical Provider, MD  zolpidem (AMBIEN) 5 MG tablet Take 1 tablet (5 mg total) by mouth at bedtime as needed for sleep. 08/20/13  Yes Lupita Dawn, MD  albuterol (PROVENTIL HFA;VENTOLIN HFA) 108 (90 BASE) MCG/ACT inhaler Inhale 2 puffs into the lungs every 4 (four) hours as needed for wheezing or shortness of breath (OR TIGHTNESS). 08/11/13   Margarita Mail, PA-C   BP 113/70  Pulse 83  Temp(Src) 98 F (36.7 C) (Oral)  Resp 18  SpO2 94%  LMP 01/09/2012  Physical Exam  Nursing note and vitals reviewed. Constitutional: She is oriented to person, place, and time. She appears well-developed and well-nourished. No distress.  HENT:  Head: Normocephalic and atraumatic.   Eyes: Conjunctivae and EOM are normal. No scleral icterus.  Neck: Normal range of motion.  Cardiovascular: Normal rate, regular rhythm and normal heart sounds.   Pulmonary/Chest: Effort normal and breath sounds normal. No respiratory distress. She has no wheezes. She has no rales.  Musculoskeletal: Normal range of motion.  Neurological: She is alert and oriented to person, place, and time.  Skin: Skin is warm and dry. No rash noted. She is not diaphoretic. No erythema. No pallor.  Psychiatric: Her speech is normal and behavior is normal. She exhibits a depressed mood. She expresses suicidal ideation. She expresses no homicidal ideation. She expresses no suicidal plans and no homicidal  plans.    ED Course  Procedures (including critical care time) DIAGNOSTIC STUDIES: Oxygen Saturation is 97% on RA, normal by my interpretation.    COORDINATION OF CARE: 11:55 PM-Discussed treatment plan which includes administer Phenergan while in the ED and psy work-up. Pt agreed to plan.   Labs Review Labs Reviewed  COMPREHENSIVE METABOLIC PANEL - Abnormal; Notable for the following:    Potassium 3.6 (*)    GFR calc non Af Amer 72 (*)    GFR calc Af Amer 84 (*)    All other components within normal limits  SALICYLATE LEVEL - Abnormal; Notable for the following:    Salicylate Lvl <7.4 (*)    All other components within normal limits  ACETAMINOPHEN LEVEL  CBC  ETHANOL  URINE RAPID DRUG SCREEN (HOSP PERFORMED)   Imaging Review No results found.   EKG Interpretation None     MDM   Final diagnoses:  Depression with suicidal ideation    68 year old female with a history of depression and anxiety presents to the emergency department for worsening depression with suicidal thoughts. Patient denies suicidal plan. She denies alcohol or illicit drug use. No HI. Labs reviewed and patient medically cleared. Patient currently pending TTS evaluation. Disposition to be determined by oncoming ED  provider.  I personally performed the services described in this documentation, which was scribed in my presence. The recorded information has been reviewed and is accurate.   Filed Vitals:   09/25/13 2301 09/26/13 0546  BP: 129/78 113/70  Pulse: 74 83  Temp: 98.1 F (36.7 C) 98 F (36.7 C)  TempSrc: Oral Oral  Resp: 20 18  SpO2: 97% 94%     Antonietta Breach, PA-C 09/26/13 [phone]

## 2013-09-26 ENCOUNTER — Encounter (HOSPITAL_COMMUNITY): Payer: Self-pay | Admitting: Psychiatry

## 2013-09-26 DIAGNOSIS — F329 Major depressive disorder, single episode, unspecified: Secondary | ICD-10-CM | POA: Diagnosis not present

## 2013-09-26 DIAGNOSIS — F411 Generalized anxiety disorder: Secondary | ICD-10-CM

## 2013-09-26 LAB — ETHANOL: Alcohol, Ethyl (B): <11 mg/dL (ref 0–11)

## 2013-09-26 LAB — COMPREHENSIVE METABOLIC PANEL
ALT: 10 U/L (ref 0–35)
AST: 15 U/L (ref 0–37)
Albumin: 4.1 g/dL (ref 3.5–5.2)
Alkaline Phosphatase: 91 U/L (ref 39–117)
BUN: 14 mg/dL (ref 6–23)
CO2: 28 mEq/L (ref 19–32)
Calcium: 10.3 mg/dL (ref 8.4–10.5)
Chloride: 100 mEq/L (ref 96–112)
Creatinine, Ser: 0.81 mg/dL (ref 0.50–1.10)
GFR calc Af Amer: 84 mL/min — ABNORMAL LOW (ref >90)
GFR calc non Af Amer: 72 mL/min — ABNORMAL LOW (ref >90)
Glucose, Bld: 98 mg/dL (ref 70–99)
Potassium: 3.6 mEq/L — ABNORMAL LOW (ref 3.7–5.3)
Sodium: 140 mEq/L (ref 137–147)
Total Bilirubin: 0.3 mg/dL (ref 0.3–1.2)
Total Protein: 7 g/dL (ref 6.0–8.3)

## 2013-09-26 LAB — ACETAMINOPHEN LEVEL: Acetaminophen (Tylenol), Serum: <15 ug/mL (ref 10–30)

## 2013-09-26 LAB — SALICYLATE LEVEL: Salicylate Lvl: <2 mg/dL — ABNORMAL LOW (ref 2.8–20.0)

## 2013-09-26 MED ORDER — NICOTINE 21 MG/24HR TD PT24: 21.0000 mg | MEDICATED_PATCH | Freq: Every day | TRANSDERMAL | Status: DC

## 2013-09-26 MED ORDER — LISINOPRIL 5 MG PO TABS
5.0000 mg | ORAL_TABLET | Freq: Every day | ORAL | Status: DC
  Administered 2013-09-26: 5 mg via ORAL
  Filled 2013-09-26: qty 1

## 2013-09-26 MED ORDER — LEVOTHYROXINE SODIUM 75 MCG PO TABS
75.0000 ug | ORAL_TABLET | Freq: Every day | ORAL | Status: DC
  Administered 2013-09-26: 75 ug via ORAL
  Filled 2013-09-26 (×2): qty 1

## 2013-09-26 MED ORDER — VENLAFAXINE HCL ER 150 MG PO CP24
150.0000 mg | ORAL_CAPSULE | Freq: Every day | ORAL | Status: DC
  Administered 2013-09-26: 150 mg via ORAL
  Filled 2013-09-26 (×2): qty 1

## 2013-09-26 MED ORDER — LORAZEPAM 1 MG PO TABS: 1.0000 mg | ORAL_TABLET | Freq: Three times a day (TID) | ORAL | Status: DC | PRN

## 2013-09-26 MED ORDER — ASPIRIN 325 MG PO TABS: 162.5000 mg | ORAL_TABLET | ORAL | Status: DC

## 2013-09-26 MED ORDER — ZOLPIDEM TARTRATE 5 MG PO TABS
5.0000 mg | ORAL_TABLET | Freq: Every evening | ORAL | Status: DC | PRN
  Administered 2013-09-26: 5 mg via ORAL
  Filled 2013-09-26: qty 1

## 2013-09-26 MED ORDER — VENLAFAXINE HCL ER 150 MG PO CP24: 150.0000 mg | ORAL_CAPSULE | Freq: Every day | ORAL | Status: DC

## 2013-09-26 MED ORDER — ONDANSETRON HCL 4 MG PO TABS: 4.0000 mg | ORAL_TABLET | Freq: Three times a day (TID) | ORAL | Status: DC | PRN

## 2013-09-26 MED ORDER — PANTOPRAZOLE SODIUM 40 MG PO TBEC
40.0000 mg | DELAYED_RELEASE_TABLET | Freq: Every day | ORAL | Status: DC
  Filled 2013-09-26: qty 1

## 2013-09-26 MED ORDER — ACETAMINOPHEN 325 MG PO TABS: 650.0000 mg | ORAL_TABLET | ORAL | Status: DC | PRN

## 2013-09-26 MED ORDER — ALUM & MAG HYDROXIDE-SIMETH 200-200-20 MG/5ML PO SUSP: 30.0000 mL | ORAL | Status: DC | PRN

## 2013-09-26 MED ORDER — ALBUTEROL SULFATE HFA 108 (90 BASE) MCG/ACT IN AERS: 2.0000 | INHALATION_SPRAY | RESPIRATORY_TRACT | Status: DC | PRN

## 2013-09-26 NOTE — Progress Notes (Signed)
Patient being provided with cab voucher home.  Tilden Fossa, MSW, [facility] Clinical Social Worker [employer] [phone]

## 2013-09-26 NOTE — ED Provider Notes (Signed)
Medical screening examination/treatment/procedure(s) were performed by non-physician practitioner and as supervising physician I was immediately available for consultation/collaboration.   EKG Interpretation None       Kalman Drape, MD 09/26/13 [phone]

## 2013-09-26 NOTE — [facility] Suicide Risk Assessment (Signed)
Suicide Risk Assessment  Discharge Assessment     Demographic Factors:  Caucasian and Unemployed  Total Time spent with patient: 20 minutes  Psychiatric Specialty Exam:     Blood pressure 110/68, pulse 103, temperature 97.8 F (36.6 C), temperature source Oral, resp. rate 16, last menstrual period 01/09/2012, SpO2 94.00%.There is no weight on file to calculate BMI.  General Appearance: Casual  Eye Contact::  Good  Speech:  Normal Rate  Volume:  Normal  Mood:  Depressed  Affect:  Congruent  Thought Process:  Coherent  Orientation:  Full (Time, Place, and Person)  Thought Content:  WDL  Suicidal Thoughts:  No  Homicidal Thoughts:  No  Memory:  Immediate;   Good Recent;   Good Remote;   Good  Judgement:  Good  Insight:  Good  Psychomotor Activity:  Decreased  Concentration:  Good  Recall:  Good  Fund of Knowledge:Good  Language: Good  Akathisia:  No  Handed:  Right  AIMS (if indicated):     Assets:  Desire for Improvement Financial Resources/Insurance Housing Leisure Time Resilience Transportation  Sleep:       Musculoskeletal: Strength & Muscle Tone: within normal limits Gait & Station: normal Patient leans: N/A   Mental Status Per Nursing Assessment::   On Admission:   Depression  Current Mental Status by Physician: NA  Loss Factors: NA  Historical Factors: NA  Risk Reduction Factors:   Sense of responsibility to family, Positive therapeutic relationship and Positive coping skills or problem solving skills  Continued Clinical Symptoms:  Depression, anxiety  Cognitive Features That Contribute To Risk:  None    Suicide Risk:  Minimal: No identifiable suicidal ideation.  Patients presenting with no risk factors but with morbid ruminations; may be classified as minimal risk based on the severity of the depressive symptoms  Discharge Diagnoses:   AXIS I:  Anxiety Disorder NOS, Major depression AXIS II:  Deferred AXIS III:   Past Medical  History  Diagnosis Date  . OCD (obsessive compulsive disorder)   . Depression   . Anxiety   . PUD (peptic ulcer disease) 09/2006    H pylori  . Diverticulosis of colon   . GERD (gastroesophageal reflux disease)   . COPD (chronic obstructive pulmonary disease)     Due to long history of tobacco abuse, still smoking  . History of uterine prolapse 10/2004    Dr Cletis Media  . Thyroid disease     Ho R thyroid noduel plus mild hyperthyroidism. Treated with radioiodine therapty on 04/2006. Dr Estrella Myrtle.  [name] Heart murmur   . Osteoporosis    AXIS IV:  other psychosocial or environmental problems, problems related to social environment and problems with primary support group AXIS V:  61-70 mild symptoms  Plan Of Care/Follow-up recommendations:  Activity:  as tolearted Diet:  low-sodium heart healthy diet  Is patient on multiple antipsychotic therapies at discharge:  No   Has Patient had three or more failed trials of antipsychotic monotherapy by history:  No  Recommended Plan for Multiple Antipsychotic Therapies: NA    LORD, JAMISON, PMH-NP 09/26/2013, 10:40 AM
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
## 2013-09-27 NOTE — Consult Note (Signed)
Face to face evaluation and I agree with this note 

## 2013-10-12 ENCOUNTER — Other Ambulatory Visit: Payer: Self-pay | Admitting: Family Medicine

## 2013-10-19 DIAGNOSIS — F332 Major depressive disorder, recurrent severe without psychotic features: Secondary | ICD-10-CM | POA: Diagnosis not present

## 2013-11-21 DIAGNOSIS — F332 Major depressive disorder, recurrent severe without psychotic features: Secondary | ICD-10-CM | POA: Diagnosis not present

## 2013-11-23 ENCOUNTER — Encounter: Payer: Self-pay | Admitting: Family Medicine

## 2013-11-23 ENCOUNTER — Ambulatory Visit (INDEPENDENT_AMBULATORY_CARE_PROVIDER_SITE_OTHER): Payer: Medicare Other | Admitting: Family Medicine

## 2013-11-23 VITALS — BP 144/79 | HR 94 | Temp 98.1°F | Ht <= 58 in | Wt 108.3 lb

## 2013-11-23 DIAGNOSIS — E039 Hypothyroidism, unspecified: Secondary | ICD-10-CM

## 2013-11-23 NOTE — Assessment & Plan Note (Signed)
Pt C/O fatigue and depression.  She was sent from her psychiatrist for lab work today.  Will obtain TSH as last TSH in system was from 10/30/12.  Denies any SI/HI today, no plan, and lives with good support, no guns in the house as well.

## 2013-11-23 NOTE — Progress Notes (Signed)
[name] is a 68 y.o. female who presents today for depression.  Depression - She has seen her psychiatrist about 3-4 days ago now, started on abilify.  He wanted her to get her thyroid check.  She denies any current SI/HI but has noticed some weakness/fatigue.  Denies any plan for suicide, guns in home, and lives one floor above her sister in an apartment.   Past Medical History  Diagnosis Date  . OCD (obsessive compulsive disorder)   . Depression   . Anxiety   . PUD (peptic ulcer disease) 09/2006    H pylori  . Diverticulosis of colon   . GERD (gastroesophageal reflux disease)   . COPD (chronic obstructive pulmonary disease)     Due to long history of tobacco abuse, still smoking  . History of uterine prolapse 10/2004    Dr Cletis Media  . Thyroid disease     Ho R thyroid noduel plus mild hyperthyroidism. Treated with radioiodine therapty on 04/2006. Dr Estrella Myrtle.  [name] Heart murmur   . Osteoporosis     History  Smoking status  . Former Smoker -- 0.20 packs/day for 35 years  . Types: Cigarettes  . Quit date: 05/25/2012  Smokeless tobacco  . Never Used    Comment: smokes less than 1 pack per week. ready to quitt.    Family History  Problem Relation Age of Onset  . Heart disease Mother   . Heart disease Maternal Grandmother   . Heart disease Maternal Grandfather   . Cirrhosis Sister   . Hypertension Sister   . Other Neg Hx     Current Outpatient Prescriptions on File Prior to Visit  Medication Sig Dispense Refill  . albuterol (PROVENTIL HFA;VENTOLIN HFA) 108 (90 BASE) MCG/ACT inhaler Inhale 2 puffs into the lungs every 4 (four) hours as needed for wheezing or shortness of breath (OR TIGHTNESS).  1 Inhaler  0  . aspirin 325 MG tablet Take 162.5 mg by mouth See admin instructions. Take 1/2 tablet (162.5 mg) every morning, take an additional 1/2 tablet (162.5 mg) in the evening if needed for pain      . levothyroxine (SYNTHROID, LEVOTHROID) 75 MCG tablet Take 1 tablet (75 mcg total)  by mouth daily before breakfast.      . lisinopril (PRINIVIL,ZESTRIL) 5 MG tablet Take 1 tablet (5 mg total) by mouth daily.      [name] lisinopril (PRINIVIL,ZESTRIL) 5 MG tablet TAKE 1 TABLET (5 MG TOTAL) BY MOUTH DAILY.  30 tablet  6  . omeprazole (PRILOSEC) 20 MG capsule Take 1 capsule (20 mg total) by mouth daily.  30 capsule  3  . promethazine (PHENERGAN) 12.5 MG tablet Take 3.125 mg by mouth daily as needed for nausea or vomiting.      . venlafaxine XR (EFFEXOR-XR) 150 MG 24 hr capsule Take 1 capsule (150 mg total) by mouth daily with breakfast.  30 capsule  0  . vitamin C (ASCORBIC ACID) 500 MG tablet Take 6 tablets (3,000 mg total) by mouth daily.      . vitamin E 400 UNIT capsule Take 2 capsules (800 Units total) by mouth daily.      [name] zolpidem (AMBIEN) 5 MG tablet Take 1 tablet (5 mg total) by mouth at bedtime as needed for sleep.  10 tablet  0   No current facility-administered medications on file prior to visit.    ROS: Per HPI.  All other systems reviewed and are negative.   Physical Exam Filed Vitals:  11/23/13 1501  BP: 144/79  Pulse: 94  Temp: 98.1 F (36.7 C)    Physical Examination: General appearance - alert, well appearing, and in no distress Mouth - mucous membranes moist, pharynx normal without lesions, no glossitis  Neck - thyroid exam: thyroid is normal in size without nodules or tenderness Skin - no drying of the skin, thinning of the hair    Chemistry      Component Value Date/Time   NA 140 09/25/2013 2321   K 3.6* 09/25/2013 2321   CL 100 09/25/2013 2321   CO2 28 09/25/2013 2321   BUN 14 09/25/2013 2321   CREATININE 0.81 09/25/2013 2321   CREATININE 0.75 05/17/2013 1701      Component Value Date/Time   CALCIUM 10.3 09/25/2013 2321   ALKPHOS 91 09/25/2013 2321   AST 15 09/25/2013 2321   ALT 10 09/25/2013 2321   BILITOT 0.3 09/25/2013 2321      Lab Results  Component Value Date   WBC 7.2 09/25/2013   HGB 13.6 09/25/2013   HCT 40.5 09/25/2013   MCV 96.0  09/25/2013   PLT 353 09/25/2013   Lab Results  Component Value Date   TSH 0.499 10/30/2012

## 2013-11-23 NOTE — Patient Instructions (Signed)
We will send you results for your thyroid.  Thanks, Dr. Awanda Mink

## 2013-11-24 LAB — TSH: TSH: 1.806 u[IU]/mL (ref 0.350–4.500)

## 2013-12-04 ENCOUNTER — Encounter: Payer: Self-pay | Admitting: *Deleted

## 2013-12-04 NOTE — Progress Notes (Signed)
Pt in nurse clinic requesting most recent thyroid level result to take her appointment at [facility].  Pt signed release of information and copy of TSH given.  Derl Barrow, RN

## 2013-12-05 ENCOUNTER — Ambulatory Visit (INDEPENDENT_AMBULATORY_CARE_PROVIDER_SITE_OTHER): Payer: Medicare Other | Admitting: Family Medicine

## 2013-12-05 VITALS — BP 128/68 | HR 112 | Temp 97.5°F | Ht <= 58 in | Wt 110.0 lb

## 2013-12-05 DIAGNOSIS — L821 Other seborrheic keratosis: Secondary | ICD-10-CM

## 2013-12-05 DIAGNOSIS — B070 Plantar wart: Secondary | ICD-10-CM

## 2013-12-05 NOTE — Progress Notes (Signed)
   This is a Dermatology Clinic visit.  Subjective:    Patient ID: [name], female    DOB: 12-05-1945, 68 y.o.   MRN: [id number]  HPI  Skin Lesion on Right Lower Leg: - First noticed about 1 month ago, a "small mole" on Right lower leg, center front of her shin. Denies any bleeding, irritation, redness. States that it has not changed size in 1 month. Concerned about the irregular borders, so she presented today for evaluation. Has had multiple skin lesions frozen off in past. - Denies significant sun exposure. No history of skin cancer.  Calluses, bilateral feet: - Reports gradual worsening history with "painful tough skin" on bottoms of both feet over several months, large spot on middle of Left Foot, and smaller more painful spots on side of Right foot. Admits to some pain and discomfort with ambulation and weight bearing on these points. - Denies any injury, trauma, fall, bleeding from calluses, fevers, redness  I have reviewed and updated the following as appropriate: allergies and current medications  Social Hx: Former smoker  Review of Systems  See above HPI     Objective:   Physical Exam  BP 128/68  Pulse 112  Temp(Src) 97.5 F (36.4 C) (Oral)  Ht 4\' 9"  (1.448 m)  Wt 110 lb (49.896 kg)  BMI 23.80 kg/m2  LMP 01/09/2012  Gen - well-appearing, conversational, NAD Skin - Right Lower Ext: anterior mid shin 1 x 1 cm light brown raised lesion with definate raised edges consistent with seborrheic keratosis, non-tender, no surround erythema or open lesion, comparable to multiple generalized seborrheic keratoses on both arms and face. Ext - Bilateral Feet Soles: Left foot mid plantar with 2x2cm thick dry callus with central area of firmness consistent with plantar wart. Similar appearance with two smaller lesions on plantar surface of Right foot on lateral edge. Non-tender, no edema, peripheral pulses intact +2 b/l  PROCEDURE NOTE: Cryotherapy procedure performed on Right  Lower Ext seborrheic keratosis. Informed consent obtained, patient made aware of risks and benefits of procedure, signed consent. Using cannister of liquid nitrogen, sprayed directly on top of lesion until surrounding area white up to 8 rounds of treatment performed. Upon completion skin immediately surrounding lesion with appropriate erythematous appearance. Post-procedure care information given to patient.      Assessment & Plan:   See specific A&P problem list for details.

## 2013-12-05 NOTE — Assessment & Plan Note (Signed)
Consistent with bilateral plantar warts, characteristic thick callus formed with central firm point interrupting skin pattern - No evidence of bleeding or infection  Plan: 1. Advised routine nightly soaking, filing with emory board, recommended OTC Salicylic acid topical, continue for weeks to month to remove 2. If not improving, advised to call previously established Podiatrist for appointment

## 2013-12-05 NOTE — Assessment & Plan Note (Addendum)
Consistent with RLE anterior 1 x 1 cm seborrheic keratosis - Lesion not consistent with nevus, no concern for melanoma  Plan: 1. Performed Cryotherapy, see procedure note for details 2. Handout given for post-procedure care, anticipate heal in 2 weeks 3. F/u PRN

## 2013-12-05 NOTE — Patient Instructions (Addendum)
Dear [name], Thank you for coming in to the Dermatology Clinic today.  1. The spot on your Right leg is most likely a "Seborrheic Dermatosis", which is a benign, normal skin finding that is common as we get older, it is similar to the ones on your arms, but his one is just raised and larger. It is not concerning for a melanoma, or high risk skin finding. We do not need to take a sample of this one. It was frozen off today. 2. For your calluses, these may be caused by "Plantar Warts", commonly by a virus that causes the thick callus formation. These can definitely be painful. 3. Recommend to soak your feet nightly, and use an [employer] to file down the thickened skin, continue to do this, and you may try the Over the Counter - Salicylic Acid or wart-removal topical solution, use this nightly after soaking, and cover with a bandage. This process may take several weeks to months. 4. In addition, we will also submit a referral to a Podiatrist to follow-up your Calluses  Please schedule a follow-up appointment with Dr. Beverlyn Roux (new regular doctor) within 1 month to follow-up.  If you have any other questions or concerns, please feel free to call the clinic to contact me. You may also schedule an earlier appointment if necessary.  However, if your symptoms get significantly worse, please go to the Emergency Department to seek immediate medical attention.  Nobie Putnam, DO [facility]   Cryosurgery for Skin Conditions, Care After Refer to this sheet in the next few weeks. These instructions provide you with information on caring for yourself after your procedure. Your health care provider may also give you more specific instructions. Your treatment has been planned according to current medical practices, but problems sometimes occur. Call your health care provider if you have any problems or questions after your procedure. WHAT TO EXPECT AFTER THE PROCEDURE After your  procedure, it is typical to have the following:  The treated area will become red and swollen shortly after the procedure.  Within 2-3 days, a blister will form over the treated area. The blister may contain a small amount of blood.  In about 2 weeks, the blister will break on its own, leaving a scab. The treated area will then heal. After healing, there is usually little or no scarring. HOME CARE INSTRUCTIONS   Keep the treated area clean, dry, and covered with a bandage until healed. The area can be cleaned as usual with soap and water.  You may take showers. If your bandage gets wet, change it right away.  Do not pick at your blister or try to break it open. This can cause infection and scarring.  Do not apply any medicine, cream, or lotion to the treated area unless directed to do so by your health care provider. SEEK MEDICAL CARE IF:   You have increased pain, swelling, redness, fluid drainage, or bleeding in the treated area.  Your blister becomes large and painful. Document Released: 10/09/2004 Document Revised: 11/22/2012 Document Reviewed: 10/20/2012 [facility] Patient Information 2015 [place], [place]. This information is not intended to replace advice given to you by your health care provider. Make sure you discuss any questions you have with your health care provider.  Plantar Warts Plantar warts are growths on the bottom of your foot. Warts are caused by a germ.  HOME CARE  Soak your foot in warm water. Dry your foot when you are done. Remove  the top layer of softened skin, then apply any medicine as told by your doctor.  Remove any bandages daily. File off extra wart tissue. Repeat this as told by your doctor until the wart goes away.  Only use medicine as told by your doctor.  Use a bandage with a hole in it (doughnut bandage) to relieve pain. Put the hole over the wart.  Wear shoes and socks and change them daily.  Keep your foot clean and dry.  Check your feet  regularly.  Avoid contact with warts on other people.  Have your warts checked by your doctor. GET HELP RIGHT AWAY IF: The treated skin becomes red, puffy (swollen), or painful. MAKE SURE YOU:  Understand these instructions.  Will watch your condition.  Will get help right away if you are not doing well or get worse. Document Released: 04/24/2010 Document Revised: 08/06/2013 Document Reviewed: 04/24/2010 [facility] Patient Information 2015 [place], [place]. This information is not intended to replace advice given to you by your health care provider. Make sure you discuss any questions you have with your health care provider.

## 2013-12-19 ENCOUNTER — Ambulatory Visit (INDEPENDENT_AMBULATORY_CARE_PROVIDER_SITE_OTHER): Payer: Medicare Other | Admitting: Family Medicine

## 2013-12-19 ENCOUNTER — Encounter: Payer: Self-pay | Admitting: Family Medicine

## 2013-12-19 VITALS — BP 126/87 | HR 106 | Ht <= 58 in | Wt 110.0 lb

## 2013-12-19 DIAGNOSIS — B070 Plantar wart: Secondary | ICD-10-CM

## 2013-12-19 NOTE — Patient Instructions (Signed)
Your foot pain was from a plantar wart. It is caused by a virus called HPV. We scraped off the dead skin (callous) with a blade and froze the skin below. This will need to be repeated at least one more time 3 weeks from now, and may require more treatments after that.   Please schedule an appointment for repeat plantar wart removal in 3 weeks with Dr. Lamar Benes

## 2013-12-21 DIAGNOSIS — B070 Plantar wart: Secondary | ICD-10-CM | POA: Insufficient documentation

## 2013-12-21 NOTE — Progress Notes (Signed)
Patient ID: [name], female   DOB: 1946/04/02, 68 y.o.   MRN: [id number]   Subjective:    Patient ID: [name], female    DOB: 20-Jul-1945, 68 y.o.   MRN: [id number]  HPI  CC: right foot pain  # Right foot pain:  Started about 1 year ago, progressively getting worse  Pain when standing/bearing weight, improved when pressure taken off  Located outside bottom foot  Thinks she needs a steroid injection ROS: no fevers/chills, no redness or swelling of the skin  Review of Systems   See HPI for ROS. All other systems reviewed and are negative.  Past medical history, surgical, family, and social history reviewed and updated in the EMR. No new updates were made today. Objective:  BP 126/87  Pulse 106  Ht 4\' 9"  (1.448 m)  Wt 110 lb (49.896 kg)  BMI 23.80 kg/m2  LMP 01/09/2012 Vitals reviewed  General: NAD Ext: right plantar surface of lateral foot with 66mm plantar wart and surrounding callous. See picture   PROCEDURE NOTE 12/19/2013 Right plantar wart removal. Written informed consent obtained, discussed risks/benefits of procedure including bleeding, infection, pain. Patient agreed to continue. Area was cleaned with alcohol pad. A #15 blade was used to gently scrape away the overlying callous and expose the wart. 3 rounds of liquid nitrogen were then applied to the wart. The area was then bandaged with gauze and bandaid. Patient tolerated the procedure well.  Assessment & Plan:  See Problem List Documentation

## 2014-01-03 ENCOUNTER — Other Ambulatory Visit: Payer: Self-pay | Admitting: Family Medicine

## 2014-01-09 ENCOUNTER — Ambulatory Visit: Payer: Self-pay | Admitting: Family Medicine

## 2014-01-16 DIAGNOSIS — F332 Major depressive disorder, recurrent severe without psychotic features: Secondary | ICD-10-CM | POA: Diagnosis not present

## 2014-01-23 ENCOUNTER — Ambulatory Visit (INDEPENDENT_AMBULATORY_CARE_PROVIDER_SITE_OTHER): Payer: Medicare Other | Admitting: Family Medicine

## 2014-01-23 VITALS — BP 118/78 | HR 177 | Temp 98.3°F | Ht <= 58 in | Wt 113.9 lb

## 2014-01-23 DIAGNOSIS — K219 Gastro-esophageal reflux disease without esophagitis: Secondary | ICD-10-CM

## 2014-01-23 DIAGNOSIS — B070 Plantar wart: Secondary | ICD-10-CM | POA: Diagnosis present

## 2014-01-23 MED ORDER — PANTOPRAZOLE SODIUM 20 MG PO TBEC: 20.0000 mg | DELAYED_RELEASE_TABLET | Freq: Every day | ORAL | Status: DC

## 2014-01-23 NOTE — Patient Instructions (Signed)
If you are still having some pain with your foot, return to clinic in 2-3 weeks for another treatment.  For your eye, try using a lubricating eye drop. Brand names are Systane, Refresh.

## 2014-01-23 NOTE — Assessment & Plan Note (Addendum)
RIGHT sided plantar warts: Debridement #2. Additional wart noted next to the first, this was also debrided and cryotherapy today. If still having pain, repeat treatment in 2-3 weeks.

## 2014-01-23 NOTE — Assessment & Plan Note (Signed)
Sx unrelieved by omeprazole. Will try protonix 20mg  daily. F/u as needed.

## 2014-01-23 NOTE — Progress Notes (Signed)
Patient ID: [name], female   DOB: [birth year], 68 y.o.   MRN: [id number]   Subjective:    Patient ID: [name], female    DOB: [birth year], 68 y.o.   MRN: [id number]  HPI  CC: right foot wart  # Right plantar wart:  Pain is much improved since last visit  Still having some pain in upper leg  Ready for additional treatment  # Heartburn  Still having some heart burn, omeprazole has not helped  Review of Systems   See HPI for ROS. All other systems reviewed and are negative.  Past medical history, surgical, family, and social history reviewed and updated in the EMR as appropriate. Objective:  BP 118/78  Pulse 177  Temp(Src) 98.3 F (36.8 C) (Oral)  Ht 4\' 9"  (1.448 m)  Wt 113 lb 14.4 oz (51.665 kg)  BMI 24.64 kg/m2  LMP 01/09/2012 Vitals reviewed  General: NAD Ext: right plantar wart, lateral aspect of foot x 3  Procedure Note Right plantar wart removal cryotherapy Written informed consent obtained, signed by patient in chart. Discussed possibility of pain, bleeding, infection. Area cleaned with alcohol swabs x 3. A #15 blade used to debride overlying callous skin. No bleeding. Liquid nitrogen spray x 3. Gauze pad and bandaid placed over area. Patient tolerated procedure well.  Assessment & Plan:  See Problem List Documentation

## 2014-02-04 ENCOUNTER — Encounter: Payer: Self-pay | Admitting: Family Medicine

## 2014-02-12 ENCOUNTER — Ambulatory Visit: Payer: Self-pay | Admitting: Family Medicine

## 2014-02-14 ENCOUNTER — Ambulatory Visit: Payer: Self-pay | Admitting: Family Medicine

## 2014-04-09 DIAGNOSIS — H524 Presbyopia: Secondary | ICD-10-CM | POA: Diagnosis not present

## 2014-04-09 DIAGNOSIS — H2512 Age-related nuclear cataract, left eye: Secondary | ICD-10-CM | POA: Diagnosis not present

## 2014-04-09 DIAGNOSIS — Z961 Presence of intraocular lens: Secondary | ICD-10-CM | POA: Diagnosis not present

## 2014-04-09 DIAGNOSIS — H25012 Cortical age-related cataract, left eye: Secondary | ICD-10-CM | POA: Diagnosis not present

## 2014-04-16 DIAGNOSIS — R1310 Dysphagia, unspecified: Secondary | ICD-10-CM | POA: Diagnosis not present

## 2014-04-16 DIAGNOSIS — K5730 Diverticulosis of large intestine without perforation or abscess without bleeding: Secondary | ICD-10-CM | POA: Diagnosis not present

## 2014-04-16 DIAGNOSIS — K219 Gastro-esophageal reflux disease without esophagitis: Secondary | ICD-10-CM | POA: Diagnosis not present

## 2014-04-17 DIAGNOSIS — F332 Major depressive disorder, recurrent severe without psychotic features: Secondary | ICD-10-CM | POA: Diagnosis not present

## 2014-04-26 ENCOUNTER — Ambulatory Visit: Payer: Self-pay | Admitting: Family Medicine

## 2014-04-30 ENCOUNTER — Ambulatory Visit (INDEPENDENT_AMBULATORY_CARE_PROVIDER_SITE_OTHER): Payer: Medicare Other | Admitting: Family Medicine

## 2014-04-30 NOTE — Progress Notes (Signed)
   Subjective:    Patient ID: [name], female    DOB: 1945/08/17, 69 y.o.   MRN: [id number]  HPI    Review of Systems     Objective:   Physical Exam        Assessment & Plan:    Pt showed up for appointment and left prior to being seen.

## 2014-05-06 ENCOUNTER — Encounter: Payer: Self-pay | Admitting: *Deleted

## 2014-05-06 NOTE — Progress Notes (Signed)
Prior Authorization received from [facility] for promethazine 25 mg. Formulary and PA form placed in provider box for completion. Derl Barrow, RN

## 2014-05-07 ENCOUNTER — Encounter: Payer: Self-pay | Admitting: Family Medicine

## 2014-05-07 DIAGNOSIS — F489 Nonpsychotic mental disorder, unspecified: Secondary | ICD-10-CM | POA: Insufficient documentation

## 2014-05-08 NOTE — Progress Notes (Signed)
PA approved for promethazine 25 mg tablet though [employer] 04/05/2014-04/05/2015.  [facility] pharmacy aware.  Derl Barrow, RN

## 2014-05-14 ENCOUNTER — Ambulatory Visit (INDEPENDENT_AMBULATORY_CARE_PROVIDER_SITE_OTHER): Payer: Medicare Other | Admitting: Family Medicine

## 2014-05-14 ENCOUNTER — Encounter: Payer: Self-pay | Admitting: Family Medicine

## 2014-05-14 VITALS — BP 170/78 | HR 88 | Temp 98.0°F | Ht <= 58 in | Wt 116.8 lb

## 2014-05-14 DIAGNOSIS — IMO0001 Reserved for inherently not codable concepts without codable children: Secondary | ICD-10-CM

## 2014-05-14 DIAGNOSIS — E041 Nontoxic single thyroid nodule: Secondary | ICD-10-CM

## 2014-05-14 DIAGNOSIS — R030 Elevated blood-pressure reading, without diagnosis of hypertension: Secondary | ICD-10-CM

## 2014-05-14 DIAGNOSIS — B079 Viral wart, unspecified: Secondary | ICD-10-CM

## 2014-05-14 NOTE — Patient Instructions (Signed)
Thank you for coming in, today!  I don't feel anything definitely worrisome in your throat, today. We will repeat your ultrasound to make sure everything looks okay. Someone will call you or I'll send you a letter with the result.  For your wart, try something like Compound W over the counter. If that doesn't help, come back to see [place] about it.  Come back to see Dr. Sherril Cong sometime in the next few weeks. You can ask her about any other problems you have. You can also talk to her about your blood pressure.  Please feel free to call with any questions or concerns at any time, at [phone]. --Dr. Venetia Maxon

## 2014-05-14 NOTE — Progress Notes (Signed)
   Subjective:    Patient ID: [name], female    DOB: 09-Oct-1945, 69 y.o.   MRN: [id number]  HPI: Pt presents to clinic for SDA for nodules in her neck. She is s/p left thyroidectomy and has right thyroid and isthmus nodules. She feels like now she has a cyst on the left of her anterior neck for about 1 month. The last time she had an ultrasound of her neck was October of 2014, and at that time the nodules were stable. She reports the cyst is firm and nontender. She reports nausea for several months but she has no weight loss, fevers, or pain in her neck. She has tried to eat healthier lately with less meat, more fiber, etc.  Review of Systems: As above. Pt does report some "swellings" on her head and the base of her neck. Noted pt's blood pressure but she denies chest pain, SOB, cough, or LE edema; see A&P.     Objective:   Physical Exam BP 182/110 mmHg  Pulse 88  Temp(Src) 98 F (36.7 C) (Oral)  Ht 4\' 9"  (1.448 m)  Wt 116 lb 12.8 oz (52.98 kg)  BMI 25.27 kg/m2  LMP 01/09/2012 Manual recheck BP 170/78 Gen: well-appearing elderly female in NAD HEENT: [place]/AT, EOMI, PERRLA, TM's clear  Nasal and posterior oropharyngeal mucosae clear  Single left-sided, small occipital lymph node palpated, minimally tender to direct palpation Neck: supple, full ROM  Thyroid nontender, no right-sided nodules palpable  Left thyroid lobe surgically absent  Left thyroid cartilage of trachea palpable in area indicated by pt as the area of her "hard cyst" per HPI Cardio: RRR, no murmur Pulm: CTAB, no wheezes Abd: soft, nontender, BS+ Wart present to left 2nd digit, no ulceration / bleeding / drainage tenderness      Assessment & Plan:  69yo female with history of thyroid nodule on the right and in the isthmus, s/p left thyroidectomy, concerned for nodules and need for f/u imaging - NO signs / symptoms / history items concerning for frank malignancy or hyper- / hypothyroidism - last TSH about 6 months  ago was well within normal limits - last thyroid ultrasound 01/2013 with stable right and isthmus nodules noted  Plan:  - repeat thyroid ultrasound scheduled today; will f/u result and communicate any further recommendations to pt as appropriate - reassured her that the "cyst" she felt appears to be normal cartilage to my exam - advised f/u with PCP for further questions regarding thyroid issues  Of note, at the end of the visit, pt mentions several other complaints, including a wart to her finger, a callus to her foot, and sensation of pressure in her bladder. - suggested Compound W or other OTC product for wart and to return to clinic specifically for this if needed - recommended f/u with PCP for other issues as today focused on her thyroid - strongly recommended f/u with PCP for her blood pressure and reviewed red flags that would prompt presentation to the ED  Emmaline Kluver, MD PGY-3, [facility] 05/14/2014, 8:51 PM

## 2014-05-17 ENCOUNTER — Ambulatory Visit: Payer: Self-pay | Admitting: Family Medicine

## 2014-05-17 ENCOUNTER — Ambulatory Visit: Payer: Self-pay | Admitting: Student

## 2014-05-17 ENCOUNTER — Ambulatory Visit (HOSPITAL_COMMUNITY)
Admission: RE | Admit: 2014-05-17 | Discharge: 2014-05-17 | Disposition: A | Discharge status: Home / Self Care | Payer: Medicare Other | Source: Ambulatory Visit | Attending: Family Medicine | Admitting: Family Medicine

## 2014-05-17 DIAGNOSIS — E041 Nontoxic single thyroid nodule: Secondary | ICD-10-CM

## 2014-05-20 ENCOUNTER — Ambulatory Visit (HOSPITAL_COMMUNITY)
Admission: RE | Admit: 2014-05-20 | Discharge: 2014-05-20 | Disposition: A | Discharge status: Home / Self Care | Payer: Medicare Other | Source: Ambulatory Visit | Attending: Family Medicine | Admitting: Family Medicine

## 2014-05-20 DIAGNOSIS — E042 Nontoxic multinodular goiter: Secondary | ICD-10-CM | POA: Insufficient documentation

## 2014-05-20 DIAGNOSIS — E041 Nontoxic single thyroid nodule: Secondary | ICD-10-CM | POA: Diagnosis present

## 2014-05-20 DIAGNOSIS — Z9089 Acquired absence of other organs: Secondary | ICD-10-CM | POA: Diagnosis not present

## 2014-05-21 ENCOUNTER — Telehealth: Payer: Self-pay | Admitting: Family Medicine

## 2014-05-21 NOTE — Telephone Encounter (Signed)
Pt called because she has a [place] yesterday and would like Dr. Venetia Maxon to call her at her sisters home [phone]. If no answer it is okay to leave a message. jw

## 2014-05-21 NOTE — Telephone Encounter (Signed)
Red Team: Please call pt to let her know her ultrasound did not show any changes to her thyroid (she does still have small nodules in her right lobe of the thyroid, unchanged from 2 years ago). She should follow up with Dr. Sherril Cong as needed.  Attempted to call pt, myself, but got a message stating person at that number was unavailable. FYI to Dr. Sherril Cong.  Emmaline Kluver, MD PGY-3, [facility] 05/21/2014, 3:28 PM

## 2014-05-21 NOTE — Telephone Encounter (Signed)
Tried calling (see previous phone note from PCP). Patient picked up phone then hung up. Will try again tomm.

## 2014-05-22 DIAGNOSIS — H40013 Open angle with borderline findings, low risk, bilateral: Secondary | ICD-10-CM | POA: Diagnosis not present

## 2014-05-22 DIAGNOSIS — H2512 Age-related nuclear cataract, left eye: Secondary | ICD-10-CM | POA: Diagnosis not present

## 2014-05-22 DIAGNOSIS — Z961 Presence of intraocular lens: Secondary | ICD-10-CM | POA: Diagnosis not present

## 2014-05-22 DIAGNOSIS — H25012 Cortical age-related cataract, left eye: Secondary | ICD-10-CM | POA: Diagnosis not present

## 2014-05-27 ENCOUNTER — Encounter: Payer: Self-pay | Admitting: Family Medicine

## 2014-05-27 ENCOUNTER — Ambulatory Visit (INDEPENDENT_AMBULATORY_CARE_PROVIDER_SITE_OTHER): Payer: Medicare Other | Admitting: Family Medicine

## 2014-05-27 VITALS — BP 126/84 | HR 96 | Temp 98.1°F | Ht <= 58 in | Wt 116.0 lb

## 2014-05-27 DIAGNOSIS — I868 Varicose veins of other specified sites: Secondary | ICD-10-CM

## 2014-05-27 DIAGNOSIS — J209 Acute bronchitis, unspecified: Secondary | ICD-10-CM | POA: Diagnosis not present

## 2014-05-27 DIAGNOSIS — E041 Nontoxic single thyroid nodule: Secondary | ICD-10-CM

## 2014-05-27 DIAGNOSIS — L84 Corns and callosities: Secondary | ICD-10-CM

## 2014-05-27 DIAGNOSIS — B079 Viral wart, unspecified: Secondary | ICD-10-CM | POA: Diagnosis not present

## 2014-05-27 DIAGNOSIS — I8390 Asymptomatic varicose veins of unspecified lower extremity: Secondary | ICD-10-CM

## 2014-05-27 DIAGNOSIS — M205X1 Other deformities of toe(s) (acquired), right foot: Secondary | ICD-10-CM | POA: Diagnosis not present

## 2014-05-27 MED ORDER — AZITHROMYCIN 250 MG PO TABS: ORAL_TABLET | ORAL | Status: DC

## 2014-05-27 NOTE — Patient Instructions (Signed)
Thank you for coming in, today!  Your thyroid nodules on the right are unchanged. You do not have a cyst on the left -- this is the normal cartilage in your neck you feel.  We will treat you for bronchitis with azithromycin for 5 days.  For your leg veins: Try calling [facility]. Address: [address], [place], [place] [address]  Phone: [phone].  For your foot: I think this is called a bunionette, and it has a callus over it. It is not something I can freeze off or cut out, myself. Try calling [facility]. Address: [address] [place], [place] [address] Phone: [phone]  For your hand wart: try Compound W over the counter.  Come back to see Dr. Sherril Cong about anything else. Make an appointment to see her in the next month or so. If you need a referral for either the foot doctor or the vein doctor, come back to see her sooner. Referrals will need to come from your regular primary doctor (Dr. Sherril Cong).  Please feel free to call with any questions or concerns at any time, at [phone]. --Dr. Venetia Maxon

## 2014-05-27 NOTE — Progress Notes (Signed)
Subjective:    Patient ID: [name], female    DOB: 03-Dec-1945, 69 y.o.   MRN: [id number]  HPI: Pt presents to clinic for f/u of recent thyroid ultrasound, also with several other issues she would like to address. Pt is very tangential at baseline and difficult to redirect at times.  1. Thyroid nodules - s/p US obtained a couple of weeks ago for hx of right thyroid /t hyroid isthmus nodules - pt relieved to hear that [place] report showed essentially no change from previous US in Oct 2014 and no left-sided cysts (pt previously felt she had found a left neck cyst in the area of her left thryoid cartilage) - continues to deny frank signs / symptoms that would suggest clinical hyper- or hypothyroidism  2. URI-type symptoms - present for 2-3 weeks - pt reports congestion, occasional nonproductive cough, occasional wheeze but no frank SOB, no fever, and no N/V - symptoms initially started with mild rhinorrhea / congestion and since have "settled into her chest," interfering with sleep - pt reports her roommate his similar symptoms but overall is worse (pt is a non-smoker but her roommate smokes heavily)  3. Varicose veins - present for "years," with occasional LE swelling in her ankles which is better after keeping her feet up at night - generally non-painful, but "unsightly" - again denies chest pain, SOB - pt requests referral to vein specialist but also questions my opinion about "which [office and doctor] would be better," and "what would they do"  4. Right foot "wart" (actually a callus on bottom of foot) - present "for a very long time," s/p "several interventions" by a podiatrist - reports the skin / joint / surrounding tissue around her left 5th MTP joint is painful to the point of keeping her from walking, but "not all the time" - does report the area "stays swollen" but feels as though the bone is large rather than the soft tissue being swollen - denies systemic symptoms, fever /  chills, broken skin or bleeding / drainage in the area - pt states the "wart" on the bottom of her foot "needs to be cut out at the root so it'll finally go away"  5. Left finger wart - present for an indeterminate amount of time, weeks to months - not growing larger, but irritated and occasionally itching - pt denies bleeding / drainage from the area but is concerned it "might get infected" - pt states she forgot about trying Compound W discussed at the last visit  Review of Systems: As above.     Objective:   Physical Exam BP 126/84 mmHg  Pulse 96  Temp(Src) 98.1 F (36.7 C) (Oral)  Ht 4\' 9"  (1.448 m)  Wt 116 lb (52.617 kg)  BMI 25.10 kg/m2  LMP 01/09/2012 Gen: well-appearing elderly female in NAD, very pleasant throughout long interview HEENT: [place]/AT, EOMI, PERRLA, TM's clear  Nasal and posterior oropharyngeal mucosae clear  Normal bony prominences palpated (occipital and mastoid) in areas pt indicates where "cysts" have been felt by pt  No palpable lesions of scalp to suggest cysts  No lymphadenopathy appreciated in occipital, periauricular, or cervical chains Neck: supple, full ROM Thyroid nontender, no right-sided nodules palpable Left thyroid lobe surgically absent Left thyroid cartilage of trachea palpable in area indicated by pt as the area of her "hard cyst" per HPI Cardio: RRR, no murmur Pulm: CTAB, no wheezes Abd: soft, nontender, BS+ Ext: generally warm, well-perfused  Left hand: wart present to  left 2nd digit, similar appearance to last exam with no ulceration / bleeding / drainage tenderness  Right foot: prominent, mildly tender enlargement of 5th MTP joint with moderately thick callus on plantar surface overlying metatarsal head  Right foot without any marked skin redness / warmth or broken skin  Bilateral feet with thickened, moderately dysmorphic nails but no obvious findings to suggest any paronychia     Assessment & Plan:   69yo female with multiple complaints. See individual problems below.  1. Thyroid nodules - recent unchanged from [place] 1.5 years ago, right-sided, s/p left thyroidectomy - NO left-sided neck cyst; explained again to pt she likely is feeling her left thyroid cartilage, which is normal - plan surveillance in about 1 year (instructed pt to f/u with PCP at that time)  2. Acute bronchitis - symptoms present about 2-3 weeks, likely viral initially but now possibly with bacterial superimposed infection - likely reactive lymph nodes explain "cysts" reported by pt per HPI, but are not palpable on exam, today - Rx for azithromycin x5 days, with F/u PRN - reviewed red flags that would prompt immediate return to clinic or presentation to the ED  3. Varicose veins - long-standing issue without acute change - explained to pt that any referral will need to come from her PCP (Dr. Sherril Cong) - provided pt with phone number [facility] in case referral is not needed - instructed pt to call that office and to call or come back to see PCP if a referral is needed - f/u PRN, otherwise  4. Right bunionette / foot callus - no apparent wart or other lesion amenable to in-office cryotherapy - strongly doubt gout, septic arthritis, or other intra-articular pathology given appearance and generally no systemic symptoms - explained to pt that this is not something I or other doctors here can "cut out at the root," as we do not do foot surgery - provided pt with phone number to [facility], which she reports she has been a patient previously - similar to #3, advised pt to call that office and to call or come back to see PCP if she does need a new referral - f/u PRN, otherwise  5. Viral wart of finger - discussed use of Compound W at last visit, which pt has not tried yet - no acute infection or gross worsening since last visit - reiterated advice to use Compound W and to  f/u specifically for this issue in case she does need in-office cryotherapy  6. Elevated BP - last visit showed BP elevation, now normalized and asymptomatic, as before - no clear indication for medication or lab testing, for now - monitor routinely at f/u  Note FYI to Dr. Sherril Cong; f/u with PRN for any of the above or new issues. Reiterated importance of regular f/u with PCP for chronic issues for good continuity of care.  Emmaline Kluver, MD PGY-3, [facility] 05/27/2014, 6:24 PM

## 2014-05-31 ENCOUNTER — Ambulatory Visit (INDEPENDENT_AMBULATORY_CARE_PROVIDER_SITE_OTHER): Payer: Medicare Other | Admitting: Family Medicine

## 2014-05-31 ENCOUNTER — Encounter: Payer: Self-pay | Admitting: Family Medicine

## 2014-05-31 ENCOUNTER — Telehealth: Payer: Self-pay | Admitting: Family Medicine

## 2014-05-31 VITALS — BP 146/89 | HR 88 | Temp 98.1°F | Ht <= 58 in | Wt 119.6 lb

## 2014-05-31 DIAGNOSIS — Z202 Contact with and (suspected) exposure to infections with a predominantly sexual mode of transmission: Secondary | ICD-10-CM

## 2014-05-31 DIAGNOSIS — Z0000 Encounter for general adult medical examination without abnormal findings: Secondary | ICD-10-CM | POA: Diagnosis present

## 2014-05-31 DIAGNOSIS — R2231 Localized swelling, mass and lump, right upper limb: Secondary | ICD-10-CM

## 2014-05-31 DIAGNOSIS — Z113 Encounter for screening for infections with a predominantly sexual mode of transmission: Secondary | ICD-10-CM | POA: Diagnosis not present

## 2014-05-31 DIAGNOSIS — Z23 Encounter for immunization: Secondary | ICD-10-CM | POA: Diagnosis not present

## 2014-05-31 DIAGNOSIS — N393 Stress incontinence (female) (male): Secondary | ICD-10-CM

## 2014-05-31 DIAGNOSIS — R2230 Localized swelling, mass and lump, unspecified upper limb: Secondary | ICD-10-CM | POA: Insufficient documentation

## 2014-05-31 NOTE — Assessment & Plan Note (Addendum)
Prevnar shot today Recommended mammogram with underarm lump that I cannot palpate.  adding HIV after  Patient's concerns.

## 2014-05-31 NOTE — Assessment & Plan Note (Signed)
Patient complaining of incontinence and requests GYN referral States that she's had a mesh surgery to try to correct before that did not help. Referred to GYN today.

## 2014-05-31 NOTE — Telephone Encounter (Signed)
Patient requests to call her sister phone [phone] about her Lab results anytime before March 3. In the event it will be after March 3, please call Ms. Ebbert to her personal phone [phone]. Please, follow up with Patient.

## 2014-05-31 NOTE — Progress Notes (Signed)
Patient ID: [name], female   DOB: 24-Jun-1945, 69 y.o.   MRN: [id number]   HPI  Patient presents today for  Right axillary lump  Patient explains that she noticed a lump in her right axillary area about one week ago. It's nontender and has not changed in size since she noticed it. She's not had a mammogram in several years. She is concerned about cancer.  She denies fever, chills, sweats , chest pain.  She states that she has bipolar disorder , but does not feel depressed or manic today.   she also requests pneumonia shot today.   incontinence Has a history of  Stress incontinence for which she's had a best surgery for several years ago. She would like to be reevaluated by OB/GYN to see if there is anything else that could be done. She requests Combs.  Smoking status noted ROS: Per HPI  Objective: BP 146/89 mmHg  Pulse 88  Temp(Src) 98.1 F (36.7 C) (Oral)  Ht 4\' 9"  (1.448 m)  Wt 119 lb 9.6 oz (54.25 kg)  BMI 25.87 kg/m2  LMP 01/09/2012 Gen: NAD, alert, cooperative with exam HEENT: NCAT Breast: Right breast examined without any tenderness , lumps, or skin changes. No nipple retraction on that side, right axillary area with no palpable nodule or lump even when the patient showed specifically where she thought the lump was. No tenderness to palpation of the right axilla CV: RRR, good S1/S2, no murmur Resp: CTABL, no wheezes, non-labored Ext: No edema, warm Neuro: Alert and oriented, No gross deficits Psych: tangential thought process, activated affect  Assessment and plan:  URINARY INCONTINENCE, STRESS, FEMALE  Patient complaining of incontinence and requests GYN referral States that she's had a mesh surgery to try to correct before that did not help. Referred to GYN today.   Healthcare maintenance  Prevnar shot today Recommended mammogram with underarm lump that I cannot palpate.   Axillary lump  Axillary lump the patient states is there, however on  exam I cannot palpate it Recommended mammogram to be sure.    Orders Placed This Encounter  Procedures  . Ambulatory referral to Obstetrics / Gynecology    Referral Priority:  Routine    Referral Type:  Consultation    Referral Reason:  Specialty Services Required    Requested Specialty:  Obstetrics and Gynecology    Number of Visits Requested:  1

## 2014-05-31 NOTE — Assessment & Plan Note (Signed)
Axillary lump the patient states is there, however on exam I cannot palpate it Recommended mammogram to be sure.

## 2014-05-31 NOTE — Patient Instructions (Signed)
Great to meet you!  You should hear about your GYN referral in the next week or so  You can call [facility] to make an appointment for a screening mammogram Address: [address], [place], [place] [address] [phone]  PLease come back for re-evaluation if the area becomes tender or gets larger, or concerns you at all.

## 2014-06-01 LAB — HIV ANTIBODY (ROUTINE TESTING W REFLEX): HIV 1&2 Ab, 4th Generation: NONREACTIVE

## 2014-06-03 ENCOUNTER — Encounter: Payer: Self-pay | Admitting: Family Medicine

## 2014-06-04 NOTE — Telephone Encounter (Signed)
Left message with woman for patient to call back.

## 2014-06-05 NOTE — Telephone Encounter (Signed)
Patient informed of negative test results.

## 2014-06-12 ENCOUNTER — Encounter (HOSPITAL_COMMUNITY): Payer: Self-pay

## 2014-06-12 ENCOUNTER — Emergency Department (INDEPENDENT_AMBULATORY_CARE_PROVIDER_SITE_OTHER)
Admission: EM | Admit: 2014-06-12 | Discharge: 2014-06-12 | Disposition: A | Discharge status: Home / Self Care | Payer: Medicare Other | Source: Home / Self Care | Attending: Family Medicine | Admitting: Family Medicine

## 2014-06-12 DIAGNOSIS — J111 Influenza due to unidentified influenza virus with other respiratory manifestations: Secondary | ICD-10-CM | POA: Diagnosis not present

## 2014-06-12 MED ORDER — DEXTROMETHORPHAN POLISTIREX 30 MG/5ML PO LQCR: 60.0000 mg | Freq: Two times a day (BID) | ORAL | Status: DC

## 2014-06-12 MED ORDER — MINOCYCLINE HCL 100 MG PO CAPS: 100.0000 mg | ORAL_CAPSULE | Freq: Two times a day (BID) | ORAL | Status: DC

## 2014-06-12 MED ORDER — IPRATROPIUM BROMIDE 0.06 % NA SOLN: 2.0000 | Freq: Four times a day (QID) | NASAL | Status: DC

## 2014-06-12 NOTE — Discharge Instructions (Signed)
Take all of medicine, drink lots of fluids,, see your doctor if further problems °

## 2014-06-12 NOTE — ED Notes (Signed)
Cough x past few days. Reportedly has already completed Z-Pack, and was getting better, but now has started to get sick again. C/o pain in her foot (bunion)

## 2014-06-12 NOTE — ED Provider Notes (Signed)
CSN: [id number]     Arrival date & time 06/12/14  1147 History   First MD Initiated Contact with Patient 06/12/14 1304     Chief Complaint  Patient presents with  . Cough   (Consider location/radiation/quality/duration/timing/severity/associated sxs/prior Treatment) Patient is a 69 y.o. female presenting with cough. The history is provided by the patient.  Cough Cough characteristics:  Non-productive and dry Severity:  Mild Onset quality:  Gradual Duration:  3 days Progression:  Unchanged Chronicity:  New Context: upper respiratory infection and weather changes   Associated symptoms: rhinorrhea, sinus congestion and wheezing   Associated symptoms: no fever and no shortness of breath     Past Medical History  Diagnosis Date  . OCD (obsessive compulsive disorder)   . Depression   . Anxiety   . PUD (peptic ulcer disease) 09/2006    H pylori  . Diverticulosis of colon   . GERD (gastroesophageal reflux disease)   . COPD (chronic obstructive pulmonary disease)     Due to long history of tobacco abuse, still smoking  . History of uterine prolapse 10/2004    Dr Cletis Media  . Thyroid disease     Ho R thyroid noduel plus mild hyperthyroidism. Treated with radioiodine therapty on 04/2006. Dr Estrella Myrtle.  [name] Heart murmur   . Osteoporosis    Past Surgical History  Procedure Laterality Date  . Colonoscopy  06/03/2005    Hyperplastic polyps, sigmoid diverticulosis, internal hemorroids  . Ganglion removal  12/2005    R wrist subretinacular dorsal ganglion  . Biopsy thyroid  08/2008    Atypia and Hurtle cells, partial thyroidectomy   . Tonsillectomy and adenoidectomy  10/2004  . Pft  11/2006    Obstructive pattern.  Poor response to bronchodilators.   . Thyroidectomy, partial  08/2008    Dr Ronnald Collum  . Transthoracic echocardiogram  12/06/4266    Normal systolic function.  EF 55-60%.  Mild MR, atria ok, trivial pericardial effusion  . Carotid ultrasound  09/10/09    No significant extracranial carotid  artery stenosis, vertebrals are patent with antegrade flow.   . Mri head  09/20/09    No acute intracranial abn.  Signal abnormality suggestive of chronic small vessel ischemia, maximal in the right subinsular white and deep gray mater.   . Tvt  06/15/10    Tension-free Vaginal Tape, Dr Mancel Bale, for urge incontinence  . Abdominal hysterectomy    . Bladder suspension      mesh sling   Family History  Problem Relation Age of Onset  . Heart disease Mother   . Heart disease Maternal Grandmother   . Heart disease Maternal Grandfather   . Cirrhosis Sister   . Hypertension Sister   . Other Neg Hx    History  Substance Use Topics  . Smoking status: Former Smoker -- 0.20 packs/day for 35 years    Types: Cigarettes    Quit date: 05/25/2012  . Smokeless tobacco: Never Used     Comment: smokes less than 1 pack per week. ready to quitt.  . Alcohol Use: No   OB History    Gravida Para Term Preterm AB TAB SAB Ectopic Multiple Living   2         2     Review of Systems  Constitutional: Negative.  Negative for fever.  HENT: Positive for congestion, postnasal drip and rhinorrhea.   Respiratory: Positive for cough and wheezing. Negative for shortness of breath.   Gastrointestinal: Negative.  Allergies  Abilify; Amoxicillin; Ciprofloxacin; Haloperidol lactate; Ketorolac tromethamine; Latuda; Seroquel; and Wellbutrin  Home Medications   Prior to Admission medications   Medication Sig Start Date End Date Taking? Authorizing Provider  ARIPiprazole (ABILIFY) 2 MG tablet  11/21/13  Yes Historical Provider, MD  venlafaxine XR (EFFEXOR-XR) 150 MG 24 hr capsule Take 1 capsule (150 mg total) by mouth daily with breakfast. 09/26/13  Yes [name], NP  zolpidem (AMBIEN) 5 MG tablet Take 1 tablet (5 mg total) by mouth at bedtime as needed for sleep. 08/20/13  Yes Lupita Dawn, MD  albuterol (PROVENTIL HFA;VENTOLIN HFA) 108 (90 BASE) MCG/ACT inhaler Inhale 2 puffs into the lungs every 4 (four)  hours as needed for wheezing or shortness of breath (OR TIGHTNESS). 08/11/13   Margarita Mail, PA-C  aspirin 325 MG tablet Take 162.5 mg by mouth See admin instructions. Take 1/2 tablet (162.5 mg) every morning, take an additional 1/2 tablet (162.5 mg) in the evening if needed for pain    Historical Provider, MD  azithromycin (ZITHROMAX) 250 MG tablet Take two pills on day 1, then one pill a day for days 2-5. 05/27/14   Sharon [address], MD  dextromethorphan (DELSYM) 30 MG/5ML liquid Take 10 mLs (60 mg total) by mouth 2 (two) times daily. For cough 06/12/14   Billy Fischer, MD  ipratropium (ATROVENT) 0.06 % nasal spray Place 2 sprays into both nostrils 4 (four) times daily. 06/12/14   Billy Fischer, MD  levothyroxine (SYNTHROID, LEVOTHROID) 75 MCG tablet Take 1 tablet (75 mcg total) by mouth daily before breakfast. 09/26/13   [name], NP  lisinopril (PRINIVIL,ZESTRIL) 5 MG tablet TAKE 1 TABLET (5 MG TOTAL) BY MOUTH DAILY. 10/12/13   Frazier Richards, MD  minocycline (MINOCIN,DYNACIN) 100 MG capsule Take 1 capsule (100 mg total) by mouth 2 (two) times daily. 06/12/14   Billy Fischer, MD  omeprazole (PRILOSEC) 20 MG capsule Take 1 capsule (20 mg total) by mouth daily. 09/26/13   [name], NP  pantoprazole (PROTONIX) 20 MG tablet Take 1 tablet (20 mg total) by mouth daily. 01/23/14   Leone Brand, MD  promethazine (PHENERGAN) 25 MG tablet TAKE 1/2 TABLET (12.5 MG TOTAL) BY MOUTH EVERY 8 (EIGHT) HOURS AS NEEDED FOR NAUSEA OR VOMITING. 01/03/14   Frazier Richards, MD  vitamin C (ASCORBIC ACID) 500 MG tablet Take 6 tablets (3,000 mg total) by mouth daily. 09/26/13   [name], NP  vitamin E 400 UNIT capsule Take 2 capsules (800 Units total) by mouth daily. 09/26/13   [name], NP   BP 158/97 mmHg  Pulse 79  Temp(Src) 98 F (36.7 C) (Oral)  Resp 12  SpO2 96%  LMP 01/09/2012 Physical Exam  Constitutional: She is oriented to person, place, and time. She appears well-developed and  well-nourished. No distress.  HENT:  Right Ear: External ear normal.  Left Ear: External ear normal.  Mouth/Throat: Oropharynx is clear and moist.  Neck: Normal range of motion. Neck supple.  Cardiovascular: Normal heart sounds and intact distal pulses.   Pulmonary/Chest: Effort normal and breath sounds normal.  Lymphadenopathy:    She has no cervical adenopathy.  Neurological: She is alert and oriented to person, place, and time.  Skin: Skin is warm and dry.  Nursing note and vitals reviewed.   ED Course  Procedures (including critical care time) Labs Review Labs Reviewed - No data to display  Imaging Review No results found.  MDM   1. Bronchitis with influenza        Billy Fischer, MD 06/12/14 1328

## 2014-06-13 ENCOUNTER — Encounter (HOSPITAL_COMMUNITY): Payer: Self-pay | Admitting: Emergency Medicine

## 2014-06-13 ENCOUNTER — Inpatient Hospital Stay (HOSPITAL_COMMUNITY)
Admission: EM | Admit: 2014-06-13 | Discharge: 2014-06-15 | DRG: 192 | Disposition: A | Discharge status: Home / Self Care | Payer: Medicare Other | Attending: Family Medicine | Admitting: Family Medicine

## 2014-06-13 ENCOUNTER — Ambulatory Visit: Payer: Self-pay | Admitting: Family Medicine

## 2014-06-13 ENCOUNTER — Emergency Department (HOSPITAL_COMMUNITY): Payer: Medicare Other

## 2014-06-13 DIAGNOSIS — F319 Bipolar disorder, unspecified: Secondary | ICD-10-CM | POA: Diagnosis present

## 2014-06-13 DIAGNOSIS — J209 Acute bronchitis, unspecified: Secondary | ICD-10-CM | POA: Diagnosis present

## 2014-06-13 DIAGNOSIS — H9190 Unspecified hearing loss, unspecified ear: Secondary | ICD-10-CM | POA: Diagnosis present

## 2014-06-13 DIAGNOSIS — Z87891 Personal history of nicotine dependence: Secondary | ICD-10-CM | POA: Diagnosis not present

## 2014-06-13 DIAGNOSIS — F419 Anxiety disorder, unspecified: Secondary | ICD-10-CM | POA: Diagnosis present

## 2014-06-13 DIAGNOSIS — R05 Cough: Secondary | ICD-10-CM | POA: Diagnosis not present

## 2014-06-13 DIAGNOSIS — J101 Influenza due to other identified influenza virus with other respiratory manifestations: Secondary | ICD-10-CM | POA: Diagnosis present

## 2014-06-13 DIAGNOSIS — E039 Hypothyroidism, unspecified: Secondary | ICD-10-CM | POA: Diagnosis present

## 2014-06-13 DIAGNOSIS — Z7982 Long term (current) use of aspirin: Secondary | ICD-10-CM

## 2014-06-13 DIAGNOSIS — I10 Essential (primary) hypertension: Secondary | ICD-10-CM | POA: Diagnosis present

## 2014-06-13 DIAGNOSIS — Z79899 Other long term (current) drug therapy: Secondary | ICD-10-CM

## 2014-06-13 DIAGNOSIS — J441 Chronic obstructive pulmonary disease with (acute) exacerbation: Principal | ICD-10-CM | POA: Insufficient documentation

## 2014-06-13 DIAGNOSIS — K219 Gastro-esophageal reflux disease without esophagitis: Secondary | ICD-10-CM | POA: Diagnosis present

## 2014-06-13 DIAGNOSIS — Z8673 Personal history of transient ischemic attack (TIA), and cerebral infarction without residual deficits: Secondary | ICD-10-CM

## 2014-06-13 DIAGNOSIS — M810 Age-related osteoporosis without current pathological fracture: Secondary | ICD-10-CM | POA: Diagnosis present

## 2014-06-13 DIAGNOSIS — J449 Chronic obstructive pulmonary disease, unspecified: Secondary | ICD-10-CM | POA: Diagnosis not present

## 2014-06-13 DIAGNOSIS — R069 Unspecified abnormalities of breathing: Secondary | ICD-10-CM | POA: Diagnosis not present

## 2014-06-13 DIAGNOSIS — E785 Hyperlipidemia, unspecified: Secondary | ICD-10-CM | POA: Diagnosis present

## 2014-06-13 DIAGNOSIS — F42 Obsessive-compulsive disorder: Secondary | ICD-10-CM | POA: Diagnosis present

## 2014-06-13 DIAGNOSIS — F329 Major depressive disorder, single episode, unspecified: Secondary | ICD-10-CM | POA: Diagnosis present

## 2014-06-13 DIAGNOSIS — J45909 Unspecified asthma, uncomplicated: Secondary | ICD-10-CM | POA: Diagnosis not present

## 2014-06-13 DIAGNOSIS — R0602 Shortness of breath: Secondary | ICD-10-CM | POA: Diagnosis not present

## 2014-06-13 LAB — COMPREHENSIVE METABOLIC PANEL
ALT: 14 U/L (ref 0–35)
AST: 23 U/L (ref 0–37)
Albumin: 3.5 g/dL (ref 3.5–5.2)
Alkaline Phosphatase: 103 U/L (ref 39–117)
Anion gap: 7 (ref 5–15)
BUN: 7 mg/dL (ref 6–23)
CO2: 31 mmol/L (ref 19–32)
Calcium: 8.4 mg/dL (ref 8.4–10.5)
Chloride: 95 mmol/L — ABNORMAL LOW (ref 96–112)
Creatinine, Ser: 0.76 mg/dL (ref 0.50–1.10)
GFR calc Af Amer: >90 mL/min (ref >90)
GFR calc non Af Amer: 83 mL/min — ABNORMAL LOW (ref >90)
Glucose, Bld: 115 mg/dL — ABNORMAL HIGH (ref 70–99)
Potassium: 3.6 mmol/L (ref 3.5–5.1)
Sodium: 133 mmol/L — ABNORMAL LOW (ref 135–145)
Total Bilirubin: 0.6 mg/dL (ref 0.3–1.2)
Total Protein: 5.9 g/dL — ABNORMAL LOW (ref 6.0–8.3)

## 2014-06-13 LAB — INFLUENZA PANEL BY PCR (TYPE A & B)
H1N1 flu by pcr: DETECTED — AB
Influenza A By PCR: POSITIVE — AB
Influenza B By PCR: NEGATIVE

## 2014-06-13 LAB — CBC
HCT: 37.4 % (ref 36.0–46.0)
Hemoglobin: 12.1 g/dL (ref 12.0–15.0)
MCH: 32.2 pg (ref 26.0–34.0)
MCHC: 32.4 g/dL (ref 30.0–36.0)
MCV: 99.5 fL (ref 78.0–100.0)
Platelets: 212 10*3/uL (ref 150–400)
RBC: 3.76 MIL/uL — ABNORMAL LOW (ref 3.87–5.11)
RDW: 14 % (ref 11.5–15.5)
WBC: 5 10*3/uL (ref 4.0–10.5)

## 2014-06-13 LAB — I-STAT TROPONIN, ED: Troponin i, poc: 0 ng/mL (ref 0.00–0.08)

## 2014-06-13 MED ORDER — OSELTAMIVIR PHOSPHATE 75 MG PO CAPS: 75.0000 mg | ORAL_CAPSULE | Freq: Two times a day (BID) | ORAL | Status: DC

## 2014-06-13 MED ORDER — DOXYCYCLINE HYCLATE 100 MG PO TABS
100.0000 mg | ORAL_TABLET | Freq: Two times a day (BID) | ORAL | Status: DC
  Administered 2014-06-13 – 2014-06-14 (×2): 100 mg via ORAL
  Filled 2014-06-13 (×4): qty 1

## 2014-06-13 MED ORDER — ALBUTEROL SULFATE (2.5 MG/3ML) 0.083% IN NEBU: 5.0000 mg | INHALATION_SOLUTION | RESPIRATORY_TRACT | Status: DC | PRN

## 2014-06-13 MED ORDER — IPRATROPIUM-ALBUTEROL 0.5-2.5 (3) MG/3ML IN SOLN
3.0000 mL | Freq: Four times a day (QID) | RESPIRATORY_TRACT | Status: DC | PRN
  Administered 2014-06-14: 3 mL via RESPIRATORY_TRACT
  Filled 2014-06-13: qty 3

## 2014-06-13 MED ORDER — ENOXAPARIN SODIUM 40 MG/0.4ML ~~LOC~~ SOLN
40.0000 mg | SUBCUTANEOUS | Status: DC
  Filled 2014-06-13 (×3): qty 0.4

## 2014-06-13 MED ORDER — IPRATROPIUM-ALBUTEROL 0.5-2.5 (3) MG/3ML IN SOLN
3.0000 mL | Freq: Three times a day (TID) | RESPIRATORY_TRACT | Status: DC
  Administered 2014-06-14 – 2014-06-15 (×4): 3 mL via RESPIRATORY_TRACT
  Filled 2014-06-13 (×4): qty 3

## 2014-06-13 MED ORDER — ACETAMINOPHEN 325 MG PO TABS: 650.0000 mg | ORAL_TABLET | Freq: Four times a day (QID) | ORAL | Status: DC | PRN

## 2014-06-13 MED ORDER — LEVOTHYROXINE SODIUM 75 MCG PO TABS
75.0000 ug | ORAL_TABLET | Freq: Every day | ORAL | Status: DC
  Administered 2014-06-14 – 2014-06-15 (×2): 75 ug via ORAL
  Filled 2014-06-13 (×3): qty 1

## 2014-06-13 MED ORDER — PREDNISONE 50 MG PO TABS
50.0000 mg | ORAL_TABLET | Freq: Every day | ORAL | Status: DC
  Administered 2014-06-14 – 2014-06-15 (×2): 50 mg via ORAL
  Filled 2014-06-13 (×2): qty 1

## 2014-06-13 MED ORDER — ARIPIPRAZOLE 2 MG PO TABS
2.0000 mg | ORAL_TABLET | Freq: Every day | ORAL | Status: DC
  Administered 2014-06-14 – 2014-06-15 (×2): 2 mg via ORAL
  Filled 2014-06-13 (×2): qty 1

## 2014-06-13 MED ORDER — VENLAFAXINE HCL ER 150 MG PO CP24
150.0000 mg | ORAL_CAPSULE | Freq: Every day | ORAL | Status: DC
  Administered 2014-06-14 – 2014-06-15 (×2): 150 mg via ORAL
  Filled 2014-06-13 (×3): qty 1

## 2014-06-13 MED ORDER — IPRATROPIUM BROMIDE 0.02 % IN SOLN
0.5000 mg | Freq: Once | RESPIRATORY_TRACT | Status: AC
  Administered 2014-06-13: 0.5 mg via RESPIRATORY_TRACT
  Filled 2014-06-13: qty 2.5

## 2014-06-13 MED ORDER — ZOLPIDEM TARTRATE 5 MG PO TABS
5.0000 mg | ORAL_TABLET | Freq: Every evening | ORAL | Status: DC | PRN
  Administered 2014-06-13: 5 mg via ORAL
  Filled 2014-06-13 (×2): qty 1

## 2014-06-13 MED ORDER — SODIUM CHLORIDE 0.9 % IJ SOLN
3.0000 mL | Freq: Two times a day (BID) | INTRAMUSCULAR | Status: DC
  Administered 2014-06-13 – 2014-06-14 (×4): 3 mL via INTRAVENOUS

## 2014-06-13 MED ORDER — OSELTAMIVIR PHOSPHATE 30 MG PO CAPS
30.0000 mg | ORAL_CAPSULE | Freq: Two times a day (BID) | ORAL | Status: DC
  Administered 2014-06-13 – 2014-06-15 (×4): 30 mg via ORAL
  Filled 2014-06-13 (×5): qty 1

## 2014-06-13 MED ORDER — LISINOPRIL 5 MG PO TABS
5.0000 mg | ORAL_TABLET | Freq: Every day | ORAL | Status: DC
  Administered 2014-06-14 – 2014-06-15 (×2): 5 mg via ORAL
  Filled 2014-06-13 (×3): qty 1

## 2014-06-13 MED ORDER — PREDNISONE 20 MG PO TABS
60.0000 mg | ORAL_TABLET | Freq: Once | ORAL | Status: AC
  Administered 2014-06-13: 60 mg via ORAL
  Filled 2014-06-13: qty 3

## 2014-06-13 MED ORDER — ACETAMINOPHEN 650 MG RE SUPP
650.0000 mg | Freq: Four times a day (QID) | RECTAL | Status: DC | PRN
Start: 2014-06-13 — End: 2014-06-15

## 2014-06-13 MED ORDER — ALBUTEROL SULFATE (2.5 MG/3ML) 0.083% IN NEBU
5.0000 mg | INHALATION_SOLUTION | Freq: Once | RESPIRATORY_TRACT | Status: AC
  Administered 2014-06-13: 5 mg via RESPIRATORY_TRACT
  Filled 2014-06-13: qty 6

## 2014-06-13 MED ORDER — SODIUM CHLORIDE 0.9 % IV SOLN
INTRAVENOUS | Status: DC
  Administered 2014-06-13: 13:00:00 via INTRAVENOUS

## 2014-06-13 MED ORDER — ASPIRIN EC 81 MG PO TBEC
81.0000 mg | DELAYED_RELEASE_TABLET | Freq: Every day | ORAL | Status: DC
  Administered 2014-06-14 – 2014-06-15 (×2): 81 mg via ORAL
  Filled 2014-06-13 (×3): qty 1

## 2014-06-13 MED ORDER — ONDANSETRON HCL 4 MG PO TABS: 4.0000 mg | ORAL_TABLET | Freq: Four times a day (QID) | ORAL | Status: DC | PRN

## 2014-06-13 MED ORDER — IPRATROPIUM-ALBUTEROL 0.5-2.5 (3) MG/3ML IN SOLN
3.0000 mL | Freq: Four times a day (QID) | RESPIRATORY_TRACT | Status: DC
  Administered 2014-06-13 (×2): 3 mL via RESPIRATORY_TRACT
  Filled 2014-06-13 (×2): qty 3

## 2014-06-13 MED ORDER — ONDANSETRON HCL 4 MG/2ML IJ SOLN: 4.0000 mg | Freq: Four times a day (QID) | INTRAMUSCULAR | Status: DC | PRN

## 2014-06-13 NOTE — ED Notes (Signed)
Pt arrives via EMS from home with cough, cold, uri sx for the last 3 weeks. Seen at urgent care yesterday given steroid shot and abx. No improvement. Arrives tachypneic, wheezing, denies recent fever.

## 2014-06-13 NOTE — H&P (Signed)
[facility] Admission History and Physical Service Pager: [phone]  Patient name: [name] Medical record number: [id number] Date of birth: 09-02-1945 Age: 69 y.o. Gender: female  Primary Care Provider: Beverlyn Roux, MD Consultants: None Code Status: Full Code  Chief Complaint: SOB  Assessment and Plan: SANJA ELIZARDO is a 69 y.o. female presenting with acute COPD exacerbation. PMH is significant for  COPD, hypertension, hyperlipidemia, history of CVA, depression/anxiety/OCD/bipolar disorder, and hypothyroidism.  Acute COPD exacerbation - patient is recently been treated for acute bronchitis with some improvement. She had worsening of her symptoms and acute shortness of breath this morning.  In emergency department, she required supplemental oxygen. Patient was afebrile. No leukocytosis on CBC. Chest x-ray negative for acute infiltrate but did show changes consistent with COPD. Troponin negative. EKG unremarkable. Given past medical history and tobacco abuse, her history and physical exam are consistent with acute COPD exacerbation. This was likely the result of viral illness (possibly influenza although I doubt it given duration of illness).  Admit to telemetry attending Dr. Nehemiah Settle Q6H, Albuterol Q2 PRN, Doxycycline  Given Prednisone 60 mg in the ED. Will continue w/ Prednisone 50 mg x 5 days.  Supplemental O2 as needed  Patient not on any home medications for COPD. Anticipate the patient will need albuterol and inhaled corticosteroid or Spiriva when she is discharged.  HTN  Continuing home lisinopril.  History of CVA  Aspirin 81 mg daily.  Psych - patient with several psych diagnosis problem list.  Will continue home Abilify and Effexor.  Continuing home Ambien for insomnia.  Hypothyroidism  Continuing home Synthroid.  HLD  Last Lipid panel was 2014.  Consider reassessing during admission.  Patient in need of statin given  risk factors and prior CVA.  FEN/GI: NS @ 75 mL/hr. Heart Healthy diet. Prophylaxis: Lovenox  Disposition: Admit to Tele; home pending clinical improvement.  History of Present Illness: [name] is a 69 y.o. female with a past medical history of COPD, hypertension, hyperlipidemia, history of CVA, depression/anxiety/OCD/bipolar disorder, and hypothyroidism who presents with worsening shortness of breath and cough.  Patient states she's been sick for approximately 3 weeks. She's been experiencing productive cough and URI symptoms. She was seen by Dr. Venetia Maxon on 2/22 and was given a Z-Pak for acute bronchitis. Patient reports that she completed this and was feeling better. However subsequently she developed a recurrence of her symptoms and has been reportedly experiencing fever, myalgias, and chills. Due to worsening symptoms, she presented to urgent care on 3/9. She was evaluated and was diagnosed with influenza (no testing was performed) and was treated for bronchitis with minocycline.  Early this morning, patient began having worsening shortness of breath. This prompted her to seek treatment in the emergency department.  In the ED, patient had an oxygen requirement. She was treated with albuterol nebulizer with some improvement. However, patient continued to have an oxygen requirement which worsened with ambulation. As a result family medicine was called for admission.  Review Of Systems: Per HPI with the following additions: Fatigue, poor appetite, decreased oral intake.  Otherwise 12 point review of systems was performed and was unremarkable.  Patient Active Problem List   Diagnosis Date Noted  . Healthcare maintenance 05/31/2014  . Emotional neurotic disorder 05/07/2014  . Seborrheic keratosis 12/05/2013  . Depression, major, severe recurrence 07/22/2013  . 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 At high risk for falls 06/28/2012  . Allergic rhinitis 06/16/2012  .  Hearing loss  01/07/2011  . Foot pain 11/25/2010  . Thyroid nodule 10/16/2010  . URINARY INCONTINENCE, STRESS, FEMALE 04/03/2010  . HYPERLIPIDEMIA 03/25/2010  . HYPERTENSION 03/25/2010  . PULMONARY NODULE 12/23/2009  . COPD 12/03/2009  . Tobacco abuse counseling 11/05/2009  . CVA 11/05/2009  . BIPOLAR DISORDER UNSPECIFIED 11/28/2008  . HYPOTHYROIDISM 05/06/2008  . ANXIETY DISORDER 10/13/2006  . OBSESSIVE-COMPULSIVE DISORDER 10/13/2006  . GERD 10/13/2006  . OSTEOPOROSIS 10/13/2006   Past Medical History: Past Medical History  Diagnosis Date  . OCD (obsessive compulsive disorder)   . Depression   . Anxiety   . PUD (peptic ulcer disease) 09/2006    H pylori  . Diverticulosis of colon   . GERD (gastroesophageal reflux disease)   . COPD (chronic obstructive pulmonary disease)     Due to long history of tobacco abuse, still smoking  . History of uterine prolapse 10/2004    Dr Cletis Media  . Thyroid disease     Ho R thyroid noduel plus mild hyperthyroidism. Treated with radioiodine therapty on 04/2006. Dr Estrella Myrtle.  [name] Heart murmur   . Osteoporosis    Past Surgical History: Past Surgical History  Procedure Laterality Date  . Colonoscopy  06/03/2005    Hyperplastic polyps, sigmoid diverticulosis, internal hemorroids  . Ganglion removal  12/2005    R wrist subretinacular dorsal ganglion  . Biopsy thyroid  08/2008    Atypia and Hurtle cells, partial thyroidectomy   . Tonsillectomy and adenoidectomy  10/2004  . Pft  11/2006    Obstructive pattern.  Poor response to bronchodilators.   . Thyroidectomy, partial  08/2008    Dr Ronnald Collum  . Transthoracic echocardiogram  07/09/6597    Normal systolic function.  EF 55-60%.  Mild MR, atria ok, trivial pericardial effusion  . Carotid ultrasound  09/10/09    No significant extracranial carotid artery stenosis, vertebrals are patent with antegrade flow.   . Mri head  09/20/09    No acute intracranial abn.  Signal abnormality suggestive of chronic small vessel ischemia,  maximal in the right subinsular white and deep gray mater.   . Tvt  06/15/10    Tension-free Vaginal Tape, Dr Mancel Bale, for urge incontinence  . Abdominal hysterectomy    . Bladder suspension      mesh sling   Social History: History  Substance Use Topics  . Smoking status: Former Smoker -- 0.20 packs/day for 35 years    Types: Cigarettes    Quit date: 05/25/2012  . Smokeless tobacco: Never Used     Comment: smokes less than 1 pack per week. ready to quitt.  . Alcohol Use: No   Family History: Family History  Problem Relation Age of Onset  . Heart disease Mother   . Heart disease Maternal Grandmother   . Heart disease Maternal Grandfather   . Cirrhosis Sister   . Hypertension Sister   . Other Neg Hx    Allergies and Medications: Allergies  Allergen Reactions  . Amoxicillin Other (See Comments)    Pt doesn't remember reaction  . Ciprofloxacin Other (See Comments)    Pt doesn't remember reaction  . Haloperidol Lactate Other (See Comments)    Made arms stiff  . Ketorolac Tromethamine Other (See Comments)    Pt doesn't remember reaction  . Anette Guarneri [Lurasidone Hcl] Other (See Comments)    Patient says he made her feel weird   . Seroquel [Quetiapine Fumarate] Other (See Comments)    Made patient drowsy   . Wellbutrin [Bupropion]  Other (See Comments)    Makes her feel "sick, strange".   No current facility-administered medications on file prior to encounter.   Current Outpatient Prescriptions on File Prior to Encounter  Medication Sig Dispense Refill  . albuterol (PROVENTIL HFA;VENTOLIN HFA) 108 (90 BASE) MCG/ACT inhaler Inhale 2 puffs into the lungs every 4 (four) hours as needed for wheezing or shortness of breath (OR TIGHTNESS). 1 Inhaler 0  . ARIPiprazole (ABILIFY) 2 MG tablet     . aspirin 325 MG tablet Take 162.5 mg by mouth See admin instructions. Take 1/2 tablet (162.5 mg) every morning, take an additional 1/2 tablet (162.5 mg) in the evening if needed for pain     . azithromycin (ZITHROMAX) 250 MG tablet Take two pills on day 1, then one pill a day for days 2-5. 6 tablet 0  . dextromethorphan (DELSYM) 30 MG/5ML liquid Take 10 mLs (60 mg total) by mouth 2 (two) times daily. For cough 89 mL 0  . ipratropium (ATROVENT) 0.06 % nasal spray Place 2 sprays into both nostrils 4 (four) times daily. 15 mL 1  . levothyroxine (SYNTHROID, LEVOTHROID) 75 MCG tablet Take 1 tablet (75 mcg total) by mouth daily before breakfast.    . lisinopril (PRINIVIL,ZESTRIL) 5 MG tablet TAKE 1 TABLET (5 MG TOTAL) BY MOUTH DAILY. 30 tablet 6  . minocycline (MINOCIN,DYNACIN) 100 MG capsule Take 1 capsule (100 mg total) by mouth 2 (two) times daily. 14 capsule 0  . promethazine (PHENERGAN) 25 MG tablet TAKE 1/2 TABLET (12.5 MG TOTAL) BY MOUTH EVERY 8 (EIGHT) HOURS AS NEEDED FOR NAUSEA OR VOMITING. 30 tablet 2  . venlafaxine XR (EFFEXOR-XR) 150 MG 24 hr capsule Take 1 capsule (150 mg total) by mouth daily with breakfast. 30 capsule 0  . vitamin C (ASCORBIC ACID) 500 MG tablet Take 6 tablets (3,000 mg total) by mouth daily.    . vitamin E 400 UNIT capsule Take 2 capsules (800 Units total) by mouth daily.    [name] zolpidem (AMBIEN) 5 MG tablet Take 1 tablet (5 mg total) by mouth at bedtime as needed for sleep. 10 tablet 0    Objective: BP 138/76 mmHg  Pulse 86  Temp(Src) 98.1 F (36.7 C) (Oral)  Resp 26  SpO2 97%  LMP 01/09/2012 Exam: General: chronically ill appearing female in NAD.  HEENT: NCAT. Dry mucous membranes. Cardiovascular: RRR. No murmur noted.  Respiratory: Prolonged extra phase. Diffuse wheezing throughout all lung fields. Mild increased work of breathing. Abdomen: Soft, nontender, nondistended. No palpable organomegaly. Extremities: No lower extremity edema. Skin: Warm, dry, intact. Neuro: No focal deficits. Psych: Flat affect noted.  Labs and Imaging: CBC BMET   Recent Labs Lab 06/13/14 0841  WBC 5.0  HGB 12.1  HCT 37.4  PLT 212    Recent Labs Lab  06/13/14 0841  NA 133*  K 3.6  CL 95*  CO2 31  BUN 7  CREATININE 0.76  GLUCOSE 115*  CALCIUM 8.4     Troponin - Neg.  EKG - No signs of ischemia.   Dg Chest 2 View 06/13/2014  IMPRESSION: COPD changes.  No acute abnormalities.    Coral Spikes, DO 06/13/2014, 10:40 AM PGY-3, [place] Intern pager: [phone], text pages welcome

## 2014-06-13 NOTE — ED Provider Notes (Signed)
CSN: [id number]     Arrival date & time 06/13/14  0803 History   First MD Initiated Contact with Patient 06/13/14 [phone]     Chief Complaint  Patient presents with  . Shortness of Breath     (Consider location/radiation/quality/duration/timing/severity/associated sxs/prior Treatment) HPI Comments: Patient is a 69 year old female with a past medical history of hypertension, COPD, thyroid disease and osteoporosis who presents to the ED via EMS with shortness of breath that started this morning. Symptoms started gradually and remained constant since the onset. Patient reports having a 3 week history of cough and nasal congestion that she was seen at Urgent Care for yesterday. Patient was given steroids and antibiotics yesterday which did not help. No aggravating/alleviating factors. No other associated symptoms.    Past Medical History  Diagnosis Date  . OCD (obsessive compulsive disorder)   . Depression   . Anxiety   . PUD (peptic ulcer disease) 09/2006    H pylori  . Diverticulosis of colon   . GERD (gastroesophageal reflux disease)   . COPD (chronic obstructive pulmonary disease)     Due to long history of tobacco abuse, still smoking  . History of uterine prolapse 10/2004    Dr Cletis Media  . Thyroid disease     Ho R thyroid noduel plus mild hyperthyroidism. Treated with radioiodine therapty on 04/2006. Dr Estrella Myrtle.  [name] Heart murmur   . Osteoporosis    Past Surgical History  Procedure Laterality Date  . Colonoscopy  06/03/2005    Hyperplastic polyps, sigmoid diverticulosis, internal hemorroids  . Ganglion removal  12/2005    R wrist subretinacular dorsal ganglion  . Biopsy thyroid  08/2008    Atypia and Hurtle cells, partial thyroidectomy   . Tonsillectomy and adenoidectomy  10/2004  . Pft  11/2006    Obstructive pattern.  Poor response to bronchodilators.   . Thyroidectomy, partial  08/2008    Dr Ronnald Collum  . Transthoracic echocardiogram  1/0/2585    Normal systolic function.  EF 55-60%.   Mild MR, atria ok, trivial pericardial effusion  . Carotid ultrasound  09/10/09    No significant extracranial carotid artery stenosis, vertebrals are patent with antegrade flow.   . Mri head  09/20/09    No acute intracranial abn.  Signal abnormality suggestive of chronic small vessel ischemia, maximal in the right subinsular white and deep gray mater.   . Tvt  06/15/10    Tension-free Vaginal Tape, Dr Mancel Bale, for urge incontinence  . Abdominal hysterectomy    . Bladder suspension      mesh sling   Family History  Problem Relation Age of Onset  . Heart disease Mother   . Heart disease Maternal Grandmother   . Heart disease Maternal Grandfather   . Cirrhosis Sister   . Hypertension Sister   . Other Neg Hx    History  Substance Use Topics  . Smoking status: Former Smoker -- 0.20 packs/day for 35 years    Types: Cigarettes    Quit date: 05/25/2012  . Smokeless tobacco: Never Used     Comment: smokes less than 1 pack per week. ready to quitt.  . Alcohol Use: No   OB History    Gravida Para Term Preterm AB TAB SAB Ectopic Multiple Living   2         2     Review of Systems  Constitutional: Negative for fever, chills and fatigue.  HENT: Positive for congestion. Negative for trouble swallowing.  Eyes: Negative for visual disturbance.  Respiratory: Positive for cough. Negative for shortness of breath.   Cardiovascular: Negative for chest pain and palpitations.  Gastrointestinal: Negative for nausea, vomiting, abdominal pain and diarrhea.  Genitourinary: Negative for dysuria and difficulty urinating.  Musculoskeletal: Negative for arthralgias and neck pain.  Skin: Negative for color change.  Neurological: Negative for dizziness and weakness.  Psychiatric/Behavioral: Negative for dysphoric mood.      Allergies  Amoxicillin; Ciprofloxacin; Haloperidol lactate; Ketorolac tromethamine; Latuda; Seroquel; and Wellbutrin  Home Medications   Prior to Admission medications    Medication Sig Start Date End Date Taking? Authorizing Provider  albuterol (PROVENTIL HFA;VENTOLIN HFA) 108 (90 BASE) MCG/ACT inhaler Inhale 2 puffs into the lungs every 4 (four) hours as needed for wheezing or shortness of breath (OR TIGHTNESS). 08/11/13  Yes Margarita Mail, PA-C  ARIPiprazole (ABILIFY) 2 MG tablet  11/21/13  Yes Historical Provider, MD  aspirin 325 MG tablet Take 162.5 mg by mouth See admin instructions. Take 1/2 tablet (162.5 mg) every morning, take an additional 1/2 tablet (162.5 mg) in the evening if needed for pain   Yes Historical Provider, MD  azithromycin (ZITHROMAX) 250 MG tablet Take two pills on day 1, then one pill a day for days 2-5. 05/27/14  Yes Sharon [address], MD  dextromethorphan (DELSYM) 30 MG/5ML liquid Take 10 mLs (60 mg total) by mouth 2 (two) times daily. For cough 06/12/14  Yes Billy Fischer, MD  ipratropium (ATROVENT) 0.06 % nasal spray Place 2 sprays into both nostrils 4 (four) times daily. 06/12/14  Yes Billy Fischer, MD  levothyroxine (SYNTHROID, LEVOTHROID) 75 MCG tablet Take 1 tablet (75 mcg total) by mouth daily before breakfast. 09/26/13  Yes [name], NP  lisinopril (PRINIVIL,ZESTRIL) 5 MG tablet TAKE 1 TABLET (5 MG TOTAL) BY MOUTH DAILY. 10/12/13  Yes Frazier Richards, MD  minocycline (MINOCIN,DYNACIN) 100 MG capsule Take 1 capsule (100 mg total) by mouth 2 (two) times daily. 06/12/14  Yes Billy Fischer, MD  promethazine (PHENERGAN) 25 MG tablet TAKE 1/2 TABLET (12.5 MG TOTAL) BY MOUTH EVERY 8 (EIGHT) HOURS AS NEEDED FOR NAUSEA OR VOMITING. 01/03/14  Yes Frazier Richards, MD  venlafaxine XR (EFFEXOR-XR) 150 MG 24 hr capsule Take 1 capsule (150 mg total) by mouth daily with breakfast. 09/26/13  Yes [name], NP  vitamin C (ASCORBIC ACID) 500 MG tablet Take 6 tablets (3,000 mg total) by mouth daily. 09/26/13  Yes [name], NP  vitamin E 400 UNIT capsule Take 2 capsules (800 Units total) by mouth daily. 09/26/13  Yes [name], NP  zolpidem  (AMBIEN) 5 MG tablet Take 1 tablet (5 mg total) by mouth at bedtime as needed for sleep. 08/20/13  Yes Lupita Dawn, MD   BP 166/89 mmHg  Pulse 94  SpO2 96%  LMP 01/09/2012 Physical Exam  Constitutional: She is oriented to person, place, and time. She appears well-developed and well-nourished. No distress.  HENT:  Head: Normocephalic and atraumatic.  Eyes: Conjunctivae and EOM are normal.  Neck: Normal range of motion.  Cardiovascular: Normal rate and regular rhythm.  Exam reveals no gallop and no friction rub.   No murmur heard. Pulmonary/Chest: She has wheezes. She has no rales. She exhibits no tenderness.  Increased breathing effort. Inspiratory and expiratory wheezing and rhonchi in all lung fields.   Abdominal: Soft. She exhibits no distension. There is no tenderness. There is no rebound.  Musculoskeletal: Normal range of motion.  No lower extremity edema.   Neurological: She is alert and oriented to person, place, and time. Coordination normal.  Speech is goal-oriented. Moves limbs without ataxia.   Skin: Skin is warm and dry.  Psychiatric: She has a normal mood and affect. Her behavior is normal.  Nursing note and vitals reviewed.   ED Course  Procedures (including critical care time) Labs Review Labs Reviewed  CBC - Abnormal; Notable for the following:    RBC 3.76 (*)    All other components within normal limits  COMPREHENSIVE METABOLIC PANEL - Abnormal; Notable for the following:    Sodium 133 (*)    Chloride 95 (*)    Glucose, Bld 115 (*)    Total Protein 5.9 (*)    GFR calc non Af Amer 83 (*)    All other components within normal limits  INFLUENZA PANEL BY PCR (TYPE A & B, H1N1)  I-STAT TROPOININ, ED    Imaging Review Dg Chest 2 View  06/13/2014   CLINICAL DATA:  Shortness of breath, cough, influenza, COPD, asthma  EXAM: CHEST  2 VIEW  COMPARISON:  08/11/2013  FINDINGS: Normal heart size, mediastinal contours, and pulmonary vascularity.  Atherosclerotic  calcification aorta.  Emphysematous and bronchitic changes consistent with COPD.  No acute infiltrate, pleural effusion or pneumothorax.  Bones diffusely demineralized.  IMPRESSION: COPD changes.  No acute abnormalities.   Electronically Signed   By: Lavonia Dana M.D.   On: 06/13/2014 09:14     EKG Interpretation   Date/Time:  Thursday June 13 2014 08:17:48 EST Ventricular Rate:  94 PR Interval:  209 QRS Duration: 81 QT Interval:  419 QTC Calculation: 524 R Axis:   75 Text Interpretation:  Sinus rhythm Right atrial enlargement Anteroseptal  infarct, age indeterminate Prolonged QT interval No significant change  since last tracing Confirmed by Kathrynn Humble, MD, Thelma Comp [phone]) on 06/13/2014  8:35:04 AM      MDM   Final diagnoses:  COPD exacerbation    8:53 AM Chest xray and labs pending. Patient on 5L nasal cannula currently.   Patient will be admitted for oxygen dependence secondary to COPD exacerbation.   Alvina Chou, PA-C 06/13/14 [place], MD 06/15/14 1702

## 2014-06-13 NOTE — Progress Notes (Signed)
Report received from ED RN Elmyra Ricks. Afleming, RN

## 2014-06-13 NOTE — Progress Notes (Signed)
Patient arrived from the ED. Patient A/O, VSS, NSR on the monitor, pain 0/10. POC discussed, patient verbalized understanding. Patient currently resting comfortably in bed. Afleming, RN

## 2014-06-13 NOTE — ED Notes (Signed)
Ambulated patient in hall. Patient O2 level prior to ambulation was 83% with HR 96. During ambulation patient O2 level stayed between 82-84% with HR between 95-108. O2 level at 83% at rest in exam room. Placed pt on 2lpm via [place] upon return to exam room.
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
## 2014-06-14 LAB — BASIC METABOLIC PANEL
Anion gap: 7 (ref 5–15)
BUN: 13 mg/dL (ref 6–23)
CO2: 30 mmol/L (ref 19–32)
Calcium: 8 mg/dL — ABNORMAL LOW (ref 8.4–10.5)
Chloride: 96 mmol/L (ref 96–112)
Creatinine, Ser: 0.86 mg/dL (ref 0.50–1.10)
GFR calc Af Amer: 78 mL/min — ABNORMAL LOW (ref >90)
GFR calc non Af Amer: 67 mL/min — ABNORMAL LOW (ref >90)
Glucose, Bld: 97 mg/dL (ref 70–99)
Potassium: 3.3 mmol/L — ABNORMAL LOW (ref 3.5–5.1)
Sodium: 133 mmol/L — ABNORMAL LOW (ref 135–145)

## 2014-06-14 MED ORDER — MUSCLE RUB 10-15 % EX CREA
TOPICAL_CREAM | CUTANEOUS | Status: DC | PRN
  Administered 2014-06-14: 1 via TOPICAL
  Filled 2014-06-14: qty 85

## 2014-06-14 NOTE — Progress Notes (Signed)
Placed pt back on her 3L [place].

## 2014-06-14 NOTE — Progress Notes (Signed)
Family Medicine Teaching Service Daily Progress Note Intern Pager: [phone]  Patient name: [name] Medical record number: [id number] Date of birth: Aug 26, 1945 Age: 69 y.o. Gender: female  Primary Care Provider: Beverlyn Roux, MD Consultants: RT Code Status: Full  Pt Overview and Major Events to Date:  3/10 Presented with increasing SOB, likely COPD exacerbation 2/2 influenza  Assessment and Plan: [name] is a 69 y.o. female presenting with acute COPD exacerbation. PMH is significant for COPD, hypertension, hyperlipidemia, history of CVA, depression/anxiety/OCD/bipolar disorder, and hypothyroidism.  Acute COPD exacerbation - patient is recently been treated for acute bronchitis with some improvement. She has required supplemental oxygen. Patient remains afebrile. No leukocytosis on CBC. Chest x-ray negative for acute infiltrate but did show changes consistent with COPD. Troponin negative. EKG unremarkable. Given past medical history and tobacco abuse, her history and physical exam are consistent with acute COPD exacerbation. Influenza positive, started tamiflu 30 BID x 5 per renal dosing protocol. Patient desaturated to mid 82 during lunch without O2.  DuoNeb Q6H, Albuterol Q2 PRN,   Given Prednisone 60 mg in the ED. Will continue w/ Prednisone 50 mg x 5 days.  Supplemental O2 as needed  Tamiflu 30mg  BID x 5 days  Patient not on any home medications for COPD. Will need albuterol and inhaled corticosteroid or Spiriva when she is discharged.  HTN  Continuing home lisinopril.  History of CVA  Aspirin 81 mg daily.  Psych - patient with several psych diagnosis problem list.  Will continue home Abilify and Effexor.  Continuing home Ambien for insomnia.  Hypothyroidism  Continuing home Synthroid.  HLD  Last Lipid panel was 2014.  Consider reassessing during admission.  Patient in need of statin given risk factors and prior CVA.  FEN/GI: NS @ 75 mL/hr. Heart  Healthy diet. Prophylaxis: Lovenox  Disposition: Pending respiratory improvement, anticipate discharge tomorrow.  Subjective:  Patient feels improvement with SOB at rest today. States she has been able to get up some yellow sputum, and that coughing doesn't hurt too much. She has a good appetite and denies nausea / vomiting. Endorses chronic back pain and asks for back muscle rub and advice about stretching.   Objective: Temp:  [98.5 F (36.9 C)-99.3 F (37.4 C)] 99.3 F (37.4 C) (03/11 0418) Pulse Rate:  [89-96] 96 (03/11 0418) Resp:  [18] 18 (03/11 0418) BP: (142-145)/(71-84) 145/71 mmHg (03/11 0418) SpO2:  [82 %-100 %] 93 % (03/11 1247) Physical Exam: Gen: Elderly female sitting on side of bed, NAD HEENT: NCAT, EOMI, MMM Neck: Supple Heart: RRR Lungs: Scattered wheezing in all lung fields,diminished sounds at bases. No increased WOB or accessory muscle use Ext: Thin, no edema  Laboratory:  Recent Labs Lab 06/13/14 0841  WBC 5.0  HGB 12.1  HCT 37.4  PLT 212    Recent Labs Lab 06/13/14 0841 06/14/14 0500  NA 133* 133*  K 3.6 3.3*  CL 95* 96  CO2 31 30  BUN 7 13  CREATININE 0.76 0.86  CALCIUM 8.4 8.0*  PROT 5.9*  --   BILITOT 0.6  --   ALKPHOS 103  --   ALT 14  --   AST 23  --   GLUCOSE 115* 97    Imaging/Diagnostic Tests: CXR 3/10 showed COPD changes with no acute abnormalities  Grandville Silos, Med Student 06/14/2014, 1:47 PM [facility]   I agree with the medical student note above and have edited it as necessary. I formulated the plan and performed  my own physical exam which are documented above.  Beverlyn Roux, MD, MPH [facility] PGY-2 06/14/2014 3:19 PM [place] Intern pager: [phone], text pages welcome

## 2014-06-14 NOTE — Progress Notes (Signed)
Patient on RA eating lunch at 02 sat 82%. Placed back on O2 3L sating >93%.

## 2014-06-14 NOTE — Discharge Summary (Signed)
[facility] Discharge Summary  Patient name: [name] Medical record number: [id number] Date of birth: 26-Jul-1945 Age: 69 y.o. Gender: female Date of Admission: 06/13/2014  Date of Discharge: 06/15/2014 Admitting Physician: Alveda Reasons, MD  Primary Care Provider: Beverlyn Roux, MD Consultants: Respiratory Therapy  Indication for Hospitalization: Shortness of breath   Discharge Diagnoses/Problem List:  Patient Active Problem List   Diagnosis Date Noted  . Acute exacerbation of chronic obstructive pulmonary disease (COPD) 06/13/2014  . COPD exacerbation   . Essential hypertension   . Thyroid activity decreased   . HLD (hyperlipidemia)   . Healthcare maintenance 05/31/2014  . Emotional neurotic disorder 05/07/2014  . Seborrheic keratosis 12/05/2013  . Depression, major, severe recurrence 07/22/2013  . 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 At high risk for falls 06/28/2012  . Allergic rhinitis 06/16/2012  . Hearing loss 01/07/2011  . Foot pain 11/25/2010  . Thyroid nodule 10/16/2010  . URINARY INCONTINENCE, STRESS, FEMALE 04/03/2010  . HYPERLIPIDEMIA 03/25/2010  . HYPERTENSION 03/25/2010  . PULMONARY NODULE 12/23/2009  . COPD 12/03/2009  . Tobacco abuse counseling 11/05/2009  . CVA 11/05/2009  . BIPOLAR DISORDER UNSPECIFIED 11/28/2008  . HYPOTHYROIDISM 05/06/2008  . ANXIETY DISORDER 10/13/2006  . OBSESSIVE-COMPULSIVE DISORDER 10/13/2006  . GERD 10/13/2006  . OSTEOPOROSIS 10/13/2006    Disposition: Discharge to home, follow up with PCP  Discharge Condition: Stable  Discharge Exam: Filed Vitals:   06/14/14 2122 06/15/14 0018 06/15/14 0500 06/15/14 0900  BP: 92/45 108/57 115/58   Pulse: 80 68 72   Temp: 98 F (36.7 C)  98.1 F (36.7 C)   TempSrc: Oral  Oral   Resp: 17  18   Height:      Weight:      SpO2: 90%  96% 95%   Gen: Elderly female sitting on side of bed, NAD HEENT: NCAT, EOMI, MMM Neck: Supple Heart:  RRR Lungs: Few scattered wheezing in all lung fields, good aeration throughout No increased WOB or accessory muscle use Ext: Thin, no edema  Brief Hospital Course:  Ms. Sandoval presented with an acute COPD exacerbation. PMH is significant for COPD, hypertension, hyperlipidemia, history of CVA, depression/anxiety/OCD/bipolar disorder, and hypothyroidism.  #Influenza A: Found to be positive during admission. Given tamiflu upon discharge.   #Acute COPD exacerbation  The patient presented with shortness of breath, found to have a COPD exacerbation. She required supplemental oxygen that was weaned over her admission, but she desaturated in the hall to 87% and discharged on home oxygen. She remained afebrile without leukocytosis on CBC. A chest x-ray was negative for acute infiltrate but did show changes consistent with COPD. ACS workup including troponin and EKG were unremarkable. She was started on prednisone course of 50mg  x 5 days, and given several Duonebs. On discharge, she was started on an inhaled corticosteroid and continued on her Atrovent.  #Hyperlipidemia The patient's last lipid panel was in 2014. Given her risk factors and prior CVA, the patient was started on Lipitor 40mg  daily.  The patient was continued on her home medications for Hypertension, History of CVA, Psych diagnoses, and Hypothyroidism.  Issues for Follow Up:   Patient d/c'ed on home O2, determine if needed long-term  COPD Meds - patient presented with an exacerbation of COPD, but did not take home meds for this. Now has ICS and atrovent, will finish a prednisone and tamiflu course.  Lipid panel - last 2014, just started on Lipitor  Significant Procedures: none  Significant Labs and  Imaging:   Recent Labs Lab 06/13/14 0841  WBC 5.0  HGB 12.1  HCT 37.4  PLT 212    Recent Labs Lab 06/13/14 0841 06/14/14 0500  NA 133* 133*  K 3.6 3.3*  CL 95* 96  CO2 31 30  GLUCOSE 115* 97  BUN 7 13  CREATININE 0.76  0.86  CALCIUM 8.4 8.0*  ALKPHOS 103  --   AST 23  --   ALT 14  --   ALBUMIN 3.5  --     Results/Tests Pending at Time of Discharge: None  Discharge Medications:    Medication List    STOP taking these medications        albuterol 108 (90 BASE) MCG/ACT inhaler  Commonly known as:  PROVENTIL HFA;VENTOLIN HFA  Replaced by:  albuterol (2.5 MG/3ML) 0.083% nebulizer solution     azithromycin 250 MG tablet  Commonly known as:  ZITHROMAX     dextromethorphan 30 MG/5ML liquid  Commonly known as:  DELSYM     minocycline 100 MG capsule  Commonly known as:  MINOCIN,DYNACIN      TAKE these medications        albuterol (2.5 MG/3ML) 0.083% nebulizer solution  Commonly known as:  PROVENTIL  Take 3 mLs (2.5 mg total) by nebulization every 4 (four) hours as needed for wheezing or shortness of breath.     ARIPiprazole 2 MG tablet  Commonly known as:  ABILIFY     aspirin 325 MG tablet  Take 162.5 mg by mouth See admin instructions. Take 1/2 tablet (162.5 mg) every morning, take an additional 1/2 tablet (162.5 mg) in the evening if needed for pain     atorvastatin 40 MG tablet  Commonly known as:  LIPITOR  Take 1 tablet (40 mg total) by mouth daily.     budesonide 0.5 MG/2ML nebulizer solution  Commonly known as:  PULMICORT  Take 2 mLs (0.5 mg total) by nebulization 2 (two) times daily.     ipratropium 0.06 % nasal spray  Commonly known as:  ATROVENT  Place 2 sprays into both nostrils 4 (four) times daily.     levothyroxine 75 MCG tablet  Commonly known as:  SYNTHROID, LEVOTHROID  Take 1 tablet (75 mcg total) by mouth daily before breakfast.     lisinopril 5 MG tablet  Commonly known as:  PRINIVIL,ZESTRIL  TAKE 1 TABLET (5 MG TOTAL) BY MOUTH DAILY.     oseltamivir 30 MG capsule  Commonly known as:  TAMIFLU  Take 1 capsule (30 mg total) by mouth 2 (two) times daily.     predniSONE 50 MG tablet  Commonly known as:  DELTASONE  Take 1 tablet (50 mg total) by mouth daily  with breakfast.     promethazine 25 MG tablet  Commonly known as:  PHENERGAN  TAKE 1/2 TABLET (12.5 MG TOTAL) BY MOUTH EVERY 8 (EIGHT) HOURS AS NEEDED FOR NAUSEA OR VOMITING.     venlafaxine XR 150 MG 24 hr capsule  Commonly known as:  EFFEXOR-XR  Take 1 capsule (150 mg total) by mouth daily with breakfast.     vitamin C 500 MG tablet  Commonly known as:  ASCORBIC ACID  Take 6 tablets (3,000 mg total) by mouth daily.     vitamin E 400 UNIT capsule  Take 2 capsules (800 Units total) by mouth daily.     zolpidem 5 MG tablet  Commonly known as:  AMBIEN  Take 1 tablet (5 mg total) by mouth at bedtime  as needed for sleep.        Discharge Instructions: Please refer to Patient Instructions section of EMR for full details.  Patient was counseled important signs and symptoms that should prompt return to medical care, changes in medications, dietary instructions, activity restrictions, and follow up appointments.   Follow-Up Appointments:   Grandville Silos, Med Student 06/14/2014, 3:22 PM [facility]  Upper Level Addendum:  I have seen and evaluated this patient along with Mr. Grandville Silos and reviewed the above note, making necessary revisions in [facility].   Clearance Coots, MD Family Medicine PGY-2

## 2014-06-14 NOTE — Progress Notes (Signed)
Patient ambulated with RN, walker and on o2 in hallway. Tolerated well but took frequent breaks. After walk O2 sat 97-98% at 2L. Will keep on 2L and continue to monitor.

## 2014-06-15 MED ORDER — PREDNISONE 50 MG PO TABS: 50.0000 mg | ORAL_TABLET | Freq: Every day | ORAL | Status: DC

## 2014-06-15 MED ORDER — BUDESONIDE 0.5 MG/2ML IN SUSP: 0.5000 mg | Freq: Two times a day (BID) | RESPIRATORY_TRACT | Status: DC

## 2014-06-15 MED ORDER — OSELTAMIVIR PHOSPHATE 30 MG PO CAPS: 30.0000 mg | ORAL_CAPSULE | Freq: Two times a day (BID) | ORAL | Status: DC

## 2014-06-15 MED ORDER — ALBUTEROL SULFATE (2.5 MG/3ML) 0.083% IN NEBU: 2.5000 mg | INHALATION_SOLUTION | RESPIRATORY_TRACT | Status: DC | PRN

## 2014-06-15 MED ORDER — ATORVASTATIN CALCIUM 40 MG PO TABS: 40.0000 mg | ORAL_TABLET | Freq: Every day | ORAL | Status: DC

## 2014-06-15 NOTE — Discharge Instructions (Signed)
You are having worsening of your COPD caused by the flu. You should continue taking tamiflu for 3 more days. You take steroids and use your albuterol breathing treatments for the next 3 days as well. After that you will have pulmicort which you should use twice day every and albuterol as needed. Both of these will be through the nebulizer machine.  You should use your oxygen when moving around until you no longer feel short of breath or until you see [place] in clinic

## 2014-06-15 NOTE — Progress Notes (Signed)
SATURATION QUALIFICATIONS: (This note is used to comply with regulatory documentation for home oxygen)  Patient Saturations on Room Air at Rest = 92%   Patient Saturations on Room Air while Ambulating = 87%   Patient Saturations on 2 Liters of oxygen while Ambulating = 97%   Please briefly explain why patient needs home oxygen: Pt short of breath with ambulation and desats on room air.

## 2014-06-15 NOTE — Progress Notes (Signed)
  Family Medicine Teaching Service Daily Progress Note Intern Pager: [phone]  Patient name: [name] Medical record number: [id number] Date of birth: 1946-01-27 Age: 69 y.o. Gender: female  Primary Care Provider: Beverlyn Roux, MD Consultants: RT Code Status: Full  Pt Overview and Major Events to Date:  3/10 Presented with increasing SOB, likely COPD exacerbation 2/2 influenza  Assessment and Plan: [name] is a 69 y.o. female presenting with acute COPD exacerbation. PMH is significant for COPD, hypertension, hyperlipidemia, history of CVA, depression/anxiety/OCD/bipolar disorder, and hypothyroidism.  Acute COPD exacerbation - patient is recently been treated for acute bronchitis with some improvement. She has required supplemental oxygen. Patient remains afebrile. No leukocytosis on CBC. Chest x-ray negative for acute infiltrate but did show changes consistent with COPD. Troponin negative. EKG unremarkable. Given past medical history and tobacco abuse, her history and physical exam are consistent with acute COPD exacerbation. Influenza positive, started tamiflu 30 BID x 5 per renal dosing protocol.   DuoNeb Q6H, Albuterol Q2 PRN,   Given Prednisone 60 mg in the ED. Will continue w/ Prednisone 50 mg x 5 days.  Supplemental O2 as needed  Tamiflu 30mg  BID x 5 days  Patient not on any home medications for COPD. Will need albuterol and inhaled corticosteroid or Spiriva when she is discharged.  HTN  Continuing home lisinopril.  History of CVA  Aspirin 81 mg daily.  Psych - patient with several psych diagnosis problem list.  Will continue home Abilify and Effexor.  Continuing home Ambien for insomnia.  Hypothyroidism  Continuing home Synthroid.  HLD  Last Lipid panel was 2014. Has been on simvastatin per records, but not since 2013.  Patient in need of statin given risk factors and prior CVA.  FEN/GI: KVO, Heart Healthy diet. Prophylaxis:  Lovenox  Disposition: Pending respiratory improvement, anticipate discharge today vs. tomorrow.  Subjective:  Patient feels improvement in SOB today. Wants to go home but is afraid of having O2 at home. Will need to ambulate off O2 prior to discharge  Objective: Temp:  [98 F (36.7 C)-98.3 F (36.8 C)] 98.1 F (36.7 C) (03/12 0500) Pulse Rate:  [68-81] 72 (03/12 0500) Resp:  [17-18] 18 (03/12 0500) BP: (92-125)/(45-67) 115/58 mmHg (03/12 0500) SpO2:  [82 %-97 %] 96 % (03/12 0500) Physical Exam Gen: Elderly female sitting on side of bed, NAD HEENT: NCAT, EOMI, MMM Neck: Supple Heart: RRR Lungs: Few scattered wheezing in all lung fields, good aeration throughout No increased WOB or accessory muscle use Ext: Thin, no edema  Laboratory:  Recent Labs Lab 06/13/14 0841  WBC 5.0  HGB 12.1  HCT 37.4  PLT 212    Recent Labs Lab 06/13/14 0841 06/14/14 0500  NA 133* 133*  K 3.6 3.3*  CL 95* 96  CO2 31 30  BUN 7 13  CREATININE 0.76 0.86  CALCIUM 8.4 8.0*  PROT 5.9*  --   BILITOT 0.6  --   ALKPHOS 103  --   ALT 14  --   AST 23  --   GLUCOSE 115* 97    Imaging/Diagnostic Tests: CXR 3/10 showed COPD changes with no acute abnormalities  Frazier Richards, MD 06/15/2014, 8:06 AM [place] [facility] Intern pager: [phone], text pages welcome

## 2014-06-15 NOTE — Progress Notes (Signed)
Pt refusing home oxygen. MD paged and to call pt on phone in room. Will continue to monitor.

## 2014-06-15 NOTE — Progress Notes (Signed)
Pt discharged per MD order and protocol. Discharge instructions reviewed with patient and all questions answered. Pt aware all prescriptions have been called into her pharmacy and will need to be picked up. Pt aware of follow up appointments. Pt sent home with oxygen.

## 2014-06-18 ENCOUNTER — Ambulatory Visit: Payer: Self-pay | Admitting: Family Medicine

## 2014-06-21 ENCOUNTER — Ambulatory Visit: Payer: Self-pay | Admitting: Family Medicine

## 2014-06-23 ENCOUNTER — Telehealth: Payer: Self-pay | Admitting: Family Medicine

## 2014-06-23 NOTE — Telephone Encounter (Signed)
Pt called after hours line about having fever.  Pt states she was recently d/c from hospital with influenza.  Pt denies CP, SOB, productive cough, myalgias, dysuria, hematuria, increased frequency, abdominal pain, recent sick contacts.  States fever up to 101 F last night but since then has not taken temp.  Will not take tylenol due to concern for liver issue and will not take ibuprofen due to concern for renal issues.  Recommended she take her aspirin then and call for SDA tomorrow AM unless de-compensates later today.  Pt voiced understanding and if recalcitrant fevers > 101 for > 4-6 hours, unable to keep fluids/food down, etc needs to be seen earlier.  At this point, states she has normal eating and PO intake and normal UOP.   [name], DO of [facility] 06/23/2014, 12:02 PM

## 2014-06-24 ENCOUNTER — Emergency Department (HOSPITAL_COMMUNITY): Payer: Medicare Other

## 2014-06-24 ENCOUNTER — Emergency Department (HOSPITAL_COMMUNITY)
Admission: EM | Admit: 2014-06-24 | Discharge: 2014-06-24 | Disposition: A | Discharge status: Home / Self Care | Payer: Medicare Other | Attending: Emergency Medicine | Admitting: Emergency Medicine

## 2014-06-24 ENCOUNTER — Encounter (HOSPITAL_COMMUNITY): Payer: Self-pay | Admitting: Emergency Medicine

## 2014-06-24 DIAGNOSIS — Z87891 Personal history of nicotine dependence: Secondary | ICD-10-CM | POA: Diagnosis not present

## 2014-06-24 DIAGNOSIS — E876 Hypokalemia: Secondary | ICD-10-CM

## 2014-06-24 DIAGNOSIS — J441 Chronic obstructive pulmonary disease with (acute) exacerbation: Secondary | ICD-10-CM | POA: Diagnosis not present

## 2014-06-24 DIAGNOSIS — R069 Unspecified abnormalities of breathing: Secondary | ICD-10-CM | POA: Diagnosis not present

## 2014-06-24 DIAGNOSIS — J449 Chronic obstructive pulmonary disease, unspecified: Secondary | ICD-10-CM | POA: Diagnosis not present

## 2014-06-24 DIAGNOSIS — Z8719 Personal history of other diseases of the digestive system: Secondary | ICD-10-CM | POA: Diagnosis not present

## 2014-06-24 DIAGNOSIS — Z8742 Personal history of other diseases of the female genital tract: Secondary | ICD-10-CM | POA: Insufficient documentation

## 2014-06-24 DIAGNOSIS — E860 Dehydration: Secondary | ICD-10-CM | POA: Insufficient documentation

## 2014-06-24 DIAGNOSIS — R011 Cardiac murmur, unspecified: Secondary | ICD-10-CM | POA: Insufficient documentation

## 2014-06-24 DIAGNOSIS — R509 Fever, unspecified: Secondary | ICD-10-CM | POA: Diagnosis not present

## 2014-06-24 DIAGNOSIS — Z8711 Personal history of peptic ulcer disease: Secondary | ICD-10-CM | POA: Diagnosis not present

## 2014-06-24 DIAGNOSIS — F419 Anxiety disorder, unspecified: Secondary | ICD-10-CM | POA: Diagnosis not present

## 2014-06-24 DIAGNOSIS — F329 Major depressive disorder, single episode, unspecified: Secondary | ICD-10-CM | POA: Diagnosis not present

## 2014-06-24 DIAGNOSIS — R531 Weakness: Secondary | ICD-10-CM | POA: Insufficient documentation

## 2014-06-24 DIAGNOSIS — Z880 Allergy status to penicillin: Secondary | ICD-10-CM | POA: Insufficient documentation

## 2014-06-24 LAB — CBC WITH DIFFERENTIAL/PLATELET
Basophils Absolute: 0 10*3/uL (ref 0.0–0.1)
Basophils Relative: 0 % (ref 0–1)
Eosinophils Absolute: 0.1 10*3/uL (ref 0.0–0.7)
Eosinophils Relative: 1 % (ref 0–5)
HCT: 39.2 % (ref 36.0–46.0)
Hemoglobin: 12.9 g/dL (ref 12.0–15.0)
Lymphocytes Relative: 20 % (ref 12–46)
Lymphs Abs: 1 10*3/uL (ref 0.7–4.0)
MCH: 32.2 pg (ref 26.0–34.0)
MCHC: 32.9 g/dL (ref 30.0–36.0)
MCV: 97.8 fL (ref 78.0–100.0)
Monocytes Absolute: 0.5 10*3/uL (ref 0.1–1.0)
Monocytes Relative: 10 % (ref 3–12)
Neutro Abs: 3.5 10*3/uL (ref 1.7–7.7)
Neutrophils Relative %: 69 % (ref 43–77)
Platelets: 252 10*3/uL (ref 150–400)
RBC: 4.01 MIL/uL (ref 3.87–5.11)
RDW: 13.6 % (ref 11.5–15.5)
WBC: 5 10*3/uL (ref 4.0–10.5)

## 2014-06-24 LAB — COMPREHENSIVE METABOLIC PANEL
ALT: 15 U/L (ref 0–35)
AST: 19 U/L (ref 0–37)
Albumin: 3.5 g/dL (ref 3.5–5.2)
Alkaline Phosphatase: 104 U/L (ref 39–117)
Anion gap: 9 (ref 5–15)
BUN: 8 mg/dL (ref 6–23)
CO2: 34 mmol/L — ABNORMAL HIGH (ref 19–32)
Calcium: 8.6 mg/dL (ref 8.4–10.5)
Chloride: 90 mmol/L — ABNORMAL LOW (ref 96–112)
Creatinine, Ser: 0.77 mg/dL (ref 0.50–1.10)
GFR calc Af Amer: >90 mL/min (ref >90)
GFR calc non Af Amer: 83 mL/min — ABNORMAL LOW (ref >90)
Glucose, Bld: 120 mg/dL — ABNORMAL HIGH (ref 70–99)
Potassium: 3.1 mmol/L — ABNORMAL LOW (ref 3.5–5.1)
Sodium: 133 mmol/L — ABNORMAL LOW (ref 135–145)
Total Bilirubin: 0.6 mg/dL (ref 0.3–1.2)
Total Protein: 6 g/dL (ref 6.0–8.3)

## 2014-06-24 LAB — TROPONIN I: Troponin I: <0.03 ng/mL (ref <0.031)

## 2014-06-24 MED ORDER — SODIUM CHLORIDE 0.9 % IV SOLN
1000.0000 mL | Freq: Once | INTRAVENOUS | Status: AC
  Administered 2014-06-24: 1000 mL via INTRAVENOUS

## 2014-06-24 MED ORDER — POTASSIUM CHLORIDE CRYS ER 20 MEQ PO TBCR: 20.0000 meq | EXTENDED_RELEASE_TABLET | Freq: Two times a day (BID) | ORAL | Status: DC

## 2014-06-24 MED ORDER — ONDANSETRON 8 MG PO TBDP: 8.0000 mg | ORAL_TABLET | Freq: Three times a day (TID) | ORAL | Status: DC | PRN

## 2014-06-24 MED ORDER — SODIUM CHLORIDE 0.9 % IV SOLN
1000.0000 mL | INTRAVENOUS | Status: DC
Start: 2014-06-24 — End: 2014-06-24
  Administered 2014-06-24: 1000 mL via INTRAVENOUS

## 2014-06-24 NOTE — ED Notes (Signed)
Pt back from the bathroom, placed back on the monitor and O2

## 2014-06-24 NOTE — ED Provider Notes (Signed)
CSN: [id number]     Arrival date & time 06/24/14  2800 History   First MD Initiated Contact with Patient 06/24/14 [id number]     Chief Complaint  Patient presents with  . Weakness  . Fever  . Wheezing     HPI Patient ports upper respiratory symptoms over the past 2-3 weeks and reports being treated by her primary care doctor with azithromycin as well as Tamiflu for possible influenza.  She states despite finishing all of her treatment she continues to not feel good.  She reports intermittent fever and generalized weakness.  Patient has a history of COPD and reports that she's given a breathing treatment and Solu-Medrol by EMS with improvement in her breathing.  She now denies any shortness of breath.  She denies productive cough.  She does report some nausea which was also treated by EMS and seems to be improving.  She reports decreased oral intake over the past 24 hours and darker urine.  His episode heart rate   Past Medical History  Diagnosis Date  . OCD (obsessive compulsive disorder)   . Depression   . Anxiety   . PUD (peptic ulcer disease) 09/2006    H pylori  . Diverticulosis of colon   . GERD (gastroesophageal reflux disease)   . COPD (chronic obstructive pulmonary disease)     Due to long history of tobacco abuse, still smoking  . History of uterine prolapse 10/2004    Dr Cletis Media  . Thyroid disease     Ho R thyroid noduel plus mild hyperthyroidism. Treated with radioiodine therapty on 04/2006. Dr Estrella Myrtle.  [name] Heart murmur   . Osteoporosis    Past Surgical History  Procedure Laterality Date  . Colonoscopy  06/03/2005    Hyperplastic polyps, sigmoid diverticulosis, internal hemorroids  . Ganglion removal  12/2005    R wrist subretinacular dorsal ganglion  . Biopsy thyroid  08/2008    Atypia and Hurtle cells, partial thyroidectomy   . Tonsillectomy and adenoidectomy  10/2004  . Pft  11/2006    Obstructive pattern.  Poor response to bronchodilators.   . Thyroidectomy, partial  08/2008   Dr Ronnald Collum  . Transthoracic echocardiogram  10/12/1503    Normal systolic function.  EF 55-60%.  Mild MR, atria ok, trivial pericardial effusion  . Carotid ultrasound  09/10/09    No significant extracranial carotid artery stenosis, vertebrals are patent with antegrade flow.   . Mri head  09/20/09    No acute intracranial abn.  Signal abnormality suggestive of chronic small vessel ischemia, maximal in the right subinsular white and deep gray mater.   . Tvt  06/15/10    Tension-free Vaginal Tape, Dr Mancel Bale, for urge incontinence  . Abdominal hysterectomy    . Bladder suspension      mesh sling   Family History  Problem Relation Age of Onset  . Heart disease Mother   . Heart disease Maternal Grandmother   . Heart disease Maternal Grandfather   . Cirrhosis Sister   . Hypertension Sister   . Other Neg Hx    History  Substance Use Topics  . Smoking status: Former Smoker -- 0.20 packs/day for 35 years    Types: Cigarettes    Quit date: 05/25/2012  . Smokeless tobacco: Never Used     Comment: smokes less than 1 pack per week. ready to quitt.  . Alcohol Use: No   OB History    Gravida Para Term Preterm AB TAB SAB Ectopic Multiple  Living   2         2     Review of Systems    Allergies  Amoxicillin; Ciprofloxacin; Haloperidol lactate; Ketorolac tromethamine; Latuda; Seroquel; and Wellbutrin  Home Medications   Prior to Admission medications   Medication Sig Start Date End Date Taking? Authorizing Provider  albuterol (PROVENTIL) (2.5 MG/3ML) 0.083% nebulizer solution Take 3 mLs (2.5 mg total) by nebulization every 4 (four) hours as needed for wheezing or shortness of breath. 06/15/14  Yes Frazier Richards, MD  ARIPiprazole (ABILIFY) 2 MG tablet Take 2 mg by mouth daily.  11/21/13  Yes Historical Provider, MD  aspirin 325 MG tablet Take 162.5-325 mg by mouth daily as needed for mild pain, moderate pain or headache.    Yes Historical Provider, MD  budesonide (PULMICORT) 0.5 MG/2ML  nebulizer solution Take 2 mLs (0.5 mg total) by nebulization 2 (two) times daily. 06/15/14  Yes Frazier Richards, MD  levothyroxine (SYNTHROID, LEVOTHROID) 75 MCG tablet Take 1 tablet (75 mcg total) by mouth daily before breakfast. 09/26/13  Yes [name], NP  lisinopril (PRINIVIL,ZESTRIL) 5 MG tablet TAKE 1 TABLET (5 MG TOTAL) BY MOUTH DAILY. 10/12/13  Yes Frazier Richards, MD  predniSONE (DELTASONE) 50 MG tablet Take 1 tablet (50 mg total) by mouth daily with breakfast. 06/15/14  Yes Frazier Richards, MD  promethazine (PHENERGAN) 25 MG tablet TAKE 1/2 TABLET (12.5 MG TOTAL) BY MOUTH EVERY 8 (EIGHT) HOURS AS NEEDED FOR NAUSEA OR VOMITING. 01/03/14  Yes Frazier Richards, MD  venlafaxine XR (EFFEXOR-XR) 75 MG 24 hr capsule Take 225 mg by mouth daily. 05/20/14  Yes Historical Provider, MD  vitamin C (ASCORBIC ACID) 500 MG tablet Take 6 tablets (3,000 mg total) by mouth daily. 09/26/13  Yes [name], NP  vitamin E 400 UNIT capsule Take 2 capsules (800 Units total) by mouth daily. 09/26/13  Yes [name], NP  zolpidem (AMBIEN) 5 MG tablet Take 1 tablet (5 mg total) by mouth at bedtime as needed for sleep. 08/20/13  Yes Lupita Dawn, MD  atorvastatin (LIPITOR) 40 MG tablet Take 1 tablet (40 mg total) by mouth daily. Patient not taking: Reported on 06/24/2014 06/15/14   Frazier Richards, MD  ipratropium (ATROVENT) 0.06 % nasal spray Place 2 sprays into both nostrils 4 (four) times daily. Patient not taking: Reported on 06/24/2014 06/12/14   Billy Fischer, MD  ondansetron (ZOFRAN ODT) 8 MG disintegrating tablet Take 1 tablet (8 mg total) by mouth every 8 (eight) hours as needed for nausea or vomiting. 06/24/14   Jola Schmidt, MD  oseltamivir (TAMIFLU) 30 MG capsule Take 1 capsule (30 mg total) by mouth 2 (two) times daily. Patient not taking: Reported on 06/24/2014 06/15/14   Frazier Richards, MD  potassium chloride SA (K-DUR,KLOR-CON) 20 MEQ tablet Take 1 tablet (20 mEq total) by mouth 2 (two) times daily. 06/24/14   Jola Schmidt, MD  venlafaxine XR (EFFEXOR-XR) 150 MG 24 hr capsule Take 1 capsule (150 mg total) by mouth daily with breakfast. Patient not taking: Reported on 06/24/2014 09/26/13   [name], NP   BP 158/107 mmHg  Pulse 98  Temp(Src) 98 F (36.7 C) (Oral)  Resp 20  Ht 4\' 11"  (1.499 m)  Wt 112 lb (50.803 kg)  BMI 22.61 kg/m2  SpO2 92%  LMP 01/09/2012 Physical Exam  ED Course  Procedures (including critical care time) Labs Review Labs Reviewed  COMPREHENSIVE METABOLIC PANEL -  Abnormal; Notable for the following:    Sodium 133 (*)    Potassium 3.1 (*)    Chloride 90 (*)    CO2 34 (*)    Glucose, Bld 120 (*)    GFR calc non Af Amer 83 (*)    All other components within normal limits  CBC WITH DIFFERENTIAL/PLATELET  TROPONIN I    Imaging Review Dg Chest 2 View  06/24/2014   CLINICAL DATA:  Weakness, fever, and wheezing.  EXAM: CHEST  2 VIEW  COMPARISON:  06/13/2014  FINDINGS: There is no cardiomegaly. Mild aortic tortuosity is stable from prior. Pulmonary hyperinflation and mild interstitial coarsening. There is no edema, consolidation, effusion, or pneumothorax. No acute osseous findings. Remote surgery at the thoracic inlet on the left.  IMPRESSION: COPD without acute superimposed disease.   Electronically Signed   By: Monte Fantasia M.D.   On: 06/24/2014 09:31     EKG Interpretation   Date/Time:  Monday June 24 2014 08:19:01 EDT Ventricular Rate:  94 PR Interval:  176 QRS Duration: 96 QT Interval:  399 QTC Calculation: 499 R Axis:   76 Text Interpretation:  Sinus rhythm Right atrial enlargement Anteroseptal  infarct, old No significant change was found Confirmed by Jashawna Reever  MD,  Lennette Bihari ([id number]) on 06/24/2014 9:01:19 AM      MDM   Final diagnoses:  Weakness  Dehydration  Hypokalemia    Patient is feeling better at this time.  Vital signs are normal.  Mild hypokalemia.  This will be repleted as an outpatient.  Primary care follow-up.  Likely viral illness.   Hydrated.  Home with nausea medicine.  Repeat abdominal exam is benign.  Mental status is normal.  Discharge home.    Jola Schmidt, MD 06/24/14 [phone]

## 2014-06-24 NOTE — Discharge Instructions (Signed)
Dehydration, Adult Dehydration is when you lose more fluids from the body than you take in. Vital organs like the kidneys, brain, and heart cannot function without a proper amount of fluids and salt. Any loss of fluids from the body can cause dehydration.  CAUSES   Vomiting.  Diarrhea.  Excessive sweating.  Excessive urine output.  Fever. SYMPTOMS  Mild dehydration  Thirst.  Dry lips.  Slightly dry mouth. Moderate dehydration  Very dry mouth.  Sunken eyes.  Skin does not bounce back quickly when lightly pinched and released.  Dark urine and decreased urine production.  Decreased tear production.  Headache. Severe dehydration  Very dry mouth.  Extreme thirst.  Rapid, weak pulse (more than 100 beats per minute at rest).  Cold hands and feet.  Not able to sweat in spite of heat and temperature.  Rapid breathing.  Blue lips.  Confusion and lethargy.  Difficulty being awakened.  Minimal urine production.  No tears. DIAGNOSIS  Your caregiver will diagnose dehydration based on your symptoms and your exam. Blood and urine tests will help confirm the diagnosis. The diagnostic evaluation should also identify the cause of dehydration. TREATMENT  Treatment of mild or moderate dehydration can often be done at home by increasing the amount of fluids that you drink. It is best to drink small amounts of fluid more often. Drinking too much at one time can make vomiting worse. Refer to the home care instructions below. Severe dehydration needs to be treated at the hospital where you will probably be given intravenous (IV) fluids that contain water and electrolytes. HOME CARE INSTRUCTIONS   Ask your caregiver about specific rehydration instructions.  Drink enough fluids to keep your urine clear or pale yellow.  Drink small amounts frequently if you have nausea and vomiting.  Eat as you normally do.  Avoid:  Foods or drinks high in sugar.  Carbonated  drinks.  Juice.  Extremely hot or cold fluids.  Drinks with caffeine.  Fatty, greasy foods.  Alcohol.  Tobacco.  Overeating.  Gelatin desserts.  Wash your hands well to avoid spreading bacteria and viruses.  Only take over-the-counter or prescription medicines for pain, discomfort, or fever as directed by your caregiver.  Ask your caregiver if you should continue all prescribed and over-the-counter medicines.  Keep all follow-up appointments with your caregiver. SEEK MEDICAL CARE IF:  You have abdominal pain and it increases or stays in one area (localizes).  You have a rash, stiff neck, or severe headache.  You are irritable, sleepy, or difficult to awaken.  You are weak, dizzy, or extremely thirsty. SEEK IMMEDIATE MEDICAL CARE IF:   You are unable to keep fluids down or you get worse despite treatment.  You have frequent episodes of vomiting or diarrhea.  You have blood or green matter (bile) in your vomit.  You have blood in your stool or your stool looks black and tarry.  You have not urinated in 6 to 8 hours, or you have only urinated a small amount of very dark urine.  You have a fever.  You faint. MAKE SURE YOU:   Understand these instructions.  Will watch your condition.  Will get help right away if you are not doing well or get worse. Document Released: 03/22/2005 Document Revised: 06/14/2011 Document Reviewed: 11/09/2010 [employer] Patient Information 2015 [employer]. This information is not intended to replace advice given to you by your health care provider. Make sure you discuss any questions you have with your health care   provider.  

## 2014-06-24 NOTE — ED Notes (Signed)
Pt reports that she had a bad cold about 1 month ago. Pt seen by PMD and given z-pack. Pt then had the flu and given tamiflu. Pt completed tamiflu but does not feel any better. Pt c/o weakness and fever. Pt 85% on room air for EMS. Pt given neb treatment of albuterol and atrovent, 125 mg of solumedrol IV, 4 mg of zofran by EMS.

## 2014-06-24 NOTE — ED Notes (Addendum)
Assisted pt to the bathroom via wc. This RN was unaware pt needed urine specimen at this time.

## 2014-06-26 ENCOUNTER — Encounter: Payer: Self-pay | Admitting: Clinical

## 2014-06-26 ENCOUNTER — Ambulatory Visit (INDEPENDENT_AMBULATORY_CARE_PROVIDER_SITE_OTHER): Payer: Medicare Other | Admitting: Family Medicine

## 2014-06-26 ENCOUNTER — Encounter: Payer: Self-pay | Admitting: Family Medicine

## 2014-06-26 VITALS — BP 172/84 | HR 100 | Temp 98.3°F | Ht 59.0 in | Wt 114.0 lb

## 2014-06-26 DIAGNOSIS — I10 Essential (primary) hypertension: Secondary | ICD-10-CM | POA: Diagnosis not present

## 2014-06-26 DIAGNOSIS — J449 Chronic obstructive pulmonary disease, unspecified: Secondary | ICD-10-CM

## 2014-06-26 DIAGNOSIS — R079 Chest pain, unspecified: Secondary | ICD-10-CM

## 2014-06-26 MED ORDER — ALBUTEROL SULFATE HFA 108 (90 BASE) MCG/ACT IN AERS: 2.0000 | INHALATION_SPRAY | Freq: Four times a day (QID) | RESPIRATORY_TRACT | Status: DC | PRN

## 2014-06-26 MED ORDER — FLUTICASONE-SALMETEROL 230-21 MCG/ACT IN AERO: 2.0000 | INHALATION_SPRAY | Freq: Two times a day (BID) | RESPIRATORY_TRACT | Status: DC

## 2014-06-26 NOTE — Assessment & Plan Note (Addendum)
Wheezing but non labored on exam today  Start dulera, spiro with clinical pharmacist scheduled Unclear wether this is a long standing issue, may have been flu related while admitted and current state could be recovery from flu, however we should investigate a bit further Inhaler teaching today with clinical pharmacists, alos patient states cannot afford meds and was given money to cover cost of albuterol from indigent fund after cost confirmed with [facility].  Pt desaturated to 80% with ambulating today so clearly qualifies for home O2, however she refuses it and state sthat she had it at home and sent it back because she didn't want to wear it With wheezing and asymmetry on exam offered CXR, had one in ED 2 days ago, and she declines due to having 2 recently.

## 2014-06-26 NOTE — [facility] Notes (Signed)
The patient disclosed that she could not afford her albuterol. The patient was given a 1 time amount of $8 from the indigent fund.  Hunt Oris, MSW, [place]

## 2014-06-26 NOTE — Assessment & Plan Note (Signed)
Elevated today, off of her meds No red flags Restart meds, close PCP follow up Will likely need more than 5 mg lisinopril

## 2014-06-26 NOTE — [facility] Notes (Signed)
Patient ID: [name], female   DOB: April 04, 1946, 69 y.o.   MRN: [id number]   HPI  Patient presents today for hospital follow up  Pt states taht since leaving teh hospital she is breathing better but continues to have weakness.   Describes intermittent L sided sharp chest pain taht lasts 1-2 minutes, mostly happens at rest but feels it would get worse if she would exert herself. No indigestion but does seem to occur with stress. No radiation.  Also has exertional dyspnea, no orthopnea.  No fevers, chills, sweats, change in Appetite  Ankle swelling Intermittent swelling that iis symmetric. R leg pain from toes to r groin, has not tried any meds for this.  Smoking status noted - former smoker, approx 25 years of 1 pack per 2 weeks.  ROS: Per HPI  Objective: BP 172/84 mmHg  Pulse 100  Temp(Src) 98.3 F (36.8 C) (Oral)  Ht 4\' 11"  (1.499 m)  Wt 114 lb (51.71 kg)  BMI 23.01 kg/m2  SpO2 88%  LMP 01/09/2012 Gen: NAD, alert, cooperative with exam HEENT: NCAT CV: RRR, good S1/S2, no murmur Resp: non labored, exp wheezes more prominent on The R Ext: No edema, warm Neuro: Alert and oriented  Psych: appropriate mood and affect, tangential thought process.   On ambulatory O2 sats ranged from 80-88% on RA  Assessment and plan:  Chest pain Atypical chest pain with exertional dyspnea that is likely due to COPD  Treating COPD more aggresively and getting spiro Refer to cardiology given need for echo and likely need for stress   COPD (chronic obstructive pulmonary disease) Wheezing but non labored on exam today  Start dulera, spiro with clinical pharmacist scheduled Unclear wether this is a long standing issue, may have been flu related while admitted and current state could be recovery from flu, however we should investigate a bit further Inhaler teaching today with clinical pharmacists, alos patient states cannot afford meds and was given money to cover cost of albuterol from  [employer] after cost confirmed with [facility].  Pt desaturated to 80% with ambulating today so clearly qualifies for home O2, however she refuses it and state sthat she had it at home and sent it back because she didn't want to wear it With wheezing and asymmetry on exam offered CXR, had one in ED 2 days ago, and she declines due to having 2 recently.       Essential hypertension Elevated today, off of her meds No red flags Restart meds, close PCP follow up Will likely need more than 5 mg lisinopril    Orders Placed This Encounter  Procedures  . Ambulatory referral to Cardiology    Referral Priority:  Routine    Referral Type:  Consultation    Referral Reason:  Specialty Services Required    Requested Specialty:  Cardiology    Number of Visits Requested:  1    Meds ordered this encounter  Medications  . fluticasone-salmeterol (ADVAIR HFA) 230-21 MCG/ACT inhaler    Sig: Inhale 2 puffs into the lungs 2 (two) times daily.    Dispense:  1 Inhaler    Refill:  12  . albuterol (PROVENTIL HFA;VENTOLIN HFA) 108 (90 BASE) MCG/ACT inhaler    Sig: Inhale 2 puffs into the lungs every 6 (six) hours as needed for wheezing or shortness of breath.    Dispense:  1 Inhaler    Refill:  2

## 2014-06-26 NOTE — Assessment & Plan Note (Signed)
Atypical chest pain with exertional dyspnea that is likely due to COPD  Treating COPD more aggresively and getting spiro Refer to cardiology given need for echo and likely need for stress

## 2014-06-26 NOTE — Patient Instructions (Addendum)
Great to see you  Come back to see Dr Sherril Cong in 3-4 weeks Make an appointment with Dr. Valentina Lucks for [place] should hear from cardiology for an appointment, This is very important  Start advair 2 puffs 2 times every day- This is an everyday inhaler Albuterol is a rescue inhaler for when you are having a hard time breathing,  2 puffs every 4 hours for cough shortness of breath or wheezing  Chest Pain (Nonspecific) It is often hard to give a specific diagnosis for the cause of chest pain. There is always a chance that your pain could be related to something serious, such as a heart attack or a blood clot in the lungs. You need to follow up with your health care provider for further evaluation. CAUSES   Heartburn.  Pneumonia or bronchitis.  Anxiety or stress.  Inflammation around your heart (pericarditis) or lung (pleuritis or pleurisy).  A blood clot in the lung.  A collapsed lung (pneumothorax). It can develop suddenly on its own (spontaneous pneumothorax) or from trauma to the chest.  Shingles infection (herpes zoster virus). The chest wall is composed of bones, muscles, and cartilage. Any of these can be the source of the pain.  The bones can be bruised by injury.  The muscles or cartilage can be strained by coughing or overwork.  The cartilage can be affected by inflammation and become sore (costochondritis). DIAGNOSIS  Lab tests or other studies may be needed to find the cause of your pain. Your health care provider may have you take a test called an ambulatory electrocardiogram (ECG). An ECG records your heartbeat patterns over a 24-hour period. You may also have other tests, such as:  Transthoracic echocardiogram (TTE). During echocardiography, sound waves are used to evaluate how blood flows through your heart.  Transesophageal echocardiogram (TEE).  Cardiac monitoring. This allows your health care provider to monitor your heart rate and rhythm in real  time.  Holter monitor. This is a portable device that records your heartbeat and can help diagnose heart arrhythmias. It allows your health care provider to track your heart activity for several days, if needed.  Stress tests by exercise or by giving medicine that makes the heart beat faster. TREATMENT   Treatment depends on what may be causing your chest pain. Treatment may include:  Acid blockers for heartburn.  Anti-inflammatory medicine.  Pain medicine for inflammatory conditions.  Antibiotics if an infection is present.  You may be advised to change lifestyle habits. This includes stopping smoking and avoiding alcohol, caffeine, and chocolate.  You may be advised to keep your head raised (elevated) when sleeping. This reduces the chance of acid going backward from your stomach into your esophagus. Most of the time, nonspecific chest pain will improve within 2-3 days with rest and mild pain medicine.  HOME CARE INSTRUCTIONS   If antibiotics were prescribed, take them as directed. Finish them even if you start to feel better.  For the next few days, avoid physical activities that bring on chest pain. Continue physical activities as directed.  Do not use any tobacco products, including cigarettes, chewing tobacco, or electronic cigarettes.  Avoid drinking alcohol.  Only take medicine as directed by your health care provider.  Follow your health care provider's suggestions for further testing if your chest pain does not go away.  Keep any follow-up appointments you made. If you do not go to an appointment, you could develop lasting (chronic) problems with pain. If there is any  problem keeping an appointment, call to reschedule. SEEK MEDICAL CARE IF:   Your chest pain does not go away, even after treatment.  You have a rash with blisters on your chest.  You have a fever. SEEK IMMEDIATE MEDICAL CARE IF:   You have increased chest pain or pain that spreads to your arm,  neck, jaw, back, or abdomen.  You have shortness of breath.  You have an increasing cough, or you cough up blood.  You have severe back or abdominal pain.  You feel nauseous or vomit.  You have severe weakness.  You faint.  You have chills. This is an emergency. Do not wait to see if the pain will go away. Get medical help at once. Call your local emergency services (911 in [place] Do not drive yourself to the hospital. MAKE SURE YOU:   Understand these instructions.  Will watch your condition.  Will get help right away if you are not doing well or get worse. Document Released: 12/30/2004 Document Revised: 03/27/2013 Document Reviewed: 10/26/2007 [facility] Patient Information 2015 [place], [place]. This information is not intended to replace advice given to you by your health care provider. Make sure you discuss any questions you have with your health care provider.

## 2014-06-27 ENCOUNTER — Encounter: Payer: Self-pay | Admitting: Pharmacist

## 2014-06-27 ENCOUNTER — Ambulatory Visit (INDEPENDENT_AMBULATORY_CARE_PROVIDER_SITE_OTHER): Payer: Medicare Other | Admitting: Pharmacist

## 2014-06-27 VITALS — BP 109/77 | HR 92 | Ht <= 58 in | Wt 116.0 lb

## 2014-06-27 DIAGNOSIS — J441 Chronic obstructive pulmonary disease with (acute) exacerbation: Secondary | ICD-10-CM

## 2014-06-27 NOTE — Patient Instructions (Addendum)
It was great to see you today!  Take your albuterol and Advair inhalers. Use the spacer to help you use them.   Take two prednisone pills every day. Come back to see [place] on Monday at 1:30 PM.   Go to the emergency room or call 911 if you have difficulty breathing and the albuterol does not help.

## 2014-06-27 NOTE — Progress Notes (Signed)
S:    Patient arrives in good spirits walking without assistance. Presents for lung function evaluation. Patient reports breathing has been labored and reports a persistent cough. Patient seems very distracted and has a hard time staying with one train of thought. However, patient was able to recite a 60-second religious poem from memory without pause.   Patient reports last dose of COPD medications was albuterol around an hour before her appointment today. She states that she now has both her Advair inhaler and her albuterol rescue inhaler (she "stole" her sister's). She also voices that she does not want to use oxygen and she cannot seem to understand why she is having breathing problems. She reports that she return the oxygen that the home health agency delivered to her because it took up too much room in her apartment and that she also doesn't want to carry it around.   She endorses a smoking history, but states that she only smoked about 1 and one-half cigarettes per day for about 40 years. However, she states that its as if she was a non-smoker because of how few she smoked. She states that the most she ever smoked was 7 cigarettes in a day about 1 year ago. The patient states that she is not quite ready to smoke, but she is "getting there."  She states she has taken prednisone 2x before but she doesn't think she finished the whole course.   Sats are in the high 80s and low 90s when sitting. After walking a lap around the inside of the building, her sats were in the low to mid 90s. However, patient had to stop frequently due to SOB and leg pain.   O: CAT score= 23 See Documentation Flowsheet - CAT/COPD for complete symptom scoring.  See "scanned report" or Documentation Flowsheet (discrete results - PFTs) for  Spirometry results. Patient provided good effort while attempting spirometry.   Lung Age = 89 years Albuterol Neb  Lot# [id number]     Exp. August 2017 Ipratropium Neb  [id number]    Exp.  August 2017  A/P: COPD: Spirometry evaluation reveals very severe obstructive lung disease with a CAT score of 23.  Patient had her Advair and albuterol inhalers with her. Further educated patient on proper inhaler use and had patient repeat back appropriate inhaler technique. Patient verbalized appropriate technique and stated she understood how to use the inhaler. However, when the patient attempted to use her inhaler, she could not time the inhaler breath properly and was never able to master the inhaler technique on her own. Patient received 4 puffs of albuterol, but likely that minimal albuterol reached the lungs. Patient states that she feels most of the albuterol absorb onto her tongue.  Consulted with Dr. Mingo Amber due to low oxygen sats with ambulation. He performed brief physical exam and recommended further oxygen saturation evaluation upon ambulation. After walking around the building her oxygen sats ranged from 84 to 89. Patient also had to stop multiple times due to SOB.  Due to (likely) minimal lung absorption of albuterol, had the patient take albuterol and ipratropium nebulizer solution. Spirometry repeated after nebulizer solution was given. Results revealed "Significant Improvement in FVC and FEV1." Patient was again offered oxygen to take home, but refused it given that she continues to smoke daily.  Educated patient on purpose, proper use, potential adverse effects of prednisone and Advair inhalter. Patient provided with spacer and educated on technique with spacer. Also provided patient with prednisone 20 mg x 10  tablets (LOT #[id number], expiration date June 2017) to be taken 2 daily until gone. Reviewed results of pulmonary function tests.  Pt verbalized understanding of results and education.  Written pt instructions provided.  F/U Clinic visit on March 28th at 1:30 PM.   Total time in face to face counseling 90 minutes.  Patient seen with Richarda Osmond, PharmD Candidate, Elicia Lamp,  PharmD  Resident, and Nicoletta Ba, PharmD resident.

## 2014-06-27 NOTE — Assessment & Plan Note (Addendum)
COPD: Spirometry evaluation reveals very severe obstructive lung disease with a CAT score of 23.  Patient had her Advair and albuterol inhalers with her. Further educated patient on proper inhaler use and had patient repeat back appropriate inhaler technique. Patient verbalized appropriate technique and stated she understood how to use the inhaler. However, when the patient attempted to use her inhaler, she could not time the inhaler breath properly and was never able to master the inhaler technique on her own. Patient received 4 puffs of albuterol, but likely that minimal albuterol reached the lungs. Patient states that she feels most of the albuterol absorb onto her tongue.  Consulted with Dr. Mingo Amber due to low oxygen sats with ambulation. He performed brief physical exam and recommended further oxygen saturation evaluation upon ambulation. After walking around the building her oxygen sats ranged from 84 to 89. Patient also had to stop multiple times due to SOB.  Due to (likely) minimal lung absorption of albuterol, had the patient take albuterol and ipratropium nebulizer solution. Spirometry repeated after nebulizer solution was given. Results revealed "Significant Improvement in FVC and FEV1." Patient was again offered oxygen to take home, but refused it given that she continues to smoke daily.  Educated patient on purpose, proper use, potential adverse effects of prednisone and Advair inhalter. Patient provided with spacer and educated on technique with spacer. Also provided patient with prednisone 20 mg x 10 tablets (LOT #[id number], expiration date June 2017) to be taken 2 daily until gone. Reviewed results of pulmonary function tests.  Pt verbalized understanding of results and education.  Written pt instructions provided.  F/U Clinic visit on March 28th at 1:30 PM.   Total time in face to face counseling 90 minutes.  Patient seen with Richarda Osmond, PharmD Candidate, Elicia Lamp,  PharmD Resident, and Nicoletta Ba, PharmD resident

## 2014-06-28 ENCOUNTER — Other Ambulatory Visit: Payer: Self-pay | Admitting: Family Medicine

## 2014-07-01 ENCOUNTER — Ambulatory Visit (INDEPENDENT_AMBULATORY_CARE_PROVIDER_SITE_OTHER): Payer: Medicare Other | Admitting: Family Medicine

## 2014-07-01 ENCOUNTER — Encounter: Payer: Self-pay | Admitting: Family Medicine

## 2014-07-01 VITALS — BP 159/96 | HR 95 | Temp 98.3°F | Ht <= 58 in | Wt 116.7 lb

## 2014-07-01 DIAGNOSIS — J441 Chronic obstructive pulmonary disease with (acute) exacerbation: Secondary | ICD-10-CM | POA: Diagnosis not present

## 2014-07-01 NOTE — Telephone Encounter (Signed)
Patient needs to be seen for TSH before next refill

## 2014-07-01 NOTE — Patient Instructions (Signed)
Please continue taking the steroid burst until completion. Take her Advair twice a day, 2 puffs.  Usually her albuterol inhaler for emergencies only, when you do have to use it used to puffs wait 15 minutes. If you're unable to take a deep breath, or feel short of breath take another 2 puffs and again wait 15 minutes. Follow-up with your PCP as soon as possible for all your other chronic medical issues and medication questions

## 2014-07-01 NOTE — Assessment & Plan Note (Addendum)
Patient appears improved with addition of Advair and steroid burst. Advised her to continue the steroids, continue the Advair 2 puffs 2 times a day. He is of albuterol inhaler for emergency purposes only. At that time if needed she should only use 2 puffs and wait 15 minutes. Patient's four-minute walk today was much improved from prior, lowest saturation was 90% O2. She remained mostly stable with walking at 92%. She did have times where she stopped to talk. Patient is to follow-up with her PCP as soon as possible as she has many chronic issues that she needs followed up upon and medication questions. Patient states that she with scheduled appointment on her way out today.

## 2014-07-01 NOTE — Progress Notes (Signed)
Patient ID: [name], female   DOB: 10-11-1945, 69 y.o.   MRN: [id number] Reviewed: Agree with the documentation and management by Dr. Valentina Lucks.

## 2014-07-01 NOTE — Progress Notes (Signed)
   Subjective:    Patient ID: [name], female    DOB: May 02, 1945, 69 y.o.   MRN: [id number]  HPI  COPD: Patient presents to same-day clinic for follow-up to start Advair inhaler 4 days ago. At that time patient's oxygen saturations remained in the mid 80s with ambulation, and she refused home oxygen. She continues to smoke daily, but states she has cut down to one cigarette a day and she is trying to quit. Patient has been taken her Advair 2-3 puffs a day, 2 times a day as directed. Admits to using her albuterol inhaler 3 times over the weekend. She states that she is feeling better since starting the new medication, and she doesn't feel as short of breath with walking.  Current every day smoker  Past Medical History  Diagnosis Date  . OCD (obsessive compulsive disorder)   . Depression   . Anxiety   . PUD (peptic ulcer disease) 09/2006    H pylori  . Diverticulosis of colon   . GERD (gastroesophageal reflux disease)   . COPD (chronic obstructive pulmonary disease)     Due to long history of tobacco abuse, still smoking  . History of uterine prolapse 10/2004    Dr Cletis Media  . Thyroid disease     Ho R thyroid noduel plus mild hyperthyroidism. Treated with radioiodine therapty on 04/2006. Dr Estrella Myrtle.  [name] Heart murmur   . Osteoporosis    Allergies  Allergen Reactions  . Seroquel [Quetiapine Fumarate] Other (See Comments)    Made patient drowsy   . Amoxicillin Nausea Only  . Ciprofloxacin Other (See Comments)    Pt doesn't remember reaction  . Haloperidol Lactate Other (See Comments)    Made arms stiff  . Ketorolac Tromethamine Other (See Comments)    Pt doesn't remember reaction  . Anette Guarneri [Lurasidone Hcl] Other (See Comments)    Patient says he made her feel weird   . Thorazine [Chlorpromazine] Other (See Comments)    Pt does not remember reaction.   . Wellbutrin [Bupropion] Other (See Comments)    Makes her feel "sick, strange".     Review of Systems  per history of  present illness     Objective:   Physical Exam BP 159/96 mmHg  Pulse 95  Temp(Src) 98.3 F (36.8 C) (Oral)  Ht 4' 6.5" (1.384 m)  Wt 116 lb 11.2 oz (52.935 kg)  BMI 27.64 kg/m2  SpO2 95%  LMP 01/09/2012 Gen: NAD. Nontoxic appearance, anxious.  HEENT: AT. [place]. Bilateral eyes without injections or icterus. MMM.  CV: RRR  Chest: CTAB, no wheeze or crackles  four-minute walk, lowest O2 saturation of 90%. Mostly stable with 92% O2 saturations       Assessment & Plan:

## 2014-07-04 DIAGNOSIS — N644 Mastodynia: Secondary | ICD-10-CM | POA: Diagnosis not present

## 2014-07-04 DIAGNOSIS — R32 Unspecified urinary incontinence: Secondary | ICD-10-CM | POA: Diagnosis not present

## 2014-07-04 DIAGNOSIS — Z01419 Encounter for gynecological examination (general) (routine) without abnormal findings: Secondary | ICD-10-CM | POA: Diagnosis not present

## 2014-07-08 DIAGNOSIS — L84 Corns and callosities: Secondary | ICD-10-CM | POA: Diagnosis not present

## 2014-07-08 DIAGNOSIS — Q6689 Other  specified congenital deformities of feet: Secondary | ICD-10-CM | POA: Diagnosis not present

## 2014-07-10 ENCOUNTER — Ambulatory Visit (INDEPENDENT_AMBULATORY_CARE_PROVIDER_SITE_OTHER): Payer: Medicare Other | Admitting: Family Medicine

## 2014-07-10 ENCOUNTER — Encounter: Payer: Self-pay | Admitting: Family Medicine

## 2014-07-10 ENCOUNTER — Other Ambulatory Visit: Payer: Self-pay | Admitting: Obstetrics & Gynecology

## 2014-07-10 VITALS — BP 138/98 | HR 90 | Temp 97.9°F | Ht <= 58 in | Wt 114.0 lb

## 2014-07-10 DIAGNOSIS — J441 Chronic obstructive pulmonary disease with (acute) exacerbation: Secondary | ICD-10-CM

## 2014-07-10 DIAGNOSIS — N644 Mastodynia: Secondary | ICD-10-CM

## 2014-07-10 MED ORDER — PREDNISONE 20 MG PO TABS: 40.0000 mg | ORAL_TABLET | Freq: Every day | ORAL | Status: DC

## 2014-07-10 MED ORDER — AZITHROMYCIN 250 MG PO TABS: ORAL_TABLET | ORAL | Status: DC

## 2014-07-10 NOTE — Progress Notes (Signed)
   Subjective:    Patient ID: [name], female    DOB: 01/30/1946, 69 y.o.   MRN: [id number]  Seen for Same day visit for   CC: Cold  She reports productive cough and myalgias for the last 2 weeks.  Reports that she has not felt well since her hospitalization for influenza.  She has been using MDIs but admits to not using spacer due to not knowing how to use it, however, she had visit 4 days ago with Dr. Valentina Lucks for COPD medication inhaler instruction She reports using her Advair as prescribed but does not have it with her today.  She denies any current fevers or chills.  Denies any current chest pain.  Portion not been on antibiotics since prior to hospitalization.  She was previously advised to use home oxygen but has declined this several times.  She continues to smoke.   Review of Systems   See HPI for ROS. Objective:  BP 138/98 mmHg  Pulse 90  Temp(Src) 97.9 F (36.6 C) (Oral)  Ht 4' 6.5" (1.384 m)  Wt 114 lb (51.71 kg)  BMI 27.00 kg/m2  LMP 01/09/2012  General: NAD Cardiac: RRR, normal heart sounds, no murmurs. 2+ radial and PT pulses bilaterally Respiratory: Diffusely decreased breath sounds. Mild expiratory wheeze; normal effort.  Oxygen saturation 95% on room air while sitting Abdomen: soft, nontender, nondistended, Bowel sounds present Extremities: no edema or cyanosis. WWP. Skin: warm and dry, no rashes noted   Assessment & Plan:  See Problem List Documentation

## 2014-07-10 NOTE — Patient Instructions (Addendum)
It was great seeing you today.   1. Take Zpack for next 5 days; 2 pills on day 1 followed by 1 pill daily for the next 4 days 2. Take prednisone 40 mg 2 pills daily  5 days 3. Continue using Advair twice daily.  Remember to wash her mouth out after use 4. Use spacer with albuterol every time   Please bring all your medications to every doctors visit  Next Appointment  Please make an appointment with Dr Valentina Lucks in 1-2 weeks to discuss COPD medications. Bring all your medications and inhaler with spacer to this visit.     If you have any questions or concerns before then, please call the clinic at [phone].  Take Care,   Dr Phill Myron

## 2014-07-10 NOTE — Assessment & Plan Note (Signed)
Likely COPD exacerbation giving worsening cough, worsening sputum production.  Possibly, complicated by incomplete recovery from recent influenza and inappropriate use of MDIs without spacer.  - Patient again adamantly refuses home oxygen; continues to smoke - Prednisone 405 days and Z-Pak for COPD exacerbation - Follow-up in pharmacy clinic for COPD medication review

## 2014-07-15 ENCOUNTER — Encounter: Payer: Self-pay | Admitting: Family Medicine

## 2014-07-15 ENCOUNTER — Ambulatory Visit
Admission: RE | Admit: 2014-07-15 | Discharge: 2014-07-15 | Disposition: A | Discharge status: Home / Self Care | Payer: Medicare Other | Source: Ambulatory Visit | Attending: Obstetrics & Gynecology | Admitting: Obstetrics & Gynecology

## 2014-07-15 DIAGNOSIS — N63 Unspecified lump in breast: Secondary | ICD-10-CM | POA: Diagnosis not present

## 2014-07-15 DIAGNOSIS — N644 Mastodynia: Secondary | ICD-10-CM

## 2014-07-15 DIAGNOSIS — N6459 Other signs and symptoms in breast: Secondary | ICD-10-CM | POA: Diagnosis not present

## 2014-07-15 NOTE — Progress Notes (Unsigned)
Patient is wanting an appt with social worker to help her with filling out the forms.  Please call her.

## 2014-07-16 ENCOUNTER — Ambulatory Visit (INDEPENDENT_AMBULATORY_CARE_PROVIDER_SITE_OTHER): Payer: Medicare Other | Admitting: Family Medicine

## 2014-07-16 ENCOUNTER — Encounter: Payer: Self-pay | Admitting: Family Medicine

## 2014-07-16 VITALS — BP 149/88 | HR 112 | Temp 98.0°F | Ht <= 58 in | Wt 116.0 lb

## 2014-07-16 DIAGNOSIS — M79671 Pain in right foot: Secondary | ICD-10-CM

## 2014-07-16 NOTE — Progress Notes (Signed)
LVM for pt to schedule her for an appt with me to assist with [facility] paperwork.  Hunt Oris, MSW, [place]

## 2014-07-16 NOTE — Patient Instructions (Signed)
Take naproxen 1-2 tablets twice daily with food for 5 days. Be sure to wear comfortable shoes that have a large toe box and use a pad at the side of your foot that is rubbing against your shoe. Follow up with podiatry. Seek immediate care if you have fevers, severe swelling or worsening pain. Follow up with Dr Valentina Lucks 4/21 for your breathing or sooner with any provider available here if you have worsened breathing.   Best,  Hilton Sinclair, MD

## 2014-07-16 NOTE — Assessment & Plan Note (Signed)
Recent right fifth digit procedure one week ago with podiatry, this is the current location of pain. Erythema suggests that she is still getting pressure on lateral surface of the fifth digit from shoes.  -Discussed protecting this area with larger toe box and placing some padding in the shoe. -Naproxen 220-440 milligrams twice a day with food for 5 days. Hold if any gastric irritation or bleeding. -Follow-up with podiatry. -Discussed reasons for immediate return for evaluation.

## 2014-07-16 NOTE — Progress Notes (Signed)
Patient ID: [name], female   DOB: Feb 16, 1946, 69 y.o.   MRN: [id number] Subjective:   CC: Right foot pain  HPI:   Patient presents with right foot pain on lateral fifth digit with some erythema for about 9 months. About one week ago had a bunionectomy in this location with minimal improvement per patient. She is not taking any medication for pain at this time. She denies fevers, chills, warmth, purulence. However, pain can get excruciating. She denies any recent trauma or injury. Mild improvement in pain wearing different shoes today.  Review of Systems - Per HPI. Additionally, she states that a recent cold is not improved. No chest pain or dyspnea.  PMH: COPD, allergic rhinitis, right kidney angiomyolipoma, anxiety disorder, unspecified bipolar disorder, high fall risk, history of CVA, hypertension, GERD, hyperlipidemia, hypothyroidism, OCD, osteoporosis, pulmonary nodule, tobacco abuse, stress incontinence Smoking status: Current every day smoker    Objective:  Physical Exam BP 149/88 mmHg  Pulse 112  Temp(Src) 98 F (36.7 C) (Oral)  Ht 4' 6.5" (1.384 m)  Wt 116 lb (52.617 kg)  BMI 27.47 kg/m2  SpO2 95%  LMP 01/09/2012 GEN: NAD Pulmonary: Normal effort Extremities: Right lateral fifth digit with mild erythema, no warmth, no induration, no swelling, healed linear scar, with moderate tenderness to palpation worst at inferior MTP joint; full ROM with 5/5 strength to dorsi- and plantar flexion of 5th digit; 1+ DP pulse    Assessment:     [name] is a 69 y.o. female here for right foot pain.    Plan:     # See problem list and after visit summary for problem-specific plans. -Was unable to fully discuss today, but with reported recent cold and lack of improvement, no dyspnea, normal pulmonary effort and normal O2 saturation, asked patient to follow-up as scheduled with Dr. Valentina Lucks 4/21or sooner if symptoms worsen.  # Health Maintenance: Not discussed  Follow-up: Follow  up in 1-2 weeks if no improvement in pain.   Hilton Sinclair, MD [place]

## 2014-07-17 ENCOUNTER — Telehealth: Payer: Self-pay | Admitting: Clinical

## 2014-07-17 NOTE — Telephone Encounter (Signed)
Pt called to schedule an appt with CSW to complete HCPOA paperwork. Pt has been scheduled to see CSW on 07/23/14 at [place], [place], [place]

## 2014-07-22 ENCOUNTER — Ambulatory Visit (HOSPITAL_COMMUNITY)
Admission: RE | Admit: 2014-07-22 | Discharge: 2014-07-22 | Disposition: A | Discharge status: Home / Self Care | Payer: Medicare Other | Source: Ambulatory Visit | Attending: Family Medicine | Admitting: Family Medicine

## 2014-07-22 ENCOUNTER — Ambulatory Visit (INDEPENDENT_AMBULATORY_CARE_PROVIDER_SITE_OTHER): Payer: Medicare Other | Admitting: Family Medicine

## 2014-07-22 ENCOUNTER — Encounter: Payer: Self-pay | Admitting: Family Medicine

## 2014-07-22 VITALS — BP 118/62 | HR 108 | Temp 98.3°F | Ht <= 58 in | Wt 114.0 lb

## 2014-07-22 DIAGNOSIS — R079 Chest pain, unspecified: Secondary | ICD-10-CM

## 2014-07-22 DIAGNOSIS — R600 Localized edema: Secondary | ICD-10-CM | POA: Diagnosis not present

## 2014-07-22 DIAGNOSIS — M79671 Pain in right foot: Secondary | ICD-10-CM

## 2014-07-22 MED ORDER — GABAPENTIN 100 MG PO CAPS: 100.0000 mg | ORAL_CAPSULE | Freq: Three times a day (TID) | ORAL | Status: DC

## 2014-07-22 NOTE — Assessment & Plan Note (Addendum)
BL foot pain, R>L after bunionectomy by podiatry L foot sounds like neuropathic pain she feels is related to morton's neuroma.  Trial of gabapentin, 100 mg up to TID Encouraged tylenol as well

## 2014-07-22 NOTE — Progress Notes (Signed)
VASCULAR LAB PRELIMINARY  PRELIMINARY  PRELIMINARY  PRELIMINARY  Left lower extremity venous duplex  completed.    Preliminary report:  Left:  No evidence of DVT, superficial thrombosis, or Baker's cyst.  Juliet Vasbinder, RVT 07/22/2014, 4:39 PM

## 2014-07-22 NOTE — Assessment & Plan Note (Addendum)
Acute onset over the last 4-5 days Venous duplex to r/o DVT Also discussed cutting back on salt intake, patient really likes fritos Close f/u 4/28  Received prelim result for venous duplex- negative

## 2014-07-22 NOTE — Assessment & Plan Note (Signed)
Mixed atypical and typical features but mostly atypical Very possibly mood related, but given age cardiac etiology should be strongly considered No current chest pain Has cardiology appt set up Daily asa, statin, acei

## 2014-07-22 NOTE — Progress Notes (Addendum)
Patient ID: [name], female   DOB: 07-30-1945, 69 y.o.   MRN: [id number]   HPI  Patient presents today for foot pain and leg swelling  Leg swelling   Started 4-5 days ago without cause NO trauma Mild calf tenderness No erythema No Hx of clot, no recent Hx of surgery or prolonged immobilzatuion duyspnea improved from previous, no chest pain currently  Foot pain R foot pain after a bunionectomy, having a hard time finding shoes that dont put pressure on it Takes aspirin which only helps some Doesn't like tylenol No bleeding, drainage, redness  L foot pain on toes 1-3, described as tingly numbness that has been there for months, worsened with recent swelling.   Chest pain Describes central chest discomfort which lasts for a few minutes at a time. It is inconsistently associated with exertion and does happen at rest as often as it happens with activity. Nonradiating Occasionally associated with nausea Takes aspirin when she has a states that that helps a lot.  PMH: Smoking status noted -  ROS: Per HPI  Objective: BP 118/62 mmHg  Pulse 108  Temp(Src) 98.3 F (36.8 C) (Oral)  Ht 4' 6.5" (1.384 m)  Wt 114 lb (51.71 kg)  BMI 27.00 kg/m2  LMP 01/09/2012 Gen: NAD, alert, cooperative with exam HEENT: NCAT CV: RRR, good S1/S2, no murmur Resp: Good air movement, nonlabored, some scattered expiratory wheezes Ext: Left lower extremity with 1-2+ pitting edema Circumference is 33.5 cm, right lower extremity with no edema and Circumference is 32 cm, mild tenderness to palpation of left lower extremity and left calf, no palpable cords no Homans sign no erythema Neuro: Alert and oriented Psych: Tangential thought process  Assessment and plan:  Edema of left lower extremity Acute onset over the last 4-5 days Venous duplex to r/o DVT Also discussed cutting back on salt intake, patient really likes fritos Close f/u 4/28  Received prelim result for venous duplex-  negative   Foot pain BL foot pain, R>L after bunionectomy by podiatry L foot sounds like neuropathic pain she feels is related to morton's neuroma.  Trial of gabapentin, 100 mg up to TID Encouraged tylenol as well   Chest pain Mixed atypical and typical features but mostly atypical Very possibly mood related, but given age cardiac etiology should be strongly considered No current chest pain Has cardiology appt set up Daily asa, statin, acei      Orders Placed This Encounter  Procedures  . Lower Extremity Venous Duplex Left    Standing Status: Future     Number of Occurrences:      Standing Expiration Date: 07/22/2015    Order Specific Question:  Laterality    Answer:  Left    Order Specific Question:  Where should this test be performed:    Answer:  [facility]    Meds ordered this encounter  Medications  . gabapentin (NEURONTIN) 100 MG capsule    Sig: Take 1 capsule (100 mg total) by mouth 3 (three) times daily.    Dispense:  90 capsule    Refill:  0

## 2014-07-22 NOTE — Patient Instructions (Signed)
Great to see you!  Lets rule out a clot in your leg, if it is positive they will call us right away and we will get you seen appropriately  For your pain try gabapentin, 1 pill at bedtime, if it does not make you drowsy you can take it up to 3 times a day.   Chest Pain (Nonspecific) It is often hard to give a specific diagnosis for the cause of chest pain. There is always a chance that your pain could be related to something serious, such as a heart attack or a blood clot in the lungs. You need to follow up with your health care provider for further evaluation. CAUSES   Heartburn.  Pneumonia or bronchitis.  Anxiety or stress.  Inflammation around your heart (pericarditis) or lung (pleuritis or pleurisy).  A blood clot in the lung.  A collapsed lung (pneumothorax). It can develop suddenly on its own (spontaneous pneumothorax) or from trauma to the chest.  Shingles infection (herpes zoster virus). The chest wall is composed of bones, muscles, and cartilage. Any of these can be the source of the pain.  The bones can be bruised by injury.  The muscles or cartilage can be strained by coughing or overwork.  The cartilage can be affected by inflammation and become sore (costochondritis). DIAGNOSIS  Lab tests or other studies may be needed to find the cause of your pain. Your health care provider may have you take a test called an ambulatory electrocardiogram (ECG). An ECG records your heartbeat patterns over a 24-hour period. You may also have other tests, such as:  Transthoracic echocardiogram (TTE). During echocardiography, sound waves are used to evaluate how blood flows through your heart.  Transesophageal echocardiogram (TEE).  Cardiac monitoring. This allows your health care provider to monitor your heart rate and rhythm in real time.  Holter monitor. This is a portable device that records your heartbeat and can help diagnose heart arrhythmias. It allows your health care  provider to track your heart activity for several days, if needed.  Stress tests by exercise or by giving medicine that makes the heart beat faster. TREATMENT   Treatment depends on what may be causing your chest pain. Treatment may include:  Acid blockers for heartburn.  Anti-inflammatory medicine.  Pain medicine for inflammatory conditions.  Antibiotics if an infection is present.  You may be advised to change lifestyle habits. This includes stopping smoking and avoiding alcohol, caffeine, and chocolate.  You may be advised to keep your head raised (elevated) when sleeping. This reduces the chance of acid going backward from your stomach into your esophagus. Most of the time, nonspecific chest pain will improve within 2-3 days with rest and mild pain medicine.  HOME CARE INSTRUCTIONS   If antibiotics were prescribed, take them as directed. Finish them even if you start to feel better.  For the next few days, avoid physical activities that bring on chest pain. Continue physical activities as directed.  Do not use any tobacco products, including cigarettes, chewing tobacco, or electronic cigarettes.  Avoid drinking alcohol.  Only take medicine as directed by your health care provider.  Follow your health care provider's suggestions for further testing if your chest pain does not go away.  Keep any follow-up appointments you made. If you do not go to an appointment, you could develop lasting (chronic) problems with pain. If there is any problem keeping an appointment, call to reschedule. SEEK MEDICAL CARE IF:   Your chest pain does not  go away, even after treatment.  You have a rash with blisters on your chest.  You have a fever. SEEK IMMEDIATE MEDICAL CARE IF:   You have increased chest pain or pain that spreads to your arm, neck, jaw, back, or abdomen.  You have shortness of breath.  You have an increasing cough, or you cough up blood.  You have severe back or  abdominal pain.  You feel nauseous or vomit.  You have severe weakness.  You faint.  You have chills. This is an emergency. Do not wait to see if the pain will go away. Get medical help at once. Call your local emergency services (911 in [place] Do not drive yourself to the hospital. MAKE SURE YOU:   Understand these instructions.  Will watch your condition.  Will get help right away if you are not doing well or get worse. Document Released: 12/30/2004 Document Revised: 03/27/2013 Document Reviewed: 10/26/2007 [facility] Patient Information 2015 [place], [place]. This information is not intended to replace advice given to you by your health care provider. Make sure you discuss any questions you have with your health care provider.

## 2014-07-23 ENCOUNTER — Telehealth: Payer: Self-pay | Admitting: Clinical

## 2014-07-23 NOTE — Telephone Encounter (Signed)
Pt no-showed for her scheduled appointment with CSW. CSW LVM for pt. Hunt Oris, MSW, [place]

## 2014-07-25 ENCOUNTER — Encounter (HOSPITAL_COMMUNITY): Payer: Medicare Other

## 2014-07-25 ENCOUNTER — Encounter: Payer: Self-pay | Admitting: Pharmacist

## 2014-07-25 ENCOUNTER — Ambulatory Visit (INDEPENDENT_AMBULATORY_CARE_PROVIDER_SITE_OTHER): Payer: Medicare Other | Admitting: Pharmacist

## 2014-07-25 VITALS — BP 120/74 | HR 89 | Ht <= 58 in | Wt 115.5 lb

## 2014-07-25 DIAGNOSIS — R600 Localized edema: Secondary | ICD-10-CM | POA: Insufficient documentation

## 2014-07-25 DIAGNOSIS — J449 Chronic obstructive pulmonary disease, unspecified: Secondary | ICD-10-CM

## 2014-07-25 NOTE — Assessment & Plan Note (Signed)
Left lower extremity swelling recently evaluated (4/18) found no evidence of clot.   No erythema.   Continue to monitor.  Reassess at next visit with Dr. Sherril Cong.

## 2014-07-25 NOTE — Progress Notes (Signed)
Patient ID: [name], female   DOB: July 27, 1945, 69 y.o.   MRN: [id number]  Reviewed: Agree with Dr. Graylin Shiver documentation and management.

## 2014-07-25 NOTE — Patient Instructions (Addendum)
It was great to see you today!  Start taking 2 puffs of Advair twice a day. Remember to separate the 2 puffs. Remember to rinse your mouth and spit.  Follow up with Dr. Sherril Cong on April 27th.

## 2014-07-25 NOTE — Progress Notes (Signed)
S: Patient arrives in good spirits walking without assistance. Presents for lung function evaluation.    Patient reports breathing is better but still reports issues of persistent cough.  Pt reports completing antibiotics and steroid dose pack (azithromycin and prednisone) that made her feel much better.   Patient reports compliance with current COPD meds: albuterol 2 puffs q6 hours prn sob & Advair HFA 230/79mcg 3 puffs BID.   Inhalation technique with spacer continues to be poor.  It was emphasized to breath in as deeply as possible and to hold breath.    Patient continues to not use oxygen at home and still continues to smoke a few cigarettes per week.   O: BP: 120/74  HR: 89   Physical Exam: per Dr. Minda Ditto EXT:  LLE- 2+ pitting edema to the mid - shin noted. No redness, or skin break down at this time. No calf tenderness. Metatarsal tenderness particularly with 4-5 metatarsals /  positive Mulder's sign on exam.  RLE: Less lower extremity edema trace - 1+. No tenderness.  No calf tenderness   P: Patient breathing has improved with current medications including albuterol and advair HFA.  Plan to decrease Advair HFA to 2 puffs BID with spacer/mask device.  Educated pt about proper inhalation technique with spacer and to rinse/spit after.  Left lower extremity swelling recently evaluated (4/18) found no evidence of clot.   No erythema.   Evaluated and discussed with Dr. Andria Frames.   Continue to monitor.  Reassess at next visit with Dr. Sherril Cong.    Patient seen with Eunice Blase, PharmD Candidate and Dr. Paula Compton, MD resident.

## 2014-07-25 NOTE — Assessment & Plan Note (Signed)
Patient breathing has improved with current medications including albuterol and advair HFA.  Plan to decrease Advair HFA to 2 puffs BID with spacer/mask device.  Educated pt about proper inhalation technique with spacer and to rinse/spit after.

## 2014-07-26 ENCOUNTER — Ambulatory Visit: Payer: Self-pay | Admitting: Family Medicine

## 2014-07-31 ENCOUNTER — Ambulatory Visit (INDEPENDENT_AMBULATORY_CARE_PROVIDER_SITE_OTHER): Payer: Medicare Other | Admitting: Family Medicine

## 2014-07-31 ENCOUNTER — Encounter: Payer: Self-pay | Admitting: Family Medicine

## 2014-07-31 VITALS — BP 158/90 | HR 97 | Temp 98.0°F | Ht <= 58 in | Wt 116.4 lb

## 2014-07-31 DIAGNOSIS — L989 Disorder of the skin and subcutaneous tissue, unspecified: Secondary | ICD-10-CM

## 2014-07-31 DIAGNOSIS — R600 Localized edema: Secondary | ICD-10-CM

## 2014-07-31 NOTE — Patient Instructions (Signed)
I want to get an MRI of your leg to help get a better idea of what's going on. We will set it up and call to let you know when it will be. For now, try to keep the leg elevated whenever you can.

## 2014-08-01 DIAGNOSIS — L989 Disorder of the skin and subcutaneous tissue, unspecified: Secondary | ICD-10-CM | POA: Insufficient documentation

## 2014-08-01 NOTE — Assessment & Plan Note (Signed)
Pink macule with some central pigmentation and flaking on left cheek. Concern for AK vs. early basal cell. Several other small potential AKs visible on cursory exam of face - refer to dermatology

## 2014-08-01 NOTE — Assessment & Plan Note (Addendum)
Ongoing left leg pitting edema with pain and mild erythema x 2 weeks. Tender on exam. Negative dopplers last week. No improvement - continue compression and elevation - precepted with Dr. Nori Riis, unsure of etiology - will obtain MRI LLE to eval for neoplasm, myositis, lymphatic blockage, etc - f/u in 2 weeks - could consider echo/cards eval if MRI negative given some mild swelling of right leg as well and multiple risk factors, although doubt this is current issue given acute onset without CP or new SOB (patient does have significant COPD with improving control recently)

## 2014-08-01 NOTE — Progress Notes (Signed)
   Subjective:    Patient ID: [name], female    DOB: October 10, 1945, 69 y.o.   MRN: [id number]  HPI Pt presents for f/u of left leg swelling. Patient reports she woke up with a swollen/painful left lower leg about 2 weeks ago. She denies any known injury. She was seen in clinic a few days later and had negative dopplers at that time. She reports the pain/swelling have been relatively stable since that first evaluation. No fever/chills. No CP. SOB is chronic and improving recently on Advair. No fatigue, weakness, dizziness.   Review of Systems See HPI    Objective:   Physical Exam  Constitutional: She is oriented to person, place, and time. She appears well-developed and well-nourished. No distress.  HENT:  Head: Normocephalic and atraumatic.  Eyes: Conjunctivae are normal. Right eye exhibits no discharge. Left eye exhibits no discharge.  Cardiovascular: Normal rate, regular rhythm and normal heart sounds.   No murmur heard. Unable to palpate DP or PT pulses  Pulmonary/Chest: Effort normal. No respiratory distress. She has no wheezes.  Coarse breath sounds bilaterally  Abdominal: She exhibits no distension.  Musculoskeletal:       Left lower leg: She exhibits tenderness and edema. She exhibits no bony tenderness, no deformity and no laceration.       Legs:      Left foot: There is tenderness and swelling. There is normal range of motion, no crepitus, no deformity and no laceration.       Feet:  Neurological: She is alert and oriented to person, place, and time.  Skin: Skin is warm, dry and intact. Lesion noted. No rash noted. She is not diaphoretic.     Psychiatric: She has a normal mood and affect. Her behavior is normal.  Nursing note and vitals reviewed.         Assessment & Plan:

## 2014-08-04 ENCOUNTER — Emergency Department (HOSPITAL_COMMUNITY): Admit: 2014-08-04 | Payer: Medicare Other

## 2014-08-04 ENCOUNTER — Encounter (HOSPITAL_COMMUNITY): Payer: Self-pay

## 2014-08-04 ENCOUNTER — Emergency Department (HOSPITAL_COMMUNITY): Payer: Medicare Other

## 2014-08-04 ENCOUNTER — Emergency Department (HOSPITAL_COMMUNITY)
Admission: EM | Admit: 2014-08-04 | Discharge: 2014-08-04 | Disposition: A | Discharge status: Home / Self Care | Payer: Medicare Other | Attending: Emergency Medicine | Admitting: Emergency Medicine

## 2014-08-04 DIAGNOSIS — Z8719 Personal history of other diseases of the digestive system: Secondary | ICD-10-CM | POA: Diagnosis not present

## 2014-08-04 DIAGNOSIS — Y999 Unspecified external cause status: Secondary | ICD-10-CM | POA: Diagnosis not present

## 2014-08-04 DIAGNOSIS — Y929 Unspecified place or not applicable: Secondary | ICD-10-CM | POA: Diagnosis not present

## 2014-08-04 DIAGNOSIS — F329 Major depressive disorder, single episode, unspecified: Secondary | ICD-10-CM | POA: Diagnosis not present

## 2014-08-04 DIAGNOSIS — F419 Anxiety disorder, unspecified: Secondary | ICD-10-CM | POA: Diagnosis not present

## 2014-08-04 DIAGNOSIS — S99922A Unspecified injury of left foot, initial encounter: Secondary | ICD-10-CM | POA: Diagnosis present

## 2014-08-04 DIAGNOSIS — F42 Obsessive-compulsive disorder: Secondary | ICD-10-CM | POA: Insufficient documentation

## 2014-08-04 DIAGNOSIS — R011 Cardiac murmur, unspecified: Secondary | ICD-10-CM | POA: Insufficient documentation

## 2014-08-04 DIAGNOSIS — S92302A Fracture of unspecified metatarsal bone(s), left foot, initial encounter for closed fracture: Secondary | ICD-10-CM

## 2014-08-04 DIAGNOSIS — Z8739 Personal history of other diseases of the musculoskeletal system and connective tissue: Secondary | ICD-10-CM | POA: Insufficient documentation

## 2014-08-04 DIAGNOSIS — Z8742 Personal history of other diseases of the female genital tract: Secondary | ICD-10-CM | POA: Insufficient documentation

## 2014-08-04 DIAGNOSIS — J449 Chronic obstructive pulmonary disease, unspecified: Secondary | ICD-10-CM | POA: Insufficient documentation

## 2014-08-04 DIAGNOSIS — S92355A Nondisplaced fracture of fifth metatarsal bone, left foot, initial encounter for closed fracture: Secondary | ICD-10-CM | POA: Diagnosis not present

## 2014-08-04 DIAGNOSIS — Z7951 Long term (current) use of inhaled steroids: Secondary | ICD-10-CM | POA: Diagnosis not present

## 2014-08-04 DIAGNOSIS — Z8711 Personal history of peptic ulcer disease: Secondary | ICD-10-CM | POA: Insufficient documentation

## 2014-08-04 DIAGNOSIS — Y939 Activity, unspecified: Secondary | ICD-10-CM | POA: Diagnosis not present

## 2014-08-04 DIAGNOSIS — Z880 Allergy status to penicillin: Secondary | ICD-10-CM | POA: Insufficient documentation

## 2014-08-04 DIAGNOSIS — Z720 Tobacco use: Secondary | ICD-10-CM | POA: Diagnosis not present

## 2014-08-04 DIAGNOSIS — S92351A Displaced fracture of fifth metatarsal bone, right foot, initial encounter for closed fracture: Secondary | ICD-10-CM | POA: Diagnosis not present

## 2014-08-04 DIAGNOSIS — X58XXXA Exposure to other specified factors, initial encounter: Secondary | ICD-10-CM | POA: Diagnosis not present

## 2014-08-04 DIAGNOSIS — E039 Hypothyroidism, unspecified: Secondary | ICD-10-CM | POA: Diagnosis not present

## 2014-08-04 DIAGNOSIS — Z7982 Long term (current) use of aspirin: Secondary | ICD-10-CM | POA: Diagnosis not present

## 2014-08-04 LAB — CBC WITH DIFFERENTIAL/PLATELET
Basophils Absolute: 0 10*3/uL (ref 0.0–0.1)
Basophils Relative: 0 % (ref 0–1)
Eosinophils Absolute: 0.3 10*3/uL (ref 0.0–0.7)
Eosinophils Relative: 5 % (ref 0–5)
HCT: 36.3 % (ref 36.0–46.0)
Hemoglobin: 11.9 g/dL — ABNORMAL LOW (ref 12.0–15.0)
Lymphocytes Relative: 20 % (ref 12–46)
Lymphs Abs: 0.9 10*3/uL (ref 0.7–4.0)
MCH: 32.9 pg (ref 26.0–34.0)
MCHC: 32.8 g/dL (ref 30.0–36.0)
MCV: 100.3 fL — ABNORMAL HIGH (ref 78.0–100.0)
Monocytes Absolute: 0.6 10*3/uL (ref 0.1–1.0)
Monocytes Relative: 12 % (ref 3–12)
Neutro Abs: 2.9 10*3/uL (ref 1.7–7.7)
Neutrophils Relative %: 63 % (ref 43–77)
Platelets: 269 10*3/uL (ref 150–400)
RBC: 3.62 MIL/uL — ABNORMAL LOW (ref 3.87–5.11)
RDW: 14.5 % (ref 11.5–15.5)
WBC: 4.6 10*3/uL (ref 4.0–10.5)

## 2014-08-04 LAB — BASIC METABOLIC PANEL
Anion gap: 7 (ref 5–15)
BUN: 13 mg/dL (ref 6–20)
CO2: 28 mmol/L (ref 22–32)
Calcium: 9 mg/dL (ref 8.9–10.3)
Chloride: 107 mmol/L (ref 101–111)
Creatinine, Ser: 0.88 mg/dL (ref 0.44–1.00)
GFR calc Af Amer: >60 mL/min (ref >60)
GFR calc non Af Amer: >60 mL/min (ref >60)
Glucose, Bld: 94 mg/dL (ref 70–99)
Potassium: 3.5 mmol/L (ref 3.5–5.1)
Sodium: 142 mmol/L (ref 135–145)

## 2014-08-04 LAB — C-REACTIVE PROTEIN: CRP: <0.5 mg/dL (ref <1.0)

## 2014-08-04 LAB — SEDIMENTATION RATE: Sed Rate: 15 mm/hr (ref 0–22)

## 2014-08-04 MED ORDER — HYDROCODONE-ACETAMINOPHEN 5-325 MG PO TABS: 1.0000 | ORAL_TABLET | ORAL | Status: DC | PRN

## 2014-08-04 MED ORDER — OXYCODONE-ACETAMINOPHEN 5-325 MG PO TABS
2.0000 | ORAL_TABLET | Freq: Once | ORAL | Status: DC
  Filled 2014-08-04: qty 2

## 2014-08-04 MED ORDER — OXYCODONE-ACETAMINOPHEN 5-325 MG PO TABS: 1.0000 | ORAL_TABLET | Freq: Once | ORAL | Status: DC

## 2014-08-04 NOTE — ED Notes (Signed)
Paged ortho for CAM walker and crutches

## 2014-08-04 NOTE — Discharge Instructions (Signed)
Metatarsal Fracture, Undisplaced  A metatarsal fracture is a break in the bone(s) of the foot. These are the bones of the foot that connect your toes to the bones of the ankle.  DIAGNOSIS   The diagnoses of these fractures are usually made with X-rays. If there are problems in the forefoot and x-rays are normal a later bone scan will usually make the diagnosis.   TREATMENT AND HOME CARE INSTRUCTIONS  · Treatment may or may not include a cast or walking shoe. When casts are needed the use is usually for short periods of time so as not to slow down healing with muscle wasting (atrophy).  · Activities should be stopped until further advised by your caregiver.  · Wear shoes with adequate shock absorbing capabilities and stiff soles.  · Alternative exercise may be undertaken while waiting for healing. These may include bicycling and swimming, or as your caregiver suggests.  · It is important to keep all follow-up visits or specialty referrals. The failure to keep these appointments could result in improper bone healing and chronic pain or disability.  · Warning: Do not drive a car or operate a motor vehicle until your caregiver specifically tells you it is safe to do so.  IF YOU DO NOT HAVE A CAST OR SPLINT:  · You may walk on your injured foot as tolerated or advised.  · Do not put any weight on your injured foot for as long as directed by your caregiver. Slowly increase the amount of time you walk on the foot as the pain allows or as advised.  · Use crutches until you can bear weight without pain. A gradual increase in weight bearing may help.  · Apply ice to the injury for 15-20 minutes each hour while awake for the first 2 days. Put the ice in a plastic bag and place a towel between the bag of ice and your skin.  · Only take over-the-counter or prescription medicines for pain, discomfort, or fever as directed by your caregiver.  SEEK IMMEDIATE MEDICAL CARE IF:   · Your cast gets damaged or breaks.  · You have  continued severe pain or more swelling than you did before the cast was put on, or the pain is not controlled with medications.  · Your skin or nails below the injury turn blue or grey, or feel cold or numb.  · There is a bad smell, or new stains or pus-like (purulent) drainage coming from the cast.  MAKE SURE YOU:   · Understand these instructions.  · Will watch your condition.  · Will get help right away if you are not doing well or get worse.  Document Released: 12/12/2001 Document Revised: 06/14/2011 Document Reviewed: 11/03/2007  [employer]® Patient Information ©2015 [employer]. This information is not intended to replace advice given to you by your health care provider. Make sure you discuss any questions you have with your health care provider.

## 2014-08-04 NOTE — Progress Notes (Signed)
Orthopedic Tech Progress Note Patient Details:  [name] [birth year] [id number]  Ortho Devices Type of Ortho Device: CAM walker, Crutches Ortho Device/Splint Interventions: Application   Cammer, Theodoro Parma 08/04/2014, 7:10 PM

## 2014-08-04 NOTE — ED Notes (Signed)
Transported to xray 

## 2014-08-04 NOTE — ED Provider Notes (Signed)
CSN: [id number]     Arrival date & time 08/04/14  1414 History   First MD Initiated Contact with Patient 08/04/14 1533     Chief Complaint  Patient presents with  . Foot Pain  . Leg Swelling      HPI Patient presents emergency department with ongoing pain in her left foot over the past several weeks.  She does not remember any injury or trauma.  She reports mild pain and swelling of the left foot and left ankle.  She was seen by her primary care physician and was told that she would need an MRI scan.  She has not had any plain films of her foot at this point.  No fevers or chills.  Pain is worse with palpation of her left foot.  She reports pain with ambulation.  No numbness or tingling.  No weakness of her left leg.  She states she also had a duplex ultrasound done of her left lower extremity which was negative for DVT that was completed on April 18.  She states she is tired of hurting and having pain without a clear understanding and diagnosis.  Past Medical History  Diagnosis Date  . OCD (obsessive compulsive disorder)   . Depression   . Anxiety   . PUD (peptic ulcer disease) 09/2006    H pylori  . Diverticulosis of colon   . GERD (gastroesophageal reflux disease)   . COPD (chronic obstructive pulmonary disease)     Due to long history of tobacco abuse, still smoking  . History of uterine prolapse 10/2004    Dr Cletis Media  . Thyroid disease     Ho R thyroid noduel plus mild hyperthyroidism. Treated with radioiodine therapty on 04/2006. Dr Estrella Myrtle.  [name] Heart murmur   . Osteoporosis    Past Surgical History  Procedure Laterality Date  . Colonoscopy  06/03/2005    Hyperplastic polyps, sigmoid diverticulosis, internal hemorroids  . Ganglion removal  12/2005    R wrist subretinacular dorsal ganglion  . Biopsy thyroid  08/2008    Atypia and Hurtle cells, partial thyroidectomy   . Tonsillectomy and adenoidectomy  10/2004  . Pft  11/2006    Obstructive pattern.  Poor response to bronchodilators.    . Thyroidectomy, partial  08/2008    Dr Ronnald Collum  . Transthoracic echocardiogram  4/0/9811    Normal systolic function.  EF 55-60%.  Mild MR, atria ok, trivial pericardial effusion  . Carotid ultrasound  09/10/09    No significant extracranial carotid artery stenosis, vertebrals are patent with antegrade flow.   . Mri head  09/20/09    No acute intracranial abn.  Signal abnormality suggestive of chronic small vessel ischemia, maximal in the right subinsular white and deep gray mater.   . Tvt  06/15/10    Tension-free Vaginal Tape, Dr Mancel Bale, for urge incontinence  . Abdominal hysterectomy    . Bladder suspension      mesh sling   Family History  Problem Relation Age of Onset  . Heart disease Mother   . Heart disease Maternal Grandmother   . Heart disease Maternal Grandfather   . Cirrhosis Sister   . Hypertension Sister   . Other Neg Hx    History  Substance Use Topics  . Smoking status: Current Every Day Smoker -- 0.20 packs/day for 35 years    Types: Cigarettes  . Smokeless tobacco: Never Used     Comment: smokes less than 1 pack per week. ready to quitt.  [name]  Alcohol Use: No   OB History    Gravida Para Term Preterm AB TAB SAB Ectopic Multiple Living   2         2     Review of Systems  All other systems reviewed and are negative.     Allergies  Seroquel; Amoxicillin; Ciprofloxacin; Haloperidol lactate; Ketorolac tromethamine; Latuda; Thorazine; and Wellbutrin  Home Medications   Prior to Admission medications   Medication Sig Start Date End Date Taking? Authorizing Provider  albuterol (PROVENTIL HFA;VENTOLIN HFA) 108 (90 BASE) MCG/ACT inhaler Inhale 2 puffs into the lungs every 6 (six) hours as needed for wheezing or shortness of breath. 06/26/14   Timmothy Euler, MD  ARIPiprazole (ABILIFY) 2 MG tablet Take 2 mg by mouth daily.  11/21/13   Historical Provider, MD  aspirin 325 MG tablet Take 162.5-325 mg by mouth daily as needed for mild pain, moderate pain or  headache.     Historical Provider, MD  atorvastatin (LIPITOR) 40 MG tablet Take 1 tablet (40 mg total) by mouth daily. 06/15/14   Frazier Richards, MD  COD LIVER OIL PO Take by mouth.    Historical Provider, MD  fluticasone-salmeterol (ADVAIR HFA) 230-21 MCG/ACT inhaler Inhale 2 puffs into the lungs 2 (two) times daily. 06/26/14   Timmothy Euler, MD  folic acid (FOLVITE) 578 MCG tablet Take 400 mcg by mouth daily.    Historical Provider, MD  gabapentin (NEURONTIN) 100 MG capsule Take 1 capsule (100 mg total) by mouth 3 (three) times daily. 07/22/14   Timmothy Euler, MD  HYDROcodone-acetaminophen (NORCO/VICODIN) 5-325 MG per tablet Take 1 tablet by mouth every 4 (four) hours as needed for moderate pain. 08/04/14   Jola Schmidt, MD  levothyroxine (SYNTHROID, LEVOTHROID) 75 MCG tablet TAKE 1 TABLET (75 MCG TOTAL) BY MOUTH DAILY. FOR THYROID. PLEASE KEEP SAME MANUFACTURER. 07/01/14   Frazier Richards, MD  lisinopril (PRINIVIL,ZESTRIL) 5 MG tablet TAKE 1 TABLET (5 MG TOTAL) BY MOUTH DAILY. 10/12/13   Frazier Richards, MD  ondansetron (ZOFRAN ODT) 8 MG disintegrating tablet Take 1 tablet (8 mg total) by mouth every 8 (eight) hours as needed for nausea or vomiting. Patient not taking: Reported on 06/27/2014 06/24/14   Jola Schmidt, MD  potassium chloride SA (K-DUR,KLOR-CON) 20 MEQ tablet Take 1 tablet (20 mEq total) by mouth 2 (two) times daily. 06/24/14   Jola Schmidt, MD  Prenatal Vit-Fe Fumarate-FA (PRENATAL MULTIVITAMIN) TABS tablet Take 0.5 tablets by mouth daily at 12 noon.    Historical Provider, MD  promethazine (PHENERGAN) 25 MG tablet TAKE 1/2 TABLET (12.5 MG TOTAL) BY MOUTH EVERY 8 (EIGHT) HOURS AS NEEDED FOR NAUSEA OR VOMITING. 01/03/14   Frazier Richards, MD  venlafaxine XR (EFFEXOR-XR) 75 MG 24 hr capsule Take 225 mg by mouth daily. 05/20/14   Historical Provider, MD  vitamin C (ASCORBIC ACID) 500 MG tablet Take 6 tablets (3,000 mg total) by mouth daily. 09/26/13   [name], NP  vitamin E 400 UNIT capsule  Take 2 capsules (800 Units total) by mouth daily. 09/26/13   [name], NP  zolpidem (AMBIEN) 5 MG tablet Take 1 tablet (5 mg total) by mouth at bedtime as needed for sleep. 08/20/13   Lupita Dawn, MD   BP 161/76 mmHg  Pulse 84  Temp(Src) 97.9 F (36.6 C) (Oral)  Resp 18  Ht 4\' 6"  (1.372 m)  Wt 116 lb 6.4 oz (52.799 kg)  BMI 28.05 kg/m2  SpO2 95%  LMP 01/09/2012 Physical Exam  Constitutional: She is oriented to person, place, and time. She appears well-developed and well-nourished. No distress.  HENT:  Head: Normocephalic and atraumatic.  Eyes: EOM are normal.  Neck: Normal range of motion.  Cardiovascular: Normal rate, regular rhythm and normal heart sounds.   Pulmonary/Chest: Effort normal and breath sounds normal.  Abdominal: Soft. She exhibits no distension. There is no tenderness.  Musculoskeletal: Normal range of motion.  Point tenderness over the left fifth metatarsal.  Mild associated swelling.  Full range of motion of left ankle.  Normal range of motion of left hip and left knee.  No significant swelling of left lower extremity as compared to right.  Swelling seems to be more prominent on the lateral aspect of her left foot.  Normal PT and DP pulses her left foot.  Neurological: She is alert and oriented to person, place, and time.  Skin: Skin is warm and dry.  Psychiatric: She has a normal mood and affect. Judgment normal.  Nursing note and vitals reviewed.   ED Course  Procedures (including critical care time) Labs Review Labs Reviewed  CBC WITH DIFFERENTIAL/PLATELET - Abnormal; Notable for the following:    RBC 3.62 (*)    Hemoglobin 11.9 (*)    MCV 100.3 (*)    All other components within normal limits  BASIC METABOLIC PANEL  SEDIMENTATION RATE  C-REACTIVE PROTEIN    Imaging Review Dg Foot Complete Left  08/04/2014   CLINICAL DATA:  Left foot pain  EXAM: LEFT FOOT - COMPLETE 3+ VIEW  COMPARISON:  None.  FINDINGS: Nondisplaced fracture involving the  proximal shaft of the 5th metatarsal.  Overlying soft tissue swelling.  The joint spaces are preserved.  IMPRESSION: Nondisplaced fracture involving the proximal 5th metatarsal.   Electronically Signed   By: Julian Hy M.D.   On: 08/04/2014 16:46  I personally reviewed the imaging tests through PACS system I reviewed available ER/hospitalization records through the EMR    EKG Interpretation None      MDM   Final diagnoses:  Fracture of 5th metatarsal, left, closed, initial encounter    Patient with nondisplaced left fifth metatarsal fracture.  This likely is the cause of the patient's pain and swelling over the past several weeks.  Patient be placed in a Cam Walker and put in a nonweightbearing status.  She will use crutches for ambulation.  Orthopedic follow-up.    Jola Schmidt, MD 08/04/14 [phone]

## 2014-08-04 NOTE — ED Notes (Addendum)
Pt here for left foot and left lower leg swelling for a couple weeks. States she has had a study for a blood clot done. Pt states her MD told her she needed an MRI done and she came on because of the pain. Pt had a doppler study on her leg on the 18th of April that was negative for anything. Her MD told her she was a mystery. Pt her for pain control

## 2014-08-08 ENCOUNTER — Ambulatory Visit (HOSPITAL_COMMUNITY): Payer: Medicare Other

## 2014-08-08 ENCOUNTER — Ambulatory Visit: Payer: Medicare Other | Admitting: Cardiology

## 2014-08-13 ENCOUNTER — Ambulatory Visit (HOSPITAL_COMMUNITY): Payer: Medicare Other

## 2014-08-14 ENCOUNTER — Ambulatory Visit: Payer: Self-pay | Admitting: Cardiology

## 2014-08-15 ENCOUNTER — Encounter: Payer: Self-pay | Admitting: Cardiology

## 2014-08-16 ENCOUNTER — Ambulatory Visit (INDEPENDENT_AMBULATORY_CARE_PROVIDER_SITE_OTHER): Payer: Medicare Other | Admitting: Family Medicine

## 2014-08-16 ENCOUNTER — Encounter: Payer: Self-pay | Admitting: Family Medicine

## 2014-08-16 VITALS — BP 112/69 | HR 101 | Temp 98.1°F | Ht <= 58 in | Wt 112.0 lb

## 2014-08-16 DIAGNOSIS — R300 Dysuria: Secondary | ICD-10-CM | POA: Diagnosis present

## 2014-08-16 DIAGNOSIS — N390 Urinary tract infection, site not specified: Secondary | ICD-10-CM | POA: Insufficient documentation

## 2014-08-16 DIAGNOSIS — M25511 Pain in right shoulder: Secondary | ICD-10-CM

## 2014-08-16 DIAGNOSIS — S92309A Fracture of unspecified metatarsal bone(s), unspecified foot, initial encounter for closed fracture: Secondary | ICD-10-CM | POA: Insufficient documentation

## 2014-08-16 DIAGNOSIS — S92302D Fracture of unspecified metatarsal bone(s), left foot, subsequent encounter for fracture with routine healing: Secondary | ICD-10-CM

## 2014-08-16 DIAGNOSIS — M25519 Pain in unspecified shoulder: Secondary | ICD-10-CM | POA: Insufficient documentation

## 2014-08-16 DIAGNOSIS — N3000 Acute cystitis without hematuria: Secondary | ICD-10-CM

## 2014-08-16 LAB — POCT URINALYSIS DIPSTICK
Bilirubin, UA: NEGATIVE
Blood, UA: NEGATIVE
Glucose, UA: NEGATIVE
Ketones, UA: NEGATIVE
Nitrite, UA: NEGATIVE
Protein, UA: NEGATIVE
Spec Grav, UA: 1.01
Urobilinogen, UA: 0.2
pH, UA: 6.5

## 2014-08-16 LAB — POCT UA - MICROSCOPIC ONLY

## 2014-08-16 MED ORDER — CEPHALEXIN 500 MG PO CAPS: 500.0000 mg | ORAL_CAPSULE | Freq: Three times a day (TID) | ORAL | Status: DC

## 2014-08-16 MED ORDER — MELOXICAM 15 MG PO TABS: 15.0000 mg | ORAL_TABLET | Freq: Every day | ORAL | Status: DC

## 2014-08-16 NOTE — Patient Instructions (Signed)
I am prescribing an antiinflammatory medicine for your shoulder pain. You can take half a pill at first and then a hole pill as needed for your pain. The orthopedist may want to make changes to this so be sure and mention to him what you are taking.  Thank you.

## 2014-08-20 DIAGNOSIS — S92355A Nondisplaced fracture of fifth metatarsal bone, left foot, initial encounter for closed fracture: Secondary | ICD-10-CM | POA: Diagnosis not present

## 2014-08-21 DIAGNOSIS — F332 Major depressive disorder, recurrent severe without psychotic features: Secondary | ICD-10-CM | POA: Diagnosis not present

## 2014-08-27 ENCOUNTER — Ambulatory Visit: Payer: Self-pay | Admitting: Cardiology

## 2014-08-27 NOTE — Progress Notes (Signed)
   Subjective:    Patient ID: [name], female    DOB: 1945/10/07, 69 y.o.   MRN: [id number]  HPI Pt presents for ED f/u. She has been seen in clinic for left leg swelling and pain several times over the past few weeks and had negative dopplers. Pain did not seem to localize and swelling was very diffuse. She was seen in the ED last week and had an xray that revealed a 5th metatarsal fracture. She has been in a boot since then but not using her crutches as directed. Per ED notes she was to f/u with ortho but she has not heard anything about this. Her foot is still quite painful and tender but the swelling has gone down and she feels she is doing better overall.   Review of Systems See HPI    Objective:   Physical Exam  Constitutional: She is oriented to person, place, and time. She appears well-developed and well-nourished. No distress.  HENT:  Head: Normocephalic and atraumatic.  Eyes: Conjunctivae are normal. Right eye exhibits no discharge. Left eye exhibits no discharge.  Cardiovascular: Normal rate.   Pulmonary/Chest: Effort normal. No respiratory distress.  Abdominal: She exhibits no distension.  Musculoskeletal:       Right shoulder: She exhibits pain. She exhibits normal range of motion, no tenderness, no bony tenderness, no swelling, no effusion, no crepitus, no deformity, no laceration, no spasm and normal strength.       Left ankle: Normal.       Left foot: There is tenderness, bony tenderness and swelling.       Feet:  Neurological: She is alert and oriented to person, place, and time.  Skin: Skin is warm and dry. She is not diaphoretic.  Psychiatric: She has a normal mood and affect.  Nursing note and vitals reviewed.         Assessment & Plan:

## 2014-08-27 NOTE — Assessment & Plan Note (Signed)
3 weeks of pain and swelling to knee, metatarsal fx diagnosed in ED, told to f/u with ortho but does not recall this and has not - refer to ortho for further management - continue boot and crutches until she sees them

## 2014-08-27 NOTE — Assessment & Plan Note (Signed)
Right shoulder pain with abduction, intermittent, benign exam - trial of mobic - rest, ice - f/u in 1 month, sooner if not improving

## 2014-08-27 NOTE — Assessment & Plan Note (Signed)
Dysuria x 3 days, funny smell and color. UA w/ small LE, trace bacterial and occasional leukocytes - will treat for UTI, keflex x1 week

## 2014-09-03 ENCOUNTER — Other Ambulatory Visit: Payer: Self-pay | Admitting: Family Medicine

## 2014-09-03 DIAGNOSIS — S92355D Nondisplaced fracture of fifth metatarsal bone, left foot, subsequent encounter for fracture with routine healing: Secondary | ICD-10-CM | POA: Diagnosis not present

## 2014-09-03 DIAGNOSIS — E030 Congenital hypothyroidism with diffuse goiter: Secondary | ICD-10-CM

## 2014-09-03 DIAGNOSIS — I10 Essential (primary) hypertension: Secondary | ICD-10-CM

## 2014-09-03 DIAGNOSIS — G609 Hereditary and idiopathic neuropathy, unspecified: Secondary | ICD-10-CM

## 2014-09-04 DIAGNOSIS — D239 Other benign neoplasm of skin, unspecified: Secondary | ICD-10-CM | POA: Diagnosis not present

## 2014-09-04 DIAGNOSIS — L821 Other seborrheic keratosis: Secondary | ICD-10-CM | POA: Diagnosis not present

## 2014-09-04 DIAGNOSIS — D179 Benign lipomatous neoplasm, unspecified: Secondary | ICD-10-CM | POA: Diagnosis not present

## 2014-09-17 ENCOUNTER — Ambulatory Visit: Payer: Medicare Other | Admitting: Family Medicine

## 2014-09-26 ENCOUNTER — Encounter: Payer: Self-pay | Admitting: Family Medicine

## 2014-09-26 ENCOUNTER — Ambulatory Visit (INDEPENDENT_AMBULATORY_CARE_PROVIDER_SITE_OTHER): Payer: Medicare Other | Admitting: Family Medicine

## 2014-09-26 VITALS — Ht <= 58 in | Wt 111.0 lb

## 2014-09-26 DIAGNOSIS — M542 Cervicalgia: Secondary | ICD-10-CM | POA: Diagnosis present

## 2014-09-26 MED ORDER — PROMETHAZINE HCL 25 MG PO TABS: ORAL_TABLET | ORAL | Status: DC
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
## 2014-09-26 NOTE — Patient Instructions (Signed)
It was great seeing you today.   1. Your neck pain is likely due to muscle tension.  Do the neck stretches after applying heating pad for 15-20 minutes 3-4 times a week.  2. Follow-up with your primary care doctor, Dr. Sherril Cong in 1 month if her symptoms are not improving  If you have any questions or concerns before then, please call the clinic at [phone].  Take Care,   Dr Phill Myron

## 2014-10-01 DIAGNOSIS — S92355D Nondisplaced fracture of fifth metatarsal bone, left foot, subsequent encounter for fracture with routine healing: Secondary | ICD-10-CM | POA: Diagnosis not present

## 2014-10-01 DIAGNOSIS — M25511 Pain in right shoulder: Secondary | ICD-10-CM | POA: Diagnosis not present

## 2014-10-10 DIAGNOSIS — R159 Full incontinence of feces: Secondary | ICD-10-CM | POA: Diagnosis not present

## 2014-10-29 ENCOUNTER — Ambulatory Visit (INDEPENDENT_AMBULATORY_CARE_PROVIDER_SITE_OTHER): Payer: Medicare Other | Admitting: Family Medicine

## 2014-10-29 ENCOUNTER — Encounter: Payer: Self-pay | Admitting: Family Medicine

## 2014-10-29 VITALS — BP 121/82 | HR 109 | Temp 97.5°F | Ht <= 58 in | Wt 115.2 lb

## 2014-10-29 DIAGNOSIS — I83813 Varicose veins of bilateral lower extremities with pain: Secondary | ICD-10-CM | POA: Insufficient documentation

## 2014-10-29 NOTE — Assessment & Plan Note (Signed)
Painful varicose veins LLE>RLE, wants intervention  - refer to vascular surgery - elevation when not ambulating

## 2014-10-29 NOTE — Progress Notes (Signed)
   Subjective:   [name] is a 69 y.o. female with a history of schizophrenia and hypothyroidism here for varicose veins  Patient reports purple veins on her legs since pregnancy many years ago that have gradually worsened over the years and become more distended and painful over the past few months, especially on her left leg. Patient reports the veins hurt at night when she lays on her side or whenever they are touched. She is also quite self conscious about their appearance.   Review of Systems:  Per HPI. All other systems reviewed and are negative.   PMH, PSH, Medications, Allergies, and FmHx reviewed and updated in EMR.  Social History: current smoker  Objective:  BP 121/82 mmHg  Pulse 109  Temp(Src) 97.5 F (36.4 C) (Oral)  Ht 4\' 6"  (1.372 m)  Wt 115 lb 3.2 oz (52.254 kg)  BMI 27.76 kg/m2  LMP 01/09/2012  Gen:  69 y.o. female in NAD HEENT: NCAT, MMM, EOMI, PERRL, anicteric sclerae CV: RRR, no MRG, no JVD Resp: Non-labored, CTAB, no wheezes noted Abd: Soft, NTND, BS present, no guarding or organomegaly Ext: WWP, trace LLE edema and purple distended varicose veins most prominent on the lateral left calf and thigh. Tender to palpation over several veins near left knee. MSK: Full ROM, strength intact Neuro: Alert and oriented, speech normal      Chemistry      Component Value Date/Time   NA 142 08/04/2014 1645   K 3.5 08/04/2014 1645   CL 107 08/04/2014 1645   CO2 28 08/04/2014 1645   BUN 13 08/04/2014 1645   CREATININE 0.88 08/04/2014 1645   CREATININE 0.75 05/17/2013 1701      Component Value Date/Time   CALCIUM 9.0 08/04/2014 1645   ALKPHOS 104 06/24/2014 0900   AST 19 06/24/2014 0900   ALT 15 06/24/2014 0900   BILITOT 0.6 06/24/2014 0900      Lab Results  Component Value Date   WBC 4.6 08/04/2014   HGB 11.9* 08/04/2014   HCT 36.3 08/04/2014   MCV 100.3* 08/04/2014   PLT 269 08/04/2014   Lab Results  Component Value Date   TSH 1.806 11/23/2013     Lab Results  Component Value Date   HGBA1C  10/19/2009    5.4 (NOTE)                                                                       According to the ADA Clinical Practice Recommendations for 2011, when HbA1c is used as a screening test:   >=6.5%   Diagnostic of Diabetes Mellitus           (if abnormal result  is confirmed)  5.7-6.4%   Increased risk of developing Diabetes Mellitus  References:Diagnosis and Classification of Diabetes Mellitus,Diabetes TDSK,8768,11(XBWIO 1):S62-S69 and Standards of Medical Care in         Diabetes - 2011,Diabetes Care,2011,34  (Suppl 1):S11-S61.   Assessment:     [name] is a 69 y.o. female here for varicose veins    Plan:     See problem list for problem-specific plans.   Frazier Richards, MD PGY-3,  [facility] 10/29/2014  2:10 PM

## 2014-10-29 NOTE — Patient Instructions (Signed)
Thank you for coming in today!  For your painful varicose veins we will refer you to the vascular surgeon that we spoke about The lump on your neck is nothing to worry about it is just a small benign lymph node. We would not recommend plasma donation considering you are on some medications that others may not tolerate. Glad to see that your swelling in your leg has gone down try to elevate your legs as this may help with any residual swelling.   Varicose Veins Varicose veins are veins that have become enlarged and twisted. CAUSES This condition is the result of valves in the veins not working properly. Valves in the veins help return blood from the leg to the heart. If these valves are damaged, blood flows backwards and backs up into the veins in the leg near the skin. This causes the veins to become larger. People who are on their feet a lot, who are pregnant, or who are overweight are more likely to develop varicose veins. SYMPTOMS   Bulging, twisted-appearing, bluish veins, most commonly found on the legs.  Leg pain or a feeling of heaviness. These symptoms may be worse at the end of the day.  Leg swelling.  Skin color changes. DIAGNOSIS  Varicose veins can usually be diagnosed with an exam of your legs by your caregiver. He or she may recommend an ultrasound of your leg veins. TREATMENT  Most varicose veins can be treated at home.However, other treatments are available for people who have persistent symptoms or who want to treat the cosmetic appearance of the varicose veins. These include:  Laser treatment of very small varicose veins.  Medicine that is shot (injected) into the vein. This medicine hardens the walls of the vein and closes off the vein. This treatment is called sclerotherapy. Afterwards, you may need to wear clothing or bandages that apply pressure.  Surgery. HOME CARE INSTRUCTIONS   Do not stand or sit in one position for long periods of time. Do not sit with your  legs crossed. Rest with your legs raised during the day.  Wear elastic stockings or support hose. Do not wear other tight, encircling garments around the legs, pelvis, or waist.  Walk as much as possible to increase blood flow.  Raise the foot of your bed at night with 2-inch blocks.  If you get a cut in the skin over the vein and the vein bleeds, lie down with your leg raised and press on it with a clean cloth until the bleeding stops. Then place a bandage (dressing) on the cut. See your caregiver if it continues to bleed or needs stitches. SEEK MEDICAL CARE IF:   The skin around your ankle starts to break down.  You have pain, redness, tenderness, or hard swelling developing in your leg over a vein.  You are uncomfortable due to leg pain. Document Released: 12/30/2004 Document Revised: 06/14/2011 Document Reviewed: 05/18/2010 [facility] Patient Information 2015 [place], [place]. This information is not intended to replace advice given to you by your health care provider. Make sure you discuss any questions you have with your health care provider.

## 2014-10-30 DIAGNOSIS — S92355D Nondisplaced fracture of fifth metatarsal bone, left foot, subsequent encounter for fracture with routine healing: Secondary | ICD-10-CM | POA: Diagnosis not present

## 2014-10-30 DIAGNOSIS — M25511 Pain in right shoulder: Secondary | ICD-10-CM | POA: Diagnosis not present

## 2014-11-10 ENCOUNTER — Other Ambulatory Visit: Payer: Self-pay | Admitting: Family Medicine

## 2014-11-10 DIAGNOSIS — R112 Nausea with vomiting, unspecified: Secondary | ICD-10-CM

## 2014-11-13 ENCOUNTER — Ambulatory Visit (INDEPENDENT_AMBULATORY_CARE_PROVIDER_SITE_OTHER): Payer: Medicare Other | Admitting: Family Medicine

## 2014-11-13 VITALS — BP 114/70 | HR 115 | Temp 98.2°F | Ht <= 58 in | Wt 116.5 lb

## 2014-11-13 DIAGNOSIS — F489 Nonpsychotic mental disorder, unspecified: Secondary | ICD-10-CM | POA: Diagnosis not present

## 2014-11-13 DIAGNOSIS — N393 Stress incontinence (female) (male): Secondary | ICD-10-CM | POA: Diagnosis not present

## 2014-11-13 DIAGNOSIS — G609 Hereditary and idiopathic neuropathy, unspecified: Secondary | ICD-10-CM | POA: Diagnosis not present

## 2014-11-13 DIAGNOSIS — R300 Dysuria: Secondary | ICD-10-CM

## 2014-11-13 DIAGNOSIS — M75101 Unspecified rotator cuff tear or rupture of right shoulder, not specified as traumatic: Secondary | ICD-10-CM | POA: Diagnosis not present

## 2014-11-13 LAB — POCT URINALYSIS DIPSTICK
Bilirubin, UA: NEGATIVE
Blood, UA: NEGATIVE
Glucose, UA: NEGATIVE
Ketones, UA: NEGATIVE
Leukocytes, UA: NEGATIVE
Nitrite, UA: NEGATIVE
Protein, UA: NEGATIVE
Spec Grav, UA: 1.01
Urobilinogen, UA: 0.2
pH, UA: 6

## 2014-11-13 MED ORDER — GABAPENTIN 100 MG PO CAPS: ORAL_CAPSULE | ORAL | Status: DC

## 2014-11-13 NOTE — Patient Instructions (Signed)
I am sorry life is so tough right now.   You sound like you have a lot on you right now.   You do not have a bladder infection. I did send in another prescription for the gabapentin which should help with the pain. Follow up with the surgeons about the rotator cuff and the rectal problems.

## 2014-11-14 ENCOUNTER — Encounter: Payer: Self-pay | Admitting: Family Medicine

## 2014-11-14 DIAGNOSIS — M75101 Unspecified rotator cuff tear or rupture of right shoulder, not specified as traumatic: Secondary | ICD-10-CM | POA: Insufficient documentation

## 2014-11-14 NOTE — Assessment & Plan Note (Signed)
Also perhaps urge incontinence.  No UTI.  No change in treatment.

## 2014-11-14 NOTE — Progress Notes (Signed)
   Subjective:    Patient ID: [name], female    DOB: 1946-03-04, 69 y.o.   MRN: [id number]  HPI Twas an interesting visit.  Despite my best efforts to agenda set, I failed.  She carries several psych diagnoses.  Her stream of complaints made it difficult to tell which was new/old and which was priority.  Complaints included: Depressed: No SI or HI Uriniary frequency - some new and wanted checked for UTI.  Also urinary incontinence which is chronic.  Sounds mostly urge incontinence. Rectal incontinence on occasion.  Currently being seen by general surg.  They attribute to old obstetrical tear and have suggested possible surgery to repair.  Does not want rectal exam by me today. Back pain from lumbar to cervical spine.  Chronic.  With urninary and rectal incotinence, I worried about some spinal cord compression.  Did not complain of leg pain or saddle anesthesia.  Quickly moved on to other complaints - so I did not feel this was a new/acute problem. Rt shoulder pain.  Told by ortho rotator cuff syndrome.  She had one shoulder injection without relief.  Now contemplating surgery.    Review of Systems     Objective:   Physical Exam  Lungs clear Cardiac RRR without m or g Abd benign Rt shoulder + compression test. Legs, normal reflexes, gait and sensation.        Assessment & Plan:

## 2014-11-14 NOTE — Assessment & Plan Note (Signed)
I left the room believing that her primary problem was psychiatric and was manefesting with a host of somatic complaints.  I am not sure why she needed a work in visit rather than seeing her PCP.  On the good side, she seemed to be both calmed and reassured at the conclusion of my visit with her.  Given the confusing, multiple complaints, I directed her back to her PCP.  No change in meds.

## 2014-11-14 NOTE — Assessment & Plan Note (Signed)
Offered repeat shoulder injection.  She differed to ortho.

## 2014-11-15 ENCOUNTER — Ambulatory Visit: Payer: Medicare Other | Admitting: Family Medicine

## 2014-11-16 ENCOUNTER — Emergency Department (HOSPITAL_COMMUNITY)
Admission: EM | Admit: 2014-11-16 | Discharge: 2014-11-16 | Disposition: A | Discharge status: Home / Self Care | Payer: Medicare Other | Attending: Emergency Medicine | Admitting: Emergency Medicine

## 2014-11-16 ENCOUNTER — Encounter (HOSPITAL_COMMUNITY): Payer: Self-pay

## 2014-11-16 DIAGNOSIS — M62838 Other muscle spasm: Secondary | ICD-10-CM | POA: Diagnosis not present

## 2014-11-16 DIAGNOSIS — Z720 Tobacco use: Secondary | ICD-10-CM | POA: Insufficient documentation

## 2014-11-16 DIAGNOSIS — F419 Anxiety disorder, unspecified: Secondary | ICD-10-CM | POA: Insufficient documentation

## 2014-11-16 DIAGNOSIS — Z79899 Other long term (current) drug therapy: Secondary | ICD-10-CM | POA: Diagnosis not present

## 2014-11-16 DIAGNOSIS — J449 Chronic obstructive pulmonary disease, unspecified: Secondary | ICD-10-CM | POA: Insufficient documentation

## 2014-11-16 DIAGNOSIS — Z791 Long term (current) use of non-steroidal anti-inflammatories (NSAID): Secondary | ICD-10-CM | POA: Diagnosis not present

## 2014-11-16 DIAGNOSIS — R011 Cardiac murmur, unspecified: Secondary | ICD-10-CM | POA: Diagnosis not present

## 2014-11-16 DIAGNOSIS — K219 Gastro-esophageal reflux disease without esophagitis: Secondary | ICD-10-CM | POA: Diagnosis not present

## 2014-11-16 DIAGNOSIS — Z7951 Long term (current) use of inhaled steroids: Secondary | ICD-10-CM | POA: Diagnosis not present

## 2014-11-16 DIAGNOSIS — F329 Major depressive disorder, single episode, unspecified: Secondary | ICD-10-CM | POA: Insufficient documentation

## 2014-11-16 DIAGNOSIS — R51 Headache: Secondary | ICD-10-CM | POA: Diagnosis not present

## 2014-11-16 DIAGNOSIS — E079 Disorder of thyroid, unspecified: Secondary | ICD-10-CM | POA: Diagnosis not present

## 2014-11-16 DIAGNOSIS — Z7982 Long term (current) use of aspirin: Secondary | ICD-10-CM | POA: Diagnosis not present

## 2014-11-16 DIAGNOSIS — R300 Dysuria: Secondary | ICD-10-CM | POA: Insufficient documentation

## 2014-11-16 DIAGNOSIS — Z8711 Personal history of peptic ulcer disease: Secondary | ICD-10-CM | POA: Insufficient documentation

## 2014-11-16 DIAGNOSIS — M542 Cervicalgia: Secondary | ICD-10-CM | POA: Diagnosis present

## 2014-11-16 DIAGNOSIS — Z880 Allergy status to penicillin: Secondary | ICD-10-CM | POA: Diagnosis not present

## 2014-11-16 LAB — BASIC METABOLIC PANEL
Anion gap: 10 (ref 5–15)
BUN: 9 mg/dL (ref 6–20)
CO2: 26 mmol/L (ref 22–32)
Calcium: 8.6 mg/dL — ABNORMAL LOW (ref 8.9–10.3)
Chloride: 107 mmol/L (ref 101–111)
Creatinine, Ser: 0.7 mg/dL (ref 0.44–1.00)
GFR calc Af Amer: >60 mL/min (ref >60)
GFR calc non Af Amer: >60 mL/min (ref >60)
Glucose, Bld: 104 mg/dL — ABNORMAL HIGH (ref 65–99)
Potassium: 3.9 mmol/L (ref 3.5–5.1)
Sodium: 143 mmol/L (ref 135–145)

## 2014-11-16 LAB — CBC WITH DIFFERENTIAL/PLATELET
Basophils Absolute: 0 10*3/uL (ref 0.0–0.1)
Basophils Relative: 0 % (ref 0–1)
Eosinophils Absolute: 0.2 10*3/uL (ref 0.0–0.7)
Eosinophils Relative: 4 % (ref 0–5)
HCT: 34.7 % — ABNORMAL LOW (ref 36.0–46.0)
Hemoglobin: 11.2 g/dL — ABNORMAL LOW (ref 12.0–15.0)
Lymphocytes Relative: 20 % (ref 12–46)
Lymphs Abs: 1 10*3/uL (ref 0.7–4.0)
MCH: 31.5 pg (ref 26.0–34.0)
MCHC: 32.3 g/dL (ref 30.0–36.0)
MCV: 97.7 fL (ref 78.0–100.0)
Monocytes Absolute: 0.7 10*3/uL (ref 0.1–1.0)
Monocytes Relative: 14 % — ABNORMAL HIGH (ref 3–12)
Neutro Abs: 3 10*3/uL (ref 1.7–7.7)
Neutrophils Relative %: 62 % (ref 43–77)
Platelets: 332 10*3/uL (ref 150–400)
RBC: 3.55 MIL/uL — ABNORMAL LOW (ref 3.87–5.11)
RDW: 13.1 % (ref 11.5–15.5)
WBC: 4.9 10*3/uL (ref 4.0–10.5)

## 2014-11-16 LAB — URINALYSIS, ROUTINE W REFLEX MICROSCOPIC
Bilirubin Urine: NEGATIVE
Glucose, UA: NEGATIVE mg/dL
Hgb urine dipstick: NEGATIVE
Ketones, ur: NEGATIVE mg/dL
Leukocytes, UA: NEGATIVE
Nitrite: NEGATIVE
Protein, ur: NEGATIVE mg/dL
Specific Gravity, Urine: 1.011 (ref 1.005–1.030)
Urobilinogen, UA: 0.2 mg/dL (ref 0.0–1.0)
pH: 6 (ref 5.0–8.0)

## 2014-11-16 MED ORDER — METHOCARBAMOL 500 MG PO TABS: 500.0000 mg | ORAL_TABLET | Freq: Two times a day (BID) | ORAL | Status: DC | PRN

## 2014-11-16 MED ORDER — METHOCARBAMOL 500 MG PO TABS
500.0000 mg | ORAL_TABLET | Freq: Once | ORAL | Status: AC
  Administered 2014-11-16: 500 mg via ORAL
  Filled 2014-11-16: qty 1

## 2014-11-16 NOTE — ED Notes (Signed)
Onset 11-11-14 c/o neck pain, seen PCP several days ago and was given prescription for Neurotin which has not been effective.  Pt having difficulty turning neck side to side without turning upper body.  C/o low grade fevers at night.

## 2014-11-16 NOTE — ED Notes (Signed)
Phlebotomy at the bedside  

## 2014-11-16 NOTE — Discharge Instructions (Signed)

## 2014-11-16 NOTE — ED Notes (Signed)
PA at the bedside.

## 2014-11-16 NOTE — ED Provider Notes (Signed)
CSN: [id number]     Arrival date & time 11/16/14  1123 History   First MD Initiated Contact with Patient 11/16/14 1218     Chief Complaint  Patient presents with  . Neck Pain   [name] is a 69 y.o. female with a history of OCD, depression, anxiety, and COPD who presents to the emergency department complaining of bilateral neck pain which is worse on her left side ongoing for the past 5 days. Patient reports having bilateral neck pain for the past 5 days and reports feeling like there are knots in the back of her neck. She reports it feels like she cannot turn her head side to side without pain. She reports this pain radiates up to the posterior side of her head and causes a headache there. She currently complains of 7 out of 10 pain. Patient reports seeing her primary care provider this past week who placed her on gabapentin which helped somewhat. Patient reports having some nasal congestion, runny nose and postnasal drip currently. She also reports having some burning with urination over the past week. She reports having a temperature of 100.0 two days ago. The patient denies double vision, back pain, abdominal pain, nausea, vomiting, cough, chest pain, shortness of breath, numbness, tingling, weakness, trauma, fall, previous neck injury, or rashes.  (Consider location/radiation/quality/duration/timing/severity/associated sxs/prior Treatment) HPI  Past Medical History  Diagnosis Date  . OCD (obsessive compulsive disorder)   . Depression   . Anxiety   . PUD (peptic ulcer disease) 09/2006    H pylori  . Diverticulosis of colon   . GERD (gastroesophageal reflux disease)   . COPD (chronic obstructive pulmonary disease)     Due to long history of tobacco abuse, still smoking  . History of uterine prolapse 10/2004    Dr Cletis Media  . Thyroid disease     Ho R thyroid noduel plus mild hyperthyroidism. Treated with radioiodine therapty on 04/2006. Dr Estrella Myrtle.  [name] Heart murmur   . Osteoporosis     Past Surgical History  Procedure Laterality Date  . Colonoscopy  06/03/2005    Hyperplastic polyps, sigmoid diverticulosis, internal hemorroids  . Ganglion removal  12/2005    R wrist subretinacular dorsal ganglion  . Biopsy thyroid  08/2008    Atypia and Hurtle cells, partial thyroidectomy   . Tonsillectomy and adenoidectomy  10/2004  . Pft  11/2006    Obstructive pattern.  Poor response to bronchodilators.   . Thyroidectomy, partial  08/2008    Dr Ronnald Collum  . Transthoracic echocardiogram  9/0/2409    Normal systolic function.  EF 55-60%.  Mild MR, atria ok, trivial pericardial effusion  . Carotid ultrasound  09/10/09    No significant extracranial carotid artery stenosis, vertebrals are patent with antegrade flow.   . Mri head  09/20/09    No acute intracranial abn.  Signal abnormality suggestive of chronic small vessel ischemia, maximal in the right subinsular white and deep gray mater.   . Tvt  06/15/10    Tension-free Vaginal Tape, Dr Mancel Bale, for urge incontinence  . Abdominal hysterectomy    . Bladder suspension      mesh sling   Family History  Problem Relation Age of Onset  . Heart disease Mother   . Heart disease Maternal Grandmother   . Heart disease Maternal Grandfather   . Cirrhosis Sister   . Hypertension Sister   . Other Neg Hx    Social History  Substance Use Topics  . Smoking  status: Current Every Day Smoker -- 0.20 packs/day for 35 years    Types: Cigarettes  . Smokeless tobacco: Never Used     Comment: smokes less than 1 pack per week. ready to quitt.  . Alcohol Use: No   OB History    Gravida Para Term Preterm AB TAB SAB Ectopic Multiple Living   2         2     Review of Systems  Constitutional: Positive for fever and chills.  HENT: Positive for congestion, postnasal drip and rhinorrhea. Negative for ear pain, facial swelling, hearing loss, sinus pressure and sore throat.   Eyes: Negative for photophobia, pain and visual disturbance.  Respiratory:  Negative for cough, shortness of breath and wheezing.   Cardiovascular: Negative for chest pain and palpitations.  Gastrointestinal: Negative for nausea, vomiting and abdominal pain.  Genitourinary: Positive for dysuria. Negative for urgency, frequency, hematuria and difficulty urinating.  Musculoskeletal: Positive for neck pain and neck stiffness. Negative for back pain and gait problem.  Skin: Negative for rash.  Neurological: Positive for headaches. Negative for dizziness, syncope, facial asymmetry, speech difficulty, weakness, light-headedness and numbness.      Allergies  Seroquel; Amoxicillin; Ciprofloxacin; Haloperidol lactate; Ketorolac tromethamine; Latuda; Thorazine; and Wellbutrin  Home Medications   Prior to Admission medications   Medication Sig Start Date End Date Taking? Authorizing Provider  albuterol (PROVENTIL HFA;VENTOLIN HFA) 108 (90 BASE) MCG/ACT inhaler Inhale 2 puffs into the lungs every 6 (six) hours as needed for wheezing or shortness of breath. 06/26/14   Timmothy Euler, MD  ARIPiprazole (ABILIFY) 2 MG tablet Take 2 mg by mouth daily.  11/21/13   Historical Provider, MD  aspirin 325 MG tablet Take 162.5-325 mg by mouth daily as needed for mild pain, moderate pain or headache.     Historical Provider, MD  atorvastatin (LIPITOR) 40 MG tablet Take 1 tablet (40 mg total) by mouth daily. 06/15/14   Frazier Richards, MD  COD LIVER OIL PO Take by mouth.    Historical Provider, MD  fluticasone-salmeterol (ADVAIR HFA) 230-21 MCG/ACT inhaler Inhale 2 puffs into the lungs 2 (two) times daily. 06/26/14   Timmothy Euler, MD  folic acid (FOLVITE) 979 MCG tablet Take 400 mcg by mouth daily.    Historical Provider, MD  gabapentin (NEURONTIN) 100 MG capsule TAKE 1 CAPSULE (100 MG TOTAL) BY MOUTH 3 (THREE) TIMES DAILY. 11/13/14   Zenia Resides, MD  levothyroxine (SYNTHROID, LEVOTHROID) 75 MCG tablet TAKE 1 TABLET BY MOUTH DAILY FOR THYROID 09/04/14   Frazier Richards, MD  lisinopril  (PRINIVIL,ZESTRIL) 5 MG tablet TAKE 1 TABLET (5 MG TOTAL) BY MOUTH DAILY. 09/04/14   Frazier Richards, MD  meloxicam (MOBIC) 15 MG tablet Take 1 tablet (15 mg total) by mouth daily. 08/16/14   Frazier Richards, MD  methocarbamol (ROBAXIN) 500 MG tablet Take 1 tablet (500 mg total) by mouth 2 (two) times daily as needed for muscle spasms. 11/16/14   Waynetta Pean, PA-C  ondansetron (ZOFRAN ODT) 8 MG disintegrating tablet Take 1 tablet (8 mg total) by mouth every 8 (eight) hours as needed for nausea or vomiting. Patient not taking: Reported on 06/27/2014 06/24/14   Jola Schmidt, MD  potassium chloride SA (K-DUR,KLOR-CON) 20 MEQ tablet Take 1 tablet (20 mEq total) by mouth 2 (two) times daily. 06/24/14   Jola Schmidt, MD  Prenatal Vit-Fe Fumarate-FA (PRENATAL MULTIVITAMIN) TABS tablet Take 0.5 tablets by mouth daily at 12 noon.  Historical Provider, MD  promethazine (PHENERGAN) 25 MG tablet TAKE 1/2 TABLET (12.5 MG TOTAL) BY MOUTH EVERY 8 (EIGHT) HOURS AS NEEDED FOR NAUSEA OR VOMITING. 11/11/14   Frazier Richards, MD  vitamin C (ASCORBIC ACID) 500 MG tablet Take 6 tablets (3,000 mg total) by mouth daily. 09/26/13   [name], NP  vitamin E 400 UNIT capsule Take 2 capsules (800 Units total) by mouth daily. 09/26/13   [name], NP  zolpidem (AMBIEN) 5 MG tablet Take 1 tablet (5 mg total) by mouth at bedtime as needed for sleep. 08/20/13   Lupita Dawn, MD   BP 139/79 mmHg  Pulse 87  Temp(Src) 98.4 F (36.9 C) (Oral)  Resp 16  Ht 4\' 6"  (1.372 m)  Wt 116 lb 8 oz (52.844 kg)  BMI 28.07 kg/m2  SpO2 97%  LMP 01/09/2012 Physical Exam  Constitutional: She is oriented to person, place, and time. She appears well-developed and well-nourished. No distress.  Nontoxic appearing.  HENT:  Head: Normocephalic and atraumatic.  Right Ear: External ear normal.  Left Ear: External ear normal.  Mouth/Throat: Oropharynx is clear and moist. No oropharyngeal exudate.  Eyes: Conjunctivae and EOM are normal. Pupils are  equal, round, and reactive to light. Right eye exhibits no discharge. Left eye exhibits no discharge.  Neck: Neck supple. No JVD present. No tracheal deviation present. No Brudzinski's sign and no Kernig's sign noted.  Patient reports pain with range of motion of her neck. She is able to turn her neck greater than 45 in each direction. She does this very slowly. She has tenderness to her bilateral neck musculature. No midline neck tenderness. No meningeal signs.  Cardiovascular: Normal rate, regular rhythm and intact distal pulses.  Exam reveals no gallop and no friction rub.   Pulmonary/Chest: Effort normal and breath sounds normal. No respiratory distress. She has no wheezes. She has no rales.  Abdominal: Soft. There is no tenderness. There is no guarding.  Musculoskeletal: She exhibits no edema or tenderness.  Patient has 5 out of 5 strength in her bilateral upper and lower extremities. No lower extremity edema or tenderness. She is able to ambulate without difficulty or assistance and with normal gait. No midline back tenderness.  Lymphadenopathy:    She has no cervical adenopathy.  Neurological: She is alert and oriented to person, place, and time. No cranial nerve deficit. Coordination normal.  The patient is alert and oriented 3. Cranial nerves are intact. No pronator drift. Finger-to-nose is intact bilaterally. Heel-to-shin intact bilaterally. Normal gait. Speech is clear and coherent. Vision is grossly intact. Sensation is intact to bilateral upper and lower extremities.  Skin: Skin is warm and dry. No rash noted. She is not diaphoretic. No erythema. No pallor.  Psychiatric: She has a normal mood and affect. Her behavior is normal.  Nursing note and vitals reviewed.   ED Course  Procedures (including critical care time) Labs Review Labs Reviewed  CBC WITH DIFFERENTIAL/PLATELET - Abnormal; Notable for the following:    RBC 3.55 (*)    Hemoglobin 11.2 (*)    HCT 34.7 (*)     Monocytes Relative 14 (*)    All other components within normal limits  BASIC METABOLIC PANEL - Abnormal; Notable for the following:    Glucose, Bld 104 (*)    Calcium 8.6 (*)    All other components within normal limits  URINALYSIS, ROUTINE W REFLEX MICROSCOPIC (NOT AT [facility])    Imaging Review No results found.  EKG Interpretation None      Filed Vitals:   11/16/14 1330 11/16/14 1400 11/16/14 1430 11/16/14 1537  BP: 131/89 110/74 139/74 139/79  Pulse: 88 86 82 87  Temp:    98.4 F (36.9 C)  TempSrc:    Oral  Resp: 16   16  Height:      Weight:      SpO2: 96% 100% 98% 97%     MDM   Meds given in ED:  Medications  methocarbamol (ROBAXIN) tablet 500 mg (500 mg Oral Given 11/16/14 1352)    New Prescriptions   METHOCARBAMOL (ROBAXIN) 500 MG TABLET    Take 1 tablet (500 mg total) by mouth 2 (two) times daily as needed for muscle spasms.    Final diagnoses:  Neck muscle spasm   This is a 69 y.o. female with a history of OCD, depression, anxiety, and COPD who presents to the emergency department complaining of bilateral neck pain which is worse on her left side ongoing for the past 5 days. Patient reports having bilateral neck pain for the past 5 days and reports feeling like there are knots in the back of her neck. She reports it feels like she cannot turn her head side to side without pain. She reports this pain radiates up to the posterior side of her head and causes a headache there.  She denies numbness, tingling or weakness. She denies back pain. She denies any trauma or injury to her back or neck. She reports subjective fever 2 days ago. On exam the patient is afebrile nontoxic appearing. She has no focal neurological deficits. Patient has bilateral neck tenderness to palpation across her neck musculature. Patient is able to rotate her head greater than 45 in each direction. She has no meningeal signs. She is able to ambulate without difficulty or assistance and with  normal gait. This seems to be neck muscle spasm. Patient given Robaxin in the ED. Patient also reporting dysuria for an unspecified amount of time. Urinalysis was unremarkable. CBC and BMP are unremarkable. At reevaluation the patient reports feeling much better after receiving Robaxin in the emergency department. She tolerated the medicine well. He is ambulating around the emergency department with normal gait. She is able to turn her head greater than 45 in each direction with only little pain. Will discharge a prescription for Robaxin and have her follow up closely with her primary care provider. I advised to use caution when taking naproxen as it can make her drowsy. I advised the patient to follow-up with their primary care provider this week. I advised the patient to return to the emergency department with new or worsening symptoms or new concerns. The patient verbalized understanding and agreement with plan.    This patient was discussed with and evaluated by Dr. Ashok Cordia who agrees with assessment and plan.   Waynetta Pean, PA-C 11/16/14 [place], MD 11/17/14 [phone]

## 2014-11-20 DIAGNOSIS — F332 Major depressive disorder, recurrent severe without psychotic features: Secondary | ICD-10-CM | POA: Diagnosis not present

## 2014-11-26 ENCOUNTER — Other Ambulatory Visit: Payer: Self-pay | Admitting: *Deleted

## 2014-11-26 ENCOUNTER — Ambulatory Visit (INDEPENDENT_AMBULATORY_CARE_PROVIDER_SITE_OTHER): Payer: Medicare Other | Admitting: Family Medicine

## 2014-11-26 ENCOUNTER — Encounter: Payer: Self-pay | Admitting: Family Medicine

## 2014-11-26 VITALS — BP 142/82 | HR 91 | Temp 98.1°F | Wt 114.8 lb

## 2014-11-26 DIAGNOSIS — J329 Chronic sinusitis, unspecified: Secondary | ICD-10-CM | POA: Insufficient documentation

## 2014-11-26 DIAGNOSIS — J0110 Acute frontal sinusitis, unspecified: Secondary | ICD-10-CM | POA: Diagnosis not present

## 2014-11-26 DIAGNOSIS — I83813 Varicose veins of bilateral lower extremities with pain: Secondary | ICD-10-CM

## 2014-11-26 NOTE — Progress Notes (Signed)
   Subjective:    Patient ID: [name], female    DOB: [birth year], 69 y.o.   MRN: [id number]  Seen for Same day visit for   CC: sinus pressure  She reports sinus pressure, headache, subjective fevers and nausea for 1 week.  Denies any rhinorrhea, sore throat, cough, chest pain, shortness of breath.  Denies any sick contacts.  He has reduced smoking to one cigarette daily, but reports her neighbors smoke constantly which fills her apartment.  Has not tried any OTC medications.   Review of Systems   See HPI for ROS. Objective:  BP 142/82 mmHg  Pulse 91  Temp(Src) 98.1 F (36.7 C) (Oral)  Wt 114 lb 12.8 oz (52.073 kg)  LMP 01/09/2012  General: NAD Eyes:Sclera white; Conjunctiva pink; PERRLA; EOMI;  Ears: TMs clear; Canals w/o lacerations; No external lesions Nose: Mucosa pink; No sinus tenderness Throat: Oral mucosa pink, Pharynx w/o exudates Cardiac: RRR, normal heart sounds, no murmurs. 2+ radial and PT pulses bilaterally Respiratory: CTAB, normal effort Extremities: no edema or cyanosis. WWP. Skin: warm and dry, no rashes noted    Assessment & Plan:  See Problem List Documentation

## 2014-11-26 NOTE — Assessment & Plan Note (Signed)
Acute sinusitis, likely viral etiology without red flags - See AVS for symptomatically treatment and return precautions

## 2014-11-26 NOTE — Patient Instructions (Signed)
It was great seeing you today.   Your symptoms are due to a viral illness. Antibiotics will not help improve your symptoms, but the following will help you feel better while your body fights the virus.  Drink lots of water (Guaifenesin "Mucinex") Congestion:  Oral: Pseudoephedrine Sneezing & Runny nose: Antihistamines: Zyrtec, Claritin, Allegra Pain/Sore throat: aspirin  Wash your hands often to prevent spreading the virus   Please bring all your medications to every doctors visit  Sign up for My Chart to have easy access to your labs results, and communication with your Primary care physician.  Next Appointment  Please call Dr Berkley Harvey if you develop fevers greater than 102 or worsening symptoms after starting to get better  If you have any questions or concerns before then, please call the clinic at [phone].  Take Care,   Dr Phill Myron

## 2014-11-27 ENCOUNTER — Ambulatory Visit (INDEPENDENT_AMBULATORY_CARE_PROVIDER_SITE_OTHER): Payer: Medicare Other | Admitting: Family Medicine

## 2014-11-27 ENCOUNTER — Ambulatory Visit: Payer: Medicare Other | Admitting: Family Medicine

## 2014-11-27 VITALS — BP 127/78 | HR 94 | Temp 97.9°F | Ht <= 58 in | Wt 113.7 lb

## 2014-11-27 DIAGNOSIS — M542 Cervicalgia: Secondary | ICD-10-CM

## 2014-11-28 ENCOUNTER — Encounter: Payer: Self-pay | Admitting: Clinical

## 2014-11-28 NOTE — Progress Notes (Signed)
CSW met with pt to complete her advanced directives packet. CSW has met with pt in the past however pt was undecided on several questions. Pt had 4 packets with her all of which had different information filled out. CSW began a new packet however packet could not be completed as pt remained undecided on 1 question. Pt plans to discuss her living will with her sister Hassan Rowan and contact CSW at a later time to complete the packet.  Hunt Oris, MSW, [place]

## 2014-12-04 ENCOUNTER — Ambulatory Visit (INDEPENDENT_AMBULATORY_CARE_PROVIDER_SITE_OTHER): Payer: Medicare Other | Admitting: Family Medicine

## 2014-12-04 ENCOUNTER — Encounter: Payer: Self-pay | Admitting: Family Medicine

## 2014-12-04 VITALS — BP 123/70 | HR 92 | Temp 97.5°F | Wt 115.2 lb

## 2014-12-04 DIAGNOSIS — J309 Allergic rhinitis, unspecified: Secondary | ICD-10-CM | POA: Diagnosis present

## 2014-12-04 MED ORDER — LORATADINE 10 MG PO TABS: 10.0000 mg | ORAL_TABLET | Freq: Every day | ORAL | Status: DC

## 2014-12-04 NOTE — Assessment & Plan Note (Signed)
Symptoms most consistent with allergic rhinitis - Loratadine daily - She will pick up either Nasacort or Flonase over-the-counter

## 2014-12-04 NOTE — Assessment & Plan Note (Addendum)
Recurrent neck pain and new clicking when she walks, exam benign, suspect this is OA/DDD related to patient age - rec taking both gabapentin and mobic sparingly - patient requests herbal remedies so also recommended turmeric for antiinflammatory properties - f/u in 1 month

## 2014-12-04 NOTE — Progress Notes (Signed)
   Subjective:    Patient ID: [name], female    DOB: May 11, 1945, 69 y.o.   MRN: [id number]  Seen for Same day visit for   CC: sinus congestion   She is having some dizziness, sinus pain and pressure  She was wanting to sleep most of the day yesterday and today  Symptoms started yesterday  No fevers, cough, shortness of breath  No sick contacts.  Hx of allergies and not currently taking anything.  Having some watery eyes and sneezing.  Some rhinorrhea.   Review of Systems   See HPI for ROS. Objective:  BP 123/70 mmHg  Pulse 92  Temp(Src) 97.5 F (36.4 C) (Oral)  Wt 115 lb 3.2 oz (52.254 kg)  LMP 01/09/2012  General: NAD HEENT: Tympanic membranes clear and intact bilaterally, no cervical lymphadenopathy, clear conjunctiva, oropharynx clear,  Skin: warm and dry, no rashes noted Neuro: alert and oriented, no focal deficits     Assessment & Plan:   Allergic rhinitis Symptoms most consistent with allergic rhinitis - Loratadine daily - She will pick up either Nasacort or Flonase over-the-counter

## 2014-12-04 NOTE — Patient Instructions (Signed)
Thank you for coming in,   You can pick up nasocort or flonase which are intranasal steroids.   Please bring all of your medications with you to each visit.    Please feel free to call with any questions or concerns at any time, at [phone]. --Dr. Raeford Razor

## 2014-12-04 NOTE — Progress Notes (Signed)
Subjective:   [name] is a 69 y.o. female with a history of HTN, CVA, bipolar, COPD and more here for neck pain.  Patient reports neck pain for several weeks that is dull and nonradiating. It is associated with clicking in the back of her neck when she walks. She has been taking one of the medications i prescribed (gabapentin and mobic) but cannot remember which and says it has been helping some.   Review of Systems:  Per HPI. All other systems reviewed and are negative.   PMH, PSH, Medications, Allergies, and FmHx reviewed and updated in EMR.  Social History: current smoker  Objective:  BP 127/78 mmHg  Pulse 94  Temp(Src) 97.9 F (36.6 C) (Oral)  Ht 4\' 6"  (1.372 m)  Wt 113 lb 11.2 oz (51.574 kg)  BMI 27.40 kg/m2  LMP 01/09/2012  Gen:  69 y.o. female in NAD HEENT: NCAT, MMM, EOMI, PERRL, anicteric sclerae Neck: mild paracervical but no bony tenderness, prominent lordosis again noted CV: RRR, no MRG, no JVD Resp: Non-labored, CTAB, no wheezes noted Abd: Soft, NTND, BS present, no guarding or organomegaly Ext: WWP, no edema MSK: Full ROM, strength intact Neuro: Alert and oriented, speech normal      Chemistry      Component Value Date/Time   NA 143 11/16/2014 1227   K 3.9 11/16/2014 1227   CL 107 11/16/2014 1227   CO2 26 11/16/2014 1227   BUN 9 11/16/2014 1227   CREATININE 0.70 11/16/2014 1227   CREATININE 0.75 05/17/2013 1701      Component Value Date/Time   CALCIUM 8.6* 11/16/2014 1227   ALKPHOS 104 06/24/2014 0900   AST 19 06/24/2014 0900   ALT 15 06/24/2014 0900   BILITOT 0.6 06/24/2014 0900      Lab Results  Component Value Date   WBC 4.9 11/16/2014   HGB 11.2* 11/16/2014   HCT 34.7* 11/16/2014   MCV 97.7 11/16/2014   PLT 332 11/16/2014   Lab Results  Component Value Date   TSH 1.806 11/23/2013   Lab Results  Component Value Date   HGBA1C  10/19/2009    5.4 (NOTE)                                                                        According to the ADA Clinical Practice Recommendations for 2011, when HbA1c is used as a screening test:   >=6.5%   Diagnostic of Diabetes Mellitus           (if abnormal result  is confirmed)  5.7-6.4%   Increased risk of developing Diabetes Mellitus  References:Diagnosis and Classification of Diabetes Mellitus,Diabetes ZOXW,9604,54(UJWJX 1):S62-S69 and Standards of Medical Care in         Diabetes - 2011,Diabetes Care,2011,34  (Suppl 1):S11-S61.   Assessment & Plan:     [name] is a 69 y.o. female here for neck pain  Neck pain Recurrent neck pain and new clicking when she walks, exam benign, suspect this is OA/DDD related to patient age - rec taking both gabapentin and mobic sparingly - patient requests herbal remedies so also recommended turmeric for antiinflammatory properties - f/u in 1 month   Beverlyn Roux, MD, MPH [facility]  PGY-3 12/04/2014 5:22 PM

## 2014-12-04 NOTE — Patient Instructions (Signed)
Cervical Sprain A cervical sprain is when the tissues (ligaments) that hold the neck bones in place stretch or tear. HOME CARE   Put ice on the injured area.  Put ice in a plastic bag.  Place a towel between your skin and the bag.  Leave the ice on for 15-20 minutes, 3-4 times a day.  Only take medicine as told by your doctor.  Keep all doctor visits as told.  Keep all physical therapy visits as told.  Adjust your work station so that you have good posture while you work.  Avoid positions and activities that make your problems worse.  Warm up and stretch before being active. GET HELP IF:  Your pain is not controlled with medicine.  You cannot take less pain medicine over time as planned.  Your activity level does not improve as expected. GET HELP RIGHT AWAY IF:   You are bleeding.  Your stomach is upset.  You have an allergic reaction to your medicine.  You develop new problems that you cannot explain.  You lose feeling (become numb) or you cannot move any part of your body (paralysis).  You have tingling or weakness in any part of your body.  Your symptoms get worse. Symptoms include:  Pain, soreness, stiffness, puffiness (swelling), or a burning feeling in your neck.  Pain when your neck is touched.  Shoulder or upper back pain.  Limited ability to move your neck.  Headache.  Dizziness.  Your hands or arms feel week, lose feeling, or tingle.  Muscle spasms.  Difficulty swallowing or chewing. MAKE SURE YOU:   Understand these instructions.  Will watch your condition.  Will get help right away if you are not doing well or get worse. Document Released: 09/08/2007 Document Revised: 11/22/2012 Document Reviewed: 09/27/2012 [facility] Patient Information 2015 [place], [place]. This information is not intended to replace advice given to you by your health care provider. Make sure you discuss any questions you have with your health care provider.

## 2014-12-12 ENCOUNTER — Ambulatory Visit: Payer: Medicare Other | Admitting: Cardiology

## 2014-12-20 ENCOUNTER — Ambulatory Visit: Payer: Medicare Other | Admitting: Family Medicine

## 2014-12-27 ENCOUNTER — Encounter (HOSPITAL_COMMUNITY): Payer: Medicare Other

## 2014-12-27 ENCOUNTER — Encounter: Payer: Medicare Other | Admitting: Vascular Surgery

## 2015-01-01 ENCOUNTER — Encounter: Payer: Self-pay | Admitting: Vascular Surgery

## 2015-01-03 ENCOUNTER — Encounter: Payer: Medicare Other | Admitting: Vascular Surgery

## 2015-01-03 ENCOUNTER — Encounter (HOSPITAL_COMMUNITY): Payer: Medicare Other

## 2015-01-09 ENCOUNTER — Encounter: Payer: Self-pay | Admitting: Family Medicine

## 2015-01-09 ENCOUNTER — Ambulatory Visit
Admission: RE | Admit: 2015-01-09 | Discharge: 2015-01-09 | Disposition: A | Discharge status: Home / Self Care | Payer: Medicare Other | Source: Ambulatory Visit | Attending: Family Medicine | Admitting: Family Medicine

## 2015-01-09 ENCOUNTER — Telehealth: Payer: Self-pay | Admitting: Family Medicine

## 2015-01-09 ENCOUNTER — Ambulatory Visit (INDEPENDENT_AMBULATORY_CARE_PROVIDER_SITE_OTHER): Payer: Medicare Other | Admitting: Family Medicine

## 2015-01-09 VITALS — BP 124/78 | HR 79 | Temp 97.6°F | Wt 113.0 lb

## 2015-01-09 DIAGNOSIS — S90851A Superficial foreign body, right foot, initial encounter: Secondary | ICD-10-CM | POA: Insufficient documentation

## 2015-01-09 DIAGNOSIS — M79674 Pain in right toe(s): Secondary | ICD-10-CM

## 2015-01-09 DIAGNOSIS — S90521A Blister (nonthermal), right ankle, initial encounter: Secondary | ICD-10-CM | POA: Diagnosis not present

## 2015-01-09 NOTE — Progress Notes (Signed)
Subjective:     Patient ID: [name], female   DOB: [birth year], 69 y.o.   MRN: [id number]  H&P written and performed by Kara Dies Pisanie under supervision from Dr. Brita Romp  HPI  BLISTER ON ANKLE: Patient presents with month long history of a blister which turned into a scab on her right ankle. She was wearing really tight shoes which rubbed her ankles and caused blisters on both ankles her left one has healed (though it was smaller).  The scab has taken too long to heal according to the patient. She wants to know if there is anything that she can put on it or if anything is wrong with it. Mild pain and swelling over hte area according to the patient. Denies fevers, chills, night sweats, leg swelling.   LEFT 5th TOE NAIL DIFFICULT TO CUT: Patient complains of inability to cut her left 5th toe. No pain just annoys her. Want to know if we can cut it.  Review of Systems  Pertinent ROS as per HPI     Objective:   Physical Exam  Focused physical exam.   GEN: Well appearing, NAD EXTREMITIES: Small 1x1cm scab on patients right lateral ankle. No erythema, edema, pus/exudate or other signs of infection noted. Slight tenderness to palpation of her right 5th toe and fifth metatarsal distally with mild erythema and slight swelling. Moderate varicose veins seen bilaterally. Good strength and ROM of lower extremities, no edema, or signs of infection.     Assessment:   LEFT ANKLE SCAB - Reassured patient that healing is fine and that it is not infected. Advised patient to wait for scab to fall of and to not put anything on it or to pick a the scab.  LEFT 5th TOE NAIL: Cut the left 5th toe nail using heavy duty clippers. No bleeding or pain during procedure. Patient Pleased.   Upper Level Addendum:  I have seen and evaluated this patient along with MS3 and reviewed the above note, making necessary revisions in pink.  Please see my assessment and plan below   Blister of right ankle  without infection Well healing, scabbed over Reassured patient Advised her to leave it alone and not pick at it to allow it to heal.  Toe pain Redness over fifth toe of R foot and TTP over metatarsal XRay R foot     Virginia Crews, MD, MPH PGY-2,  [facility] 01/09/2015 4:28 PM

## 2015-01-09 NOTE — Assessment & Plan Note (Signed)
Well healing, scabbed over Reassured patient Advised her to leave it alone and not pick at it to allow it to heal.

## 2015-01-09 NOTE — Progress Notes (Deleted)
   Subjective:   [name] is a 69 y.o. female with a history of *** here for ***  ***  Review of Systems:  Per HPI. All other systems reviewed and are negative.   PMH, PSH, Medications, Allergies, and FmHx reviewed and updated in EMR.  Social History: *** smoker  Objective:  BP 139/120 mmHg  Pulse 79  Temp(Src) 97.6 F (36.4 C) (Oral)  Wt 113 lb (51.256 kg)  SpO2 95%  LMP 01/09/2012  Gen:  69 y.o. female in NAD HEENT: NCAT, MMM, EOMI, PERRL, anicteric sclerae CV: RRR, no MRG, no JVD Resp: Non-labored, CTAB, no wheezes noted Abd: Soft, NTND, BS present, no guarding or organomegaly Ext: WWP, no edema MSK: Full ROM, strength intact Neuro: Alert and oriented, speech normal      Chemistry      Component Value Date/Time   NA 143 11/16/2014 1227   K 3.9 11/16/2014 1227   CL 107 11/16/2014 1227   CO2 26 11/16/2014 1227   BUN 9 11/16/2014 1227   CREATININE 0.70 11/16/2014 1227   CREATININE 0.75 05/17/2013 1701      Component Value Date/Time   CALCIUM 8.6* 11/16/2014 1227   ALKPHOS 104 06/24/2014 0900   AST 19 06/24/2014 0900   ALT 15 06/24/2014 0900   BILITOT 0.6 06/24/2014 0900      Lab Results  Component Value Date   WBC 4.9 11/16/2014   HGB 11.2* 11/16/2014   HCT 34.7* 11/16/2014   MCV 97.7 11/16/2014   PLT 332 11/16/2014   Lab Results  Component Value Date   TSH 1.806 11/23/2013   Lab Results  Component Value Date   HGBA1C  10/19/2009    5.4 (NOTE)                                                                       According to the ADA Clinical Practice Recommendations for 2011, when HbA1c is used as a screening test:   >=6.5%   Diagnostic of Diabetes Mellitus           (if abnormal result  is confirmed)  5.7-6.4%   Increased risk of developing Diabetes Mellitus  References:Diagnosis and Classification of Diabetes Mellitus,Diabetes MBWG,6659,93(TTSVX 1):S62-S69 and Standards of Medical Care in         Diabetes - 2011,Diabetes Care,2011,34    (Suppl 1):S11-S61.   Assessment & Plan:     [name] is a 69 y.o. female here for ***  No problem-specific assessment & plan notes found for this encounter.      Dana Crews, MD MPH PGY-2,  [facility] 01/09/2015  2:15 PM

## 2015-01-09 NOTE — Telephone Encounter (Signed)
Patient now reports that she had been noticing her foot hurting on lateral aspect for unknown amount (really cant quantify), but more than just a couple days.  Discussed with patient that there is foreign body in foot.  She is surprised to hear this and still cannot remember injury or trauma.  Will refer to orthopedics.  UTD on tetanus vaccine per records.  Virginia Crews, MD, MPH PGY-2,  [facility] 01/09/2015 5:15 PM

## 2015-01-09 NOTE — Assessment & Plan Note (Signed)
Redness over fifth toe of R foot and TTP over metatarsal XRay R foot

## 2015-01-09 NOTE — Patient Instructions (Signed)
Nice to meet you today.  Leave that scab alone.  It is NOT infected.  We will get an XRay of your foot.  Someone will call you with the results when they're available.  Take care, Dr. Jacinto Reap  Blisters A blister is a fluid-filled sac that forms between layers of skin. Blisters often form in areas where skin rubs against other skin or rubs against something else. The most common areas for blisters are the hands and feet. CAUSES A blister can be caused by:  An injury.  A burn.  An allergic reaction.  An infection.  Exposure to irritating chemicals.  Friction. Friction blisters often result from:  Sports.  Repetitive activities.  Shoes that are too tight or too loose. SIGNS AND SYMPTOMS A blister is often round and looks like a bump. It may itch or be painful to the touch. The liquid in a blister is clear or bloody. Before a blister forms, the skin may become red, feel warm, itch, or be painful to the touch. DIAGNOSIS A blister can usually be diagnosed from its appearance. TREATMENT Treatment involves protecting the area where the blister has formed until the skin has healed. If something is likely to rub against the blister, apply a bandage (dressing) with a hole in the middle over the blister. Most blisters break open, dry up, and go away on their own within 10 days. Rarely, blisters that are very painful may be drained before they break open on their own. Draining of a blister should only be done by a health care provider under sterile conditions. HOME CARE INSTRUCTIONS  Protect the area where the blister has formed as directed by your health care provider.  Do not open or pop your blister, because it could become infected.  If the blister is very painful, ask your health care provider whether you should have it drained.  If the blister breaks open on its own:  Do not remove the loose skin that is over the blister.  Wash the blister area with soap and water every  day.  After washing the blister area, you may apply an antibiotic cream or ointment and cover the area with a bandage. PREVENTION Taking these steps can help to prevent blisters that are caused by friction:  Wear comfortable shoes that fit well.  Always wear socks with shoes.  Wear extra socks or use tape, bandages, or pads over blister-prone areas as needed.  Wear protective gear, such as gloves, when participating in sports or activities that can cause blisters.  Use powders as needed to keep your feet dry. SEEK MEDICAL CARE IF:  You have increased redness, swelling, or pain in the blister area.  A puslike discharge is coming from the blister area.  You have a fever.  You have chills.   This information is not intended to replace advice given to you by your health care provider. Make sure you discuss any questions you have with your health care provider.   Document Released: 04/29/2004 Document Revised: 04/12/2014 Document Reviewed: 10/20/2013 Elsevier Interactive Patient Education [employer].

## 2015-01-13 DIAGNOSIS — S92355D Nondisplaced fracture of fifth metatarsal bone, left foot, subsequent encounter for fracture with routine healing: Secondary | ICD-10-CM | POA: Diagnosis not present

## 2015-01-14 ENCOUNTER — Ambulatory Visit: Payer: Medicare Other | Admitting: Family Medicine

## 2015-01-15 DIAGNOSIS — D692 Other nonthrombocytopenic purpura: Secondary | ICD-10-CM | POA: Diagnosis not present

## 2015-01-15 DIAGNOSIS — D239 Other benign neoplasm of skin, unspecified: Secondary | ICD-10-CM | POA: Diagnosis not present

## 2015-01-15 DIAGNOSIS — L820 Inflamed seborrheic keratosis: Secondary | ICD-10-CM | POA: Diagnosis not present

## 2015-01-28 DIAGNOSIS — L820 Inflamed seborrheic keratosis: Secondary | ICD-10-CM | POA: Diagnosis not present

## 2015-01-28 DIAGNOSIS — L728 Other follicular cysts of the skin and subcutaneous tissue: Secondary | ICD-10-CM | POA: Diagnosis not present

## 2015-02-14 ENCOUNTER — Other Ambulatory Visit: Payer: Self-pay | Admitting: Orthopedic Surgery

## 2015-02-18 ENCOUNTER — Encounter (HOSPITAL_BASED_OUTPATIENT_CLINIC_OR_DEPARTMENT_OTHER): Payer: Self-pay | Admitting: *Deleted

## 2015-02-18 ENCOUNTER — Encounter (HOSPITAL_BASED_OUTPATIENT_CLINIC_OR_DEPARTMENT_OTHER)
Admission: RE | Admit: 2015-02-18 | Discharge: 2015-02-18 | Disposition: A | Discharge status: Home / Self Care | Payer: Medicare Other | Source: Ambulatory Visit | Attending: Orthopedic Surgery | Admitting: Orthopedic Surgery

## 2015-02-18 DIAGNOSIS — F419 Anxiety disorder, unspecified: Secondary | ICD-10-CM | POA: Diagnosis not present

## 2015-02-18 DIAGNOSIS — F17210 Nicotine dependence, cigarettes, uncomplicated: Secondary | ICD-10-CM | POA: Diagnosis not present

## 2015-02-18 DIAGNOSIS — Z90710 Acquired absence of both cervix and uterus: Secondary | ICD-10-CM | POA: Diagnosis not present

## 2015-02-18 DIAGNOSIS — M899 Disorder of bone, unspecified: Secondary | ICD-10-CM | POA: Diagnosis not present

## 2015-02-18 DIAGNOSIS — M795 Residual foreign body in soft tissue: Secondary | ICD-10-CM | POA: Diagnosis not present

## 2015-02-18 DIAGNOSIS — I739 Peripheral vascular disease, unspecified: Secondary | ICD-10-CM | POA: Diagnosis not present

## 2015-02-18 DIAGNOSIS — M810 Age-related osteoporosis without current pathological fracture: Secondary | ICD-10-CM | POA: Diagnosis not present

## 2015-02-18 DIAGNOSIS — R011 Cardiac murmur, unspecified: Secondary | ICD-10-CM | POA: Diagnosis not present

## 2015-02-18 DIAGNOSIS — I10 Essential (primary) hypertension: Secondary | ICD-10-CM | POA: Diagnosis not present

## 2015-02-18 DIAGNOSIS — J449 Chronic obstructive pulmonary disease, unspecified: Secondary | ICD-10-CM | POA: Diagnosis not present

## 2015-02-18 DIAGNOSIS — F319 Bipolar disorder, unspecified: Secondary | ICD-10-CM | POA: Diagnosis not present

## 2015-02-18 DIAGNOSIS — Z8673 Personal history of transient ischemic attack (TIA), and cerebral infarction without residual deficits: Secondary | ICD-10-CM | POA: Diagnosis not present

## 2015-02-18 DIAGNOSIS — E039 Hypothyroidism, unspecified: Secondary | ICD-10-CM | POA: Diagnosis not present

## 2015-02-18 DIAGNOSIS — Z888 Allergy status to other drugs, medicaments and biological substances status: Secondary | ICD-10-CM | POA: Diagnosis not present

## 2015-02-18 DIAGNOSIS — Z7982 Long term (current) use of aspirin: Secondary | ICD-10-CM | POA: Diagnosis not present

## 2015-02-18 DIAGNOSIS — Z881 Allergy status to other antibiotic agents status: Secondary | ICD-10-CM | POA: Diagnosis not present

## 2015-02-18 DIAGNOSIS — K219 Gastro-esophageal reflux disease without esophagitis: Secondary | ICD-10-CM | POA: Diagnosis not present

## 2015-02-18 DIAGNOSIS — F429 Obsessive-compulsive disorder, unspecified: Secondary | ICD-10-CM | POA: Diagnosis not present

## 2015-02-18 DIAGNOSIS — Z79899 Other long term (current) drug therapy: Secondary | ICD-10-CM | POA: Diagnosis not present

## 2015-02-18 LAB — BASIC METABOLIC PANEL
Anion gap: 9 (ref 5–15)
BUN: 10 mg/dL (ref 6–20)
CO2: 28 mmol/L (ref 22–32)
Calcium: 9.4 mg/dL (ref 8.9–10.3)
Chloride: 103 mmol/L (ref 101–111)
Creatinine, Ser: 0.87 mg/dL (ref 0.44–1.00)
GFR calc Af Amer: >60 mL/min (ref >60)
GFR calc non Af Amer: >60 mL/min (ref >60)
Glucose, Bld: 124 mg/dL — ABNORMAL HIGH (ref 65–99)
Potassium: 4.3 mmol/L (ref 3.5–5.1)
Sodium: 140 mmol/L (ref 135–145)

## 2015-02-19 DIAGNOSIS — F332 Major depressive disorder, recurrent severe without psychotic features: Secondary | ICD-10-CM | POA: Diagnosis not present

## 2015-02-21 ENCOUNTER — Ambulatory Visit (HOSPITAL_BASED_OUTPATIENT_CLINIC_OR_DEPARTMENT_OTHER): Payer: Medicare Other | Admitting: Anesthesiology

## 2015-02-21 ENCOUNTER — Encounter (HOSPITAL_BASED_OUTPATIENT_CLINIC_OR_DEPARTMENT_OTHER): Payer: Self-pay | Admitting: *Deleted

## 2015-02-21 ENCOUNTER — Encounter (HOSPITAL_BASED_OUTPATIENT_CLINIC_OR_DEPARTMENT_OTHER)
Admission: RE | Disposition: A | Discharge status: Home / Self Care | Payer: Self-pay | Source: Ambulatory Visit | Attending: Orthopedic Surgery

## 2015-02-21 ENCOUNTER — Ambulatory Visit (HOSPITAL_BASED_OUTPATIENT_CLINIC_OR_DEPARTMENT_OTHER)
Admission: RE | Admit: 2015-02-21 | Discharge: 2015-02-21 | Disposition: A | Discharge status: Home / Self Care | Payer: Medicare Other | Source: Ambulatory Visit | Attending: Orthopedic Surgery | Admitting: Orthopedic Surgery

## 2015-02-21 DIAGNOSIS — Z79899 Other long term (current) drug therapy: Secondary | ICD-10-CM | POA: Insufficient documentation

## 2015-02-21 DIAGNOSIS — K219 Gastro-esophageal reflux disease without esophagitis: Secondary | ICD-10-CM | POA: Diagnosis not present

## 2015-02-21 DIAGNOSIS — Z888 Allergy status to other drugs, medicaments and biological substances status: Secondary | ICD-10-CM | POA: Insufficient documentation

## 2015-02-21 DIAGNOSIS — F319 Bipolar disorder, unspecified: Secondary | ICD-10-CM | POA: Insufficient documentation

## 2015-02-21 DIAGNOSIS — R011 Cardiac murmur, unspecified: Secondary | ICD-10-CM | POA: Insufficient documentation

## 2015-02-21 DIAGNOSIS — M795 Residual foreign body in soft tissue: Secondary | ICD-10-CM | POA: Diagnosis not present

## 2015-02-21 DIAGNOSIS — Z7982 Long term (current) use of aspirin: Secondary | ICD-10-CM | POA: Insufficient documentation

## 2015-02-21 DIAGNOSIS — F419 Anxiety disorder, unspecified: Secondary | ICD-10-CM | POA: Diagnosis not present

## 2015-02-21 DIAGNOSIS — I10 Essential (primary) hypertension: Secondary | ICD-10-CM | POA: Insufficient documentation

## 2015-02-21 DIAGNOSIS — Z8673 Personal history of transient ischemic attack (TIA), and cerebral infarction without residual deficits: Secondary | ICD-10-CM | POA: Insufficient documentation

## 2015-02-21 DIAGNOSIS — J449 Chronic obstructive pulmonary disease, unspecified: Secondary | ICD-10-CM | POA: Insufficient documentation

## 2015-02-21 DIAGNOSIS — S90851A Superficial foreign body, right foot, initial encounter: Secondary | ICD-10-CM | POA: Diagnosis not present

## 2015-02-21 DIAGNOSIS — I739 Peripheral vascular disease, unspecified: Secondary | ICD-10-CM | POA: Insufficient documentation

## 2015-02-21 DIAGNOSIS — M899 Disorder of bone, unspecified: Secondary | ICD-10-CM | POA: Diagnosis not present

## 2015-02-21 DIAGNOSIS — F17210 Nicotine dependence, cigarettes, uncomplicated: Secondary | ICD-10-CM | POA: Insufficient documentation

## 2015-02-21 DIAGNOSIS — Z881 Allergy status to other antibiotic agents status: Secondary | ICD-10-CM | POA: Insufficient documentation

## 2015-02-21 DIAGNOSIS — F429 Obsessive-compulsive disorder, unspecified: Secondary | ICD-10-CM | POA: Diagnosis not present

## 2015-02-21 DIAGNOSIS — Z90710 Acquired absence of both cervix and uterus: Secondary | ICD-10-CM | POA: Insufficient documentation

## 2015-02-21 DIAGNOSIS — E039 Hypothyroidism, unspecified: Secondary | ICD-10-CM | POA: Insufficient documentation

## 2015-02-21 DIAGNOSIS — M810 Age-related osteoporosis without current pathological fracture: Secondary | ICD-10-CM | POA: Insufficient documentation

## 2015-02-21 DIAGNOSIS — L923 Foreign body granuloma of the skin and subcutaneous tissue: Secondary | ICD-10-CM | POA: Diagnosis not present

## 2015-02-21 HISTORY — PX: FOREIGN BODY REMOVAL: SHX962

## 2015-02-21 HISTORY — DX: Essential (primary) hypertension: I10

## 2015-02-21 SURGERY — REMOVAL FOREIGN BODY EXTREMITY
Anesthesia: General | Site: Foot | Laterality: Right

## 2015-02-21 SURGERY — FOREIGN BODY REMOVAL ADULT
Anesthesia: Choice | Laterality: Right

## 2015-02-21 MED ORDER — FENTANYL CITRATE (PF) 100 MCG/2ML IJ SOLN
25.0000 ug | INTRAMUSCULAR | Status: DC | PRN
Start: 2015-02-21 — End: 2015-02-21

## 2015-02-21 MED ORDER — EPHEDRINE SULFATE 50 MG/ML IJ SOLN
INTRAMUSCULAR | Status: DC | PRN
  Administered 2015-02-21: 10 mg via INTRAVENOUS

## 2015-02-21 MED ORDER — CEFAZOLIN SODIUM-DEXTROSE 2-3 GM-% IV SOLR
2.0000 g | INTRAVENOUS | Status: AC
  Administered 2015-02-21: 2 g via INTRAVENOUS

## 2015-02-21 MED ORDER — FENTANYL CITRATE (PF) 100 MCG/2ML IJ SOLN
INTRAMUSCULAR | Status: AC
  Filled 2015-02-21: qty 2

## 2015-02-21 MED ORDER — LIDOCAINE HCL (CARDIAC) 20 MG/ML IV SOLN
INTRAVENOUS | Status: DC | PRN
  Administered 2015-02-21: 50 mg via INTRAVENOUS

## 2015-02-21 MED ORDER — DEXAMETHASONE SODIUM PHOSPHATE 10 MG/ML IJ SOLN
INTRAMUSCULAR | Status: AC
  Filled 2015-02-21: qty 1

## 2015-02-21 MED ORDER — GLYCOPYRROLATE 0.2 MG/ML IJ SOLN: 0.2000 mg | Freq: Once | INTRAMUSCULAR | Status: DC | PRN

## 2015-02-21 MED ORDER — BUPIVACAINE HCL (PF) 0.25 % IJ SOLN
INTRAMUSCULAR | Status: AC
  Filled 2015-02-21: qty 30

## 2015-02-21 MED ORDER — ONDANSETRON HCL 4 MG/2ML IJ SOLN
INTRAMUSCULAR | Status: AC
  Filled 2015-02-21: qty 2

## 2015-02-21 MED ORDER — BUPIVACAINE HCL 0.25 % IJ SOLN
INTRAMUSCULAR | Status: DC | PRN
  Administered 2015-02-21: 10 mL

## 2015-02-21 MED ORDER — LIDOCAINE HCL (CARDIAC) 20 MG/ML IV SOLN
INTRAVENOUS | Status: AC
  Filled 2015-02-21: qty 5

## 2015-02-21 MED ORDER — PROMETHAZINE HCL 25 MG/ML IJ SOLN
INTRAMUSCULAR | Status: AC
  Filled 2015-02-21: qty 1

## 2015-02-21 MED ORDER — ONDANSETRON HCL 4 MG/2ML IJ SOLN
INTRAMUSCULAR | Status: DC | PRN
  Administered 2015-02-21: 4 mg via INTRAVENOUS

## 2015-02-21 MED ORDER — PROMETHAZINE HCL 25 MG/ML IJ SOLN
6.2500 mg | Freq: Once | INTRAMUSCULAR | Status: AC
  Administered 2015-02-21: 6.25 mg via INTRAVENOUS

## 2015-02-21 MED ORDER — PROPOFOL 10 MG/ML IV BOLUS
INTRAVENOUS | Status: DC | PRN
  Administered 2015-02-21: 100 mg via INTRAVENOUS

## 2015-02-21 MED ORDER — DEXAMETHASONE SODIUM PHOSPHATE 4 MG/ML IJ SOLN
INTRAMUSCULAR | Status: DC | PRN
  Administered 2015-02-21: 10 mg via INTRAVENOUS

## 2015-02-21 MED ORDER — SCOPOLAMINE 1 MG/3DAYS TD PT72: 1.0000 | MEDICATED_PATCH | Freq: Once | TRANSDERMAL | Status: DC | PRN

## 2015-02-21 MED ORDER — MIDAZOLAM HCL 2 MG/2ML IJ SOLN: 1.0000 mg | INTRAMUSCULAR | Status: DC | PRN

## 2015-02-21 MED ORDER — TRAMADOL HCL 50 MG PO TABS: 50.0000 mg | ORAL_TABLET | Freq: Four times a day (QID) | ORAL | Status: DC | PRN

## 2015-02-21 MED ORDER — LACTATED RINGERS IV SOLN
INTRAVENOUS | Status: DC
  Administered 2015-02-21: 11:00:00 via INTRAVENOUS

## 2015-02-21 MED ORDER — CEFAZOLIN SODIUM-DEXTROSE 2-3 GM-% IV SOLR
INTRAVENOUS | Status: AC
  Filled 2015-02-21: qty 50

## 2015-02-21 MED ORDER — FENTANYL CITRATE (PF) 100 MCG/2ML IJ SOLN
50.0000 ug | INTRAMUSCULAR | Status: DC | PRN
  Administered 2015-02-21: 25 ug via INTRAVENOUS
  Administered 2015-02-21: 50 ug via INTRAVENOUS

## 2015-02-21 SURGICAL SUPPLY — 65 items
BANDAGE ELASTIC 4 VELCRO ST LF (GAUZE/BANDAGES/DRESSINGS) ×3 IMPLANT
BANDAGE ELASTIC 6 VELCRO ST LF (GAUZE/BANDAGES/DRESSINGS) IMPLANT
BANDAGE ESMARK 6X9 LF (GAUZE/BANDAGES/DRESSINGS) ×1 IMPLANT
BLADE SURG 15 STRL LF DISP TIS (BLADE) ×1 IMPLANT
BLADE SURG 15 STRL SS (BLADE) ×2
BNDG COHESIVE 4X5 TAN STRL (GAUZE/BANDAGES/DRESSINGS) ×3 IMPLANT
BNDG ESMARK 6X9 LF (GAUZE/BANDAGES/DRESSINGS) ×3
BRUSH SCRUB EZ PLAIN DRY (MISCELLANEOUS) ×3 IMPLANT
CANISTER SUCT 1200ML W/VALVE (MISCELLANEOUS) ×3 IMPLANT
CLOSURE STERI-STRIP 1/2X4 (GAUZE/BANDAGES/DRESSINGS)
CLSR STERI-STRIP ANTIMIC 1/2X4 (GAUZE/BANDAGES/DRESSINGS) IMPLANT
COVER BACK TABLE 60X90IN (DRAPES) ×3 IMPLANT
CUFF TOURNIQUET SINGLE 18IN (TOURNIQUET CUFF) ×3 IMPLANT
CUFF TOURNIQUET SINGLE 34IN LL (TOURNIQUET CUFF) IMPLANT
DECANTER SPIKE VIAL GLASS SM (MISCELLANEOUS) IMPLANT
DRAPE EXTREMITY T 121X128X90 (DRAPE) ×3 IMPLANT
DRAPE INCISE IOBAN 66X45 STRL (DRAPES) IMPLANT
DRAPE OEC MINIVIEW 54X84 (DRAPES) ×3 IMPLANT
DRAPE U 20/CS (DRAPES) ×3 IMPLANT
DRAPE U-SHAPE 47X51 STRL (DRAPES) ×3 IMPLANT
DRSG PAD ABDOMINAL 8X10 ST (GAUZE/BANDAGES/DRESSINGS) IMPLANT
DURAPREP 26ML APPLICATOR (WOUND CARE) IMPLANT
ELECT REM PT RETURN 9FT ADLT (ELECTROSURGICAL) ×3
ELECTRODE REM PT RTRN 9FT ADLT (ELECTROSURGICAL) ×1 IMPLANT
GAUZE SPONGE 4X4 12PLY STRL (GAUZE/BANDAGES/DRESSINGS) ×3 IMPLANT
GLOVE BIO SURGEON STRL SZ 6.5 (GLOVE) ×4 IMPLANT
GLOVE BIO SURGEON STRL SZ8 (GLOVE) ×3 IMPLANT
GLOVE BIO SURGEON STRL SZ8.5 (GLOVE) ×3 IMPLANT
GLOVE BIO SURGEONS STRL SZ 6.5 (GLOVE) ×2
GLOVE BIOGEL PI IND STRL 7.0 (GLOVE) ×1 IMPLANT
GLOVE BIOGEL PI IND STRL 8 (GLOVE) ×2 IMPLANT
GLOVE BIOGEL PI INDICATOR 7.0 (GLOVE) ×2
GLOVE BIOGEL PI INDICATOR 8 (GLOVE) ×4
GLOVE ORTHO TXT STRL SZ7.5 (GLOVE) ×3 IMPLANT
GOWN STRL REUS W/ TWL LRG LVL3 (GOWN DISPOSABLE) ×1 IMPLANT
GOWN STRL REUS W/ TWL XL LVL3 (GOWN DISPOSABLE) ×2 IMPLANT
GOWN STRL REUS W/TWL LRG LVL3 (GOWN DISPOSABLE) ×2
GOWN STRL REUS W/TWL XL LVL3 (GOWN DISPOSABLE) ×4
HANDPIECE INTERPULSE COAX TIP (DISPOSABLE)
NEEDLE HYPO 25X1 1.5 SAFETY (NEEDLE) IMPLANT
NS IRRIG 1000ML POUR BTL (IV SOLUTION) ×3 IMPLANT
PACK BASIN DAY SURGERY FS (CUSTOM PROCEDURE TRAY) ×3 IMPLANT
PAD CAST 4YDX4 CTTN HI CHSV (CAST SUPPLIES) ×1 IMPLANT
PADDING CAST COTTON 4X4 STRL (CAST SUPPLIES) ×2
PENCIL BUTTON HOLSTER BLD 10FT (ELECTRODE) ×3 IMPLANT
SET HNDPC FAN SPRY TIP SCT (DISPOSABLE) IMPLANT
SLEEVE SCD COMPRESS KNEE MED (MISCELLANEOUS) IMPLANT
SPLINT FAST PLASTER 5X30 (CAST SUPPLIES)
SPLINT PLASTER CAST FAST 5X30 (CAST SUPPLIES) IMPLANT
SPONGE LAP 18X18 X RAY DECT (DISPOSABLE) IMPLANT
SPONGE LAP 4X18 X RAY DECT (DISPOSABLE) IMPLANT
STAPLER VISISTAT 35W (STAPLE) IMPLANT
SUT ETHILON 3 0 PS 1 (SUTURE) IMPLANT
SUT ETHILON 4 0 PS 2 18 (SUTURE) IMPLANT
SUT MNCRL AB 4-0 PS2 18 (SUTURE) IMPLANT
SUT VIC AB 0 CT1 27 (SUTURE)
SUT VIC AB 0 CT1 27XBRD ANBCTR (SUTURE) IMPLANT
SUT VIC AB 2-0 SH 18 (SUTURE) IMPLANT
SUT VIC AB 3-0 SH 27 (SUTURE) ×2
SUT VIC AB 3-0 SH 27X BRD (SUTURE) ×1 IMPLANT
SUT VICRYL 3-0 CR8 SH (SUTURE) IMPLANT
SUT VICRYL 4-0 PS2 18IN ABS (SUTURE) IMPLANT
SWAB COLLECTION DEVICE MRSA (MISCELLANEOUS) IMPLANT
SWAB CULTURE ESWAB REG 1ML (MISCELLANEOUS) IMPLANT
SYR BULB 3OZ (MISCELLANEOUS) ×3 IMPLANT

## 2015-02-21 NOTE — Transfer of Care (Signed)
Immediate Anesthesia Transfer of Care Note  Patient: [name]  Procedure(s) Performed: Procedure(s): RIGHT FOOT FOREIGN BODY REMOVAL  (Right)  Patient Location: PACU  Anesthesia Type:General  Level of Consciousness: awake and sedated  Airway & Oxygen Therapy: Patient Spontanous Breathing and Patient connected to face mask oxygen  Post-op Assessment: Report given to RN and Post -op Vital signs reviewed and stable  Post vital signs: Reviewed and stable  Last Vitals:  Filed Vitals:   02/21/15 1031  BP: 114/77  Pulse: 88  Temp: 36.7 C  Resp: 20    Complications: No apparent anesthesia complications

## 2015-02-21 NOTE — Anesthesia Procedure Notes (Signed)
Procedure Name: LMA Insertion Performed by: Terrance Mass Pre-anesthesia Checklist: Patient identified, Emergency Drugs available, Suction available and Patient being monitored Patient Re-evaluated:Patient Re-evaluated prior to inductionOxygen Delivery Method: Circle System Utilized Preoxygenation: Pre-oxygenation with 100% oxygen Intubation Type: IV induction Ventilation: Mask ventilation without difficulty LMA: LMA inserted LMA Size: 3.0 Number of attempts: 2 Airway Equipment and Method: bite block Placement Confirmation: positive ETCO2 Tube secured with: Tape Dental Injury: Teeth and Oropharynx as per pre-operative assessment

## 2015-02-21 NOTE — Op Note (Signed)
02/21/2015  12:21 PM  PATIENT:  [name]    PRE-OPERATIVE DIAGNOSIS:  RIGHT FOOT FOREIGN BODY with lateral exostosis of the metatarsal head  POST-OPERATIVE DIAGNOSIS:  Same, with probable neuroma  PROCEDURE:  RIGHT FOOT FOREIGN BODY REMOVAL with exostectomy of the lateral metatarsal head and neck, with neurectomy of the fifth digital nerve.   SURGEON:  Johnny Bridge, MD  PHYSICIAN ASSISTANT: Joya Gaskins, OPA-C, present and scrubbed throughout the case, critical for completion in a timely fashion, and for retraction, instrumentation, and closure.  ANESTHESIA:   General  PREOPERATIVE INDICATIONS:  [name] is a  69 y.o. female who has had persistent right lateral foot pain with a possible foreign body identified on plain x-ray. She elected for surgical management, although in the immediate preoperative area she was not able to tell me exactly where she could feel the foreign body, but had pain localized in that general region. There was a palpable exostosis, as well as lateral callus, and she elected for surgical management.  The risks benefits and alternatives were discussed with the patient preoperatively including but not limited to the risks of infection, bleeding, nerve injury, cardiopulmonary complications, the need for revision surgery, among others, and the patient was willing to proceed. We also discussed the risks for incomplete relief of symptoms, persistent pain, inability to identify foreign body, among others.  OPERATIVE IMPLANTS: None  OPERATIVE FINDINGS: I am not totally convinced that I actually found a foreign body, but it may be that there was a substantial neuroma, and soft tissue calcification around the lateral aspect of the foot, there was certainly a exostosis and hypertrophic region along the metatarsal neck which I excised the prominence of. I suspect this was contributing to her callus.  OPERATIVE PROCEDURE: The patient is brought to the operating room  and placed in supine position. Gen. anesthesia was administered. IV antibiotics were given. The right lower extremity is prepped and draped in usual sterile fashion. Time out performed. A C-arm was used to identify the location where the supposed foreign body was located. I made an incision over the lateral border of the foot dissected down. There was a bursal hypertrophy and thickening of the soft tissue which I dissected free. The digital nerve appeared somewhat enlarged, and was encapsulated within the bursal tissue. This was excised. I also performed an exostosis with a rongeur off of the lateral border of fifth metatarsal neck. I used C-arm, but it was extremely difficult to identify any type of foreign body, but the radio opaque appearance that was on the preoperative x-ray was not visible, and I irrigated the wound copiously, and then repaired with Vicryl followed by Steri-Strips and sterile gauze. She is placed in a postop shoe. She tolerated this well and there were no complications. Hopefully this improves her symptoms.
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
## 2015-02-21 NOTE — Anesthesia Preprocedure Evaluation (Addendum)
Anesthesia Evaluation  Patient identified by MRN, date of birth, ID band Patient awake    Reviewed: Allergy & Precautions, H&P , NPO status , Patient's Chart, lab work & pertinent test results  Airway Mallampati: II  TM Distance: >3 FB Neck ROM: Full    Dental no notable dental hx. (+) Teeth Intact, Dental Advisory Given   Pulmonary COPD,  COPD inhaler, Current Smoker,    Pulmonary exam normal breath sounds clear to auscultation       Cardiovascular hypertension, Pt. on medications + Peripheral Vascular Disease   Rhythm:Regular Rate:Normal     Neuro/Psych PSYCHIATRIC DISORDERS Anxiety Depression Bipolar Disorder CVA    GI/Hepatic Neg liver ROS, PUD, GERD  Controlled,  Endo/Other  Hypothyroidism   Renal/GU negative Renal ROS  negative genitourinary   Musculoskeletal   Abdominal   Peds  Hematology negative hematology ROS (+)   Anesthesia Other Findings   Reproductive/Obstetrics negative OB ROS                            Anesthesia Physical Anesthesia Plan  ASA: III  Anesthesia Plan: General   Post-op Pain Management:    Induction: Intravenous  Airway Management Planned: LMA  Additional Equipment:   Intra-op Plan:   Post-operative Plan: Extubation in OR  Informed Consent: I have reviewed the patients History and Physical, chart, labs and discussed the procedure including the risks, benefits and alternatives for the proposed anesthesia with the patient or authorized representative who has indicated his/her understanding and acceptance.   Dental advisory given  Plan Discussed with: CRNA  Anesthesia Plan Comments:         Anesthesia Quick Evaluation

## 2015-02-21 NOTE — Discharge Instructions (Signed)
Diet: As you were doing prior to hospitalization   Shower:  May shower but keep the wounds dry, use an occlusive plastic wrap, NO SOAKING IN TUB.  If the bandage gets wet, change with a clean dry gauze.  If you have a splint on, leave the splint in place and keep the splint dry with a plastic bag.  Dressing:  You may change your dressing 3-5 days after surgery, unless you have a splint.  If you have a splint, then just leave the splint in place and we will change your bandages during your first follow-up appointment.    If you had hand or foot surgery, we will plan to remove your stitches in about 2 weeks in the office.  For all other surgeries, there are sticky tapes (steri-strips) on your wounds and all the stitches are absorbable.  Leave the steri-strips in place when changing your dressings, they will peel off with time, usually 2-3 weeks.  Activity:  Increase activity slowly as tolerated, but follow the weight bearing instructions below.  The rules on driving is that you can not be taking narcotics while you drive, and you must feel in control of the vehicle.    Weight Bearing:   Ok to put weight on the foot using the post-op shoe..    To prevent constipation: you may use a stool softener such as -  Colace (over the counter) 100 mg by mouth twice a day  Drink plenty of fluids (prune juice may be helpful) and high fiber foods Miralax (over the counter) for constipation as needed.    Itching:  If you experience itching with your medications, try taking only a single pain pill, or even half a pain pill at a time.  You may take up to 10 pain pills per day, and you can also use benadryl over the counter for itching or also to help with sleep.   Precautions:  If you experience chest pain or shortness of breath - call 911 immediately for transfer to the hospital emergency department!!  If you develop a fever greater that 101 F, purulent drainage from wound, increased redness or drainage from  wound, or calf pain -- Call the office at [phone]                                                Follow- Up Appointment:  Please call for an appointment to be seen in 2 weeks [place] - [phone]      Post Anesthesia Home Care Instructions  Activity: Get plenty of rest for the remainder of the day. A responsible adult should stay with you for 24 hours following the procedure.  For the next 24 hours, DO NOT: -Drive a car -Paediatric nurse -Drink alcoholic beverages -Take any medication unless instructed by your physician -Make any legal decisions or sign important papers.  Meals: Start with liquid foods such as gelatin or soup. Progress to regular foods as tolerated. Avoid greasy, spicy, heavy foods. If nausea and/or vomiting occur, drink only clear liquids until the nausea and/or vomiting subsides. Call your physician if vomiting continues.  Special Instructions/Symptoms: Your throat may feel dry or sore from the anesthesia or the breathing tube placed in your throat during surgery. If this causes discomfort, gargle with warm salt water. The discomfort should disappear within 24 hours.  If you had a  scopolamine patch placed behind your ear for the management of post- operative nausea and/or vomiting:  1. The medication in the patch is effective for 72 hours, after which it should be removed.  Wrap patch in a tissue and discard in the trash. Wash hands thoroughly with soap and water. 2. You may remove the patch earlier than 72 hours if you experience unpleasant side effects which may include dry mouth, dizziness or visual disturbances. 3. Avoid touching the patch. Wash your hands with soap and water after contact with the patch.

## 2015-02-21 NOTE — Anesthesia Postprocedure Evaluation (Signed)
  Anesthesia Post-op Note  Patient: [name]  Procedure(s) Performed: Procedure(s): RIGHT FOOT FOREIGN BODY REMOVAL  (Right)  Patient Location: PACU  Anesthesia Type:General  Level of Consciousness: awake and alert   Airway and Oxygen Therapy: Patient Spontanous Breathing  Post-op Pain: Controlled  Post-op Assessment: Post-op Vital signs reviewed, Patient's Cardiovascular Status Stable and Respiratory Function Stable  Post-op Vital Signs: Reviewed  Filed Vitals:   02/21/15 1315  BP: 120/68  Pulse: 91  Temp:   Resp: 20    Complications: No apparent anesthesia complications

## 2015-02-24 ENCOUNTER — Encounter (HOSPITAL_BASED_OUTPATIENT_CLINIC_OR_DEPARTMENT_OTHER): Payer: Self-pay | Admitting: Orthopedic Surgery

## 2015-02-24 ENCOUNTER — Emergency Department (INDEPENDENT_AMBULATORY_CARE_PROVIDER_SITE_OTHER)
Admission: EM | Admit: 2015-02-24 | Discharge: 2015-02-24 | Disposition: A | Discharge status: Home / Self Care | Payer: Medicare Other | Source: Home / Self Care | Attending: Family Medicine | Admitting: Family Medicine

## 2015-02-24 DIAGNOSIS — J439 Emphysema, unspecified: Secondary | ICD-10-CM | POA: Diagnosis not present

## 2015-02-24 DIAGNOSIS — J069 Acute upper respiratory infection, unspecified: Secondary | ICD-10-CM

## 2015-02-24 DIAGNOSIS — R079 Chest pain, unspecified: Secondary | ICD-10-CM

## 2015-02-24 DIAGNOSIS — J9801 Acute bronchospasm: Secondary | ICD-10-CM

## 2015-02-24 MED ORDER — ALBUTEROL SULFATE (2.5 MG/3ML) 0.083% IN NEBU
2.5000 mg | INHALATION_SOLUTION | Freq: Once | RESPIRATORY_TRACT | Status: AC
  Administered 2015-02-24: 2.5 mg via RESPIRATORY_TRACT

## 2015-02-24 MED ORDER — IPRATROPIUM-ALBUTEROL 0.5-2.5 (3) MG/3ML IN SOLN
RESPIRATORY_TRACT | Status: AC
  Filled 2015-02-24: qty 3

## 2015-02-24 MED ORDER — ALBUTEROL SULFATE (2.5 MG/3ML) 0.083% IN NEBU
INHALATION_SOLUTION | RESPIRATORY_TRACT | Status: AC
  Filled 2015-02-24: qty 3

## 2015-02-24 MED ORDER — IPRATROPIUM-ALBUTEROL 0.5-2.5 (3) MG/3ML IN SOLN
3.0000 mL | Freq: Once | RESPIRATORY_TRACT | Status: AC
  Administered 2015-02-24: 3 mL via RESPIRATORY_TRACT

## 2015-02-24 NOTE — Discharge Instructions (Signed)
Upper Respiratory Infection, Adult 2 your head cold recommend using Allegra or Claritin for drainage. Since you have a heart condition do not recommend using decongestions. Drink plenty of fluids and stay well-hydrated. Use her albuterol HFA 2 puffs every 4 hours as needed for wheezing and cough. Follow-up with your primary care doctor this week, call for an appointment tomorrow. As your being discharged you brought up a new complaint of chest pressure. You have been advised that chest pressure can be a sign of heart problems. The provider recommended you have an EKG and possibly additional testing based on findings and other tests. You have declined to have an EKG or additional testing and requesting to be discharged now. She get worse, have more chest pain, vomiting, increased weakness, passing out, sweating call EMS or go directly to the emergency department. Most upper respiratory infections (URIs) are a viral infection of the air passages leading to the lungs. A URI affects the nose, throat, and upper air passages. The most common type of URI is nasopharyngitis and is typically referred to as "the common cold." URIs run their course and usually go away on their own. Most of the time, a URI does not require medical attention, but sometimes a bacterial infection in the upper airways can follow a viral infection. This is called a secondary infection. Sinus and middle ear infections are common types of secondary upper respiratory infections. Bacterial pneumonia can also complicate a URI. A URI can worsen asthma and chronic obstructive pulmonary disease (COPD). Sometimes, these complications can require emergency medical care and may be life threatening.  CAUSES Almost all URIs are caused by viruses. A virus is a type of germ and can spread from one person to another.  RISKS FACTORS You may be at risk for a URI if:   You smoke.   You have chronic heart or lung disease.  You have a weakened defense  (immune) system.   You are very young or very old.   You have nasal allergies or asthma.  You work in crowded or poorly ventilated areas.  You work in health care facilities or schools. SIGNS AND SYMPTOMS  Symptoms typically develop 2-3 days after you come in contact with a cold virus. Most viral URIs last 7-10 days. However, viral URIs from the influenza virus (flu virus) can last 14-18 days and are typically more severe. Symptoms may include:   Runny or stuffy (congested) nose.   Sneezing.   Cough.   Sore throat.   Headache.   Fatigue.   Fever.   Loss of appetite.   Pain in your forehead, behind your eyes, and over your cheekbones (sinus pain).  Muscle aches.  DIAGNOSIS  Your health care provider may diagnose a URI by:  Physical exam.  Tests to check that your symptoms are not due to another condition such as:  Strep throat.  Sinusitis.  Pneumonia.  Asthma. TREATMENT  A URI goes away on its own with time. It cannot be cured with medicines, but medicines may be prescribed or recommended to relieve symptoms. Medicines may help:  Reduce your fever.  Reduce your cough.  Relieve nasal congestion. HOME CARE INSTRUCTIONS   Take medicines only as directed by your health care provider.   Gargle warm saltwater or take cough drops to comfort your throat as directed by your health care provider.  Use a warm mist humidifier or inhale steam from a shower to increase air moisture. This may make it easier to breathe.  Drink enough fluid to keep your urine clear or pale yellow.   Eat soups and other clear broths and maintain good nutrition.   Rest as needed.   Return to work when your temperature has returned to normal or as your health care provider advises. You may need to stay home longer to avoid infecting others. You can also use a face mask and careful hand washing to prevent spread of the virus.  Increase the usage of your inhaler if you  have asthma.   Do not use any tobacco products, including cigarettes, chewing tobacco, or electronic cigarettes. If you need help quitting, ask your health care provider. PREVENTION  The best way to protect yourself from getting a cold is to practice good hygiene.   Avoid oral or hand contact with people with cold symptoms.   Wash your hands often if contact occurs.  There is no clear evidence that vitamin C, vitamin E, echinacea, or exercise reduces the chance of developing a cold. However, it is always recommended to get plenty of rest, exercise, and practice good nutrition.  SEEK MEDICAL CARE IF:   You are getting worse rather than better.   Your symptoms are not controlled by medicine.   You have chills.  You have worsening shortness of breath.  You have brown or red mucus.  You have yellow or brown nasal discharge.  You have pain in your face, especially when you bend forward.  You have a fever.  You have swollen neck glands.  You have pain while swallowing.  You have white areas in the back of your throat. SEEK IMMEDIATE MEDICAL CARE IF:   You have severe or persistent:  Headache.  Ear pain.  Sinus pain.  Chest pain.  You have chronic lung disease and any of the following:  Wheezing.  Prolonged cough.  Coughing up blood.  A change in your usual mucus.  You have a stiff neck.  You have changes in your:  Vision.  Hearing.  Thinking.  Mood. MAKE SURE YOU:   Understand these instructions.  Will watch your condition.  Will get help right away if you are not doing well or get worse.   This information is not intended to replace advice given to you by your health care provider. Make sure you discuss any questions you have with your health care provider.   Document Released: 09/15/2000 Document Revised: 08/06/2014 Document Reviewed: 06/27/2013 Elsevier Interactive Patient Education 2016 [employer]  Bronchospasm, Adult A  bronchospasm is a spasm or tightening of the airways going into the lungs. During a bronchospasm breathing becomes more difficult because the airways get smaller. When this happens there can be coughing, a whistling sound when breathing (wheezing), and difficulty breathing. Bronchospasm is often associated with asthma, but not all patients who experience a bronchospasm have asthma. CAUSES  A bronchospasm is caused by inflammation or irritation of the airways. The inflammation or irritation may be triggered by:   Allergies (such as to animals, pollen, food, or mold). Allergens that cause bronchospasm may cause wheezing immediately after exposure or many hours later.   Infection. Viral infections are believed to be the most common cause of bronchospasm.   Exercise.   Irritants (such as pollution, cigarette smoke, strong odors, aerosol sprays, and paint fumes).   Weather changes. Winds increase molds and pollens in the air. Rain refreshes the air by washing irritants out. Cold air may cause inflammation.   Stress and emotional upset.  SIGNS AND SYMPTOMS  Wheezing.   Excessive nighttime coughing.   Frequent or severe coughing with a simple cold.   Chest tightness.   Shortness of breath.  DIAGNOSIS  Bronchospasm is usually diagnosed through a history and physical exam. Tests, such as chest X-rays, are sometimes done to look for other conditions. TREATMENT   Inhaled medicines can be given to open up your airways and help you breathe. The medicines can be given using either an inhaler or a nebulizer machine.  Corticosteroid medicines may be given for severe bronchospasm, usually when it is associated with asthma. HOME CARE INSTRUCTIONS   Always have a plan prepared for seeking medical care. Know when to call your health care provider and local emergency services (911 in the [place] Know where you can access local emergency care.  Only take medicines as directed by your health  care provider.  If you were prescribed an inhaler or nebulizer machine, ask your health care provider to explain how to use it correctly. Always use a spacer with your inhaler if you were given one.  It is necessary to remain calm during an attack. Try to relax and breathe more slowly.  Control your home environment in the following ways:   Change your heating and air conditioning filter at least once a month.   Limit your use of fireplaces and wood stoves.  Do not smoke and do not allow smoking in your home.   Avoid exposure to perfumes and fragrances.   Get rid of pests (such as roaches and mice) and their droppings.   Throw away plants if you see mold on them.   Keep your house clean and dust free.   Replace carpet with wood, tile, or vinyl flooring. Carpet can trap dander and dust.   Use allergy-proof pillows, mattress covers, and box spring covers.   Wash bed sheets and blankets every week in hot water and dry them in a dryer.   Use blankets that are made of polyester or cotton.   Wash hands frequently. SEEK MEDICAL CARE IF:   You have muscle aches.   You have chest pain.   The sputum changes from clear or white to yellow, green, gray, or bloody.   The sputum you cough up gets thicker.   There are problems that may be related to the medicine you are given, such as a rash, itching, swelling, or trouble breathing.  SEEK IMMEDIATE MEDICAL CARE IF:   You have worsening wheezing and coughing even after taking your prescribed medicines.   You have increased difficulty breathing.   You develop severe chest pain. MAKE SURE YOU:   Understand these instructions.  Will watch your condition.  Will get help right away if you are not doing well or get worse.   This information is not intended to replace advice given to you by your health care provider. Make sure you discuss any questions you have with your health care provider.   Document Released:  03/25/2003 Document Revised: 04/12/2014 Document Reviewed: 09/11/2012 Elsevier Interactive Patient Education 2016 [employer]  Chronic Obstructive Pulmonary Disease Exacerbation Chronic obstructive pulmonary disease (COPD) is a common lung problem. In COPD, the flow of air from the lungs is limited. COPD exacerbations are times that breathing gets worse and you need extra treatment. Without treatment they can be life threatening. If they happen often, your lungs can become more damaged. If your COPD gets worse, your doctor may treat you with:  Medicines.  Oxygen.  Different ways to clear your  airway, such as using a mask. HOME CARE  Do not smoke.  Avoid tobacco smoke and other things that bother your lungs.  If given, take your antibiotic medicine as told. Finish the medicine even if you start to feel better.  Only take medicines as told by your doctor.  Drink enough fluids to keep your pee (urine) clear or pale yellow (unless your doctor has told you not to).  Use a cool mist machine (vaporizer).  If you use oxygen or a machine that turns liquid medicine into a mist (nebulizer), continue to use them as told.  Keep up with shots (vaccinations) as told by your doctor.  Exercise regularly.  Eat healthy foods.  Keep all doctor visits as told. GET HELP RIGHT AWAY IF:  You are very short of breath and it gets worse.  You have trouble talking.  You have bad chest pain.  You have blood in your spit (sputum).  You have a fever.  You keep throwing up (vomiting).  You feel weak, or you pass out (faint).  You feel confused.  You keep getting worse. MAKE SURE YOU:  Understand these instructions.  Will watch your condition.  Will get help right away if you are not doing well or get worse.   This information is not intended to replace advice given to you by your health care provider. Make sure you discuss any questions you have with your health care provider.     Document Released: 03/11/2011 Document Revised: 04/12/2014 Document Reviewed: 11/24/2012 Elsevier Interactive Patient Education [employer].

## 2015-02-24 NOTE — ED Provider Notes (Signed)
CSN: [id number]     Arrival date & time 02/24/15  1439 History   First MD Initiated Contact with Patient 02/24/15 1546     Chief Complaint  Patient presents with  . URI  . Cough   (Consider location/radiation/quality/duration/timing/severity/associated sxs/prior Treatment) HPI Comments: 69 year old female presents with a complaint of a "head cold" for 2 days. She has had a decreased appetite, general aches and pains, runny nose, PND, cough, sneezing and mild sore throat. Denies earache. Denies known fevers. She has a history of smoking and COPD. She does have an albuterol HFA but has not been using it.  After the patient had been administered her DuoNeb she was being reevaluated by the provider. She had placed several layers were closed back on. When I asked how she was breathing she states that she does not know she feels any better but the pressure in her chest remains. This is the first complaint or mention of pressure discomfort she did not mention this in the initial history of present illness.   Past Medical History  Diagnosis Date  . OCD (obsessive compulsive disorder)   . Depression   . Anxiety   . PUD (peptic ulcer disease) 09/2006    H pylori  . Diverticulosis of colon   . GERD (gastroesophageal reflux disease)   . COPD (chronic obstructive pulmonary disease) (HCC)     Due to long history of tobacco abuse, still smoking  . History of uterine prolapse 10/2004    Dr Cletis Media  . Thyroid disease     Ho R thyroid noduel plus mild hyperthyroidism. Treated with radioiodine therapty on 04/2006. Dr Estrella Myrtle.  [name] Heart murmur   . Osteoporosis   . Hypertension    Past Surgical History  Procedure Laterality Date  . Colonoscopy  06/03/2005    Hyperplastic polyps, sigmoid diverticulosis, internal hemorroids  . Ganglion removal  12/2005    R wrist subretinacular dorsal ganglion  . Biopsy thyroid  08/2008    Atypia and Hurtle cells, partial thyroidectomy   . Tonsillectomy and adenoidectomy   10/2004  . Pft  11/2006    Obstructive pattern.  Poor response to bronchodilators.   . Thyroidectomy, partial  08/2008    Dr Ronnald Collum  . Transthoracic echocardiogram  123456    Normal systolic function.  EF 55-60%.  Mild MR, atria ok, trivial pericardial effusion  . Carotid ultrasound  09/10/09    No significant extracranial carotid artery stenosis, vertebrals are patent with antegrade flow.   . Mri head  09/20/09    No acute intracranial abn.  Signal abnormality suggestive of chronic small vessel ischemia, maximal in the right subinsular white and deep gray mater.   . Tvt  06/15/10    Tension-free Vaginal Tape, Dr Mancel Bale, for urge incontinence  . Abdominal hysterectomy    . Bladder suspension      mesh sling  . Foreign body removal Right 02/21/2015    Procedure: RIGHT FOOT FOREIGN BODY REMOVAL ;  Surgeon: Marchia Bond, MD;  Location: [facility];  Service: Orthopedics;  Laterality: Right;   Family History  Problem Relation Age of Onset  . Heart disease Mother   . Heart disease Maternal Grandmother   . Heart disease Maternal Grandfather   . Cirrhosis Sister   . Hypertension Sister   . Other Neg Hx    Social History  Substance Use Topics  . Smoking status: Current Every Day Smoker -- 0.25 packs/day for 35 years    Types:  Cigarettes  . Smokeless tobacco: Never Used     Comment: smokes less than 1 pack per week. ready to quitt.  . Alcohol Use: No   OB History    Gravida Para Term Preterm AB TAB SAB Ectopic Multiple Living   2         2     Review of Systems  Constitutional: Positive for activity change, appetite change and fatigue. Negative for fever and chills.  HENT: Positive for congestion, postnasal drip, rhinorrhea, sneezing and sore throat. Negative for facial swelling.   Eyes: Negative.   Respiratory: Positive for cough. Negative for choking.   Cardiovascular: Negative.  Negative for chest pain and leg swelling.  Gastrointestinal: Negative.    Musculoskeletal: Positive for myalgias. Negative for neck pain and neck stiffness.  Skin: Negative for pallor and rash.  Neurological: Negative.     Allergies  Seroquel; Amoxicillin; Ciprofloxacin; Haloperidol lactate; Ketorolac tromethamine; Latuda; Thorazine; and Wellbutrin  Home Medications   Prior to Admission medications   Medication Sig Start Date End Date Taking? Authorizing Provider  albuterol (PROVENTIL HFA;VENTOLIN HFA) 108 (90 BASE) MCG/ACT inhaler Inhale 2 puffs into the lungs every 6 (six) hours as needed for wheezing or shortness of breath. 06/26/14   Timmothy Euler, MD  ARIPiprazole (ABILIFY) 2 MG tablet Take 2 mg by mouth daily.  11/21/13   Historical Provider, MD  aspirin 325 MG tablet Take 162.5-325 mg by mouth daily as needed for mild pain, moderate pain or headache.     Historical Provider, MD  atorvastatin (LIPITOR) 40 MG tablet Take 1 tablet (40 mg total) by mouth daily. 06/15/14   Frazier Richards, MD  COD LIVER OIL PO Take by mouth.    Historical Provider, MD  fluticasone-salmeterol (ADVAIR HFA) 230-21 MCG/ACT inhaler Inhale 2 puffs into the lungs 2 (two) times daily. 06/26/14   Timmothy Euler, MD  folic acid (FOLVITE) A999333 MCG tablet Take 400 mcg by mouth daily.    Historical Provider, MD  levothyroxine (SYNTHROID, LEVOTHROID) 75 MCG tablet TAKE 1 TABLET BY MOUTH DAILY FOR THYROID 09/04/14   Frazier Richards, MD  lisinopril (PRINIVIL,ZESTRIL) 5 MG tablet TAKE 1 TABLET (5 MG TOTAL) BY MOUTH DAILY. 09/04/14   Frazier Richards, MD  loratadine (CLARITIN) 10 MG tablet Take 1 tablet (10 mg total) by mouth daily. 12/04/14   Rosemarie Ax, MD  meloxicam (MOBIC) 15 MG tablet Take 1 tablet (15 mg total) by mouth daily. 08/16/14   Frazier Richards, MD  methocarbamol (ROBAXIN) 500 MG tablet Take 1 tablet (500 mg total) by mouth 2 (two) times daily as needed for muscle spasms. 11/16/14   Waynetta Pean, PA-C  Prenatal Vit-Fe Fumarate-FA (PRENATAL MULTIVITAMIN) TABS tablet Take 0.5 tablets by  mouth daily at 12 noon.    Historical Provider, MD  traMADol (ULTRAM) 50 MG tablet Take 1 tablet (50 mg total) by mouth every 6 (six) hours as needed. 02/21/15   Marchia Bond, MD  vitamin C (ASCORBIC ACID) 500 MG tablet Take 6 tablets (3,000 mg total) by mouth daily. 09/26/13   [name], NP  vitamin E 400 UNIT capsule Take 2 capsules (800 Units total) by mouth daily. 09/26/13   [name], NP   Meds Ordered and Administered this Visit   Medications  ipratropium-albuterol (DUONEB) 0.5-2.5 (3) MG/3ML nebulizer solution 3 mL (3 mLs Nebulization Given 02/24/15 1604)  albuterol (PROVENTIL) (2.5 MG/3ML) 0.083% nebulizer solution 2.5 mg (2.5 mg Nebulization Given 02/24/15 1604)  BP 140/76 mmHg  Pulse 104  Temp(Src) 97.7 F (36.5 C) (Tympanic)  Resp 22  SpO2 99%  LMP 01/09/2012 No data found.   Physical Exam  Constitutional: She is oriented to person, place, and time. She appears well-developed and well-nourished. No distress.  HENT:  Mouth/Throat: No oropharyngeal exudate.  Oropharynx with minor erythema and clear PND. No exudates. Bilateral TMs are normal. No retraction or discoloration.  Neck: Normal range of motion. Neck supple.  Cardiovascular: Normal rate, regular rhythm and normal heart sounds.   Pulmonary/Chest: Effort normal. No respiratory distress.  Bilateral diffuse expiratory wheezes. Prolonged expiratory phase. No respiratory distress. No tachypnea.  Musculoskeletal: Normal range of motion. She exhibits no edema.  Lymphadenopathy:    She has no cervical adenopathy.  Neurological: She is alert and oriented to person, place, and time.  Skin: Skin is warm and dry. No rash noted.  Psychiatric: She has a normal mood and affect.  Nursing note and vitals reviewed.   ED Course  Procedures (including critical care time)  Labs Review Labs Reviewed - No data to display  Imaging Review No results found.   Visual Acuity Review  Right Eye Distance:   Left Eye  Distance:   Bilateral Distance:    Right Eye Near:   Left Eye Near:    Bilateral Near:         MDM   1. URI (upper respiratory infection)   2. Chest pain, unspecified chest pain type   3. Pulmonary emphysema, unspecified emphysema type ([place])   4. Bronchospasm    Upper Respiratory Infection, Adult 4 your head cold recommend using Allegra or Claritin for drainage. Since you have a heart condition do not recommend using decongestions. Drink plenty of fluids and stay well-hydrated. Use her albuterol HFA 2 puffs every 4 hours as needed for wheezing and cough. Follow-up with your primary care doctor this week, call for an appointment tomorrow. As your being discharged you brought up a new complaint of chest pressure. You have been advised that chest pressure can be a sign of heart problems. The provider recommended you have an EKG and possibly additional testing based on findings and other tests. You have declined to have an EKG or additional testing and requesting to be discharged now. She get worse, have more chest pain, vomiting, increased weakness, passing out, sweating call EMS or go directly to the emergency department. The patient will sign AMA in regards to this allowing evaluation of her chest pressure.    Janne Napoleon, NP 02/24/15 [phone]

## 2015-02-24 NOTE — ED Notes (Signed)
Pt here with flu- like sx's that started Saturday Generalized aches, chills and cough with yellow phlegm noted  Slight chest discomfort with cough

## 2015-03-05 DIAGNOSIS — L923 Foreign body granuloma of the skin and subcutaneous tissue: Secondary | ICD-10-CM | POA: Diagnosis not present

## 2015-03-31 ENCOUNTER — Emergency Department (HOSPITAL_COMMUNITY)
Admission: EM | Admit: 2015-03-31 | Discharge: 2015-03-31 | Discharge status: Against Medical Advice | Payer: Medicare Other | Attending: Emergency Medicine | Admitting: Emergency Medicine

## 2015-03-31 ENCOUNTER — Encounter (HOSPITAL_COMMUNITY): Payer: Self-pay | Admitting: Emergency Medicine

## 2015-03-31 DIAGNOSIS — Z8742 Personal history of other diseases of the female genital tract: Secondary | ICD-10-CM | POA: Insufficient documentation

## 2015-03-31 DIAGNOSIS — F17210 Nicotine dependence, cigarettes, uncomplicated: Secondary | ICD-10-CM | POA: Diagnosis not present

## 2015-03-31 DIAGNOSIS — Z79899 Other long term (current) drug therapy: Secondary | ICD-10-CM | POA: Diagnosis not present

## 2015-03-31 DIAGNOSIS — I10 Essential (primary) hypertension: Secondary | ICD-10-CM | POA: Diagnosis not present

## 2015-03-31 DIAGNOSIS — R011 Cardiac murmur, unspecified: Secondary | ICD-10-CM | POA: Diagnosis not present

## 2015-03-31 DIAGNOSIS — J449 Chronic obstructive pulmonary disease, unspecified: Secondary | ICD-10-CM | POA: Insufficient documentation

## 2015-03-31 DIAGNOSIS — Z7982 Long term (current) use of aspirin: Secondary | ICD-10-CM | POA: Diagnosis not present

## 2015-03-31 DIAGNOSIS — E0590 Thyrotoxicosis, unspecified without thyrotoxic crisis or storm: Secondary | ICD-10-CM | POA: Diagnosis not present

## 2015-03-31 DIAGNOSIS — F329 Major depressive disorder, single episode, unspecified: Secondary | ICD-10-CM

## 2015-03-31 DIAGNOSIS — Z880 Allergy status to penicillin: Secondary | ICD-10-CM | POA: Insufficient documentation

## 2015-03-31 DIAGNOSIS — F419 Anxiety disorder, unspecified: Secondary | ICD-10-CM | POA: Diagnosis not present

## 2015-03-31 DIAGNOSIS — F32A Depression, unspecified: Secondary | ICD-10-CM

## 2015-03-31 DIAGNOSIS — Z8739 Personal history of other diseases of the musculoskeletal system and connective tissue: Secondary | ICD-10-CM | POA: Diagnosis not present

## 2015-03-31 DIAGNOSIS — F429 Obsessive-compulsive disorder, unspecified: Secondary | ICD-10-CM | POA: Insufficient documentation

## 2015-03-31 DIAGNOSIS — Z7951 Long term (current) use of inhaled steroids: Secondary | ICD-10-CM | POA: Diagnosis not present

## 2015-03-31 DIAGNOSIS — Z8719 Personal history of other diseases of the digestive system: Secondary | ICD-10-CM | POA: Diagnosis not present

## 2015-03-31 LAB — CBC
HCT: 43.5 % (ref 36.0–46.0)
Hemoglobin: 14.6 g/dL (ref 12.0–15.0)
MCH: 31.9 pg (ref 26.0–34.0)
MCHC: 33.6 g/dL (ref 30.0–36.0)
MCV: 95.2 fL (ref 78.0–100.0)
Platelets: 332 10*3/uL (ref 150–400)
RBC: 4.57 MIL/uL (ref 3.87–5.11)
RDW: 12.9 % (ref 11.5–15.5)
WBC: 7.9 10*3/uL (ref 4.0–10.5)

## 2015-03-31 LAB — COMPREHENSIVE METABOLIC PANEL
ALT: 13 U/L — ABNORMAL LOW (ref 14–54)
AST: 19 U/L (ref 15–41)
Albumin: 4.1 g/dL (ref 3.5–5.0)
Alkaline Phosphatase: 104 U/L (ref 38–126)
Anion gap: 13 (ref 5–15)
BUN: 11 mg/dL (ref 6–20)
CO2: 24 mmol/L (ref 22–32)
Calcium: 9.6 mg/dL (ref 8.9–10.3)
Chloride: 99 mmol/L — ABNORMAL LOW (ref 101–111)
Creatinine, Ser: 0.86 mg/dL (ref 0.44–1.00)
GFR calc Af Amer: >60 mL/min (ref >60)
GFR calc non Af Amer: >60 mL/min (ref >60)
Glucose, Bld: 96 mg/dL (ref 65–99)
Potassium: 3.6 mmol/L (ref 3.5–5.1)
Sodium: 136 mmol/L (ref 135–145)
Total Bilirubin: 0.8 mg/dL (ref 0.3–1.2)
Total Protein: 6.7 g/dL (ref 6.5–8.1)

## 2015-03-31 LAB — SALICYLATE LEVEL: Salicylate Lvl: <4 mg/dL (ref 2.8–30.0)

## 2015-03-31 LAB — ACETAMINOPHEN LEVEL: Acetaminophen (Tylenol), Serum: <10 ug/mL — ABNORMAL LOW (ref 10–30)

## 2015-03-31 LAB — ETHANOL: Alcohol, Ethyl (B): <5 mg/dL (ref <5)

## 2015-03-31 NOTE — ED Notes (Signed)
Pt states "My ride is here and I am ready to leave"; RN advised that pt is not up for discharge; pt states "I came in voluntarily and I can leave if I want"; RN advises that pt would be leaving AMA; pt states "I want to leave, I am not staying"; pt signed out [facility]

## 2015-03-31 NOTE — ED Notes (Signed)
Pt reports ongoing depression since 03/07/2015; when questioned significance of date, pt states because "it turned cold." Pt reports does not want to life, but denies being suicidal.

## 2015-04-01 NOTE — ED Provider Notes (Signed)
CSN: [id number]     Arrival date & time 03/31/15  1851 History   First MD Initiated Contact with Patient 03/31/15 1908     Chief Complaint  Patient presents with  . Depression      The history is provided by the patient. No language interpreter was used.  [name] is a 69 y.o. female who presents to the Emergency Department complaining of depression. She reports feeling depressed since he got called out. She states she can't function and has decreased appetite and she is scared to drive. She has this vague feeling of not wanting to live because she has no plan or prior suicide attempts. She is followed by psychiatry at [facility] and has a follow-up appointment. She is compliant with her medications but does not feel like they are helping her currently. Symptoms are moderate and constant.  Past Medical History  Diagnosis Date  . OCD (obsessive compulsive disorder)   . Depression   . Anxiety   . PUD (peptic ulcer disease) 09/2006    H pylori  . Diverticulosis of colon   . GERD (gastroesophageal reflux disease)   . COPD (chronic obstructive pulmonary disease) (HCC)     Due to long history of tobacco abuse, still smoking  . History of uterine prolapse 10/2004    Dr Cletis Media  . Thyroid disease     Ho R thyroid noduel plus mild hyperthyroidism. Treated with radioiodine therapty on 04/2006. Dr Estrella Myrtle.  [name] Heart murmur   . Osteoporosis   . Hypertension    Past Surgical History  Procedure Laterality Date  . Colonoscopy  06/03/2005    Hyperplastic polyps, sigmoid diverticulosis, internal hemorroids  . Ganglion removal  12/2005    R wrist subretinacular dorsal ganglion  . Biopsy thyroid  08/2008    Atypia and Hurtle cells, partial thyroidectomy   . Tonsillectomy and adenoidectomy  10/2004  . Pft  11/2006    Obstructive pattern.  Poor response to bronchodilators.   . Thyroidectomy, partial  08/2008    Dr Ronnald Collum  . Transthoracic echocardiogram  123456    Normal systolic function.  EF  55-60%.  Mild MR, atria ok, trivial pericardial effusion  . Carotid ultrasound  09/10/09    No significant extracranial carotid artery stenosis, vertebrals are patent with antegrade flow.   . Mri head  09/20/09    No acute intracranial abn.  Signal abnormality suggestive of chronic small vessel ischemia, maximal in the right subinsular white and deep gray mater.   . Tvt  06/15/10    Tension-free Vaginal Tape, Dr Mancel Bale, for urge incontinence  . Abdominal hysterectomy    . Bladder suspension      mesh sling  . Foreign body removal Right 02/21/2015    Procedure: RIGHT FOOT FOREIGN BODY REMOVAL ;  Surgeon: Marchia Bond, MD;  Location: [place];  Service: Orthopedics;  Laterality: Right;   Family History  Problem Relation Age of Onset  . Heart disease Mother   . Heart disease Maternal Grandmother   . Heart disease Maternal Grandfather   . Cirrhosis Sister   . Hypertension Sister   . Other Neg Hx    Social History  Substance Use Topics  . Smoking status: Current Every Day Smoker -- 0.25 packs/day for 35 years    Types: Cigarettes  . Smokeless tobacco: Never Used     Comment: smokes less than 1 pack per week. ready to quitt.  . Alcohol Use: No   OB  History    Gravida Para Term Preterm AB TAB SAB Ectopic Multiple Living   2         2     Review of Systems  All other systems reviewed and are negative.     Allergies  Seroquel; Amoxicillin; Ciprofloxacin; Haloperidol lactate; Ketorolac tromethamine; Latuda; Thorazine; and Wellbutrin  Home Medications   Prior to Admission medications   Medication Sig Start Date End Date Taking? Authorizing Provider  ARIPiprazole (ABILIFY) 2 MG tablet Take 2 mg by mouth daily.  11/21/13  Yes Historical Provider, MD  aspirin 325 MG tablet Take 162.5-325 mg by mouth daily as needed for mild pain, moderate pain or headache.    Yes Historical Provider, MD  atorvastatin (LIPITOR) 40 MG tablet Take 1 tablet (40 mg total) by mouth daily.  06/15/14  Yes Frazier Richards, MD  folic acid (FOLVITE) A999333 MCG tablet Take 400 mcg by mouth daily.   Yes Historical Provider, MD  levothyroxine (SYNTHROID, LEVOTHROID) 75 MCG tablet TAKE 1 TABLET BY MOUTH DAILY FOR THYROID 09/04/14  Yes Frazier Richards, MD  lisinopril (PRINIVIL,ZESTRIL) 5 MG tablet TAKE 1 TABLET (5 MG TOTAL) BY MOUTH DAILY. 09/04/14  Yes Frazier Richards, MD  loratadine (CLARITIN) 10 MG tablet Take 1 tablet (10 mg total) by mouth daily. 12/04/14  Yes Rosemarie Ax, MD  promethazine (PHENERGAN) 25 MG tablet TAKE 1/2 TABLET (12.5 MG TOTAL) BY MOUTH EVERY 8 (EIGHT) HOURS AS NEEDED FOR NAUSEA OR VOMITING. 03/13/15  Yes Historical Provider, MD  venlafaxine XR (EFFEXOR-XR) 75 MG 24 hr capsule TAKE 3 CAPSULE BY MOUTH EVERY MORNING 02/26/15  Yes Historical Provider, MD  vitamin E 400 UNIT capsule Take 2 capsules (800 Units total) by mouth daily. 09/26/13  Yes [name], NP  albuterol (PROVENTIL HFA;VENTOLIN HFA) 108 (90 BASE) MCG/ACT inhaler Inhale 2 puffs into the lungs every 6 (six) hours as needed for wheezing or shortness of breath. 06/26/14   Timmothy Euler, MD  fluticasone-salmeterol (ADVAIR HFA) 230-21 MCG/ACT inhaler Inhale 2 puffs into the lungs 2 (two) times daily. 06/26/14   Timmothy Euler, MD  meloxicam (MOBIC) 15 MG tablet Take 1 tablet (15 mg total) by mouth daily. Patient not taking: Reported on 03/31/2015 08/16/14   Frazier Richards, MD  methocarbamol (ROBAXIN) 500 MG tablet Take 1 tablet (500 mg total) by mouth 2 (two) times daily as needed for muscle spasms. Patient not taking: Reported on 03/31/2015 11/16/14   Waynetta Pean, PA-C  traMADol (ULTRAM) 50 MG tablet Take 1 tablet (50 mg total) by mouth every 6 (six) hours as needed. Patient not taking: Reported on 03/31/2015 02/21/15   Marchia Bond, MD   BP 119/82 mmHg  Pulse 117  Temp(Src) 97.9 F (36.6 C) (Oral)  Resp 18  Ht 4\' 6"  (1.372 m)  Wt 116 lb (52.617 kg)  BMI 27.95 kg/m2  SpO2 95%  LMP 01/09/2012 Physical Exam   Constitutional: She is oriented to person, place, and time. She appears well-developed and well-nourished.  HENT:  Head: Normocephalic and atraumatic.  Cardiovascular: Normal rate and regular rhythm.   Pulmonary/Chest: Effort normal. No respiratory distress.  Musculoskeletal: Normal range of motion.  Neurological: She is alert and oriented to person, place, and time.  Skin: Skin is warm.  Psychiatric:  Flat affect  Nursing note and vitals reviewed.   ED Course  Procedures (including critical care time) Labs Review Labs Reviewed  COMPREHENSIVE METABOLIC PANEL - Abnormal; Notable for the following:  Chloride 99 (*)    ALT 13 (*)    All other components within normal limits  ACETAMINOPHEN LEVEL - Abnormal; Notable for the following:    Acetaminophen (Tylenol), Serum <10 (*)    All other components within normal limits  ETHANOL  SALICYLATE LEVEL  CBC  URINE RAPID DRUG SCREEN, HOSP PERFORMED    Imaging Review No results found. I have personally reviewed and evaluated these images and lab results as part of my medical decision-making.   EKG Interpretation None      MDM   Final diagnoses:  Depression    Patient is here for evaluation of depression. She is not actively suicidal emergency department and will contract for safety. She is wishing to leave the department prior to TTS evaluation. Unable to keep patient in the department involuntarily. Discussed close follow-up with [facility], and continuing her current medications. patient left prior to being up to receive her discharge instructions.     Quintella Reichert, MD 04/01/15 [phone]

## 2015-04-02 DIAGNOSIS — Z880 Allergy status to penicillin: Secondary | ICD-10-CM | POA: Diagnosis not present

## 2015-04-02 DIAGNOSIS — F312 Bipolar disorder, current episode manic severe with psychotic features: Secondary | ICD-10-CM | POA: Diagnosis not present

## 2015-04-02 DIAGNOSIS — N179 Acute kidney failure, unspecified: Secondary | ICD-10-CM | POA: Diagnosis not present

## 2015-04-02 DIAGNOSIS — J028 Acute pharyngitis due to other specified organisms: Secondary | ICD-10-CM | POA: Diagnosis not present

## 2015-04-02 DIAGNOSIS — K5909 Other constipation: Secondary | ICD-10-CM | POA: Diagnosis not present

## 2015-04-02 DIAGNOSIS — K219 Gastro-esophageal reflux disease without esophagitis: Secondary | ICD-10-CM | POA: Diagnosis not present

## 2015-04-02 DIAGNOSIS — N390 Urinary tract infection, site not specified: Secondary | ICD-10-CM | POA: Diagnosis not present

## 2015-04-02 DIAGNOSIS — K5900 Constipation, unspecified: Secondary | ICD-10-CM | POA: Diagnosis not present

## 2015-04-02 DIAGNOSIS — E876 Hypokalemia: Secondary | ICD-10-CM | POA: Diagnosis not present

## 2015-04-02 DIAGNOSIS — N189 Chronic kidney disease, unspecified: Secondary | ICD-10-CM | POA: Diagnosis not present

## 2015-04-02 DIAGNOSIS — R531 Weakness: Secondary | ICD-10-CM | POA: Diagnosis not present

## 2015-04-02 DIAGNOSIS — I10 Essential (primary) hypertension: Secondary | ICD-10-CM | POA: Diagnosis not present

## 2015-04-02 DIAGNOSIS — E039 Hypothyroidism, unspecified: Secondary | ICD-10-CM | POA: Diagnosis not present

## 2015-04-02 DIAGNOSIS — J449 Chronic obstructive pulmonary disease, unspecified: Secondary | ICD-10-CM | POA: Diagnosis not present

## 2015-04-02 DIAGNOSIS — F3289 Other specified depressive episodes: Secondary | ICD-10-CM | POA: Diagnosis not present

## 2015-04-02 DIAGNOSIS — N182 Chronic kidney disease, stage 2 (mild): Secondary | ICD-10-CM | POA: Diagnosis not present

## 2015-04-02 DIAGNOSIS — I129 Hypertensive chronic kidney disease with stage 1 through stage 4 chronic kidney disease, or unspecified chronic kidney disease: Secondary | ICD-10-CM | POA: Diagnosis not present

## 2015-04-02 DIAGNOSIS — Z888 Allergy status to other drugs, medicaments and biological substances status: Secondary | ICD-10-CM | POA: Diagnosis not present

## 2015-04-02 DIAGNOSIS — Z7982 Long term (current) use of aspirin: Secondary | ICD-10-CM | POA: Diagnosis not present

## 2015-04-02 DIAGNOSIS — Z79899 Other long term (current) drug therapy: Secondary | ICD-10-CM | POA: Diagnosis not present

## 2015-04-02 DIAGNOSIS — N289 Disorder of kidney and ureter, unspecified: Secondary | ICD-10-CM | POA: Diagnosis not present

## 2015-04-02 DIAGNOSIS — B9789 Other viral agents as the cause of diseases classified elsewhere: Secondary | ICD-10-CM | POA: Diagnosis not present

## 2015-04-02 DIAGNOSIS — J441 Chronic obstructive pulmonary disease with (acute) exacerbation: Secondary | ICD-10-CM | POA: Diagnosis not present

## 2015-04-02 DIAGNOSIS — J438 Other emphysema: Secondary | ICD-10-CM | POA: Diagnosis not present

## 2015-04-02 DIAGNOSIS — F332 Major depressive disorder, recurrent severe without psychotic features: Secondary | ICD-10-CM | POA: Diagnosis not present

## 2015-04-19 ENCOUNTER — Emergency Department (HOSPITAL_COMMUNITY): Payer: Medicare Other

## 2015-04-19 ENCOUNTER — Encounter (HOSPITAL_COMMUNITY): Payer: Self-pay | Admitting: Emergency Medicine

## 2015-04-19 ENCOUNTER — Emergency Department (HOSPITAL_COMMUNITY)
Admission: EM | Admit: 2015-04-19 | Discharge: 2015-04-21 | Disposition: A | Discharge status: Home / Self Care | Payer: Medicare Other | Attending: Emergency Medicine | Admitting: Emergency Medicine

## 2015-04-19 DIAGNOSIS — F429 Obsessive-compulsive disorder, unspecified: Secondary | ICD-10-CM | POA: Diagnosis not present

## 2015-04-19 DIAGNOSIS — Z8719 Personal history of other diseases of the digestive system: Secondary | ICD-10-CM | POA: Insufficient documentation

## 2015-04-19 DIAGNOSIS — R0602 Shortness of breath: Secondary | ICD-10-CM | POA: Diagnosis not present

## 2015-04-19 DIAGNOSIS — K5900 Constipation, unspecified: Secondary | ICD-10-CM | POA: Insufficient documentation

## 2015-04-19 DIAGNOSIS — Z8742 Personal history of other diseases of the female genital tract: Secondary | ICD-10-CM | POA: Insufficient documentation

## 2015-04-19 DIAGNOSIS — R011 Cardiac murmur, unspecified: Secondary | ICD-10-CM | POA: Diagnosis not present

## 2015-04-19 DIAGNOSIS — J441 Chronic obstructive pulmonary disease with (acute) exacerbation: Secondary | ICD-10-CM | POA: Insufficient documentation

## 2015-04-19 DIAGNOSIS — R05 Cough: Secondary | ICD-10-CM | POA: Insufficient documentation

## 2015-04-19 DIAGNOSIS — Z7951 Long term (current) use of inhaled steroids: Secondary | ICD-10-CM | POA: Insufficient documentation

## 2015-04-19 DIAGNOSIS — F29 Unspecified psychosis not due to a substance or known physiological condition: Secondary | ICD-10-CM | POA: Diagnosis not present

## 2015-04-19 DIAGNOSIS — I10 Essential (primary) hypertension: Secondary | ICD-10-CM | POA: Diagnosis not present

## 2015-04-19 DIAGNOSIS — F332 Major depressive disorder, recurrent severe without psychotic features: Secondary | ICD-10-CM | POA: Diagnosis present

## 2015-04-19 DIAGNOSIS — F419 Anxiety disorder, unspecified: Secondary | ICD-10-CM | POA: Insufficient documentation

## 2015-04-19 DIAGNOSIS — F329 Major depressive disorder, single episode, unspecified: Secondary | ICD-10-CM | POA: Diagnosis not present

## 2015-04-19 DIAGNOSIS — R0981 Nasal congestion: Secondary | ICD-10-CM | POA: Insufficient documentation

## 2015-04-19 DIAGNOSIS — R531 Weakness: Secondary | ICD-10-CM | POA: Diagnosis not present

## 2015-04-19 DIAGNOSIS — Z880 Allergy status to penicillin: Secondary | ICD-10-CM | POA: Insufficient documentation

## 2015-04-19 DIAGNOSIS — Z79899 Other long term (current) drug therapy: Secondary | ICD-10-CM | POA: Insufficient documentation

## 2015-04-19 DIAGNOSIS — Z8711 Personal history of peptic ulcer disease: Secondary | ICD-10-CM | POA: Diagnosis not present

## 2015-04-19 DIAGNOSIS — Z7982 Long term (current) use of aspirin: Secondary | ICD-10-CM | POA: Diagnosis not present

## 2015-04-19 DIAGNOSIS — F322 Major depressive disorder, single episode, severe without psychotic features: Secondary | ICD-10-CM | POA: Diagnosis not present

## 2015-04-19 DIAGNOSIS — E0590 Thyrotoxicosis, unspecified without thyrotoxic crisis or storm: Secondary | ICD-10-CM | POA: Diagnosis not present

## 2015-04-19 DIAGNOSIS — F17210 Nicotine dependence, cigarettes, uncomplicated: Secondary | ICD-10-CM | POA: Insufficient documentation

## 2015-04-19 LAB — BASIC METABOLIC PANEL
Anion gap: 15 (ref 5–15)
BUN: 14 mg/dL (ref 6–20)
CO2: 22 mmol/L (ref 22–32)
Calcium: 9.4 mg/dL (ref 8.9–10.3)
Chloride: 104 mmol/L (ref 101–111)
Creatinine, Ser: 0.86 mg/dL (ref 0.44–1.00)
GFR calc Af Amer: >60 mL/min (ref >60)
GFR calc non Af Amer: >60 mL/min (ref >60)
Glucose, Bld: 84 mg/dL (ref 65–99)
Potassium: 4.2 mmol/L (ref 3.5–5.1)
Sodium: 141 mmol/L (ref 135–145)

## 2015-04-19 LAB — ACETAMINOPHEN LEVEL: Acetaminophen (Tylenol), Serum: <10 ug/mL — ABNORMAL LOW (ref 10–30)

## 2015-04-19 LAB — CBC WITH DIFFERENTIAL/PLATELET
Basophils Absolute: 0 10*3/uL (ref 0.0–0.1)
Basophils Relative: 0 %
Eosinophils Absolute: 0 10*3/uL (ref 0.0–0.7)
Eosinophils Relative: 0 %
HCT: 41 % (ref 36.0–46.0)
Hemoglobin: 13.7 g/dL (ref 12.0–15.0)
Lymphocytes Relative: 12 %
Lymphs Abs: 0.6 10*3/uL — ABNORMAL LOW (ref 0.7–4.0)
MCH: 32.2 pg (ref 26.0–34.0)
MCHC: 33.4 g/dL (ref 30.0–36.0)
MCV: 96.5 fL (ref 78.0–100.0)
Monocytes Absolute: 0.5 10*3/uL (ref 0.1–1.0)
Monocytes Relative: 11 %
Neutro Abs: 3.5 10*3/uL (ref 1.7–7.7)
Neutrophils Relative %: 77 %
Platelets: 203 10*3/uL (ref 150–400)
RBC: 4.25 MIL/uL (ref 3.87–5.11)
RDW: 13.3 % (ref 11.5–15.5)
WBC: 4.7 10*3/uL (ref 4.0–10.5)

## 2015-04-19 LAB — RAPID URINE DRUG SCREEN, HOSP PERFORMED
Amphetamines: NOT DETECTED
Barbiturates: NOT DETECTED
Benzodiazepines: NOT DETECTED
Cocaine: NOT DETECTED
Opiates: NOT DETECTED
Tetrahydrocannabinol: NOT DETECTED

## 2015-04-19 LAB — I-STAT TROPONIN, ED: Troponin i, poc: 0 ng/mL (ref 0.00–0.08)

## 2015-04-19 LAB — SALICYLATE LEVEL: Salicylate Lvl: <4 mg/dL (ref 2.8–30.0)

## 2015-04-19 LAB — ETHANOL: Alcohol, Ethyl (B): <5 mg/dL (ref <5)

## 2015-04-19 MED ORDER — IPRATROPIUM BROMIDE 0.02 % IN SOLN
0.5000 mg | Freq: Once | RESPIRATORY_TRACT | Status: AC
  Administered 2015-04-19: 0.5 mg via RESPIRATORY_TRACT
  Filled 2015-04-19: qty 2.5

## 2015-04-19 MED ORDER — ONDANSETRON HCL 4 MG PO TABS: 4.0000 mg | ORAL_TABLET | Freq: Three times a day (TID) | ORAL | Status: DC | PRN

## 2015-04-19 MED ORDER — LORAZEPAM 1 MG PO TABS
1.0000 mg | ORAL_TABLET | Freq: Three times a day (TID) | ORAL | Status: DC | PRN
  Administered 2015-04-19: 1 mg via ORAL
  Filled 2015-04-19: qty 1

## 2015-04-19 MED ORDER — SODIUM CHLORIDE 0.9 % IV BOLUS (SEPSIS)
1000.0000 mL | Freq: Once | INTRAVENOUS | Status: AC
  Administered 2015-04-19: 1000 mL via INTRAVENOUS

## 2015-04-19 MED ORDER — NICOTINE 21 MG/24HR TD PT24: 21.0000 mg | MEDICATED_PATCH | Freq: Every day | TRANSDERMAL | Status: DC

## 2015-04-19 MED ORDER — ALUM & MAG HYDROXIDE-SIMETH 200-200-20 MG/5ML PO SUSP: 30.0000 mL | ORAL | Status: DC | PRN

## 2015-04-19 MED ORDER — ACETAMINOPHEN 325 MG PO TABS: 650.0000 mg | ORAL_TABLET | ORAL | Status: DC | PRN

## 2015-04-19 MED ORDER — ALBUTEROL SULFATE (2.5 MG/3ML) 0.083% IN NEBU
5.0000 mg | INHALATION_SOLUTION | Freq: Once | RESPIRATORY_TRACT | Status: AC
  Administered 2015-04-19: 5 mg via RESPIRATORY_TRACT
  Filled 2015-04-19: qty 6

## 2015-04-19 NOTE — ED Provider Notes (Signed)
CSN: [id number]     Arrival date & time 04/19/15  1808 History   First MD Initiated Contact with Patient 04/19/15 1820     Chief Complaint  Patient presents with  . Depression  . Shortness of Breath    HPI   [name] is a 70 y.o. female with a PMH of OCD, depression, anxiety, HTN, GERD, thyroid disease who presents to the ED with depression. She reports her depression has progressively worsened with the cold weather. She denies alleviating factors, and states she has not been taking her haldol as prescribed. She reports transient thoughts of wanting to harm herself, though states today that she wants to be happy and live. She notes she has not been eating or drinking, and has not been driving her car because she is afraid to. She denies homicidal ideation. She denies hallucinations. She denies alcohol or drug use. She also reports nasal congestion, productive cough, and shortness of breath. She denies fever, chills, or chest pain. She states she feels "stopped up," which makes her feel like she cannot breath. She denies exacerbating or alleviating factors.   Past Medical History  Diagnosis Date  . OCD (obsessive compulsive disorder)   . Depression   . Anxiety   . PUD (peptic ulcer disease) 09/2006    H pylori  . Diverticulosis of colon   . GERD (gastroesophageal reflux disease)   . COPD (chronic obstructive pulmonary disease) (HCC)     Due to long history of tobacco abuse, still smoking  . History of uterine prolapse 10/2004    Dr Cletis Media  . Thyroid disease     Ho R thyroid noduel plus mild hyperthyroidism. Treated with radioiodine therapty on 04/2006. Dr Estrella Myrtle.  [name] Heart murmur   . Osteoporosis   . Hypertension    Past Surgical History  Procedure Laterality Date  . Colonoscopy  06/03/2005    Hyperplastic polyps, sigmoid diverticulosis, internal hemorroids  . Ganglion removal  12/2005    R wrist subretinacular dorsal ganglion  . Biopsy thyroid  08/2008    Atypia and Hurtle cells,  partial thyroidectomy   . Tonsillectomy and adenoidectomy  10/2004  . Pft  11/2006    Obstructive pattern.  Poor response to bronchodilators.   . Thyroidectomy, partial  08/2008    Dr Ronnald Collum  . Transthoracic echocardiogram  123456    Normal systolic function.  EF 55-60%.  Mild MR, atria ok, trivial pericardial effusion  . Carotid ultrasound  09/10/09    No significant extracranial carotid artery stenosis, vertebrals are patent with antegrade flow.   . Mri head  09/20/09    No acute intracranial abn.  Signal abnormality suggestive of chronic small vessel ischemia, maximal in the right subinsular white and deep gray mater.   . Tvt  06/15/10    Tension-free Vaginal Tape, Dr Mancel Bale, for urge incontinence  . Abdominal hysterectomy    . Bladder suspension      mesh sling  . Foreign body removal Right 02/21/2015    Procedure: RIGHT FOOT FOREIGN BODY REMOVAL ;  Surgeon: Marchia Bond, MD;  Location: [place];  Service: Orthopedics;  Laterality: Right;   Family History  Problem Relation Age of Onset  . Heart disease Mother   . Heart disease Maternal Grandmother   . Heart disease Maternal Grandfather   . Cirrhosis Sister   . Hypertension Sister   . Other Neg Hx    Social History  Substance Use Topics  . Smoking  status: Current Every Day Smoker -- 0.25 packs/day for 35 years    Types: Cigarettes  . Smokeless tobacco: Never Used     Comment: smokes less than 1 pack per week. ready to quitt.  . Alcohol Use: No   OB History    Gravida Para Term Preterm AB TAB SAB Ectopic Multiple Living   2         2       Review of Systems  Constitutional: Positive for fatigue. Negative for fever and chills.  HENT: Positive for congestion.   Respiratory: Positive for cough, shortness of breath and wheezing.   Cardiovascular: Negative for chest pain.  Gastrointestinal: Positive for constipation. Negative for nausea, vomiting, abdominal pain and diarrhea.       Reports  constipation, which she states is unchanged from baseline.  Neurological: Positive for weakness. Negative for dizziness, syncope, light-headedness and headaches.       Reports generalized weakness.  Psychiatric/Behavioral: Negative for suicidal ideas and hallucinations.  All other systems reviewed and are negative.     Allergies  Seroquel; Amoxicillin; Ciprofloxacin; Haloperidol lactate; Ketorolac tromethamine; Latuda; Thorazine; and Wellbutrin  Home Medications   Prior to Admission medications   Medication Sig Start Date End Date Taking? Authorizing Provider  albuterol (PROVENTIL HFA;VENTOLIN HFA) 108 (90 BASE) MCG/ACT inhaler Inhale 2 puffs into the lungs every 6 (six) hours as needed for wheezing or shortness of breath. 06/26/14  Yes Timmothy Euler, MD  ARIPiprazole (ABILIFY) 2 MG tablet Take 2 mg by mouth daily.  11/21/13  Yes Historical Provider, MD  aspirin 325 MG tablet Take 162.5-325 mg by mouth daily as needed for mild pain, moderate pain or headache.    Yes Historical Provider, MD  atorvastatin (LIPITOR) 40 MG tablet Take 1 tablet (40 mg total) by mouth daily. 06/15/14  Yes Frazier Richards, MD  fluticasone-salmeterol (ADVAIR HFA) [phone] MCG/ACT inhaler Inhale 2 puffs into the lungs 2 (two) times daily. 06/26/14  Yes Timmothy Euler, MD  folic acid (FOLVITE) [id number] MCG tablet Take 400 mcg by mouth daily.   Yes Historical Provider, MD  levothyroxine (SYNTHROID, LEVOTHROID) 75 MCG tablet TAKE 1 TABLET BY MOUTH DAILY FOR THYROID 09/04/14  Yes Frazier Richards, MD  lisinopril (PRINIVIL,ZESTRIL) 5 MG tablet TAKE 1 TABLET (5 MG TOTAL) BY MOUTH DAILY. 09/04/14  Yes Frazier Richards, MD  loratadine (CLARITIN) 10 MG tablet Take 1 tablet (10 mg total) by mouth daily. 12/04/14  Yes Rosemarie Ax, MD  promethazine (PHENERGAN) 25 MG tablet TAKE 1/2 TABLET (12.5 MG TOTAL) BY MOUTH EVERY 8 (EIGHT) HOURS AS NEEDED FOR NAUSEA OR VOMITING. 03/13/15  Yes Historical Provider, MD  venlafaxine XR (EFFEXOR-XR) 75 MG  24 hr capsule TAKE 3 CAPSULE BY MOUTH EVERY MORNING 02/26/15  Yes Historical Provider, MD  vitamin E 400 UNIT capsule Take 2 capsules (800 Units total) by mouth daily. 09/26/13  Yes [name], NP  meloxicam (MOBIC) 15 MG tablet Take 1 tablet (15 mg total) by mouth daily. Patient not taking: Reported on 03/31/2015 08/16/14   Frazier Richards, MD  methocarbamol (ROBAXIN) 500 MG tablet Take 1 tablet (500 mg total) by mouth 2 (two) times daily as needed for muscle spasms. Patient not taking: Reported on 03/31/2015 11/16/14   Waynetta Pean, PA-C  traMADol (ULTRAM) 50 MG tablet Take 1 tablet (50 mg total) by mouth every 6 (six) hours as needed. Patient not taking: Reported on 03/31/2015 02/21/15   Marchia Bond, MD  BP 134/85 mmHg  Pulse 102  Temp(Src) 98.2 F (36.8 C) (Oral)  Resp 19  Ht 4\' 6"  (1.372 m)  Wt 52.164 kg  BMI 27.71 kg/m2  SpO2 91%  LMP 01/09/2012 Physical Exam  Constitutional: She is oriented to person, place, and time. No distress.  Frail appearing female in no acute distress.   HENT:  Head: Normocephalic and atraumatic.  Right Ear: External ear normal.  Left Ear: External ear normal.  Nose: Nose normal.  Mouth/Throat: Uvula is midline. Mucous membranes are dry. No oropharyngeal exudate, posterior oropharyngeal edema, posterior oropharyngeal erythema or tonsillar abscesses.  Mucous membranes dry.  Eyes: Conjunctivae, EOM and lids are normal. Pupils are equal, round, and reactive to light. Right eye exhibits no discharge. Left eye exhibits no discharge. No scleral icterus.  Neck: Normal range of motion. Neck supple.  Cardiovascular: Normal rate, regular rhythm, normal heart sounds, intact distal pulses and normal pulses.   Pulmonary/Chest: Effort normal. No respiratory distress. She has wheezes. She has no rales.  Wheezing to upper lung fields bilaterally.  Abdominal: Soft. Normal appearance and bowel sounds are normal. She exhibits no distension and no mass. There is no  tenderness. There is no rigidity, no rebound and no guarding.  Musculoskeletal: Normal range of motion. She exhibits no edema or tenderness.  Neurological: She is alert and oriented to person, place, and time. She has normal strength. No cranial nerve deficit or sensory deficit.  Skin: Skin is warm, dry and intact. No rash noted. She is not diaphoretic. No erythema. No pallor.  Psychiatric: Her speech is normal and behavior is normal. She exhibits a depressed mood. She expresses no homicidal and no suicidal ideation. She expresses no suicidal plans and no homicidal plans.  Nursing note and vitals reviewed.   ED Course  Procedures (including critical care time)  Labs Review Labs Reviewed  CBC WITH DIFFERENTIAL/PLATELET - Abnormal; Notable for the following:    Lymphs Abs 0.6 (*)    All other components within normal limits  ACETAMINOPHEN LEVEL - Abnormal; Notable for the following:    Acetaminophen (Tylenol), Serum <10 (*)    All other components within normal limits  BASIC METABOLIC PANEL  ETHANOL  SALICYLATE LEVEL  URINE RAPID DRUG SCREEN, HOSP PERFORMED  I-STAT TROPOININ, ED    Imaging Review Dg Chest 2 View  04/19/2015  CLINICAL DATA:  Shortness of breath and depression, poor appetite, generalized weakness, nasal congestion, smoker, hypertension, COPD EXAM: CHEST  2 VIEW COMPARISON:  06/24/2014 FINDINGS: Normal heart size, mediastinal contours, and pulmonary vascularity. Atherosclerotic calcification and mild tortuosity of thoracic aorta. Emphysematous and bronchitic changes consistent with COPD. No acute infiltrate, pleural effusion, or pneumothorax. Bones demineralized. IMPRESSION: COPD changes. No acute abnormalities. Electronically Signed   By: Lavonia Dana M.D.   On: 04/19/2015 19:33     I have personally reviewed and evaluated these images and lab results as part of my medical decision-making.   EKG Interpretation   Date/Time:  Saturday April 19 2015 18:29:41  EST Ventricular Rate:  109 PR Interval:  170 QRS Duration: 75 QT Interval:  327 QTC Calculation: 440 R Axis:   83 Text Interpretation:  Sinus tachycardia RAE, consider biatrial enlargement  Anterior infarct, old since last tracing no significant change Confirmed  by BELFI  MD, MELANIE [id number]) on 04/19/2015 6:39:16 PM      MDM   Final diagnoses:  Depression    70 year old feel presents with depression. Also reports nasal congestion, cough, and shortness of breath,  which she attributes to being "stopped up."  Patient is afebrile. Mildly tachycardic. Mucous membranes dry. Heart regular rhythm. Wheezing to upper lung fields bilaterally. Abdomen soft, nontender, nondistended. No rebound, guarding, or masses. Patient moves all extremities without difficulty. Normal neuro exam with no focal deficit.  Given breathing treatment and 1L NS bolus. TTS consulted.   CBC negative for leukocytosis or anemia. BMP unremarkable. Ethanol, salicylate, acetaminophen negative. UDS pending. EKG sinus tachycardia, heart rate 109. Troponin negative. Chest x-ray COPD changes, no acute abnormalities.  On reassessment of patient, wheezing significantly improved. Per [facility] assessment, patient meets inpatient criteria. Patient discussed with and seen by Dr. Tamera Punt. Patient medically cleared at this time.  BP 134/85 mmHg  Pulse 102  Temp(Src) 98.2 F (36.8 C) (Oral)  Resp 19  Ht 4\' 6"  (1.372 m)  Wt 52.164 kg  BMI 27.71 kg/m2  SpO2 91%  LMP 01/09/2012     Marella Chimes, PA-C 04/19/15 2157  Malvin Johns, MD 04/19/15 2158
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
## 2015-04-19 NOTE — ED Notes (Signed)
Pt. To [facility] from ED ambulatory without difficulty, to room 36 . Report from [facility]. Pt. Is alert and oriented, warm and dry in no distress. Pt. Denies SI, HI, and AVH. Pt. Calm and cooperative. Pt. Made aware of security cameras and Q15 minute rounds. Pt. Encouraged to let Nursing staff know of any concerns or needs.

## 2015-04-19 NOTE — ED Notes (Addendum)
Pt arrived via EMS with report of SHOB and depression.  Pt reported not taking Haldol medication as prescribed, poor appetite, generalized weakness, nasal congestion. Pt denies SI/HI and audible/visual hallucinations. Pt denies chest pain.

## 2015-04-19 NOTE — ED Notes (Signed)
Bed: [id number] Expected date: 04/19/15 Expected time: 6:03 PM Means of arrival: Ambulance Comments: Elderly depression

## 2015-04-19 NOTE — ED Notes (Signed)
Pt. C/o anxiety and insomnia. 

## 2015-04-20 DIAGNOSIS — F332 Major depressive disorder, recurrent severe without psychotic features: Secondary | ICD-10-CM

## 2015-04-20 MED ORDER — ATORVASTATIN CALCIUM 40 MG PO TABS
40.0000 mg | ORAL_TABLET | Freq: Every day | ORAL | Status: DC
  Administered 2015-04-20: 40 mg via ORAL
  Filled 2015-04-20 (×2): qty 1

## 2015-04-20 MED ORDER — SENNOSIDES-DOCUSATE SODIUM 8.6-50 MG PO TABS
1.0000 | ORAL_TABLET | Freq: Every day | ORAL | Status: DC
Start: 2015-04-20 — End: 2015-04-21
  Administered 2015-04-20: 1 via ORAL
  Filled 2015-04-20: qty 1

## 2015-04-20 MED ORDER — MIRTAZAPINE 7.5 MG PO TABS
7.5000 mg | ORAL_TABLET | Freq: Every day | ORAL | Status: DC
  Administered 2015-04-20: 7.5 mg via ORAL
  Filled 2015-04-20 (×2): qty 1

## 2015-04-20 MED ORDER — VITAMIN E 180 MG (400 UNIT) PO CAPS
800.0000 [IU] | ORAL_CAPSULE | Freq: Every day | ORAL | Status: DC
Start: 2015-04-20 — End: 2015-04-21
  Administered 2015-04-20: 800 [IU] via ORAL
  Filled 2015-04-20 (×2): qty 2

## 2015-04-20 MED ORDER — LEVOTHYROXINE SODIUM 75 MCG PO TABS
75.0000 ug | ORAL_TABLET | Freq: Every day | ORAL | Status: DC
  Administered 2015-04-21: 75 ug via ORAL
  Filled 2015-04-20 (×2): qty 1

## 2015-04-20 MED ORDER — MAGNESIUM HYDROXIDE 400 MG/5ML PO SUSP
30.0000 mL | Freq: Every day | ORAL | Status: DC
  Administered 2015-04-20: 30 mL via ORAL
  Filled 2015-04-20 (×2): qty 30

## 2015-04-20 MED ORDER — LISINOPRIL 5 MG PO TABS
5.0000 mg | ORAL_TABLET | Freq: Every day | ORAL | Status: DC
  Administered 2015-04-20: 5 mg via ORAL
  Filled 2015-04-20 (×2): qty 1

## 2015-04-20 MED ORDER — LEVOTHYROXINE SODIUM 75 MCG PO TABS: 75.0000 ug | ORAL_TABLET | Freq: Every day | ORAL | Status: DC

## 2015-04-20 MED ORDER — VENLAFAXINE HCL ER 75 MG PO CP24
75.0000 mg | ORAL_CAPSULE | Freq: Every day | ORAL | Status: DC
  Filled 2015-04-20 (×2): qty 1

## 2015-04-20 NOTE — ED Notes (Signed)
Pt. Noted sleeping in room. No complaints or concerns voiced. No distress or abnormal behavior noted. Will continue to monitor with security cameras. Q 15 minute rounds continue. 

## 2015-04-20 NOTE — ED Notes (Signed)
Report received from Deah Rambo RN. Pt. Sleeping, respirations regular and unlabored. Will continue to monitor for safety via security cameras and Q 15 minute checks. 

## 2015-04-20 NOTE — ED Notes (Signed)
On the phone 
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
## 2015-04-20 NOTE — ED Notes (Signed)
Up to the bathroom 

## 2015-04-20 NOTE — ED Notes (Signed)
Pt. Noted in room. No complaints or concerns voiced. No distress or abnormal behavior noted. Will continue to monitor with security cameras. Q 15 minute rounds continue. 

## 2015-04-20 NOTE — ED Notes (Signed)
Up to the dayroom

## 2015-04-21 DIAGNOSIS — F332 Major depressive disorder, recurrent severe without psychotic features: Secondary | ICD-10-CM | POA: Insufficient documentation

## 2015-04-21 MED ORDER — MIRTAZAPINE 15 MG PO TABS: 15.0000 mg | ORAL_TABLET | Freq: Every day | ORAL | Status: DC

## 2015-04-21 MED ORDER — MIRTAZAPINE 30 MG PO TABS: 15.0000 mg | ORAL_TABLET | Freq: Every day | ORAL | Status: DC

## 2015-04-21 NOTE — ED Notes (Signed)
Pt. Noted sleeping in room. No complaints or concerns voiced. No distress or abnormal behavior noted. Will continue to monitor with security cameras. Q 15 minute rounds continue. 

## 2015-04-21 NOTE — BH Assessment (Signed)
[facility] Assessment Progress Note  Per Corena Pilgrim, MD, this pt does not require psychiatric hospitalization at this time.  Pt is to be discharged from [facility] with referral information for [facility].  This has been included in pt's discharge instructions.  Pt's nurse, Nena Jordan, has been notified.  Jalene Mullet, [place] Triage Specialist [phone]

## 2015-04-21 NOTE — Progress Notes (Signed)
CSW was asked by nurse if patient could receive cab to home. CSW staffed with Asst. Director, Social Work regarding patient. CSW asked if patient could be discharged by cab. CSW informed nurse of questions to ask patient and CSW assessed patient to see if cab service would be feasible. Nurse reassessed patient after CSW as well. Patient will be discharged by cab. [facility] called for transport. Cab voucher given to nurse tech to give to patient.   Genice Rouge [id number] ED CSW 04/21/2015 11:05 AM

## 2015-04-21 NOTE — Discharge Instructions (Signed)
For your ongoing mental health needs, you are advised to follow up with [facility].  If you do not currently have an appointment, new and returning patients are seen at their walk-in clinic.  Walk-in hours are Monday - Friday from 8:00 am - 3:00 pm.  Walk-in patients are seen on a first come, first served basis.  Try to arrive as early as possible for he best chance of being seen the same day: ° °     [facility] °     [address] °     [place], [place] [address] °     [phone] °

## 2015-04-21 NOTE — ED Notes (Signed)
Pt. Up to restroom. 
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
## 2015-04-21 NOTE — [facility] Suicide Risk Assessment (Addendum)
Suicide Risk Assessment  Discharge Assessment   [facility] Discharge Suicide Risk Assessment   Demographic Factors:  Age 70 or older, Caucasian and Living alone  Total Time spent with patient: 30 minutes   Musculoskeletal: Strength & Muscle Tone: within normal limits Gait & Station: normal Patient leans: N/A  Psychiatric Specialty Exam: Review of Systems  Constitutional: Negative.   HENT: Negative.   Eyes: Negative.   Respiratory: Negative.   Cardiovascular: Negative.   Gastrointestinal: Negative.   Genitourinary: Negative.   Musculoskeletal: Negative.   Skin: Negative.   Neurological: Negative.   Endo/Heme/Allergies: Negative.   Psychiatric/Behavioral: Positive for depression.    Blood pressure 127/71, pulse 80, temperature 98 F (36.7 C), temperature source Oral, resp. rate 12, height 4\' 6"  (1.372 m), weight 52.164 kg (115 lb), last menstrual period 01/09/2012, SpO2 91 %.Body mass index is 27.71 kg/(m^2).  General Appearance: Casual  Eye Contact::  Good  Speech:  Normal Rate  Volume:  WDL  Mood:  Depressed, mild  Affect:  Congruent  Thought Process:  Coherent  Orientation:  Full (Time, Place, and Person)  Thought Content:  Rumination  Suicidal Thoughts:  No  Homicidal Thoughts:  No  Memory:  Immediate;   Good Recent;   Good Remote;   Good  Judgement:  Fair  Insight:  Fair  Psychomotor Activity:  Normal  Concentration:  Good  Recall:  Good  Fund of Knowledge:Good  Language: Good  Akathisia:  No  Handed:  Right  AIMS (if indicated):     Assets:  Housing Leisure Time Physical Health Resilience Social Support  ADL's:  Intact  Cognition: WNL  Sleep:         Has this patient used any form of tobacco in the last 30 days? (Cigarettes, Smokeless Tobacco, Cigars, and/or Pipes)  Yes, patient offered Rx for nicotine cessation, refused  Mental Status Per Nursing Assessment::   On Admission:   Suicidal ideations  Current Mental Status by Physician: NA  Loss  Factors: NA  Historical Factors: NA  Risk Reduction Factors:   Sense of responsibility to family, Positive social support and Positive therapeutic relationship  Continued Clinical Symptoms:  Depression, mild  Cognitive Features That Contribute To Risk:  None    Suicide Risk:  Minimal: No identifiable suicidal ideation.  Patients presenting with no risk factors but with morbid ruminations; may be classified as minimal risk based on the severity of the depressive symptoms  Principal Problem: Depression, major, severe recurrence [facility]) Discharge Diagnoses:  Patient Active Problem List   Diagnosis Date Noted  . Depression, major, severe recurrence ([place]) [F33.2] 07/22/2013    Priority: High  . BIPOLAR DISORDER UNSPECIFIED [F31.9] 11/28/2008    Priority: High  . ANXIETY DISORDER [F41.1] 10/13/2006    Priority: High  . Severe episode of recurrent major depressive disorder, without psychotic features ([place]) [F33.2]   . Foreign body in foot, right [S90.851A] 01/09/2015  . Blister of right ankle without infection [S90.521A] 01/09/2015  . Sinusitis [J32.9] 11/26/2014  . Rotator cuff syndrome of right shoulder [M75.101] 11/14/2014  . Varicose veins of both lower extremities with pain [I83.813] 10/29/2014  . Neck pain [M54.2] 09/26/2014  . Metatarsal fracture [S92.309A] 08/16/2014  . Pain in joint, shoulder region [M25.519] 08/16/2014  . UTI (urinary tract infection) [N39.0] 08/16/2014  . Facial skin lesion [L98.9] 08/01/2014  . Leg edema, left [R60.0] 07/25/2014  . Edema of left lower extremity [R60.0] 07/22/2014  . Essential hypertension [I10]   . Thyroid activity decreased [E03.9]   .  HLD (hyperlipidemia) [E78.5]   . Healthcare maintenance [Z00.00] 05/31/2014  . Emotional neurotic disorder [F48.9] 05/07/2014  . Seborrheic keratosis [L82.1] 12/05/2013  . 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 At high risk for falls [R29.6] 06/28/2012  . Allergic rhinitis [J30.9]  06/16/2012  . Hearing loss [389] 01/07/2011  . Foot pain [M79.673] 11/25/2010  . Thyroid nodule [E04.1] 10/16/2010  . URINARY INCONTINENCE, STRESS, FEMALE [N39.3] 04/03/2010  . HYPERLIPIDEMIA [E78.5] 03/25/2010  . PULMONARY NODULE [J98.4] 12/23/2009  . COPD (chronic obstructive pulmonary disease) ([place]) [J44.9] 12/03/2009  . Tobacco abuse counseling [Z71.6] 11/05/2009  . CVA [I63.50] 11/05/2009  . HYPOTHYROIDISM [E03.9] 05/06/2008  . OBSESSIVE-COMPULSIVE DISORDER [F42.9] 10/13/2006  . GERD [K21.9] 10/13/2006  . OSTEOPOROSIS [M81.0] 10/13/2006      Plan Of Care/Follow-up recommendations:  Activity:  as tolerated  Diet:  heart healthy diet  Is patient on multiple antipsychotic therapies at discharge:  No   Has Patient had three or more failed trials of antipsychotic monotherapy by history:  No  Recommended Plan for Multiple Antipsychotic Therapies: NA    Teresa Nicodemus, PMH-NP 04/21/2015, 9:58 AM

## 2015-04-21 NOTE — ED Notes (Signed)
Pt given cab voucher.  Discharge instructions given and reviewed.  RX reviewed.  Pt left ambulatory and all belongings were returned to pt.

## 2015-05-13 ENCOUNTER — Ambulatory Visit (INDEPENDENT_AMBULATORY_CARE_PROVIDER_SITE_OTHER): Payer: Medicare Other | Admitting: Student

## 2015-05-13 ENCOUNTER — Encounter: Payer: Self-pay | Admitting: Student

## 2015-05-13 VITALS — Wt 92.2 lb

## 2015-05-13 DIAGNOSIS — F32A Depression, unspecified: Secondary | ICD-10-CM

## 2015-05-13 DIAGNOSIS — F329 Major depressive disorder, single episode, unspecified: Secondary | ICD-10-CM | POA: Diagnosis present

## 2015-05-13 NOTE — Progress Notes (Signed)
   Subjective:    Patient ID: [name], female    DOB: [birth year], 70 y.o.   MRN: [id number]   CC: ED follow-up for depression  HPI: 70 year old female presenting for ED follow-up for depression and suicidal ideation  Depression - In the emergency room, she seen by psychiatry and she was started on Remeron 15 mg at bedtime, with her Effexor discontinued. - Per report her Effexor was stopped after she was considered to have bipolar disorder during her inpatient stay at [facility] - She was discharged with [facility] follow-up today. But she canceled that appointment in favor of coming to this appointment - She did not continue to take the Remeron because she did not think it was helping her mood. She is unable to tell me for how long she took it - At this time she continues to think about "what if she were no longer alive" but does not want to kill herself - she denies ever making a plan to harm or kill herself - she has no access to guns or large knives  - She reports difficulty sleeping, decreased interest in previously enjoyed activities, guilt that she feels so depressed, she reports decreased energy levels, difficulty concentrating, reduced appetite, she feels as if she moves very slowly  Weight loss - She is very concerned about her reduced appetite and subsequent weight loss - She has lost approximately 20 pounds over the last 3 weeks - She reports that she did not eat today because she did not want to, and if she were to use she would have to force herself - She denies any abdominal pain, nausea, vomiting, physical difficulty eating - She reports that she did not realize that the Remeron was to help her appetite   Review of Systems ROS  otherwise she denies recent illnesses, fevers, nausea, vomiting, diarrhea, shortness of breath, chest pain,  Past Medical, Surgical, Social, and Family History Reviewed & Updated per EMR.   Objective:  Wt 92 lb 3.2  oz (41.822 kg)  LMP 01/09/2012 Vitals and nursing note reviewed  General: NAD Cardiac: RRR,  Respiratory: CTAB, poor inspiratory effort Extremities: no edema or cyanosis. WWP. Skin: warm and dry, no rashes noted Neuro: alert and oriented, no focal deficits Psych: Flat affect, appropriate speech and content   Assessment & Plan:    Depression, major, severe recurrence (HCC) GAD 16 MDQ incompletely filled out PHQ9 23 - Given concern for weight loss and acute Bipolar depression with suicidal ideation she was started on remeron, however she has not been compliant with this. The initial plan had been for her to follow with [facility] for further medication changes - We'll encourage her to start taking Remeron as prescribed - She has a follow-up with [facility] on 2/15 and has been strongly encouraged to keep this appointment - ED precautions and precautions for when to seek help were discussed during the visit     Linetta Regner A. Lincoln Brigham MD, [facility] Resident PGY-2 Pager [phone]

## 2015-05-13 NOTE — Assessment & Plan Note (Addendum)
GAD 16 MDQ incompletely filled out PHQ9 23 - Given concern for weight loss and acute Bipolar depression with suicidal ideation she was started on remeron, however she has not been compliant with this. The initial plan had been for her to follow with [facility] for further medication changes - We'll encourage her to start taking Remeron as prescribed - She has a follow-up with [facility] on 2/15 and has been strongly encouraged to keep this appointment - ED precautions and precautions for when to seek help were discussed during the visit

## 2015-05-13 NOTE — Patient Instructions (Addendum)
Follow-up as scheduled with primary care provider Please start taking the Remeron as prescribed Please follow up with [facility] ans scheduled If he started to feel more depressed, or want to hurt herself or others please seek care by calling one of the phone numbers  listed below  [facility]   [phone]  Provides information on mental health services in [place].   COUNSELING AGENCIES (Accepts Medicaid)  [place] [address]        [id number] *[facility] [address]      [phone]  [facility] [place] (I) [facility] [address]"   [phone] (I) [place] [address]  [phone] [facility] [place] (I) *[facility] [id number] [phone]   [place] [id number] [id number] [facility] [place]         [phone] (I) *[facility] [address]                 [phone] (I) [place]              [phone] (I) *[facility] [place] [address]    [phone] (I) [facility] [address] [facility] [place]                    [id number] (I)*    COUNSELING- CRISIS - 24 hour availability [place]:     [phone] [address], [place], [place] [address]   [facility] of the [facility] [phone] (Domestic Violence, Rape & Victim Assistance )  [place]   [phone] or [phone] [facility] and Crisis Services)  [place]                          Environmental health practitioner Crisis Unit (24/7)             [phone]   [place] National Suicide Hotline    [phone] Diamantina Monks)  [place]   (Only from  8am-4pm)   [phone]

## 2015-05-17 ENCOUNTER — Encounter (HOSPITAL_COMMUNITY): Payer: Self-pay

## 2015-05-17 ENCOUNTER — Emergency Department (HOSPITAL_COMMUNITY)
Admission: EM | Admit: 2015-05-17 | Discharge: 2015-05-21 | Disposition: A | Discharge status: Other Acute Inpatient Hospital | Payer: Medicare Other | Attending: Emergency Medicine | Admitting: Emergency Medicine

## 2015-05-17 ENCOUNTER — Emergency Department (HOSPITAL_COMMUNITY): Payer: Medicare Other

## 2015-05-17 DIAGNOSIS — Z8739 Personal history of other diseases of the musculoskeletal system and connective tissue: Secondary | ICD-10-CM | POA: Insufficient documentation

## 2015-05-17 DIAGNOSIS — Z8719 Personal history of other diseases of the digestive system: Secondary | ICD-10-CM | POA: Insufficient documentation

## 2015-05-17 DIAGNOSIS — J438 Other emphysema: Secondary | ICD-10-CM | POA: Diagnosis not present

## 2015-05-17 DIAGNOSIS — R0602 Shortness of breath: Secondary | ICD-10-CM | POA: Diagnosis not present

## 2015-05-17 DIAGNOSIS — F29 Unspecified psychosis not due to a substance or known physiological condition: Secondary | ICD-10-CM | POA: Diagnosis not present

## 2015-05-17 DIAGNOSIS — I10 Essential (primary) hypertension: Secondary | ICD-10-CM | POA: Insufficient documentation

## 2015-05-17 DIAGNOSIS — Z7951 Long term (current) use of inhaled steroids: Secondary | ICD-10-CM | POA: Diagnosis not present

## 2015-05-17 DIAGNOSIS — F32A Depression, unspecified: Secondary | ICD-10-CM

## 2015-05-17 DIAGNOSIS — Z8711 Personal history of peptic ulcer disease: Secondary | ICD-10-CM | POA: Insufficient documentation

## 2015-05-17 DIAGNOSIS — Z8742 Personal history of other diseases of the female genital tract: Secondary | ICD-10-CM | POA: Diagnosis not present

## 2015-05-17 DIAGNOSIS — F332 Major depressive disorder, recurrent severe without psychotic features: Secondary | ICD-10-CM | POA: Diagnosis present

## 2015-05-17 DIAGNOSIS — E0590 Thyrotoxicosis, unspecified without thyrotoxic crisis or storm: Secondary | ICD-10-CM | POA: Insufficient documentation

## 2015-05-17 DIAGNOSIS — Z049 Encounter for examination and observation for unspecified reason: Secondary | ICD-10-CM

## 2015-05-17 DIAGNOSIS — F419 Anxiety disorder, unspecified: Secondary | ICD-10-CM | POA: Insufficient documentation

## 2015-05-17 DIAGNOSIS — Z7982 Long term (current) use of aspirin: Secondary | ICD-10-CM | POA: Insufficient documentation

## 2015-05-17 DIAGNOSIS — Z79899 Other long term (current) drug therapy: Secondary | ICD-10-CM | POA: Insufficient documentation

## 2015-05-17 DIAGNOSIS — R011 Cardiac murmur, unspecified: Secondary | ICD-10-CM | POA: Diagnosis not present

## 2015-05-17 DIAGNOSIS — F329 Major depressive disorder, single episode, unspecified: Secondary | ICD-10-CM

## 2015-05-17 DIAGNOSIS — F429 Obsessive-compulsive disorder, unspecified: Secondary | ICD-10-CM | POA: Insufficient documentation

## 2015-05-17 DIAGNOSIS — F17210 Nicotine dependence, cigarettes, uncomplicated: Secondary | ICD-10-CM | POA: Diagnosis not present

## 2015-05-17 DIAGNOSIS — R45851 Suicidal ideations: Secondary | ICD-10-CM | POA: Diagnosis not present

## 2015-05-17 DIAGNOSIS — Z880 Allergy status to penicillin: Secondary | ICD-10-CM | POA: Diagnosis not present

## 2015-05-17 LAB — CBC WITH DIFFERENTIAL/PLATELET
Basophils Absolute: 0 10*3/uL (ref 0.0–0.1)
Basophils Relative: 0 %
Eosinophils Absolute: 0 10*3/uL (ref 0.0–0.7)
Eosinophils Relative: 0 %
HCT: 44.5 % (ref 36.0–46.0)
Hemoglobin: 14.7 g/dL (ref 12.0–15.0)
Lymphocytes Relative: 7 %
Lymphs Abs: 0.6 10*3/uL — ABNORMAL LOW (ref 0.7–4.0)
MCH: 32.7 pg (ref 26.0–34.0)
MCHC: 33 g/dL (ref 30.0–36.0)
MCV: 99.1 fL (ref 78.0–100.0)
Monocytes Absolute: 0.6 10*3/uL (ref 0.1–1.0)
Monocytes Relative: 7 %
Neutro Abs: 7.3 10*3/uL (ref 1.7–7.7)
Neutrophils Relative %: 86 %
Platelets: 217 10*3/uL (ref 150–400)
RBC: 4.49 MIL/uL (ref 3.87–5.11)
RDW: 14.2 % (ref 11.5–15.5)
WBC: 8.6 10*3/uL (ref 4.0–10.5)

## 2015-05-17 LAB — COMPREHENSIVE METABOLIC PANEL
ALT: 9 U/L — ABNORMAL LOW (ref 14–54)
AST: 14 U/L — ABNORMAL LOW (ref 15–41)
Albumin: 3.7 g/dL (ref 3.5–5.0)
Alkaline Phosphatase: 85 U/L (ref 38–126)
Anion gap: 10 (ref 5–15)
BUN: 15 mg/dL (ref 6–20)
CO2: 33 mmol/L — ABNORMAL HIGH (ref 22–32)
Calcium: 9.4 mg/dL (ref 8.9–10.3)
Chloride: 100 mmol/L — ABNORMAL LOW (ref 101–111)
Creatinine, Ser: 0.76 mg/dL (ref 0.44–1.00)
GFR calc Af Amer: >60 mL/min (ref >60)
GFR calc non Af Amer: >60 mL/min (ref >60)
Glucose, Bld: 101 mg/dL — ABNORMAL HIGH (ref 65–99)
Potassium: 3.4 mmol/L — ABNORMAL LOW (ref 3.5–5.1)
Sodium: 143 mmol/L (ref 135–145)
Total Bilirubin: 0.8 mg/dL (ref 0.3–1.2)
Total Protein: 6.4 g/dL — ABNORMAL LOW (ref 6.5–8.1)

## 2015-05-17 LAB — SALICYLATE LEVEL: Salicylate Lvl: <4 mg/dL (ref 2.8–30.0)

## 2015-05-17 LAB — ETHANOL: Alcohol, Ethyl (B): <5 mg/dL (ref <5)

## 2015-05-17 LAB — RAPID URINE DRUG SCREEN, HOSP PERFORMED
Amphetamines: NOT DETECTED
Barbiturates: NOT DETECTED
Benzodiazepines: NOT DETECTED
Cocaine: NOT DETECTED
Opiates: NOT DETECTED
Tetrahydrocannabinol: NOT DETECTED

## 2015-05-17 LAB — ACETAMINOPHEN LEVEL: Acetaminophen (Tylenol), Serum: <10 ug/mL — ABNORMAL LOW (ref 10–30)

## 2015-05-17 LAB — I-STAT TROPONIN, ED: Troponin i, poc: 0.01 ng/mL (ref 0.00–0.08)

## 2015-05-17 MED ORDER — PREDNISONE 20 MG PO TABS
60.0000 mg | ORAL_TABLET | Freq: Every day | ORAL | Status: DC
  Administered 2015-05-18 – 2015-05-21 (×4): 60 mg via ORAL
  Filled 2015-05-17 (×4): qty 3

## 2015-05-17 MED ORDER — ASPIRIN 325 MG PO TABS
325.0000 mg | ORAL_TABLET | Freq: Every day | ORAL | Status: DC | PRN
  Administered 2015-05-18: 325 mg via ORAL
  Filled 2015-05-17 (×2): qty 1

## 2015-05-17 MED ORDER — MOMETASONE FURO-FORMOTEROL FUM 200-5 MCG/ACT IN AERO
2.0000 | INHALATION_SPRAY | Freq: Two times a day (BID) | RESPIRATORY_TRACT | Status: DC
  Administered 2015-05-18: 2 via RESPIRATORY_TRACT
  Filled 2015-05-17 (×2): qty 8.8

## 2015-05-17 MED ORDER — METHYLPREDNISOLONE SODIUM SUCC 125 MG IJ SOLR
125.0000 mg | Freq: Once | INTRAMUSCULAR | Status: AC
  Administered 2015-05-17: 125 mg via INTRAVENOUS
  Filled 2015-05-17: qty 2

## 2015-05-17 MED ORDER — LORAZEPAM 1 MG PO TABS
1.0000 mg | ORAL_TABLET | ORAL | Status: DC | PRN
  Administered 2015-05-17: 1 mg via ORAL
  Filled 2015-05-17: qty 1

## 2015-05-17 MED ORDER — LISINOPRIL 5 MG PO TABS
5.0000 mg | ORAL_TABLET | Freq: Every day | ORAL | Status: DC
Start: 2015-05-17 — End: 2015-05-21
  Administered 2015-05-17 – 2015-05-21 (×5): 5 mg via ORAL
  Filled 2015-05-17 (×6): qty 1

## 2015-05-17 MED ORDER — ATORVASTATIN CALCIUM 40 MG PO TABS
40.0000 mg | ORAL_TABLET | Freq: Every day | ORAL | Status: DC
  Administered 2015-05-17 – 2015-05-21 (×5): 40 mg via ORAL
  Filled 2015-05-17 (×6): qty 1

## 2015-05-17 MED ORDER — LEVOTHYROXINE SODIUM 75 MCG PO TABS
75.0000 ug | ORAL_TABLET | Freq: Every day | ORAL | Status: DC
  Administered 2015-05-17 – 2015-05-21 (×5): 75 ug via ORAL
  Filled 2015-05-17 (×7): qty 1

## 2015-05-17 MED ORDER — ALBUTEROL SULFATE HFA 108 (90 BASE) MCG/ACT IN AERS: 1.0000 | INHALATION_SPRAY | RESPIRATORY_TRACT | Status: DC | PRN

## 2015-05-17 NOTE — ED Notes (Signed)
Pt at belongings wanded by security.

## 2015-05-17 NOTE — ED Notes (Signed)
Pt placed on 2L of O2 r/t pt's O2 sat of 88% on RA upon arrival.

## 2015-05-17 NOTE — ED Provider Notes (Signed)
CSN: [id number]     Arrival date & time 05/17/15  1155 History   First MD Initiated Contact with Patient 05/17/15 1218     Chief Complaint  Patient presents with  . Suicidal     (Consider location/radiation/quality/duration/timing/severity/associated sxs/prior Treatment) The history is provided by the patient.  [name] is a 70 y.o. female hx of depression, COPD, here with shortness of breath, suicidal ideation. Patient states that she has been depressed for several months. She was seen in the ER about a month ago and was seen by psychiatry and recommended remeron. States that she has been taking it but ran out a couple days ago. States that the medication didn't help her depression and she plans on overdosing on her medications. She state that she is scared to go outside or go anywhere. She denies hallucinations. She has more cough for the last week and some shortness of breath. She is still smoking.    Past Medical History  Diagnosis Date  . OCD (obsessive compulsive disorder)   . Depression   . Anxiety   . PUD (peptic ulcer disease) 09/2006    H pylori  . Diverticulosis of colon   . GERD (gastroesophageal reflux disease)   . COPD (chronic obstructive pulmonary disease) (HCC)     Due to long history of tobacco abuse, still smoking  . History of uterine prolapse 10/2004    Dr Cletis Media  . Thyroid disease     Ho R thyroid noduel plus mild hyperthyroidism. Treated with radioiodine therapty on 04/2006. Dr Estrella Myrtle.  [name] Heart murmur   . Osteoporosis   . Hypertension    Past Surgical History  Procedure Laterality Date  . Colonoscopy  06/03/2005    Hyperplastic polyps, sigmoid diverticulosis, internal hemorroids  . Ganglion removal  12/2005    R wrist subretinacular dorsal ganglion  . Biopsy thyroid  08/2008    Atypia and Hurtle cells, partial thyroidectomy   . Tonsillectomy and adenoidectomy  10/2004  . Pft  11/2006    Obstructive pattern.  Poor response to bronchodilators.   .  Thyroidectomy, partial  08/2008    Dr Ronnald Collum  . Transthoracic echocardiogram  123456    Normal systolic function.  EF 55-60%.  Mild MR, atria ok, trivial pericardial effusion  . Carotid ultrasound  09/10/09    No significant extracranial carotid artery stenosis, vertebrals are patent with antegrade flow.   . Mri head  09/20/09    No acute intracranial abn.  Signal abnormality suggestive of chronic small vessel ischemia, maximal in the right subinsular white and deep gray mater.   . Tvt  06/15/10    Tension-free Vaginal Tape, Dr Mancel Bale, for urge incontinence  . Abdominal hysterectomy    . Bladder suspension      mesh sling  . Foreign body removal Right 02/21/2015    Procedure: RIGHT FOOT FOREIGN BODY REMOVAL ;  Surgeon: Marchia Bond, MD;  Location: [facility];  Service: Orthopedics;  Laterality: Right;   Family History  Problem Relation Age of Onset  . Heart disease Mother   . Heart disease Maternal Grandmother   . Heart disease Maternal Grandfather   . Cirrhosis Sister   . Hypertension Sister   . Other Neg Hx    Social History  Substance Use Topics  . Smoking status: Current Every Day Smoker -- 0.25 packs/day for 35 years    Types: Cigarettes  . Smokeless tobacco: Never Used     Comment: smokes less  than 1 pack per week. ready to quitt.  . Alcohol Use: No   OB History    Gravida Para Term Preterm AB TAB SAB Ectopic Multiple Living   2         2     Review of Systems  Respiratory: Positive for shortness of breath.   Psychiatric/Behavioral: Positive for dysphoric mood.  All other systems reviewed and are negative.     Allergies  Seroquel; Amoxicillin; Ciprofloxacin; Haloperidol lactate; Ketorolac tromethamine; Latuda; Thorazine; and Wellbutrin  Home Medications   Prior to Admission medications   Medication Sig Start Date End Date Taking? Authorizing Provider  levothyroxine (SYNTHROID, LEVOTHROID) 75 MCG tablet TAKE 1 TABLET BY MOUTH DAILY FOR  THYROID 09/04/14  Yes Frazier Richards, MD  lisinopril (PRINIVIL,ZESTRIL) 5 MG tablet TAKE 1 TABLET (5 MG TOTAL) BY MOUTH DAILY. 09/04/14  Yes Frazier Richards, MD  vitamin E 400 UNIT capsule Take 2 capsules (800 Units total) by mouth daily. 09/26/13  Yes [name], NP  albuterol (PROVENTIL HFA;VENTOLIN HFA) 108 (90 BASE) MCG/ACT inhaler Inhale 2 puffs into the lungs every 6 (six) hours as needed for wheezing or shortness of breath. 06/26/14   Timmothy Euler, MD  aspirin 325 MG tablet Take 325 mg by mouth daily as needed for mild pain, moderate pain or headache.     Historical Provider, MD  atorvastatin (LIPITOR) 40 MG tablet Take 1 tablet (40 mg total) by mouth daily. 06/15/14   Frazier Richards, MD  fluticasone-salmeterol (ADVAIR HFA) [phone] MCG/ACT inhaler Inhale 2 puffs into the lungs 2 (two) times daily. 06/26/14   Timmothy Euler, MD  loratadine (CLARITIN) 10 MG tablet Take 1 tablet (10 mg total) by mouth daily. Patient not taking: Reported on 05/17/2015 12/04/14   Rosemarie Ax, MD  meloxicam (MOBIC) 15 MG tablet Take 1 tablet (15 mg total) by mouth daily. Patient not taking: Reported on 03/31/2015 08/16/14   Frazier Richards, MD  methocarbamol (ROBAXIN) 500 MG tablet Take 1 tablet (500 mg total) by mouth 2 (two) times daily as needed for muscle spasms. Patient not taking: Reported on 03/31/2015 11/16/14   Waynetta Pean, PA-C  mirtazapine (REMERON) 15 MG tablet Take 1 tablet (15 mg total) by mouth at bedtime. 04/21/15   [name], NP  traMADol (ULTRAM) 50 MG tablet Take 1 tablet (50 mg total) by mouth every 6 (six) hours as needed. Patient not taking: Reported on 03/31/2015 02/21/15   Marchia Bond, MD   BP 150/95 mmHg  Pulse 83  Temp(Src) 98.4 F (36.9 C) (Oral)  Resp 22  SpO2 99%  LMP 01/09/2012 Physical Exam  Constitutional: She is oriented to person, place, and time.  Depressed   HENT:  Head: Normocephalic.  Mouth/Throat: Oropharynx is clear and moist.  Eyes: Conjunctivae are  normal. Pupils are equal, round, and reactive to light.  Neck: Normal range of motion. Neck supple.  Cardiovascular: Normal rate, regular rhythm and normal heart sounds.   Pulmonary/Chest: Effort normal and breath sounds normal.  Minimal wheezing throughout. No retractions   Abdominal: Soft. Bowel sounds are normal. She exhibits no distension. There is no tenderness. There is no rebound.  Musculoskeletal: Normal range of motion. She exhibits no edema or tenderness.  Neurological: She is alert and oriented to person, place, and time. No cranial nerve deficit. Coordination normal.  Skin: Skin is warm and dry.  Psychiatric: She has a normal mood and affect. Her behavior is normal. Judgment and thought  content normal.  Nursing note and vitals reviewed.   ED Course  Procedures (including critical care time) Labs Review Labs Reviewed  CBC WITH DIFFERENTIAL/PLATELET - Abnormal; Notable for the following:    Lymphs Abs 0.6 (*)    All other components within normal limits  COMPREHENSIVE METABOLIC PANEL - Abnormal; Notable for the following:    Potassium 3.4 (*)    Chloride 100 (*)    CO2 33 (*)    Glucose, Bld 101 (*)    Total Protein 6.4 (*)    AST 14 (*)    ALT 9 (*)    All other components within normal limits  ACETAMINOPHEN LEVEL - Abnormal; Notable for the following:    Acetaminophen (Tylenol), Serum <10 (*)    All other components within normal limits  ETHANOL  SALICYLATE LEVEL  URINE RAPID DRUG SCREEN, HOSP PERFORMED  I-STAT TROPOININ, ED    Imaging Review Dg Chest 2 View  05/17/2015  CLINICAL DATA:  70 year old female with acute shortness of breath and chest pain today. EXAM: CHEST  2 VIEW COMPARISON:  04/19/2015 and prior radiographs is FINDINGS: The cardiomediastinal silhouette is unremarkable. COPD/ emphysema identified. There is no evidence of focal airspace disease, pulmonary edema, suspicious pulmonary nodule/mass, pleural effusion, or pneumothorax. No acute bony  abnormalities are identified. IMPRESSION: COPD/emphysema without evidence of acute cardiopulmonary disease. Electronically Signed   By: Margarette [place] M.D.   On: 05/17/2015 13:43   I have personally reviewed and evaluated these images and lab results as part of my medical decision-making.   EKG Interpretation   Date/Time:  Saturday May 17 2015 12:38:52 EST Ventricular Rate:  103 PR Interval:  139 QRS Duration: 86 QT Interval:  343 QTC Calculation: 449 R Axis:   83 Text Interpretation:  Sinus tachycardia Ventricular premature complex  Aberrant conduction of SV complex(es) Right atrial enlargement Borderline  right axis deviation Repol abnrm suggests ischemia, diffuse leads No  significant change since last tracing Confirmed by YAO  MD, DAVID ([id number])  on 05/17/2015 1:08:47 PM      MDM   Final diagnoses:  Disease ruled out after examination   SPOORTHI TRIMBOLI is a 70 y.o. female here with depression, hypoxia. She is depressed with some suicidal ideation. She was noted to be hypoxic 88% on RA. She states that she is not on oxygen at home but I noticed that she was also hypoxic 88% during last visit. With 2 L, oxygen improved to 92%. I think likely mild COPD exacerbation as well but she doesn't want nebs but ok with steroids. Will check labs, tox, CXR. Will give steroids and reassess.   2:48 PM CXR clear. Oxygen improved with steroids. Labs showed bicarb 33 likely from COPD. TTS saw patient, recommend inpatient admission. Will restart home meds and add 5 days of steroids and albuterol as needed and put on oxygen as needed. Medically cleared.    Wandra Arthurs, MD 05/17/15 [phone]

## 2015-05-17 NOTE — ED Notes (Signed)
Pt presents from home via EMS from home. Pt reported to EMS that she is scared to go outside, scared to drive, scared to do anything. Pt reports that she has been depressed. Pt has been on medication for her depression but reported that it is not working and that she ran out a few days ago. Pt reports she just wants to be in the mental hospital and does not want to be released. EMS reports that pt reported that in terms of SI, she has thought about taking a bunch of pills and just "ending it". Per EMS, pt is slightly agitated but cooperative.
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
## 2015-05-17 NOTE — ED Notes (Signed)
Bed: [id number] Expected date:  Expected time:  Means of arrival:  Comments: EMS- 70yo SI, not feeling well

## 2015-05-17 NOTE — ED Notes (Signed)
MD at bedside. 

## 2015-05-17 NOTE — Progress Notes (Signed)
Disposition CSW completed patient referrals to the following Gero-Psych inpatient facilities:  [facility]  CSW will continue to follow patient for placement needs.  [place] Disposition CSW [phone]

## 2015-05-17 NOTE — ED Notes (Signed)
Called staffing office to obtain sitter for SI complaint

## 2015-05-18 DIAGNOSIS — R45851 Suicidal ideations: Secondary | ICD-10-CM

## 2015-05-18 DIAGNOSIS — F332 Major depressive disorder, recurrent severe without psychotic features: Secondary | ICD-10-CM

## 2015-05-18 MED ORDER — IBUPROFEN 200 MG PO TABS
600.0000 mg | ORAL_TABLET | Freq: Four times a day (QID) | ORAL | Status: DC | PRN
  Administered 2015-05-18: 600 mg via ORAL
  Filled 2015-05-18: qty 3

## 2015-05-18 MED ORDER — DIPHENHYDRAMINE HCL 25 MG PO CAPS
25.0000 mg | ORAL_CAPSULE | Freq: Once | ORAL | Status: AC
  Administered 2015-05-18: 25 mg via ORAL
  Filled 2015-05-18: qty 1

## 2015-05-18 MED ORDER — MUSCLE RUB 10-15 % EX CREA
TOPICAL_CREAM | CUTANEOUS | Status: DC | PRN
Start: 2015-05-18 — End: 2015-05-21
  Administered 2015-05-18: 1 via TOPICAL
  Administered 2015-05-18: 07:00:00 via TOPICAL
  Administered 2015-05-18: 1 via TOPICAL
  Administered 2015-05-19: 15:00:00 via TOPICAL
  Filled 2015-05-18 (×2): qty 85

## 2015-05-18 MED ORDER — FLUOXETINE HCL 20 MG PO CAPS
20.0000 mg | ORAL_CAPSULE | Freq: Every day | ORAL | Status: DC
  Administered 2015-05-18 – 2015-05-21 (×4): 20 mg via ORAL
  Filled 2015-05-18 (×4): qty 1
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
## 2015-05-19 IMAGING — CR DG FOOT COMPLETE 3+V*L*
3 series · 3 of 3 positions shown · non-contrast
Comparison: None.

CLINICAL DATA: Left foot pain

EXAM:
LEFT FOOT - COMPLETE 3+ VIEW

[foot ap]
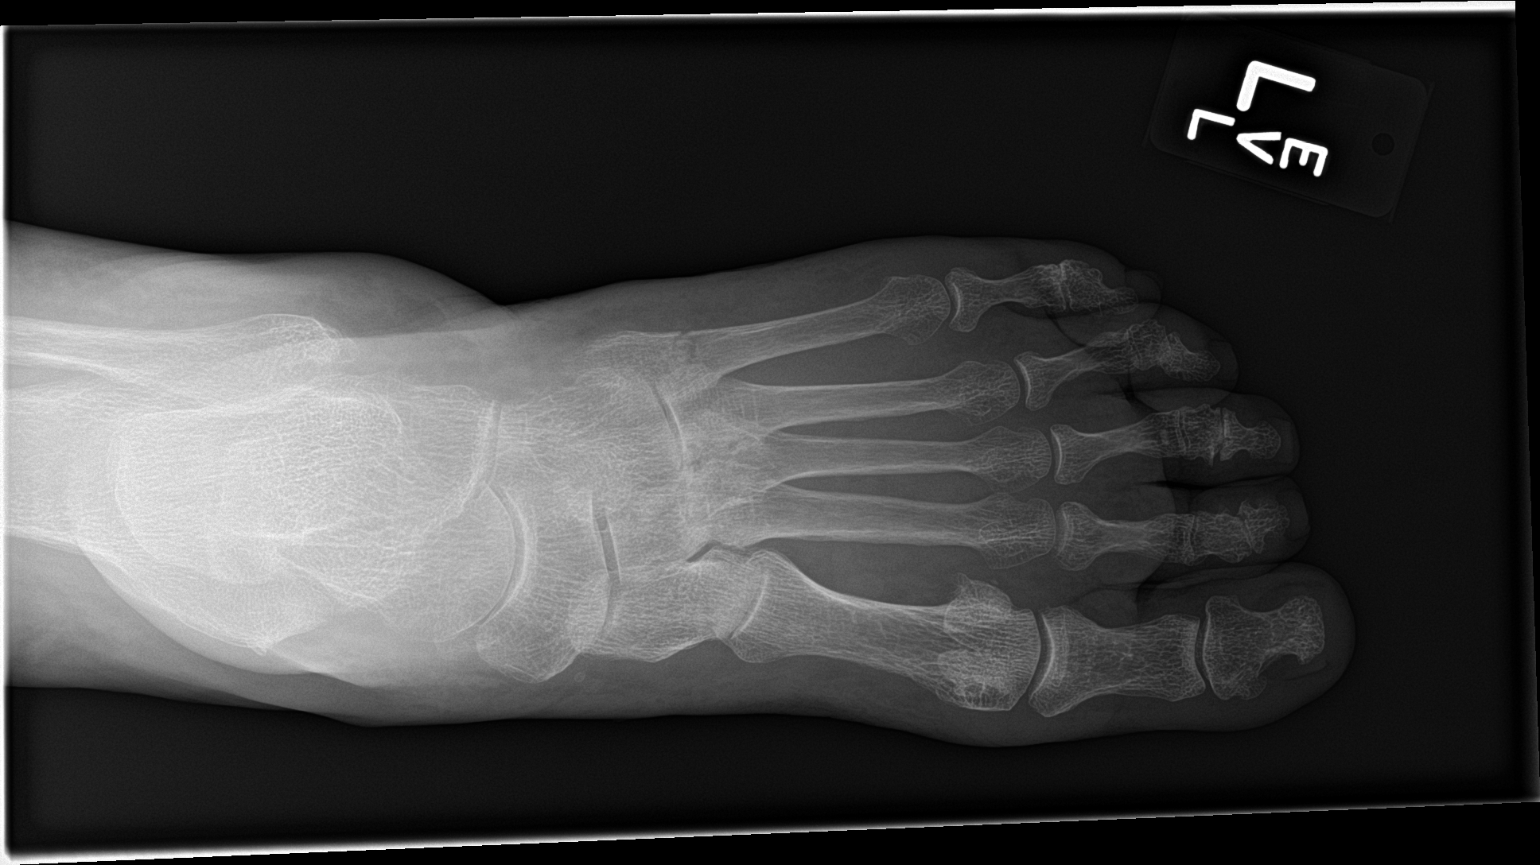

[foot obl]
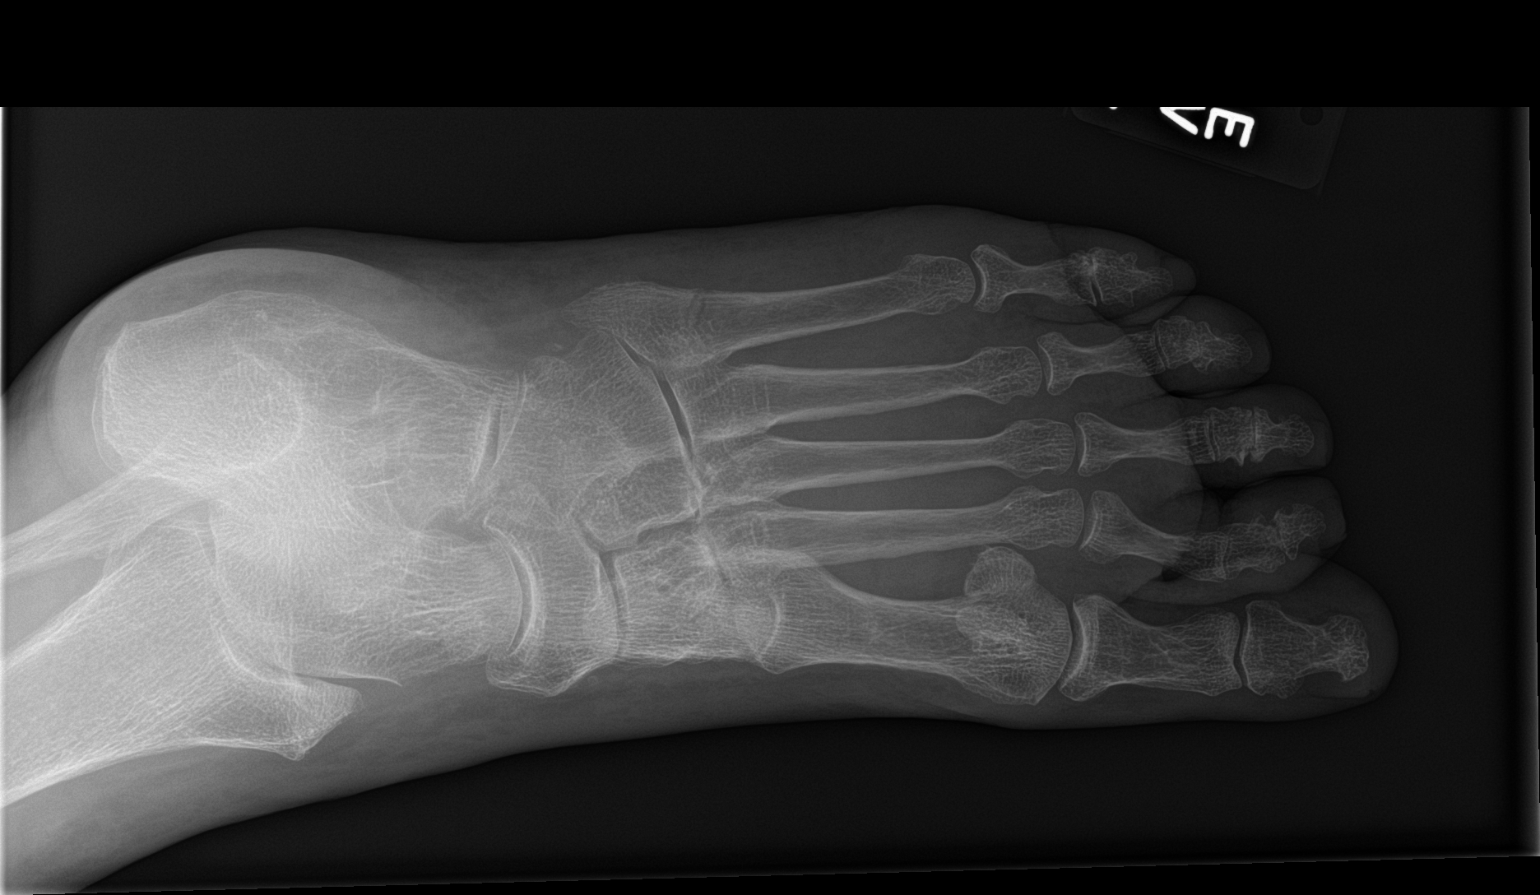

[foot lat]
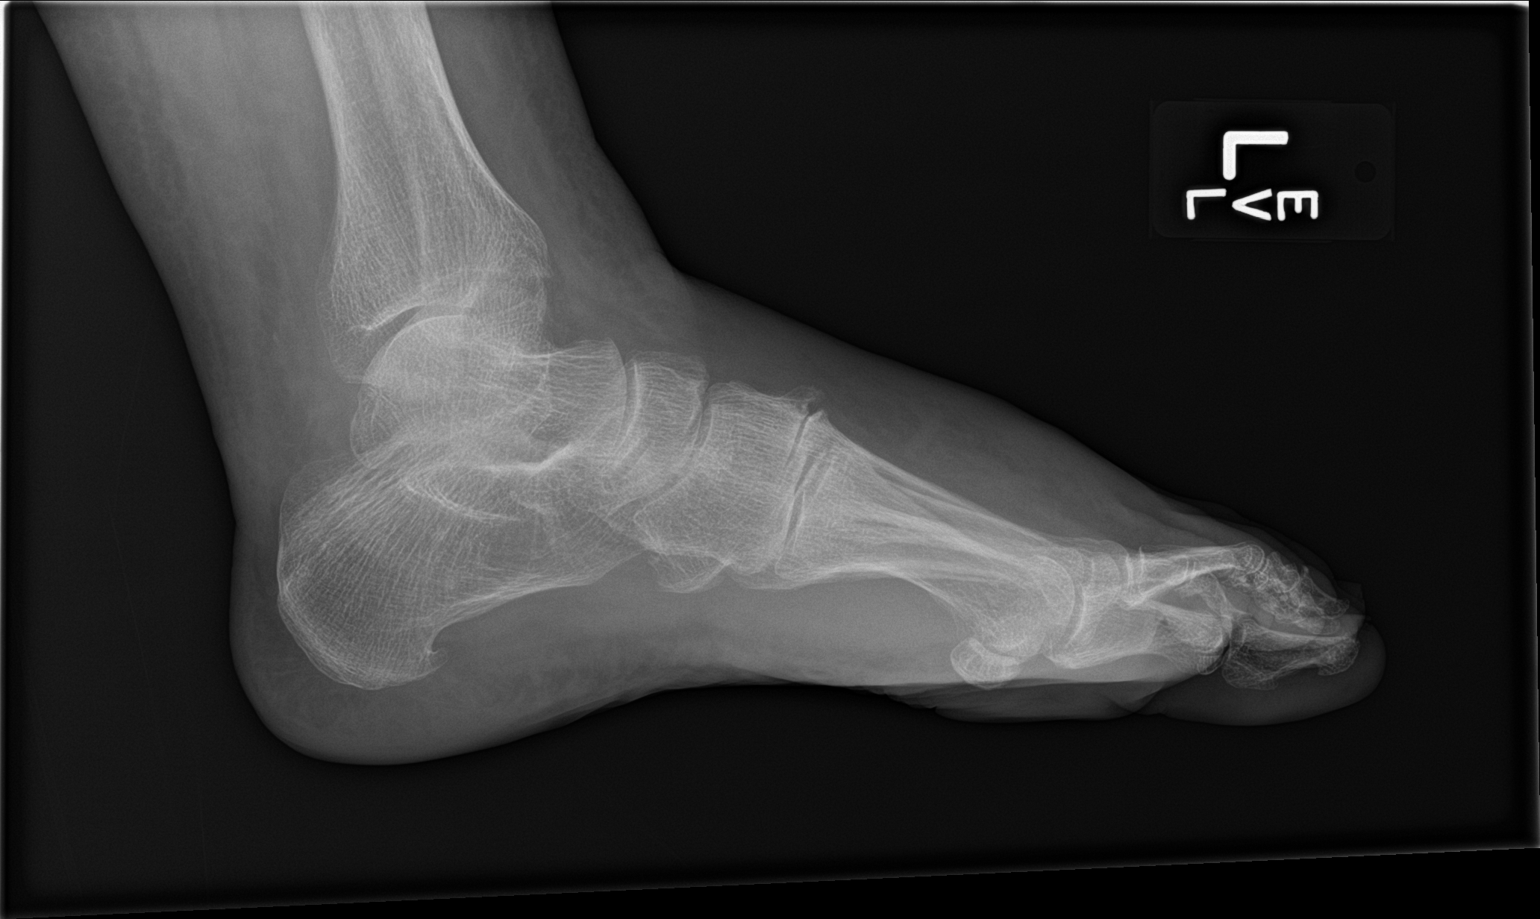

[3 of 3 positions shown; findings below may reference images not displayed]

FINDINGS: Nondisplaced fracture involving the proximal shaft of the 5th
metatarsal.

Overlying soft tissue swelling.

The joint spaces are preserved.
IMPRESSION: Nondisplaced fracture involving the proximal 5th metatarsal.

## 2015-05-19 MED ORDER — POLYETHYLENE GLYCOL 3350 17 G PO PACK
17.0000 g | PACK | Freq: Every day | ORAL | Status: DC
  Administered 2015-05-20: 17 g via ORAL
  Filled 2015-05-19 (×4): qty 1

## 2015-05-19 MED ORDER — DIPHENHYDRAMINE HCL 25 MG PO CAPS
25.0000 mg | ORAL_CAPSULE | Freq: Once | ORAL | Status: AC
  Administered 2015-05-19: 25 mg via ORAL
  Filled 2015-05-19: qty 1

## 2015-05-19 MED ORDER — DOCUSATE SODIUM 100 MG PO CAPS
100.0000 mg | ORAL_CAPSULE | Freq: Every day | ORAL | Status: DC
  Administered 2015-05-20 – 2015-05-21 (×2): 100 mg via ORAL
  Filled 2015-05-19 (×3): qty 1

## 2015-05-19 NOTE — BH Assessment (Signed)
Pt is cooperative. She reports she didn't sleep well and says her appetite is poor. Pt endorses SI. She says she is "weak when I walk" and thinks she may need an IV. She denies HI and denies [facility]. Writer notified pt that TTS is still seeking inpatient placement. Writer requests to go to [facility].  Arnold Long, [place] Therapeutic Triage Specialist

## 2015-05-19 NOTE — ED Notes (Addendum)
Pt states she feels very weak and does not have much of an appetite. Abd soft and nontender. Pt does have a sitter. Pt stated she has been feeling depressed for awhile. She does contract for safety. Spoke with EDP concerning  pts complaints of feeling weak. (10am )pt spoke with her sister and stated,"I am dying." Reassured the pt she was stable and was not dying. Pt is requesting trazadone for sleep tonight . Will discuss with psychiatry. Pt remains a 1:1 with a sitter. She denies Si and HI at this time and does contract for safety. Pt decided she wanted to shower( 12:30pm)Pt sister phoned and stated she wanted someone to address the pts OCD. She stated the pt is a hoarder and will not take any showers. She will only sit on the side of the bathtub and wash up. Pt was very upset when we had housekeeping clean the bathroom prior to her shower. 5pm Phoned AC to see if another sitter could be sent. Oxygen taken off the pt to see what her SAT is on RA. [facility] from [place] , [place] called to inquire about the pt. 5:30pm pts oxygen on room air is 86%. Pt placed back on 2 liters nasal canula. 5:30pm -EDP made aware of pts oxygen level,5:50pm EDP came to evaluate the pt. Pt does have COPD. 7pm Pt only ate the Mac and cheese for dinner. Report to Margaretha Sheffield , RN 7pm Tech phoned [facility] to make her aware of the sitter situation.

## 2015-05-19 NOTE — BH Assessment (Signed)
[facility] Assessment Progress Note  The following facilities have been contacted to seek placement for this pt, with results as noted:  Beds available, information sent, decision pending:  [place]:  [facility] [place], [place] Triage Specialist [phone]

## 2015-05-19 NOTE — ED Notes (Signed)
Unable to administer 0800 medications at this time due to not having medications. Request sent to pharmacy.

## 2015-05-19 NOTE — ED Notes (Signed)
Pt requesting benadryl for sleep.  Informed Dr. Zenia Resides.  VO received.

## 2015-05-19 NOTE — Progress Notes (Addendum)
Entered in d/c instructions  [name] Go on 05/21/2015 You have a scheduled appt on 05/21/15 with Dr Sherril Cong for follow up of your depression [place] [id number] [phone]  ED CM sent her pcp an EPIC in basket high importance message about pt ED visit and possible admission

## 2015-05-20 DIAGNOSIS — F329 Major depressive disorder, single episode, unspecified: Secondary | ICD-10-CM | POA: Insufficient documentation

## 2015-05-20 DIAGNOSIS — F332 Major depressive disorder, recurrent severe without psychotic features: Secondary | ICD-10-CM | POA: Diagnosis not present

## 2015-05-20 DIAGNOSIS — F32A Depression, unspecified: Secondary | ICD-10-CM | POA: Insufficient documentation

## 2015-05-20 DIAGNOSIS — R45851 Suicidal ideations: Secondary | ICD-10-CM | POA: Diagnosis not present

## 2015-05-20 MED ORDER — LORAZEPAM 1 MG PO TABS
1.0000 mg | ORAL_TABLET | Freq: Once | ORAL | Status: AC
  Administered 2015-05-20: 1 mg via ORAL
  Filled 2015-05-20: qty 1

## 2015-05-20 NOTE — BH Assessment (Signed)
[facility] Assessment Progress Note  Per Corena Pilgrim, MD, this pt requires psychiatric hospitalization at this time.  At 14:15 Shirlee Limerick calls from [facility].  Pt has been accepted to their facility by Geanie Kenning, MD.  However, they cannot receive pt until tomorrow (05/21/2015) after 08:00.  This has been staffed with Charmaine Downs, NP, who concurs with this decision.  Shirlee Limerick reports that she will fax blank Consent for Admission form to [phone] for pt to sign.  When pt is ready for transfer, please call report to [phone].  Jalene Mullet, [place] Triage Specialist [phone]

## 2015-05-20 NOTE — ED Notes (Signed)
Pt awake and requesting door be closed.  Reiterated to patient door must remain open.

## 2015-05-20 NOTE — BHH Counselor (Signed)
Referral availability 05/20/15  At Capacity:  [facility] Cella: Per Dewitt Hoes: [place]: No availability [place]: Per Elissa Hefty: Per Alyse Low: per Silva Bandy  Referral sent for review: [facility] K. Glendon Axe, [facility]  Counselor 05/20/2015 11:54 AM

## 2015-05-20 NOTE — Consult Note (Signed)
Psychiatric Specialty Exam: Physical Exam  ROS  Blood pressure 114/68, pulse 74, temperature 98.3 F (36.8 C), temperature source Oral, resp. rate 18, last menstrual period 01/09/2012, SpO2 94 %.There is no weight on file to calculate BMI.  General Appearance: Casual and Fairly Groomed  Engineer, water:: Good  Speech: Clear and Coherent  Volume: Normal  Mood: Anxious, Depressed and Hopeless  Affect: Constricted and Depressed  Thought Process: Goal Directed  Orientation: Full (Time, Place, and Person)  Thought Content: Rumination  Suicidal Thoughts: Yes. with intent/plan  Homicidal Thoughts: No  Memory: Immediate; Good Recent; Fair Remote; Fair  Judgement: Poor  Insight: Lacking  Psychomotor Activity: Decreased  Concentration: Fair  Recall: [employer] of Knowledge:Good  Language: Good  Akathisia: No  Handed: Right  AIMS (if indicated):    Assets: Communication Skills Desire for Improvement Resilience Social Support  ADL's: Intact  Cognition: WNL          Evaluated patient today who is still waiting for inpatient hospitalization for treatment of depression.  Patient remains depressed and has no appetite for food.  She reports poor sleep.   Patient denies HI/AVH.  She has been accepted at [facility] and will be transported in the morning to the hospital.  Severe episode of recurrent major depressive disorder, without psychotic features ([place])   Plan:  Continue seeking placement at a [facility] unit.  Continue to monitor and provide safety.  Charmaine Downs   PMHNP-BC   Patient seen face-to-face for psychiatric evaluation, chart reviewed and case discussed with the physician extender and developed treatment plan. Reviewed the information documented and agree with the treatment plan. Corena Pilgrim, MD

## 2015-05-20 NOTE — BHH Counselor (Signed)
Voluntary consent faxed to [place] [phone] on 05/20/15 Caeleb Batalla K. Glendon Axe, [facility]  Counselor 05/20/2015 6:57 PM

## 2015-05-20 NOTE — ED Notes (Addendum)
Pt is asleep with a sitter at the bedside. Pt remains on 2 liters nasal canula for her COPD. Pt appears comfortable at this time. Pt is requesting a stool softener. Will notify MD.Pt OOB in the cardiac chair. Given an incentive spirmeter and instructed to use it every hour while awake. Pt is able to get up to 250. Pt refused her miralax and her dulera. Pt remains with a sitter. Pt was informed by NP she will be transported at 8am tomorrow to [facility]. Report to [employer].

## 2015-05-21 ENCOUNTER — Ambulatory Visit: Payer: Self-pay | Admitting: Family Medicine

## 2015-05-21 DIAGNOSIS — F17210 Nicotine dependence, cigarettes, uncomplicated: Secondary | ICD-10-CM | POA: Diagnosis present

## 2015-05-21 DIAGNOSIS — I129 Hypertensive chronic kidney disease with stage 1 through stage 4 chronic kidney disease, or unspecified chronic kidney disease: Secondary | ICD-10-CM | POA: Diagnosis present

## 2015-05-21 DIAGNOSIS — J439 Emphysema, unspecified: Secondary | ICD-10-CM | POA: Diagnosis present

## 2015-05-21 DIAGNOSIS — Z79899 Other long term (current) drug therapy: Secondary | ICD-10-CM | POA: Diagnosis not present

## 2015-05-21 DIAGNOSIS — E559 Vitamin D deficiency, unspecified: Secondary | ICD-10-CM | POA: Diagnosis not present

## 2015-05-21 DIAGNOSIS — F429 Obsessive-compulsive disorder, unspecified: Secondary | ICD-10-CM | POA: Diagnosis not present

## 2015-05-21 DIAGNOSIS — J438 Other emphysema: Secondary | ICD-10-CM | POA: Diagnosis not present

## 2015-05-21 DIAGNOSIS — E039 Hypothyroidism, unspecified: Secondary | ICD-10-CM | POA: Diagnosis present

## 2015-05-21 DIAGNOSIS — N182 Chronic kidney disease, stage 2 (mild): Secondary | ICD-10-CM | POA: Diagnosis not present

## 2015-05-21 DIAGNOSIS — Z7952 Long term (current) use of systemic steroids: Secondary | ICD-10-CM | POA: Diagnosis not present

## 2015-05-21 DIAGNOSIS — E876 Hypokalemia: Secondary | ICD-10-CM | POA: Diagnosis not present

## 2015-05-21 DIAGNOSIS — J449 Chronic obstructive pulmonary disease, unspecified: Secondary | ICD-10-CM | POA: Diagnosis not present

## 2015-05-21 DIAGNOSIS — Z888 Allergy status to other drugs, medicaments and biological substances status: Secondary | ICD-10-CM | POA: Diagnosis not present

## 2015-05-21 DIAGNOSIS — Z8673 Personal history of transient ischemic attack (TIA), and cerebral infarction without residual deficits: Secondary | ICD-10-CM | POA: Diagnosis not present

## 2015-05-21 DIAGNOSIS — F3130 Bipolar disorder, current episode depressed, mild or moderate severity, unspecified: Secondary | ICD-10-CM | POA: Diagnosis not present

## 2015-05-21 DIAGNOSIS — Z880 Allergy status to penicillin: Secondary | ICD-10-CM | POA: Diagnosis not present

## 2015-05-21 DIAGNOSIS — R630 Anorexia: Secondary | ICD-10-CM | POA: Diagnosis present

## 2015-05-21 DIAGNOSIS — E870 Hyperosmolality and hypernatremia: Secondary | ICD-10-CM | POA: Diagnosis not present

## 2015-05-21 DIAGNOSIS — F329 Major depressive disorder, single episode, unspecified: Secondary | ICD-10-CM | POA: Diagnosis not present

## 2015-05-21 DIAGNOSIS — K219 Gastro-esophageal reflux disease without esophagitis: Secondary | ICD-10-CM | POA: Diagnosis present

## 2015-05-21 DIAGNOSIS — Z7951 Long term (current) use of inhaled steroids: Secondary | ICD-10-CM | POA: Diagnosis not present

## 2015-05-21 DIAGNOSIS — F312 Bipolar disorder, current episode manic severe with psychotic features: Secondary | ICD-10-CM | POA: Diagnosis not present

## 2015-05-21 DIAGNOSIS — E639 Nutritional deficiency, unspecified: Secondary | ICD-10-CM | POA: Diagnosis not present

## 2015-05-21 DIAGNOSIS — E0590 Thyrotoxicosis, unspecified without thyrotoxic crisis or storm: Secondary | ICD-10-CM | POA: Diagnosis not present

## 2015-05-21 DIAGNOSIS — F332 Major depressive disorder, recurrent severe without psychotic features: Secondary | ICD-10-CM | POA: Diagnosis not present

## 2015-05-21 DIAGNOSIS — F314 Bipolar disorder, current episode depressed, severe, without psychotic features: Secondary | ICD-10-CM | POA: Diagnosis present

## 2015-05-21 DIAGNOSIS — Z7982 Long term (current) use of aspirin: Secondary | ICD-10-CM | POA: Diagnosis not present

## 2015-05-21 DIAGNOSIS — R011 Cardiac murmur, unspecified: Secondary | ICD-10-CM | POA: Diagnosis not present

## 2015-05-21 DIAGNOSIS — M25552 Pain in left hip: Secondary | ICD-10-CM | POA: Diagnosis not present

## 2015-05-21 DIAGNOSIS — M7541 Impingement syndrome of right shoulder: Secondary | ICD-10-CM | POA: Diagnosis present

## 2015-05-21 DIAGNOSIS — Z20828 Contact with and (suspected) exposure to other viral communicable diseases: Secondary | ICD-10-CM | POA: Diagnosis not present

## 2015-05-21 DIAGNOSIS — I10 Essential (primary) hypertension: Secondary | ICD-10-CM | POA: Diagnosis not present

## 2015-05-21 DIAGNOSIS — J441 Chronic obstructive pulmonary disease with (acute) exacerbation: Secondary | ICD-10-CM | POA: Diagnosis not present

## 2015-05-21 NOTE — ED Notes (Signed)
Pt is asleep on her right side. Pt remains on her nasal canula 2 liters. Pt remains with a sitter for safety. Pt is to be transferred to [facility] this am. She does contract for safety.

## 2015-08-20 ENCOUNTER — Telehealth: Payer: Self-pay | Admitting: Psychology

## 2015-08-20 DIAGNOSIS — F319 Bipolar disorder, unspecified: Secondary | ICD-10-CM

## 2015-08-20 DIAGNOSIS — F429 Obsessive-compulsive disorder, unspecified: Secondary | ICD-10-CM

## 2015-08-20 NOTE — Telephone Encounter (Signed)
Ms. Madura left a VM yesterday requesting an appointment for depression.  I called the patient back and left a VM.    Reviewed medical record including a note from me on 08/27/2013.  Based on that note and the history since then, I am wondering whether an in-home [place] might be useful / available.  I question Ms. Helseth's ability to follow through with traditional therapy.  She might do well enough with the briefer in duration Integrated Care sessions however I think someone attending to her in her home environment might be better.  Will ask [facility] Social Worker Casimer Lanius to see if, based on her insurance status, she would qualify for services.

## 2015-08-21 NOTE — Telephone Encounter (Signed)
[name] called back and we discussed her recent hospitalization in [place] (some time in March - perhaps on the heels of her February ED visit??).  She reports she had ECT there.  She also says they discharged her home on Remeron with follow-up recommended at [facility].  She stopped taking the Remeron saying it makes her "loopy."  She states she will not go back to [place].  Her main goal in getting seen here is to get psychiatric medicine.  Last seen at [facility] 05/13/15.  She is not a candidate for primary care psychiatry given her history.  She is not a good candidate for [facility] given her severity and chronicity.  Discussed her coming in to meet with a [facility] to get the recent history and explore options.  She is most interested in medication.  Gave her the number for [facility] to call for outpatient psychiatry.  Not sure if they have Medicare openings.  Asked her to call me back if she can't be seen there or can't be seen for a long time (e.g. More than a month).    Strongly encouraged her to schedule an appointment with her PCP to check in on other health issues.  She says she weighs 90 pounds.  I expressed concern about her overall health status.  I have talked to [facility] SW about possible programs and have a message in to Dr. Sherril Cong to discuss this patient so we can determine what resources might be a good match.

## 2015-09-03 NOTE — Telephone Encounter (Signed)
Called patient to follow-up.  My plan is to get her into see her PCP to check on overall health status and to see if she might be a good candidate for a program like [facility].  Additionally, I was going to check in with her about [place] to see if she was able to get in there for psychiatry.  Left a VM.

## 2015-09-11 DIAGNOSIS — H04123 Dry eye syndrome of bilateral lacrimal glands: Secondary | ICD-10-CM | POA: Diagnosis not present

## 2015-09-11 DIAGNOSIS — H524 Presbyopia: Secondary | ICD-10-CM | POA: Diagnosis not present

## 2015-09-11 DIAGNOSIS — H25812 Combined forms of age-related cataract, left eye: Secondary | ICD-10-CM | POA: Diagnosis not present

## 2015-09-11 DIAGNOSIS — H5203 Hypermetropia, bilateral: Secondary | ICD-10-CM | POA: Diagnosis not present

## 2015-09-11 DIAGNOSIS — H52223 Regular astigmatism, bilateral: Secondary | ICD-10-CM | POA: Diagnosis not present

## 2015-09-11 DIAGNOSIS — Z961 Presence of intraocular lens: Secondary | ICD-10-CM | POA: Diagnosis not present

## 2015-09-21 ENCOUNTER — Encounter (HOSPITAL_COMMUNITY): Payer: Self-pay | Admitting: Emergency Medicine

## 2015-09-21 ENCOUNTER — Emergency Department (HOSPITAL_COMMUNITY)
Admission: EM | Admit: 2015-09-21 | Discharge: 2015-09-21 | Disposition: A | Discharge status: Home / Self Care | Payer: Medicare Other | Attending: Emergency Medicine | Admitting: Emergency Medicine

## 2015-09-21 DIAGNOSIS — R531 Weakness: Secondary | ICD-10-CM | POA: Diagnosis not present

## 2015-09-21 DIAGNOSIS — I10 Essential (primary) hypertension: Secondary | ICD-10-CM | POA: Insufficient documentation

## 2015-09-21 DIAGNOSIS — Z79899 Other long term (current) drug therapy: Secondary | ICD-10-CM | POA: Diagnosis not present

## 2015-09-21 DIAGNOSIS — F17210 Nicotine dependence, cigarettes, uncomplicated: Secondary | ICD-10-CM | POA: Diagnosis not present

## 2015-09-21 DIAGNOSIS — J449 Chronic obstructive pulmonary disease, unspecified: Secondary | ICD-10-CM | POA: Insufficient documentation

## 2015-09-21 DIAGNOSIS — R69 Illness, unspecified: Secondary | ICD-10-CM | POA: Diagnosis not present

## 2015-09-21 DIAGNOSIS — F329 Major depressive disorder, single episode, unspecified: Secondary | ICD-10-CM | POA: Diagnosis not present

## 2015-09-21 DIAGNOSIS — Z7982 Long term (current) use of aspirin: Secondary | ICD-10-CM | POA: Insufficient documentation

## 2015-09-21 LAB — COMPREHENSIVE METABOLIC PANEL
ALT: 10 U/L — ABNORMAL LOW (ref 14–54)
AST: 16 U/L (ref 15–41)
Albumin: 3.6 g/dL (ref 3.5–5.0)
Alkaline Phosphatase: 66 U/L (ref 38–126)
Anion gap: 8 (ref 5–15)
BUN: 16 mg/dL (ref 6–20)
CO2: 26 mmol/L (ref 22–32)
Calcium: 9.2 mg/dL (ref 8.9–10.3)
Chloride: 105 mmol/L (ref 101–111)
Creatinine, Ser: 0.81 mg/dL (ref 0.44–1.00)
GFR calc Af Amer: >60 mL/min (ref >60)
GFR calc non Af Amer: >60 mL/min (ref >60)
Glucose, Bld: 87 mg/dL (ref 65–99)
Potassium: 3.2 mmol/L — ABNORMAL LOW (ref 3.5–5.1)
Sodium: 139 mmol/L (ref 135–145)
Total Bilirubin: 0.2 mg/dL — ABNORMAL LOW (ref 0.3–1.2)
Total Protein: 5.9 g/dL — ABNORMAL LOW (ref 6.5–8.1)

## 2015-09-21 LAB — CBC WITH DIFFERENTIAL/PLATELET
Basophils Absolute: 0 10*3/uL (ref 0.0–0.1)
Basophils Relative: 1 %
Eosinophils Absolute: 0.1 10*3/uL (ref 0.0–0.7)
Eosinophils Relative: 3 %
HCT: 40 % (ref 36.0–46.0)
Hemoglobin: 12.7 g/dL (ref 12.0–15.0)
Lymphocytes Relative: 26 %
Lymphs Abs: 1.1 10*3/uL (ref 0.7–4.0)
MCH: 31.8 pg (ref 26.0–34.0)
MCHC: 31.8 g/dL (ref 30.0–36.0)
MCV: 100 fL (ref 78.0–100.0)
Monocytes Absolute: 0.3 10*3/uL (ref 0.1–1.0)
Monocytes Relative: 8 %
Neutro Abs: 2.6 10*3/uL (ref 1.7–7.7)
Neutrophils Relative %: 62 %
Platelets: 255 10*3/uL (ref 150–400)
RBC: 4 MIL/uL (ref 3.87–5.11)
RDW: 14.4 % (ref 11.5–15.5)
WBC: 4.1 10*3/uL (ref 4.0–10.5)

## 2015-09-21 LAB — URINE MICROSCOPIC-ADD ON

## 2015-09-21 LAB — TROPONIN I: Troponin I: <0.03 ng/mL (ref <0.031)

## 2015-09-21 LAB — URINALYSIS, ROUTINE W REFLEX MICROSCOPIC
Bilirubin Urine: NEGATIVE
Glucose, UA: NEGATIVE mg/dL
Hgb urine dipstick: NEGATIVE
Ketones, ur: 15 mg/dL — AB
Nitrite: NEGATIVE
Protein, ur: NEGATIVE mg/dL
Specific Gravity, Urine: 1.016 (ref 1.005–1.030)
pH: 7 (ref 5.0–8.0)

## 2015-09-21 LAB — T4, FREE: Free T4: 0.27 ng/dL — ABNORMAL LOW (ref 0.61–1.12)

## 2015-09-21 LAB — LIPASE, BLOOD: Lipase: 32 U/L (ref 11–51)

## 2015-09-21 LAB — TSH: TSH: >90 u[IU]/mL — ABNORMAL HIGH (ref 0.350–4.500)

## 2015-09-21 LAB — BRAIN NATRIURETIC PEPTIDE: B Natriuretic Peptide: 30.7 pg/mL (ref 0.0–100.0)

## 2015-09-21 MED ORDER — LEVOTHYROXINE SODIUM 75 MCG PO TABS: 75.0000 ug | ORAL_TABLET | Freq: Once | ORAL | Status: DC

## 2015-09-21 MED ORDER — SODIUM CHLORIDE 0.9 % IV BOLUS (SEPSIS)
1000.0000 mL | Freq: Once | INTRAVENOUS | Status: AC
  Administered 2015-09-21: 1000 mL via INTRAVENOUS

## 2015-09-21 MED ORDER — LEVOTHYROXINE SODIUM 75 MCG PO TABS
75.0000 ug | ORAL_TABLET | Freq: Once | ORAL | Status: AC
  Administered 2015-09-21: 75 ug via ORAL
  Filled 2015-09-21 (×2): qty 1

## 2015-09-21 MED ORDER — MIRTAZAPINE 15 MG PO TABS: 15.0000 mg | ORAL_TABLET | Freq: Every day | ORAL | Status: DC

## 2015-09-21 NOTE — Care Management Note (Signed)
Case Management Note  Patient Details  Name: [name] MRN: [id number] Date of Birth: 1946/02/28  Subjective/Objective:   70 y.o. F seen in the ED on 09/21/2015 for  generalized weakness, anhedonia x 3 months. C/O  anorexia, generalized discomfort, She told the MD that she stopped taking her medicine when she ran out of money. PMH of multiple medical issues including depression, anxiety, and thyroid disease. Reports Medicare part A and B, Which prevents MATCH assistance.  Reports daughter assisting her with obtaining Part D as she was covered by [employer] until just recently when she lost her medication coverage. CM stressed the importance of Part D coverage for her medications as she MUST have her Thyroid medication daily. Good Rx indicates this can be purchased for $4.00  At [place]. Pt reports she will fill this Rx today after she is discharged and begin to take this medication on a regular basis.                   Action/Plan:Updated both bedside RN and EDP. To be discharged with plans to go to [employer] and purchase Synthroid for $4.00. Will follow up with PCP at Internal Med clinic here at [facility]. No further CM needs at this time.    Expected Discharge Date:                  Expected Discharge Plan:  Home/Self Care  In-House Referral:     Discharge planning Services  CM Consult, Medication Assistance  Post Acute Care Choice:    Choice offered to:  Patient  DME Arranged:    DME Agency:     HH Arranged:    [place] Agency:     Status of Service:  Completed, signed off  Medicare Important Message Given:    Date Medicare IM Given:    Medicare IM give by:    Date Additional Medicare IM Given:    Additional Medicare Important Message give by:     If discussed at [place] of Stay Meetings, dates discussed:    Additional Comments:  Delrae Sawyers, RN 09/21/2015, 3:52 PM

## 2015-09-21 NOTE — ED Provider Notes (Signed)
CSN: [id number]     Arrival date & time 09/21/15  1341 History   First MD Initiated Contact with Patient 09/21/15 1346     Chief Complaint  Patient presents with  . Fatigue     (Consider location/radiation/quality/duration/timing/severity/associated sxs/prior Treatment) HPI Patient presents with generalized weakness, and anhedonia About 3 months ago the patient began to be symptomatic, since that time she has had persistent symptoms, with associated anorexia, generalized discomfort, but no focal pain, no focal weakness, no confusion, disorientation, vomiting, fever, chills. She states that she Stopped taking her medicine when she ran out of money, acknowledges history of multiple medical issues including depression, anxiety, thyroid disease. Since onset no alleviating factors She also acknowledge his recent improvement in her condition prior to this episode with electro convulsion therapy.   Past Medical History  Diagnosis Date  . OCD (obsessive compulsive disorder)   . Depression   . Anxiety   . PUD (peptic ulcer disease) 09/2006    H pylori  . Diverticulosis of colon   . GERD (gastroesophageal reflux disease)   . COPD (chronic obstructive pulmonary disease) (HCC)     Due to long history of tobacco abuse, still smoking  . History of uterine prolapse 10/2004    Dr Cletis Media  . Thyroid disease     Ho R thyroid noduel plus mild hyperthyroidism. Treated with radioiodine therapty on 04/2006. Dr Estrella Myrtle.  [name] Heart murmur   . Osteoporosis   . Hypertension    Past Surgical History  Procedure Laterality Date  . Colonoscopy  06/03/2005    Hyperplastic polyps, sigmoid diverticulosis, internal hemorroids  . Ganglion removal  12/2005    R wrist subretinacular dorsal ganglion  . Biopsy thyroid  08/2008    Atypia and Hurtle cells, partial thyroidectomy   . Tonsillectomy and adenoidectomy  10/2004  . Pft  11/2006    Obstructive pattern.  Poor response to bronchodilators.   . Thyroidectomy, partial   08/2008    Dr Ronnald Collum  . Transthoracic echocardiogram  123456    Normal systolic function.  EF 55-60%.  Mild MR, atria ok, trivial pericardial effusion  . Carotid ultrasound  09/10/09    No significant extracranial carotid artery stenosis, vertebrals are patent with antegrade flow.   . Mri head  09/20/09    No acute intracranial abn.  Signal abnormality suggestive of chronic small vessel ischemia, maximal in the right subinsular white and deep gray mater.   . Tvt  06/15/10    Tension-free Vaginal Tape, Dr Mancel Bale, for urge incontinence  . Abdominal hysterectomy    . Bladder suspension      mesh sling  . Foreign body removal Right 02/21/2015    Procedure: RIGHT FOOT FOREIGN BODY REMOVAL ;  Surgeon: Marchia Bond, MD;  Location: [facility];  Service: Orthopedics;  Laterality: Right;   Family History  Problem Relation Age of Onset  . Heart disease Mother   . Heart disease Maternal Grandmother   . Heart disease Maternal Grandfather   . Cirrhosis Sister   . Hypertension Sister   . Other Neg Hx    Social History  Substance Use Topics  . Smoking status: Current Every Day Smoker -- 0.25 packs/day for 35 years    Types: Cigarettes  . Smokeless tobacco: Never Used     Comment: smokes less than 1 pack per week. ready to quitt.  . Alcohol Use: No   OB History    Gravida Para Term Preterm AB TAB SAB  Ectopic Multiple Living   2         2     Review of Systems  Constitutional:       Per HPI, otherwise negative  HENT:       Per HPI, otherwise negative  Respiratory:       Per HPI, otherwise negative  Cardiovascular:       Per HPI, otherwise negative  Gastrointestinal: Negative for vomiting.  Endocrine:       Negative aside from HPI  Genitourinary:       Neg aside from HPI   Musculoskeletal:       Per HPI, otherwise negative  Skin: Negative.   Neurological: Positive for weakness. Negative for syncope.  Psychiatric/Behavioral: Positive for dysphoric mood and  decreased concentration. Negative for suicidal ideas. The patient is nervous/anxious.       Allergies  Seroquel; Amoxicillin; Ciprofloxacin; Haloperidol lactate; Ketorolac tromethamine; Latuda; Thorazine; and Wellbutrin  Home Medications   Prior to Admission medications   Medication Sig Start Date End Date Taking? Authorizing Provider  aspirin 325 MG tablet Take 325 mg by mouth daily as needed for mild pain, moderate pain or headache.     Historical Provider, MD  atorvastatin (LIPITOR) 40 MG tablet Take 1 tablet (40 mg total) by mouth daily. 06/15/14   Frazier Richards, MD  fluticasone-salmeterol (ADVAIR HFA) [phone] MCG/ACT inhaler Inhale 2 puffs into the lungs 2 (two) times daily. 06/26/14   Timmothy Euler, MD  levothyroxine (SYNTHROID, LEVOTHROID) 75 MCG tablet TAKE 1 TABLET BY MOUTH DAILY FOR THYROID 09/04/14   Frazier Richards, MD  lisinopril (PRINIVIL,ZESTRIL) 5 MG tablet TAKE 1 TABLET (5 MG TOTAL) BY MOUTH DAILY. 09/04/14   Frazier Richards, MD  loratadine (CLARITIN) 10 MG tablet Take 1 tablet (10 mg total) by mouth daily. Patient not taking: Reported on 05/17/2015 12/04/14   Rosemarie Ax, MD  meloxicam (MOBIC) 15 MG tablet Take 1 tablet (15 mg total) by mouth daily. Patient not taking: Reported on 03/31/2015 08/16/14   Frazier Richards, MD  methocarbamol (ROBAXIN) 500 MG tablet Take 1 tablet (500 mg total) by mouth 2 (two) times daily as needed for muscle spasms. Patient not taking: Reported on 03/31/2015 11/16/14   Waynetta Pean, PA-C  mirtazapine (REMERON) 15 MG tablet Take 1 tablet (15 mg total) by mouth at bedtime. 04/21/15   [name], NP  traMADol (ULTRAM) 50 MG tablet Take 1 tablet (50 mg total) by mouth every 6 (six) hours as needed. Patient not taking: Reported on 03/31/2015 02/21/15   Marchia Bond, MD  vitamin E 400 UNIT capsule Take 2 capsules (800 Units total) by mouth daily. 09/26/13   [name], NP   LMP 01/09/2012 Physical Exam  Constitutional: She is oriented to  person, place, and time. She appears ill. No distress.  HENT:  Head: Normocephalic and atraumatic.  Eyes: Conjunctivae and EOM are normal.  Cardiovascular: Normal rate and regular rhythm.   Pulmonary/Chest: Effort normal and breath sounds normal. No stridor. No respiratory distress.  Abdominal: She exhibits no distension.  Musculoskeletal: She exhibits no edema.  Neurological: She is oriented to person, place, and time. No cranial nerve deficit.  Skin: Skin is warm and dry.  Psychiatric: Thought content is not paranoid and not delusional. She exhibits a depressed mood. She expresses no homicidal and no suicidal ideation. She expresses no suicidal plans and no homicidal plans.  Nursing note and vitals reviewed.   ED Course  Procedures (  including critical care time) Labs Review Labs Reviewed  COMPREHENSIVE METABOLIC PANEL - Abnormal; Notable for the following:    Potassium 3.2 (*)    Total Protein 5.9 (*)    ALT 10 (*)    Total Bilirubin 0.2 (*)    All other components within normal limits  URINALYSIS, ROUTINE W REFLEX MICROSCOPIC (NOT AT [facility]) - Abnormal; Notable for the following:    Ketones, ur 15 (*)    Leukocytes, UA TRACE (*)    All other components within normal limits  TSH - Abnormal; Notable for the following:    TSH >90.000 (*)    All other components within normal limits  T4, FREE - Abnormal; Notable for the following:    Free T4 0.27 (*)    All other components within normal limits  URINE MICROSCOPIC-ADD ON - Abnormal; Notable for the following:    Squamous Epithelial / LPF 0-5 (*)    Bacteria, UA RARE (*)    Casts HYALINE CASTS (*)    Crystals CA OXALATE CRYSTALS (*)    All other components within normal limits  LIPASE, BLOOD  BRAIN NATRIURETIC PEPTIDE  TROPONIN I  CBC WITH DIFFERENTIAL/PLATELET      I discussed the patient's case with our case management team to facilitate obtaining pharmaceuticals.   On repeat exam the patient is calm, in no distress.   Discussed all findings, patient will obtain her thyroid, Remeron, generic. MDM  Patient presents with generalized weakness, discomfort, but is awake, alert, hemodynamically stable. Patient's acknowledge history of thyroid disease suggests possible etiology. No evidence for acute psychosis, infectious etiology. Patient is hemodynamically stable, with unremarkable electrolytes. Patient restarted on her thyroid medication, Remeron, after discussion with case management and patient discharged with resources as well.  Carmin Muskrat, MD 09/21/15 [phone]

## 2015-09-21 NOTE — Discharge Instructions (Signed)
As discussed, it is important that you continue medication, including particular your thyroid medication and your Remeron.   Please use the provided resources for assistance as needed.  Please be sure to follow-up with your primary care physician.  Return here for concerning changes in your condition.

## 2015-09-21 NOTE — ED Notes (Signed)
Per [facility] patient complains of weakness and depression.  Patient states she has felt weak and depressed for months and today "couldn't stand it anymore".  States she has no appetite, last oral intake was a [employer] cheeseburger yesterday.  Patient alert, oriented, and ambulates with steady gait.

## 2015-10-08 ENCOUNTER — Emergency Department (HOSPITAL_COMMUNITY): Payer: Medicare Other

## 2015-10-08 ENCOUNTER — Encounter (HOSPITAL_COMMUNITY): Payer: Self-pay | Admitting: Emergency Medicine

## 2015-10-08 ENCOUNTER — Emergency Department (HOSPITAL_COMMUNITY)
Admission: EM | Admit: 2015-10-08 | Discharge: 2015-10-08 | Disposition: A | Discharge status: Home / Self Care | Payer: Medicare Other | Attending: Emergency Medicine | Admitting: Emergency Medicine

## 2015-10-08 DIAGNOSIS — F17210 Nicotine dependence, cigarettes, uncomplicated: Secondary | ICD-10-CM | POA: Insufficient documentation

## 2015-10-08 DIAGNOSIS — R531 Weakness: Secondary | ICD-10-CM | POA: Diagnosis not present

## 2015-10-08 DIAGNOSIS — Z79899 Other long term (current) drug therapy: Secondary | ICD-10-CM | POA: Diagnosis not present

## 2015-10-08 DIAGNOSIS — I10 Essential (primary) hypertension: Secondary | ICD-10-CM | POA: Insufficient documentation

## 2015-10-08 DIAGNOSIS — Z7982 Long term (current) use of aspirin: Secondary | ICD-10-CM | POA: Insufficient documentation

## 2015-10-08 DIAGNOSIS — R404 Transient alteration of awareness: Secondary | ICD-10-CM | POA: Diagnosis not present

## 2015-10-08 DIAGNOSIS — E039 Hypothyroidism, unspecified: Secondary | ICD-10-CM | POA: Diagnosis not present

## 2015-10-08 DIAGNOSIS — R630 Anorexia: Secondary | ICD-10-CM | POA: Diagnosis not present

## 2015-10-08 DIAGNOSIS — J449 Chronic obstructive pulmonary disease, unspecified: Secondary | ICD-10-CM | POA: Diagnosis not present

## 2015-10-08 DIAGNOSIS — N390 Urinary tract infection, site not specified: Secondary | ICD-10-CM

## 2015-10-08 LAB — URINE MICROSCOPIC-ADD ON: RBC / HPF: NONE SEEN RBC/hpf (ref 0–5)

## 2015-10-08 LAB — URINALYSIS, ROUTINE W REFLEX MICROSCOPIC
Bilirubin Urine: NEGATIVE
Glucose, UA: NEGATIVE mg/dL
Hgb urine dipstick: NEGATIVE
Ketones, ur: 15 mg/dL — AB
Nitrite: NEGATIVE
Protein, ur: NEGATIVE mg/dL
Specific Gravity, Urine: 1.018 (ref 1.005–1.030)
pH: 7 (ref 5.0–8.0)

## 2015-10-08 LAB — RAPID URINE DRUG SCREEN, HOSP PERFORMED
Amphetamines: NOT DETECTED
Barbiturates: NOT DETECTED
Benzodiazepines: NOT DETECTED
Cocaine: NOT DETECTED
Opiates: NOT DETECTED
Tetrahydrocannabinol: NOT DETECTED

## 2015-10-08 LAB — COMPREHENSIVE METABOLIC PANEL
ALT: 8 U/L — ABNORMAL LOW (ref 14–54)
AST: 14 U/L — ABNORMAL LOW (ref 15–41)
Albumin: 3.8 g/dL (ref 3.5–5.0)
Alkaline Phosphatase: 83 U/L (ref 38–126)
Anion gap: 10 (ref 5–15)
BUN: 19 mg/dL (ref 6–20)
CO2: 23 mmol/L (ref 22–32)
Calcium: 9.7 mg/dL (ref 8.9–10.3)
Chloride: 107 mmol/L (ref 101–111)
Creatinine, Ser: 0.87 mg/dL (ref 0.44–1.00)
GFR calc Af Amer: >60 mL/min (ref >60)
GFR calc non Af Amer: >60 mL/min (ref >60)
Glucose, Bld: 91 mg/dL (ref 65–99)
Potassium: 3.9 mmol/L (ref 3.5–5.1)
Sodium: 140 mmol/L (ref 135–145)
Total Bilirubin: 0.6 mg/dL (ref 0.3–1.2)
Total Protein: 6.4 g/dL — ABNORMAL LOW (ref 6.5–8.1)

## 2015-10-08 LAB — CBC
HCT: 42.7 % (ref 36.0–46.0)
Hemoglobin: 13.8 g/dL (ref 12.0–15.0)
MCH: 32.6 pg (ref 26.0–34.0)
MCHC: 32.3 g/dL (ref 30.0–36.0)
MCV: 100.9 fL — ABNORMAL HIGH (ref 78.0–100.0)
Platelets: 307 10*3/uL (ref 150–400)
RBC: 4.23 MIL/uL (ref 3.87–5.11)
RDW: 13.7 % (ref 11.5–15.5)
WBC: 5.4 10*3/uL (ref 4.0–10.5)

## 2015-10-08 LAB — LIPASE, BLOOD: Lipase: 31 U/L (ref 11–51)

## 2015-10-08 LAB — T4, FREE: Free T4: 1.25 ng/dL — ABNORMAL HIGH (ref 0.61–1.12)

## 2015-10-08 LAB — ETHANOL: Alcohol, Ethyl (B): <5 mg/dL (ref <5)

## 2015-10-08 LAB — TSH: TSH: 12.171 u[IU]/mL — ABNORMAL HIGH (ref 0.350–4.500)

## 2015-10-08 MED ORDER — CEPHALEXIN 500 MG PO CAPS: 500.0000 mg | ORAL_CAPSULE | Freq: Three times a day (TID) | ORAL | Status: DC

## 2015-10-08 MED ORDER — SODIUM CHLORIDE 0.9 % IV BOLUS (SEPSIS)
1000.0000 mL | Freq: Once | INTRAVENOUS | Status: AC
  Administered 2015-10-08: 1000 mL via INTRAVENOUS

## 2015-10-08 MED ORDER — MOMETASONE FURO-FORMOTEROL FUM 200-5 MCG/ACT IN AERO
2.0000 | INHALATION_SPRAY | Freq: Two times a day (BID) | RESPIRATORY_TRACT | Status: DC
  Filled 2015-10-08: qty 8.8

## 2015-10-08 MED ORDER — ONDANSETRON HCL 4 MG PO TABS: 4.0000 mg | ORAL_TABLET | Freq: Three times a day (TID) | ORAL | Status: DC | PRN

## 2015-10-08 MED ORDER — ASPIRIN 325 MG PO TABS: 325.0000 mg | ORAL_TABLET | Freq: Every day | ORAL | Status: DC | PRN

## 2015-10-08 MED ORDER — ACETAMINOPHEN 325 MG PO TABS: 650.0000 mg | ORAL_TABLET | ORAL | Status: DC | PRN

## 2015-10-08 MED ORDER — ATORVASTATIN CALCIUM 40 MG PO TABS: 40.0000 mg | ORAL_TABLET | Freq: Every day | ORAL | Status: DC

## 2015-10-08 MED ORDER — DEXTROSE 5 % IV SOLN
1.0000 g | Freq: Once | INTRAVENOUS | Status: AC
  Administered 2015-10-08: 1 g via INTRAVENOUS
  Filled 2015-10-08: qty 10

## 2015-10-08 MED ORDER — LEVOTHYROXINE SODIUM 75 MCG PO TABS
75.0000 ug | ORAL_TABLET | Freq: Once | ORAL | Status: DC
  Filled 2015-10-08: qty 1

## 2015-10-08 MED ORDER — LISINOPRIL 10 MG PO TABS: 5.0000 mg | ORAL_TABLET | Freq: Every day | ORAL | Status: DC

## 2015-10-08 MED ORDER — MIRTAZAPINE 15 MG PO TABS: 15.0000 mg | ORAL_TABLET | Freq: Every day | ORAL | Status: DC

## 2015-10-08 NOTE — BH Assessment (Signed)
Attempted telepsych.  Telepsych not yet available.  Nurse to call TTS when ready.

## 2015-10-08 NOTE — ED Provider Notes (Signed)
CSN: [id number]     Arrival date & time 10/08/15  1337 History   First MD Initiated Contact with Patient 10/08/15 1408     Chief Complaint  Patient presents with  . Weakness     (Consider location/radiation/quality/duration/timing/severity/associated sxs/prior Treatment) HPI Comments: Patient presents by EMS with generalized weakness, anorexia, poor appetite and depression. She denies any suicidal or homicidal thoughts. She was ED on June 18 with similar symptoms. She had stopped her Synthroid at that time which was restarted as her TSH was very elevated. She reports compliance with this. He states she's not noticed any improvement. She is generally weak and not eating and laying around and feeling she has no energy. Denies any fever. Denies any nausea or vomiting. Denies any urinary symptoms. No chest pain or shortness of breath. No abdominal pain. She reports being admitted to [facility] several months ago for electroconvulsive therapy. Only intake yesterday was [employer]c [employer] cheeseburger, interestingly, gave the same history on June 18.  Patient is a 70 y.o. female presenting with weakness. The history is provided by the patient and the EMS personnel.  Weakness Pertinent negatives include no chest pain, no abdominal pain, no headaches and no shortness of breath.    Past Medical History  Diagnosis Date  . OCD (obsessive compulsive disorder)   . Depression   . Anxiety   . PUD (peptic ulcer disease) 09/2006    H pylori  . Diverticulosis of colon   . GERD (gastroesophageal reflux disease)   . COPD (chronic obstructive pulmonary disease) (HCC)     Due to long history of tobacco abuse, still smoking  . History of uterine prolapse 10/2004    Dr Cletis Media  . Thyroid disease     Ho R thyroid noduel plus mild hyperthyroidism. Treated with radioiodine therapty on 04/2006. Dr Estrella Myrtle.  [name] Heart murmur   . Osteoporosis   . Hypertension    Past Surgical History  Procedure Laterality Date  .  Colonoscopy  06/03/2005    Hyperplastic polyps, sigmoid diverticulosis, internal hemorroids  . Ganglion removal  12/2005    R wrist subretinacular dorsal ganglion  . Biopsy thyroid  08/2008    Atypia and Hurtle cells, partial thyroidectomy   . Tonsillectomy and adenoidectomy  10/2004  . Pft  11/2006    Obstructive pattern.  Poor response to bronchodilators.   . Thyroidectomy, partial  08/2008    Dr Ronnald Collum  . Transthoracic echocardiogram  123456    Normal systolic function.  EF 55-60%.  Mild MR, atria ok, trivial pericardial effusion  . Carotid ultrasound  09/10/09    No significant extracranial carotid artery stenosis, vertebrals are patent with antegrade flow.   . Mri head  09/20/09    No acute intracranial abn.  Signal abnormality suggestive of chronic small vessel ischemia, maximal in the right subinsular white and deep gray mater.   . Tvt  06/15/10    Tension-free Vaginal Tape, Dr Mancel Bale, for urge incontinence  . Abdominal hysterectomy    . Bladder suspension      mesh sling  . Foreign body removal Right 02/21/2015    Procedure: RIGHT FOOT FOREIGN BODY REMOVAL ;  Surgeon: Marchia Bond, MD;  Location: [place];  Service: Orthopedics;  Laterality: Right;   Family History  Problem Relation Age of Onset  . Heart disease Mother   . Heart disease Maternal Grandmother   . Heart disease Maternal Grandfather   . Cirrhosis Sister   . Hypertension Sister   .  Other Neg Hx    Social History  Substance Use Topics  . Smoking status: Current Every Day Smoker -- 0.25 packs/day for 35 years    Types: Cigarettes  . Smokeless tobacco: Never Used     Comment: smokes less than 1 pack per week. ready to quitt.  . Alcohol Use: No   OB History    Gravida Para Term Preterm AB TAB SAB Ectopic Multiple Living   2         2     Review of Systems  Constitutional: Negative for fever, activity change and appetite change.  HENT: Negative for congestion.   Eyes: Negative for visual  disturbance.  Respiratory: Negative for cough, chest tightness and shortness of breath.   Cardiovascular: Negative for chest pain and leg swelling.  Gastrointestinal: Negative for nausea, vomiting and abdominal pain.  Genitourinary: Negative for dysuria, hematuria, vaginal bleeding and vaginal discharge.  Musculoskeletal: Positive for myalgias and arthralgias.  Skin: Negative for rash.  Neurological: Positive for weakness. Negative for dizziness, light-headedness and headaches.  Psychiatric/Behavioral: Positive for dysphoric mood and decreased concentration. Negative for suicidal ideas. The patient is nervous/anxious.   A complete 10 system review of systems was obtained and all systems are negative except as noted in the HPI and PMH.      Allergies  Seroquel; Amoxicillin; Ciprofloxacin; Haloperidol lactate; Ketorolac tromethamine; Latuda; Thorazine; and Wellbutrin  Home Medications   Prior to Admission medications   Medication Sig Start Date End Date Taking? Authorizing Provider  aspirin 325 MG tablet Take 325 mg by mouth daily as needed for mild pain, moderate pain or headache.    Yes Historical Provider, MD  atorvastatin (LIPITOR) 40 MG tablet Take 1 tablet (40 mg total) by mouth daily. 06/15/14  Yes Frazier Richards, MD  fluticasone-salmeterol (ADVAIR HFA) [phone] MCG/ACT inhaler Inhale 2 puffs into the lungs 2 (two) times daily. 06/26/14  Yes Timmothy Euler, MD  levothyroxine (SYNTHROID, LEVOTHROID) 75 MCG tablet Take 1 tablet (75 mcg total) by mouth once. 09/21/15  Yes Carmin Muskrat, MD  lisinopril (PRINIVIL,ZESTRIL) 5 MG tablet TAKE 1 TABLET (5 MG TOTAL) BY MOUTH DAILY. 09/04/14  Yes Frazier Richards, MD  mirtazapine (REMERON) 15 MG tablet Take 1 tablet (15 mg total) by mouth at bedtime. 09/21/15  Yes Carmin Muskrat, MD  vitamin E 400 UNIT capsule Take 2 capsules (800 Units total) by mouth daily. 09/26/13  Yes [name], NP  loratadine (CLARITIN) 10 MG tablet Take 1 tablet (10 mg  total) by mouth daily. Patient not taking: Reported on 05/17/2015 12/04/14   Rosemarie Ax, MD  meloxicam (MOBIC) 15 MG tablet Take 1 tablet (15 mg total) by mouth daily. Patient not taking: Reported on 03/31/2015 08/16/14   Frazier Richards, MD  methocarbamol (ROBAXIN) 500 MG tablet Take 1 tablet (500 mg total) by mouth 2 (two) times daily as needed for muscle spasms. Patient not taking: Reported on 03/31/2015 11/16/14   Waynetta Pean, PA-C  traMADol (ULTRAM) 50 MG tablet Take 1 tablet (50 mg total) by mouth every 6 (six) hours as needed. Patient not taking: Reported on 03/31/2015 02/21/15   Marchia Bond, MD   BP 155/95 mmHg  Pulse 80  Temp(Src) 98.1 F (36.7 C) (Oral)  Resp 19  SpO2 98%  LMP 01/09/2012 Physical Exam  Constitutional: She is oriented to person, place, and time. She appears well-developed and well-nourished. No distress.  HENT:  Head: Normocephalic and atraumatic.  Mouth/Throat: Oropharynx is clear and  moist. No oropharyngeal exudate.  Eyes: Conjunctivae and EOM are normal. Pupils are equal, round, and reactive to light.  Neck: Normal range of motion. Neck supple.  No meningismus.  Cardiovascular: Normal rate, regular rhythm, normal heart sounds and intact distal pulses.   No murmur heard. Pulmonary/Chest: Effort normal and breath sounds normal. No respiratory distress. She exhibits no tenderness.  Abdominal: Soft. There is no tenderness. There is no rebound and no guarding.  Musculoskeletal: Normal range of motion. She exhibits no edema or tenderness.  Neurological: She is alert and oriented to person, place, and time. No cranial nerve deficit. She exhibits normal muscle tone. Coordination normal.  No ataxia on finger to nose bilaterally. No pronator drift. 5/5 strength throughout. CN 2-12 intact.Equal grip strength. Sensation intact.   Skin: Skin is warm.  Psychiatric: She has a normal mood and affect. Her behavior is normal.  Flat affect  Nursing note and vitals  reviewed.   ED Course  Procedures (including critical care time) Labs Review Labs Reviewed  COMPREHENSIVE METABOLIC PANEL - Abnormal; Notable for the following:    Total Protein 6.4 (*)    AST 14 (*)    ALT 8 (*)    All other components within normal limits  CBC - Abnormal; Notable for the following:    MCV 100.9 (*)    All other components within normal limits  URINALYSIS, ROUTINE W REFLEX MICROSCOPIC (NOT AT [facility]) - Abnormal; Notable for the following:    APPearance HAZY (*)    Ketones, ur 15 (*)    Leukocytes, UA LARGE (*)    All other components within normal limits  TSH - Abnormal; Notable for the following:    TSH 12.171 (*)    All other components within normal limits  T4, FREE - Abnormal; Notable for the following:    Free T4 1.25 (*)    All other components within normal limits  URINE MICROSCOPIC-ADD ON - Abnormal; Notable for the following:    Squamous Epithelial / LPF 0-5 (*)    Bacteria, UA FEW (*)    All other components within normal limits  URINE CULTURE  LIPASE, BLOOD  URINE RAPID DRUG SCREEN, HOSP PERFORMED  ETHANOL    Imaging Review Dg Chest 2 View  10/08/2015  CLINICAL DATA:  Weakness and no appetite.  Hypertension and COPD. EXAM: CHEST  2 VIEW COMPARISON:  05/17/2015 FINDINGS: Hyperinflation with increased retrosternal aeration and some flattening of the hemidiaphragms on the lateral view. Findings are compatible with emphysema. Skin ECG sticker on the right upper chest. Both lungs are clear without airspace disease or pulmonary edema. Atherosclerotic calcifications in the thoracic aorta. Surgical clips in the left lower neck. Heart size is normal. Degenerative changes in thoracic spine. No large pleural effusions. IMPRESSION: Hyperinflation and emphysematous changes.  No acute chest findings. Aortic atherosclerosis. Electronically Signed   By: Markus Daft M.D.   On: 10/08/2015 14:56   I have personally reviewed and evaluated these images and lab results as  part of my medical decision-making.   EKG Interpretation   Date/Time:  Wednesday October 08 2015 14:31:25 EDT Ventricular Rate:  81 PR Interval:    QRS Duration: 89 QT Interval:  369 QTC Calculation: 429 R Axis:   88 Text Interpretation:  Sinus rhythm Borderline right axis deviation No  significant change was found Confirmed by Wyvonnia Dusky  MD, Jubilee Vivero [phone]) on  10/08/2015 2:36:14 PM Also confirmed by Wyvonnia Dusky  MD, Lorenz Park [phone]), editor  WALKER, CCT, [place] ([id number])  on 10/08/2015 3:20:25 PM      MDM   Final diagnoses:  Anorexia  Hypothyroidism, unspecified hypothyroidism type   Patient with several months of depression, generalized weakness and anorexia. Vitals are stable. Abdomen is benign. History of hypothyroidism with noncompliance with Synthroid recently started on June 18.  Patient denies any suicidal or homicidal thoughts. Screening labs are sent. She'll be given IV fluids.  TSH has improved from greater than 90 to 12.1. FT4 1.25  D/w [facility] residents who will follow up her thyroid meds and adjust her dose. TTS to consult for depression.  Denies SI/HI Will treat UTI, culture sent. Dr. Laneta Simmers to assume care.  Ezequiel Essex, MD 10/08/15 [phone]

## 2015-10-08 NOTE — ED Notes (Signed)
Patient transported to X-ray 

## 2015-10-08 NOTE — ED Notes (Signed)
Sent urine culture to the lab for add on

## 2015-10-08 NOTE — ED Notes (Signed)
Pt brought in via EMS for increased depression sx and generalized weakness and not eating x several weeks; pt denies SI

## 2015-10-08 NOTE — ED Provider Notes (Signed)
Patient was assessed by TTS team and deemed appropriate for discharge. Pt ambulating at baseline, no acute distress on exam, and otherwise medically clear. Plan to follow up as needed and return precautions discussed for worsening or new concerning symptoms.    Patient will be treated with 7 days of Keflex for UTI.  Leo Grosser, MD 10/08/15 [phone]

## 2015-10-08 NOTE — Discharge Instructions (Signed)
Substance Abuse Treatment Programs ° °Intensive Outpatient Programs °[facility]     °[address]      °[place], [place]                   °[phone]      ° °[facility] °[address] °[place], [place] °[phone] ° °[facility]     °(Inpatient and outpatient)     °[address]           °[phone]   ° °[facility] °[phone] (Suboxone and Methadone) ° °[address]      °[place], [place] [address]      °[phone]      ° °[address] °[place], [place] °852-3033 ° °[facility] (Outpatient/Inpatient, [facility])    °(insurance only) [phone]      °       °[facility] (Groups & Residential) °[place], [place] °[phone] ° °   °[facility]     °[address]     °[place], [place]      °[phone]      ° °[facility] (for caregivers and family) °[address] °[place], [place] °[phone] ° ° ° ° ° °Residential Treatment Programs °[facility]      °[address], [place], [place] [address]  °[phone]      ° °[facility] °[address] [place], [place] [address] °[phone] ° °[facility]        °[phone]      ° °[facility] °[phone] ° °[facility]             °[address]                                         °[place], [place]                                                °[phone] or [phone]                              ° °[facility] [place] °[address] °[place], [address] °1.877.941.8954 ° °[facility]    °[address]      °[place], [place]     °[phone]      ° °[facility] °[address] °, [place] °[phone] ° °[facility]   °[address]     °[place], [place] [address]     °[phone]      °Admissions: 8am-3pm M-F ° °[facility] °[address] °[place],  [place] °[phone] ° °[facility]: Residential Program (90 Days)   °[place], [place]      °[phone] or [phone]    ° °[facility] °[place], [place] °(Walk in Hours over the weekend or by referral) ° °[facility] °[address], [place], [place] [address] °([phone]) [phone] ° °Crisis Mobile: [facility]:  1-877-626-1772 (for crisis response 24 hours a day) °[facility] Hotline:      1-800-256-2452 °Outpatient Psychiatry and Counseling ° °[facility]: Mobile Crisis   Management 24 hours:  1-579-867-5796  [facility] of the [employer] sliding scale fee and walk in schedule: M-F 8am-12pm/1pm-3pm [address]  [place], [place] [address] [place], [place] [address] [phone]  [facility] ([facility])- new patient walk-in appointments available Monday - Friday 8am -3pm.          [address] [place], [place] [address] [phone] or crisis line- [facility]/ Intensive Outpatient Therapy Program [place], [place] [address] [place]      [phone] [place], [place] [address]                 [place]   [facility] [phone] [place], [place] [address]   [facility]          [address] Johnette Abraham  [place], [place] [address]       [phone]  [facility] [address] [place], [place] [address] [phone]  [facility]    [address] [place], [place] [address]     [place], [place] Joycelyn Man     [place] [address]     [phone]       [facility] [place] [address]  [facility]     [address], [place], [place], [place] [address] [place]     [address]     [place], [place] [address]     [phone]       [facility] [address] [place], [place] [address] [phone] Resources for Temporary Residential Assistance/Crisis [facility]) M-F 8am-3pm   [address] [place], [place] [address]   [phone] Services include: laundry, barbering, support groups, case management, phone  & computer access, showers, AA/NA mtgs, mental health/substance abuse nurse, job skills class, disability information, VA assistance, spiritual classes, etc.   [facility]   [address], [place]     [place]              [employer] (women and children)       [place]. [place], [place] [address] [phone] [email or web address] for application and process Application Required  [facility]   [address]    [place] [address]     [phone]                    [place],  [address] [phone](OILNZVJK application appt.) Application Required  [facility] (women only)    [address]     [place],  [address]     [phone]  Intake starts 6pm daily Need valid ID, SSC, & Police report [employer] [address] [place], [place] [id number] Application Required  [employer] (men only)     [place].      [place], [place]       [facility] (Pregnant women only) [address] [place], [place]  [facility]      [place] [address]      [place], [place] [address]     [phone]             [facility] [address] [place], [place] 90 day commitment/SA/Application process  [facility](men only)     [address]     [place], [place]       Check-in at [facility] [address] [place], [place] [address] [phone] Men/Women/Women and Children must be there by 7 pm  [place], [place]                Urinary Tract Infection Urinary tract infections (UTIs) can develop anywhere along your urinary tract. Your urinary tract is your body's drainage system for removing wastes and extra water. Your urinary tract includes two kidneys, two ureters, a bladder, and a urethra. Your kidneys are a pair of bean-shaped organs. Each kidney is about the size of your fist. They are located below your ribs, one on each side of your spine. CAUSES Infections are caused by microbes, which are microscopic organisms, including fungi, viruses, and bacteria. These organisms are so small that they can only be seen through a microscope. Bacteria are the microbes that most commonly cause UTIs. SYMPTOMS  Symptoms of UTIs may vary by age and gender of the patient and by the location of the infection. Symptoms in young women typically include a frequent and intense urge to urinate and a painful, burning feeling in the bladder or urethra during urination. Older women and men are more likely to be tired, shaky, and weak and have muscle aches and abdominal pain. A fever may mean the infection is in your kidneys. Other symptoms of a kidney infection include pain in your back or sides below the ribs, nausea, and vomiting. DIAGNOSIS To diagnose a UTI, your caregiver will ask you about your symptoms. Your caregiver will also ask you to provide a urine sample. The urine sample will be tested for  bacteria and white blood cells. White blood cells are made by your body to help fight infection. TREATMENT  Typically, UTIs can be treated with medication. Because most UTIs are caused by a bacterial infection, they usually can be treated with the use of antibiotics. The choice of antibiotic and length of treatment depend on your symptoms and the type of bacteria causing your infection. HOME CARE INSTRUCTIONS  If you were prescribed antibiotics, take them exactly as your caregiver instructs you. Finish the medication even if you feel better after you have only taken some of the medication.  Drink enough water and fluids to keep your urine clear or pale yellow.  Avoid caffeine, tea, and carbonated beverages. They tend to irritate your bladder.  Empty your bladder often. Avoid holding urine for long periods of time.  Empty your bladder before and after sexual intercourse.  After a bowel movement, women should cleanse from front to back. Use each tissue only once. SEEK MEDICAL CARE IF:   You have back pain.  You develop a fever.  Your symptoms do not begin to resolve within 3 days. SEEK IMMEDIATE MEDICAL CARE IF:   You have severe back pain or lower abdominal pain.  You develop chills.  You have nausea or vomiting.  You have continued burning or discomfort with urination. MAKE SURE YOU:   Understand these instructions.  Will watch your condition.  Will get help right away if you are not doing well or get worse.   This information is not intended to replace advice given to you by your health care provider. Make sure you discuss any questions you have with your health care provider.   Document Released: 12/30/2004 Document Revised: 12/11/2014 Document Reviewed: 04/30/2011 Elsevier Interactive Patient Education [employer].

## 2015-10-08 NOTE — BH Assessment (Signed)
Tele Assessment Note   [name] is a 70 y.o. female who presented to [facility] on a voluntary basis with complaint of poor appetite and despondency.  Pt [place] alone [place] and felt despondent and concerned about her general body weakness and poor appetite, so she called an ambulance.  Pt reported as follows:  Since her discharge from [facility] in March 2017 (where she was treated for depressive symptoms), Pt has experienced general fatigue, despondency, rumination about the death of loved ones (including two friends who passed away earlier this year as well as her mother and several dogs), poor appetite, and some tearfulness, all culminating in a passive desire to die ("Sometimes I just wish God would take me in my sleep").  Pt stated that she is not suicidal.  Likewise, she denies homicidal ideation, self-injury, or hallucination.  Pt said she came to the hospital out of concern for her appetite, her physical weakness, and because she wants to try out a new psychotropic medication (Pt previously on Latuda, but stated that she can no longer take it because she could not tolerate the side effects).  Per report, Pt was a client at [facility], but she "missed too many appointments" and now is on a walk-in basis.  Pt stated that she would like help with finding new medication and then wants to go home.  Pt has support where she lives -- a sister lives in her high-rise building and helps her buy food.  During assessment, Pt presented as alert and oriented.  She was resting in her hospital bed and had good eye contact.  Pt was dressed in scrubs and seemed appropriately groomed. Pt described herself as sad and fatigued.  Affect was congruent.  Pt denied suicidal ideation but endorsed those depressive symptoms described above.  Pt's thought processes were within normal limits and thought content was goal-oriented.  Pt's memory and concentration were fair.  Speech was normal in  rate, rhythm, and volume.  Pt denied abuse and said she felt safe to return home.  Pt's insight and judgment were deemed fair.  Impulse control was good.  Consulted with C. Withrow, DNP, who determined that Pt does not meet inpatient criteria.  Recommend discharge with appropriate outpatient referrals.  Diagnosis: Major Depressive Disorder, Moderate  Past Medical History:  Past Medical History  Diagnosis Date  . OCD (obsessive compulsive disorder)   . Depression   . Anxiety   . PUD (peptic ulcer disease) 09/2006    H pylori  . Diverticulosis of colon   . GERD (gastroesophageal reflux disease)   . COPD (chronic obstructive pulmonary disease) (HCC)     Due to long history of tobacco abuse, still smoking  . History of uterine prolapse 10/2004    Dr Cletis Media  . Thyroid disease     Ho R thyroid noduel plus mild hyperthyroidism. Treated with radioiodine therapty on 04/2006. Dr Estrella Myrtle.  [name] Heart murmur   . Osteoporosis   . Hypertension     Past Surgical History  Procedure Laterality Date  . Colonoscopy  06/03/2005    Hyperplastic polyps, sigmoid diverticulosis, internal hemorroids  . Ganglion removal  12/2005    R wrist subretinacular dorsal ganglion  . Biopsy thyroid  08/2008    Atypia and Hurtle cells, partial thyroidectomy   . Tonsillectomy and adenoidectomy  10/2004  . Pft  11/2006    Obstructive pattern.  Poor response to bronchodilators.   . Thyroidectomy, partial  08/2008  Dr Ronnald Collum  . Transthoracic echocardiogram  123456    Normal systolic function.  EF 55-60%.  Mild MR, atria ok, trivial pericardial effusion  . Carotid ultrasound  09/10/09    No significant extracranial carotid artery stenosis, vertebrals are patent with antegrade flow.   . Mri head  09/20/09    No acute intracranial abn.  Signal abnormality suggestive of chronic small vessel ischemia, maximal in the right subinsular white and deep gray mater.   . Tvt  06/15/10    Tension-free Vaginal Tape, Dr Mancel Bale, for  urge incontinence  . Abdominal hysterectomy    . Bladder suspension      mesh sling  . Foreign body removal Right 02/21/2015    Procedure: RIGHT FOOT FOREIGN BODY REMOVAL ;  Surgeon: Marchia Bond, MD;  Location: [facility];  Service: Orthopedics;  Laterality: Right;    Family History:  Family History  Problem Relation Age of Onset  . Heart disease Mother   . Heart disease Maternal Grandmother   . Heart disease Maternal Grandfather   . Cirrhosis Sister   . Hypertension Sister   . Other Neg Hx     Social History:  reports that she has been smoking Cigarettes.  She has a 8.75 pack-year smoking history. She has never used smokeless tobacco. She reports that she does not drink alcohol or use illicit drugs.  Additional Social History:  Alcohol / Drug Use Pain Medications: See PTA Prescriptions: See PTA Over the Counter: See pTA History of alcohol / drug use?: No history of alcohol / drug abuse  CIWA: CIWA-Ar BP: 163/93 mmHg Pulse Rate: 78 COWS:    PATIENT STRENGTHS: (choose at least two) Average or above average intelligence Capable of independent living Communication skills  Allergies:  Allergies  Allergen Reactions  . Seroquel [Quetiapine Fumarate] Other (See Comments)    Made patient drowsy   . Amoxicillin Nausea Only  . Ciprofloxacin Other (See Comments)    Pt doesn't remember reaction  . Haloperidol Lactate Other (See Comments)    Made arms stiff  . Ketorolac Tromethamine Other (See Comments)    Pt doesn't remember reaction  . Anette Guarneri [Lurasidone Hcl] Other (See Comments)    Patient says he made her feel weird   . Thorazine [Chlorpromazine] Other (See Comments)    Pt does not remember reaction.   . Wellbutrin [Bupropion] Other (See Comments)    Makes her feel "sick, strange".    Home Medications:  (Not in a hospital admission)  OB/GYN Status:  Patient's last menstrual period was 01/09/2012.  General Assessment Data Location of Assessment:  [facility] ED TTS Assessment: In system Is this a Tele or Face-to-Face Assessment?: Tele Assessment Is this an Initial Assessment or a Re-assessment for this encounter?: Initial Assessment Marital status: Divorced Is patient pregnant?: No Pregnancy Status: No Living Arrangements: Alone Can pt return to current living arrangement?: Yes Admission Status: Voluntary Is patient capable of signing voluntary admission?: Yes Referral Source: Self/Family/Friend Insurance type: [place] MCD  Medical Screening Exam ([place]) Medical Exam completed: Yes  Crisis Care Plan Living Arrangements: Alone Name of Psychiatrist: Beverly Sessions (Not a current client -- missed too many appointments) Name of Therapist: [facility]  Education Status Is patient currently in school?: No  Risk to self with the past 6 months Suicidal Ideation: No Has patient been a risk to self within the past 6 months prior to admission? : No Suicidal Intent: No Has patient had any suicidal intent within the past  6 months prior to admission? : No Is patient at risk for suicide?: No Suicidal Plan?: No Has patient had any suicidal plan within the past 6 months prior to admission? : Yes Access to Means: No What has been your use of drugs/alcohol within the last 12 months?: Denied Previous Attempts/Gestures: No Intentional Self Injurious Behavior: None Family Suicide History: No Recent stressful life event(s): Loss (Comment), Other (Comment) (Loss of two friends, reflecting on death of mother) Persecutory voices/beliefs?: No Depression: Yes Depression Symptoms: Despondent, Isolating, Fatigue Substance abuse history and/or treatment for substance abuse?: No Suicide prevention information given to non-admitted patients: Not applicable  Risk to Others within the past 6 months Homicidal Ideation: No Does patient have any lifetime risk of violence toward others beyond the six months prior to admission? : No Thoughts of Harm to Others:  No Current Homicidal Intent: No Current Homicidal Plan: No Access to Homicidal Means: No History of harm to others?: No Assessment of Violence: None Noted Does patient have access to weapons?: No Criminal Charges Pending?: No Does patient have a court date: No Is patient on probation?: No  Psychosis Hallucinations: None noted Delusions: None noted  Mental Status Report Appearance/Hygiene: In scrubs Eye Contact: Good Motor Activity: Unremarkable Speech: Unremarkable Level of Consciousness: Alert Mood: Sad Affect: Appropriate to circumstance Anxiety Level: None Thought Processes: Coherent, Relevant Judgement: Partial Orientation: Person, Place, Time, Situation Obsessive Compulsive Thoughts/Behaviors: None  Cognitive Functioning Concentration: Decreased Memory: Recent Intact, Remote Intact IQ: Average Insight: Fair Impulse Control: Fair Appetite: Poor Sleep: No Change Vegetative Symptoms: None  ADLScreening [facility] Assessment Services) Patient's cognitive ability adequate to safely complete daily activities?: Yes Patient able to express need for assistance with ADLs?: Yes Independently performs ADLs?: Yes (appropriate for developmental age)  Prior Inpatient Therapy Prior Inpatient Therapy: Yes Prior Therapy Dates: 2017 Prior Therapy Facilty/Provider(s): [facility] Reason for Treatment: Depression  Prior Outpatient Therapy Prior Outpatient Therapy: Yes Prior Therapy Dates: 2016-2017 Prior Therapy Facilty/Provider(s): [facility] Reason for Treatment: Depression Does patient have an ACCT team?: No Does patient have Intensive In-House Services?  : No Does patient have [facility] services? : Yes Does patient have P4CC services?: No  ADL Screening (condition at time of admission) Patient's cognitive ability adequate to safely complete daily activities?: Yes Is the patient deaf or have difficulty hearing?: No Does the patient have difficulty seeing,  even when wearing glasses/contacts?: No Does the patient have difficulty concentrating, remembering, or making decisions?: No Patient able to express need for assistance with ADLs?: Yes Does the patient have difficulty dressing or bathing?: No Independently performs ADLs?: Yes (appropriate for developmental age) Does the patient have difficulty walking or climbing stairs?: No Weakness of Legs: None Weakness of Arms/Hands: None  Home Assistive Devices/Equipment Home Assistive Devices/Equipment: None  Therapy Consults (therapy consults require a physician order) PT Evaluation Needed: No OT Evalulation Needed: No SLP Evaluation Needed: No Abuse/Neglect Assessment (Assessment to be complete while patient is alone) Physical Abuse: Denies Verbal Abuse: Denies Sexual Abuse: Denies Exploitation of patient/patient's resources: Denies Self-Neglect: Denies Values / Beliefs Cultural Requests During Hospitalization: None Spiritual Requests During Hospitalization: None Consults Spiritual Care Consult Needed: No Social Work Consult Needed: No Regulatory affairs officer (For Healthcare) Does patient have an advance directive?: No Would patient like information on creating an advanced directive?: No - patient declined information    Additional Information 1:1 In Past 12 Months?: No CIRT Risk: No Elopement Risk: No Does patient have medical clearance?: Yes  Disposition:  Disposition Initial Assessment Completed for this Encounter: Yes Disposition of Patient: Outpatient treatment Type of outpatient treatment: Adult (Per C. Withrow, DNP, Pt does not meet inpt criteria)  Laurena Slimmer Teliah [place] 10/08/2015 6:08 PM

## 2015-10-08 NOTE — ED Notes (Signed)
Gave pt sandwich per provider

## 2015-10-08 NOTE — ED Notes (Signed)
Patient being transported to x-ray.

## 2015-10-09 LAB — URINE CULTURE

## 2015-10-13 ENCOUNTER — Ambulatory Visit (INDEPENDENT_AMBULATORY_CARE_PROVIDER_SITE_OTHER): Payer: Medicare Other | Admitting: Family Medicine

## 2015-10-13 ENCOUNTER — Encounter: Payer: Self-pay | Admitting: Family Medicine

## 2015-10-13 VITALS — BP 179/92 | HR 86 | Temp 98.3°F | Ht <= 58 in | Wt 88.0 lb

## 2015-10-13 DIAGNOSIS — I10 Essential (primary) hypertension: Secondary | ICD-10-CM | POA: Diagnosis not present

## 2015-10-13 DIAGNOSIS — K5900 Constipation, unspecified: Secondary | ICD-10-CM

## 2015-10-13 DIAGNOSIS — F319 Bipolar disorder, unspecified: Secondary | ICD-10-CM

## 2015-10-13 MED ORDER — LEVOTHYROXINE SODIUM 75 MCG PO TABS: 75.0000 ug | ORAL_TABLET | Freq: Once | ORAL | Status: DC

## 2015-10-13 MED ORDER — POLYETHYLENE GLYCOL 3350 17 GM/SCOOP PO POWD: 17.0000 g | Freq: Two times a day (BID) | ORAL | Status: DC | PRN

## 2015-10-13 NOTE — Progress Notes (Signed)
Subjective: CC: meet new doctor HPI: Patient is a 70 y.o. female with a past medical history of HTN, CVA, COPD, hypothyroidism, bipolar, major depression, HLD presenting to clinic today for an appt to meet her new doctor. She is accompanied by her daughter.    Depression/bipolar: The patient's daughter asked that we prescribe her something for depression. She notes that the patient has been seen at [facility] a few times. She notes that she has ECT done there, the last time around March 2017.  She notes that she does not like [facility] because the patient's sister had a bad experience there and is unwilling to take her. The patient has had about experience at [facility] behavioral health refuses to go there. The daughter notes that she lives out of town and is unable to take her to several appointments.  Reportedly, the last time they were at [facility] they were told they should have a primary care physician who can take care of all of her medication needs including those for depression and bipolar. The patient has a history of suicidal ideation. She currently denies any suicidal ideation. No homicidal ideation, no auditory or visual hallucinations.  PHQ9 score 23, extremely difficult Her MDQ score was positive. The patient has been told she has bipolar in the past, but notes she has never had a manic episode.  Hypothyroidism: The patient was previously off all of her medication. She went to the emergency room was found to have a significantly elevated TSH. She has since started Synthroid again. This is been approximately 2 months ago. She continues to feel depressed despite starting the Synthroid. She requires a refill on Synthroid.  Poor appetite:  She had a few bites of peanut butter yesterday and an [employer] roast beef sandwich. She notes food don't taste good and she has no appetite.  She lives alone. She is worried about believes she has lost and asks if she can be admitted due to  weight loss. She asked me several times if she can die due to weight loss.  She denies any nausea, vomiting, abdominal pain. She notes she just has no appetite. She's been on Remeron in the past, put notes it makes her dizzy.  Constipation: The patient states that her last bowel movement was 3 days ago. She notes that her stools are sometimes softand sometimes hard. She notes straining with each bowel movement. Denies any severe or melena. She has not tried anything to help with constipation. No abdominal pain.   Social History: The patient lives alone, her sister lives in the same apartment complex. Her daughter lives out of town.   ROS: All other systems reviewed and are negative.  Past Medical History Patient Active Problem List   Diagnosis Date Noted  . Severe episode of recurrent major depressive disorder, without psychotic features ([place])   . Foreign body in foot, right 01/09/2015  . Blister of right ankle without infection 01/09/2015  . Sinusitis 11/26/2014  . Rotator cuff syndrome of right shoulder 11/14/2014  . Varicose veins of both lower extremities with pain 10/29/2014  . Neck pain 09/26/2014  . Metatarsal fracture 08/16/2014  . Pain in joint, shoulder region 08/16/2014  . UTI (urinary tract infection) 08/16/2014  . Facial skin lesion 08/01/2014  . Leg edema, left 07/25/2014  . Edema of left lower extremity 07/22/2014  . Essential hypertension   . Thyroid activity decreased   . HLD (hyperlipidemia)   . Healthcare maintenance 05/31/2014  . Emotional neurotic  disorder 05/07/2014  . Seborrheic keratosis 12/05/2013  . 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 At high risk for falls 06/28/2012  . Allergic rhinitis 06/16/2012  . Hearing loss 01/07/2011  . Foot pain 11/25/2010  . Thyroid nodule 10/16/2010  . URINARY INCONTINENCE, STRESS, FEMALE 04/03/2010  . HYPERLIPIDEMIA 03/25/2010  . PULMONARY NODULE 12/23/2009  . COPD (chronic obstructive pulmonary disease) ([place])  12/03/2009  . Tobacco abuse counseling 11/05/2009  . CVA 11/05/2009  . BIPOLAR DISORDER UNSPECIFIED 11/28/2008  . HYPOTHYROIDISM 05/06/2008  . OCD (obsessive compulsive disorder) 10/13/2006  . GERD 10/13/2006  . OSTEOPOROSIS 10/13/2006    Medications- reviewed and updated Current Outpatient Prescriptions  Medication Sig Dispense Refill  . aspirin 325 MG tablet Take 325 mg by mouth daily as needed for mild pain, moderate pain or headache.     [name] atorvastatin (LIPITOR) 40 MG tablet Take 1 tablet (40 mg total) by mouth daily. 30 tablet 11  . cephALEXin (KEFLEX) 500 MG capsule Take 1 capsule (500 mg total) by mouth 3 (three) times daily. 21 capsule 0  . fluticasone-salmeterol (ADVAIR HFA) 230-21 MCG/ACT inhaler Inhale 2 puffs into the lungs 2 (two) times daily. 1 Inhaler 12  . levothyroxine (SYNTHROID, LEVOTHROID) 75 MCG tablet Take 1 tablet (75 mcg total) by mouth once. 30 tablet 2  . lisinopril (PRINIVIL,ZESTRIL) 5 MG tablet TAKE 1 TABLET (5 MG TOTAL) BY MOUTH DAILY. 90 tablet 3  . loratadine (CLARITIN) 10 MG tablet Take 1 tablet (10 mg total) by mouth daily. (Patient not taking: Reported on 05/17/2015) 30 tablet 1  . meloxicam (MOBIC) 15 MG tablet Take 1 tablet (15 mg total) by mouth daily. (Patient not taking: Reported on 03/31/2015) 30 tablet 2  . methocarbamol (ROBAXIN) 500 MG tablet Take 1 tablet (500 mg total) by mouth 2 (two) times daily as needed for muscle spasms. (Patient not taking: Reported on 03/31/2015) 20 tablet 0  . mirtazapine (REMERON) 15 MG tablet Take 1 tablet (15 mg total) by mouth at bedtime. 30 tablet 0  . polyethylene glycol powder (GLYCOLAX/MIRALAX) powder Take 17 g by mouth 2 (two) times daily as needed for mild constipation. 3350 g 1  . traMADol (ULTRAM) 50 MG tablet Take 1 tablet (50 mg total) by mouth every 6 (six) hours as needed. (Patient not taking: Reported on 03/31/2015) 30 tablet 0  . vitamin E 400 UNIT capsule Take 2 capsules (800 Units total) by mouth daily.      No current facility-administered medications for this visit.    Objective: Office vital signs reviewed. BP 179/92 mmHg  Pulse 86  Temp(Src) 98.3 F (36.8 C) (Oral)  Ht 4\' 6"  (1.372 m)  Wt 88 lb (39.917 kg)  BMI 21.21 kg/m2  LMP 01/09/2012   Physical Examination:  General: Awake, alert, conically ill-appearing, nontoxic.  Neck: Thyromegaly without distinct nodules noted. Cardio: RRR, no m/r/g noted.  Pulm: No increased WOB.  CTAB, without wheezes, rhonchi or crackles noted.  GI: soft, NT/ND,+BS x4, no hepatomegaly, no splenomegaly Psych: Patient appears anxious. Stated mood depressed, congruent affect. No SI or HI. No AVH. Patient does not appear internally distracted. No pressured speech.  Assessment/Plan: Essential hypertension Not at goal, however the patient did not take her medications today. -Patient to follow-up in 2-4 weeks after taking her medication.  HYPOTHYROIDISM Refilled Synthroid today. Her last TSH was 5 years ago in the emergency room. We'll recheck TSH in 3 months.  BIPOLAR DISORDER UNSPECIFIED The patient has a beer a complicated past psychiatric history.  She has been told by Dr. Gwenlyn Saran in behavioral health clinic that she required more specialized training. Discussed this again with Dr. Gwenlyn Saran who felt that the patient would benefit more from a psychiatrist.  I discussed with the patient and her daughter that it would not be good care for myself or someone in our clinic to manage her depression and bipolar. The patient notes that she did well on Effexor and request this prescription. I note that it has been quite sometimes since she was on Effexor and that it would be more prudent for her to follow-up with a psychiatrist at this time. -Referral made to psychiatry -Provided patient with the number for [facility] who may be able to evaluate the patient and provide her with the continuity of care that the prefer. Additionally, she would then have transportation to her  daughter would not have to drive in town -Discussed return precautions and provided her the contact information for the mobile crisis number.   Constipation: possibly due to decreased by mouth intake and decreased fluid intake. We will start the patient on a MiraLAX regimen. Discussed return precautions.  Orders Placed This Encounter  Procedures  . Ambulatory referral to Psychiatry    Referral Priority:  Routine    Referral Type:  Psychiatric    Referral Reason:  Specialty Services Required    Requested Specialty:  Psychiatry    Number of Visits Requested:  1    Meds ordered this encounter  Medications  . polyethylene glycol powder (GLYCOLAX/MIRALAX) powder    Sig: Take 17 g by mouth 2 (two) times daily as needed for mild constipation.    Dispense:  3350 g    Refill:  1  . levothyroxine (SYNTHROID, LEVOTHROID) 75 MCG tablet    Sig: Take 1 tablet (75 mcg total) by mouth once.    Dispense:  30 tablet    Refill:  [place] PGY-2, [place]

## 2015-10-13 NOTE — Patient Instructions (Addendum)
Call [facility] about possibly qualifying for their program [facility] [address] [place], [place] [address] Office: [phone] FAX: [phone] [place] Service: [phone]  Start taking Miralax 1-2 times per day as needed for constipation.  If you have thoughts of harming yourself or others, you have options: contact family/friends, our clinic, go to the emergency room, call our clinic, or call the [facility] (Toll Free): [phone] that is available day or night.

## 2015-10-14 NOTE — Assessment & Plan Note (Signed)
The patient has a beer a complicated past psychiatric history.  She has been told by Dr. Gwenlyn Saran in behavioral health clinic that she required more specialized training. Discussed this again with Dr. Gwenlyn Saran who felt that the patient would benefit more from a psychiatrist.  I discussed with the patient and her daughter that it would not be good care for myself or someone in our clinic to manage her depression and bipolar. The patient notes that she did well on Effexor and request this prescription. I note that it has been quite sometimes since she was on Effexor and that it would be more prudent for her to follow-up with a psychiatrist at this time. -Referral made to psychiatry -Provided patient with the number for [facility] who may be able to evaluate the patient and provide her with the continuity of care that the prefer. Additionally, she would then have transportation to her daughter would not have to drive in town -Discussed return precautions and provided her the contact information for the mobile crisis number.

## 2015-10-14 NOTE — Assessment & Plan Note (Signed)
Refilled Synthroid today. Her last TSH was 5 years ago in the emergency room. We'll recheck TSH in 3 months.

## 2015-10-14 NOTE — Assessment & Plan Note (Signed)
Not at goal, however the patient did not take her medications today. -Patient to follow-up in 2-4 weeks after taking her medication.

## 2015-10-17 ENCOUNTER — Telehealth: Payer: Self-pay | Admitting: *Deleted

## 2015-10-17 DIAGNOSIS — F319 Bipolar disorder, unspecified: Secondary | ICD-10-CM

## 2015-10-17 NOTE — Telephone Encounter (Signed)
Re-entered referral, please let me know if you still cant see it.  Thanks, Archie Patten, MD [facility] Family Medicine Resident  10/17/2015, 1:49 PM

## 2015-10-17 NOTE — Telephone Encounter (Signed)
This patient was referred to psychiatry on 7/10- I am unable to tell if there's been any change to the referral status at this time- if we could please follow up on this.  Thanks, Archie Patten, MD [facility] Resident  10/17/2015, 11:33 AM

## 2015-10-17 NOTE — Addendum Note (Signed)
Addended by: Archie Patten on: 10/17/2015 01:49 PM   Modules accepted: Orders

## 2015-10-17 NOTE — Telephone Encounter (Signed)
Patient daughter wanted to informed PCP that patient has decided not to go with the [facility] program and would like to proceed with psych referral to someone that takes medicare.

## 2015-10-17 NOTE — Telephone Encounter (Signed)
Psych. referral not showing up under the referrals tab or in workqueue. Could you please re-enter?

## 2015-10-22 NOTE — Telephone Encounter (Signed)
Referral faxed to [facility]

## 2015-10-27 ENCOUNTER — Emergency Department (HOSPITAL_COMMUNITY)
Admission: EM | Admit: 2015-10-27 | Discharge: 2015-10-27 | Disposition: A | Discharge status: Home / Self Care | Payer: Medicare Other | Attending: Emergency Medicine | Admitting: Emergency Medicine

## 2015-10-27 DIAGNOSIS — Z5321 Procedure and treatment not carried out due to patient leaving prior to being seen by health care provider: Secondary | ICD-10-CM | POA: Diagnosis not present

## 2015-10-27 DIAGNOSIS — R531 Weakness: Secondary | ICD-10-CM | POA: Insufficient documentation

## 2015-10-27 DIAGNOSIS — Z7982 Long term (current) use of aspirin: Secondary | ICD-10-CM | POA: Diagnosis not present

## 2015-10-27 DIAGNOSIS — J449 Chronic obstructive pulmonary disease, unspecified: Secondary | ICD-10-CM | POA: Insufficient documentation

## 2015-10-27 DIAGNOSIS — R627 Adult failure to thrive: Secondary | ICD-10-CM | POA: Diagnosis not present

## 2015-10-27 DIAGNOSIS — R404 Transient alteration of awareness: Secondary | ICD-10-CM | POA: Diagnosis not present

## 2015-10-27 DIAGNOSIS — F17210 Nicotine dependence, cigarettes, uncomplicated: Secondary | ICD-10-CM | POA: Diagnosis not present

## 2015-10-27 DIAGNOSIS — I10 Essential (primary) hypertension: Secondary | ICD-10-CM | POA: Diagnosis not present

## 2015-10-27 LAB — CBC
HCT: 41.6 % (ref 36.0–46.0)
Hemoglobin: 13.7 g/dL (ref 12.0–15.0)
MCH: 32.4 pg (ref 26.0–34.0)
MCHC: 32.9 g/dL (ref 30.0–36.0)
MCV: 98.3 fL (ref 78.0–100.0)
Platelets: 326 10*3/uL (ref 150–400)
RBC: 4.23 MIL/uL (ref 3.87–5.11)
RDW: 12.8 % (ref 11.5–15.5)
WBC: 5.6 10*3/uL (ref 4.0–10.5)

## 2015-10-27 LAB — BASIC METABOLIC PANEL
Anion gap: 11 (ref 5–15)
BUN: 18 mg/dL (ref 6–20)
CO2: 28 mmol/L (ref 22–32)
Calcium: 10.2 mg/dL (ref 8.9–10.3)
Chloride: 102 mmol/L (ref 101–111)
Creatinine, Ser: 0.88 mg/dL (ref 0.44–1.00)
GFR calc Af Amer: >60 mL/min (ref >60)
GFR calc non Af Amer: >60 mL/min (ref >60)
Glucose, Bld: 104 mg/dL — ABNORMAL HIGH (ref 65–99)
Potassium: 3.1 mmol/L — ABNORMAL LOW (ref 3.5–5.1)
Sodium: 141 mmol/L (ref 135–145)
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
## 2015-10-27 NOTE — ED Triage Notes (Signed)
Per EMS: pt coming from home with c/o weakness for 3 months, and head congestion

## 2015-10-30 DIAGNOSIS — F332 Major depressive disorder, recurrent severe without psychotic features: Secondary | ICD-10-CM | POA: Diagnosis not present

## 2015-11-21 ENCOUNTER — Ambulatory Visit: Payer: Self-pay | Admitting: Internal Medicine

## 2015-11-25 ENCOUNTER — Emergency Department (HOSPITAL_COMMUNITY)
Admission: EM | Admit: 2015-11-25 | Discharge: 2015-11-26 | Disposition: A | Discharge status: Home / Self Care | Payer: Medicare Other | Attending: Emergency Medicine | Admitting: Emergency Medicine

## 2015-11-25 ENCOUNTER — Encounter (HOSPITAL_COMMUNITY): Payer: Self-pay | Admitting: Emergency Medicine

## 2015-11-25 DIAGNOSIS — I10 Essential (primary) hypertension: Secondary | ICD-10-CM | POA: Insufficient documentation

## 2015-11-25 DIAGNOSIS — R630 Anorexia: Secondary | ICD-10-CM | POA: Diagnosis not present

## 2015-11-25 DIAGNOSIS — E039 Hypothyroidism, unspecified: Secondary | ICD-10-CM | POA: Insufficient documentation

## 2015-11-25 DIAGNOSIS — F3132 Bipolar disorder, current episode depressed, moderate: Secondary | ICD-10-CM | POA: Diagnosis present

## 2015-11-25 DIAGNOSIS — J449 Chronic obstructive pulmonary disease, unspecified: Secondary | ICD-10-CM | POA: Diagnosis not present

## 2015-11-25 DIAGNOSIS — F17210 Nicotine dependence, cigarettes, uncomplicated: Secondary | ICD-10-CM | POA: Diagnosis not present

## 2015-11-25 DIAGNOSIS — R531 Weakness: Secondary | ICD-10-CM | POA: Diagnosis not present

## 2015-11-25 DIAGNOSIS — Z79899 Other long term (current) drug therapy: Secondary | ICD-10-CM | POA: Insufficient documentation

## 2015-11-25 DIAGNOSIS — R45851 Suicidal ideations: Secondary | ICD-10-CM | POA: Diagnosis not present

## 2015-11-25 DIAGNOSIS — Z7982 Long term (current) use of aspirin: Secondary | ICD-10-CM | POA: Diagnosis not present

## 2015-11-25 DIAGNOSIS — R5383 Other fatigue: Secondary | ICD-10-CM

## 2015-11-25 DIAGNOSIS — F32A Depression, unspecified: Secondary | ICD-10-CM

## 2015-11-25 DIAGNOSIS — F329 Major depressive disorder, single episode, unspecified: Secondary | ICD-10-CM | POA: Insufficient documentation

## 2015-11-25 DIAGNOSIS — F488 Other specified nonpsychotic mental disorders: Secondary | ICD-10-CM | POA: Diagnosis not present

## 2015-11-25 DIAGNOSIS — F5089 Other specified eating disorder: Secondary | ICD-10-CM | POA: Diagnosis not present

## 2015-11-25 DIAGNOSIS — F429 Obsessive-compulsive disorder, unspecified: Secondary | ICD-10-CM | POA: Diagnosis present

## 2015-11-25 DIAGNOSIS — R404 Transient alteration of awareness: Secondary | ICD-10-CM | POA: Diagnosis not present

## 2015-11-25 LAB — CBC WITH DIFFERENTIAL/PLATELET
Basophils Absolute: 0 10*3/uL (ref 0.0–0.1)
Basophils Relative: 1 %
Eosinophils Absolute: 0.1 10*3/uL (ref 0.0–0.7)
Eosinophils Relative: 1 %
HCT: 39.8 % (ref 36.0–46.0)
Hemoglobin: 13.1 g/dL (ref 12.0–15.0)
Lymphocytes Relative: 21 %
Lymphs Abs: 1.2 10*3/uL (ref 0.7–4.0)
MCH: 32.4 pg (ref 26.0–34.0)
MCHC: 32.9 g/dL (ref 30.0–36.0)
MCV: 98.5 fL (ref 78.0–100.0)
Monocytes Absolute: 0.4 10*3/uL (ref 0.1–1.0)
Monocytes Relative: 7 %
Neutro Abs: 4 10*3/uL (ref 1.7–7.7)
Neutrophils Relative %: 70 %
Platelets: 273 10*3/uL (ref 150–400)
RBC: 4.04 MIL/uL (ref 3.87–5.11)
RDW: 12.8 % (ref 11.5–15.5)
WBC: 5.7 10*3/uL (ref 4.0–10.5)

## 2015-11-25 LAB — URINALYSIS, ROUTINE W REFLEX MICROSCOPIC
Bilirubin Urine: NEGATIVE
Glucose, UA: NEGATIVE mg/dL
Hgb urine dipstick: NEGATIVE
Ketones, ur: NEGATIVE mg/dL
Nitrite: NEGATIVE
Protein, ur: NEGATIVE mg/dL
Specific Gravity, Urine: 1.019 (ref 1.005–1.030)
pH: 6 (ref 5.0–8.0)

## 2015-11-25 LAB — URINE MICROSCOPIC-ADD ON

## 2015-11-25 LAB — COMPREHENSIVE METABOLIC PANEL
ALT: 10 U/L — ABNORMAL LOW (ref 14–54)
AST: 16 U/L (ref 15–41)
Albumin: 3.8 g/dL (ref 3.5–5.0)
Alkaline Phosphatase: 79 U/L (ref 38–126)
Anion gap: 5 (ref 5–15)
BUN: 28 mg/dL — ABNORMAL HIGH (ref 6–20)
CO2: 29 mmol/L (ref 22–32)
Calcium: 9.5 mg/dL (ref 8.9–10.3)
Chloride: 108 mmol/L (ref 101–111)
Creatinine, Ser: 0.86 mg/dL (ref 0.44–1.00)
GFR calc Af Amer: >60 mL/min (ref >60)
GFR calc non Af Amer: >60 mL/min (ref >60)
Glucose, Bld: 97 mg/dL (ref 65–99)
Potassium: 4.2 mmol/L (ref 3.5–5.1)
Sodium: 142 mmol/L (ref 135–145)
Total Bilirubin: 0.8 mg/dL (ref 0.3–1.2)
Total Protein: 6.4 g/dL — ABNORMAL LOW (ref 6.5–8.1)

## 2015-11-25 LAB — TSH: TSH: 1.269 u[IU]/mL (ref 0.350–4.500)

## 2015-11-25 LAB — LIPASE, BLOOD: Lipase: 44 U/L (ref 11–51)

## 2015-11-25 LAB — T4, FREE: Free T4: 1.19 ng/dL — ABNORMAL HIGH (ref 0.61–1.12)

## 2015-11-25 MED ORDER — ONDANSETRON HCL 4 MG PO TABS: 4.0000 mg | ORAL_TABLET | Freq: Three times a day (TID) | ORAL | Status: DC | PRN

## 2015-11-25 MED ORDER — ALUM & MAG HYDROXIDE-SIMETH 200-200-20 MG/5ML PO SUSP: 30.0000 mL | ORAL | Status: DC | PRN

## 2015-11-25 MED ORDER — OLANZAPINE 2.5 MG PO TABS
2.5000 mg | ORAL_TABLET | Freq: Every day | ORAL | Status: DC
  Administered 2015-11-26: 2.5 mg via ORAL
  Filled 2015-11-25: qty 1

## 2015-11-25 MED ORDER — ASPIRIN EC 325 MG PO TBEC
325.0000 mg | DELAYED_RELEASE_TABLET | Freq: Every day | ORAL | Status: DC
  Administered 2015-11-26: 325 mg via ORAL
  Filled 2015-11-25 (×2): qty 1

## 2015-11-25 MED ORDER — LEVOTHYROXINE SODIUM 75 MCG PO TABS
75.0000 ug | ORAL_TABLET | Freq: Every day | ORAL | Status: DC
  Administered 2015-11-26: 75 ug via ORAL
  Filled 2015-11-25: qty 1

## 2015-11-25 MED ORDER — BOOST / RESOURCE BREEZE PO LIQD
1.0000 | Freq: Three times a day (TID) | ORAL | Status: DC
  Administered 2015-11-25 – 2015-11-26 (×2): 1 via ORAL
  Filled 2015-11-25 (×4): qty 1

## 2015-11-25 MED ORDER — ACETAMINOPHEN 325 MG PO TABS: 650.0000 mg | ORAL_TABLET | ORAL | Status: DC | PRN

## 2015-11-25 MED ORDER — VORTIOXETINE HBR 10 MG PO TABS
10.0000 mg | ORAL_TABLET | Freq: Every day | ORAL | Status: DC
  Administered 2015-11-26: 10 mg via ORAL
  Filled 2015-11-25 (×2): qty 1
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
## 2015-11-25 NOTE — ED Triage Notes (Signed)
Patient presents from home via ems for generalized weakness and decrease appetite x10 months. A&O x4.   Last VS: 133/87, 94%, 80hr, [id number]

## 2015-11-25 NOTE — ED Provider Notes (Signed)
[facility] DEPT Provider Note   CSN: [id number] Arrival date & time: 11/25/15  1438     History   Chief Complaint Chief Complaint  Patient presents with  . Generalized weakness    HPI [name] is a 70 y.o. female.  [name] is a 70 y.o. Female who presents to the emergency department complaining of generalized fatigue ongoing for the past 10 months. Patient reports she's had decreased appetite and feels that she is malnourished. She tells me she drinks unsweetened tea and boost. She also complains of nasal congestion and postnasal drip. She also tells me she has had some suicidal thoughts. She tells me she just wants to die in her sleep. She does not want to suffer any longer. She denies any plan for suicide. She reports she has been taking her Synthroid as prescribed every morning. Patient denies fevers, coughing, shortness of breath, chest pain, abdominal pain, nausea, vomiting, diarrhea, rashes, focal weakness, headache, changes to her vision, or neck pain. Patient does have chronic anisocoria status post eye surgery.   The history is provided by the patient and medical records. No language interpreter was used.    Past Medical History:  Diagnosis Date  . Anxiety   . COPD (chronic obstructive pulmonary disease) (HCC)    Due to long history of tobacco abuse, still smoking  . Depression   . Diverticulosis of colon   . GERD (gastroesophageal reflux disease)   . Heart murmur   . History of uterine prolapse 10/2004   Dr Cletis Media  . Hypertension   . OCD (obsessive compulsive disorder)   . Osteoporosis   . PUD (peptic ulcer disease) 09/2006   H pylori  . Thyroid disease    Ho R thyroid noduel plus mild hyperthyroidism. Treated with radioiodine therapty on 04/2006. Dr Estrella Myrtle.    Patient Active Problem List   Diagnosis Date Noted  . Severe episode of recurrent major depressive disorder, without psychotic features ([place])   . Foreign body in foot, right 01/09/2015  .  Blister of right ankle without infection 01/09/2015  . Sinusitis 11/26/2014  . Rotator cuff syndrome of right shoulder 11/14/2014  . Varicose veins of both lower extremities with pain 10/29/2014  . Neck pain 09/26/2014  . Metatarsal fracture 08/16/2014  . Pain in joint, shoulder region 08/16/2014  . UTI (urinary tract infection) 08/16/2014  . Facial skin lesion 08/01/2014  . Leg edema, left 07/25/2014  . Edema of left lower extremity 07/22/2014  . Essential hypertension   . Thyroid activity decreased   . HLD (hyperlipidemia)   . Healthcare maintenance 05/31/2014  . Emotional neurotic disorder 05/07/2014  . Seborrheic keratosis 12/05/2013  . 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 At high risk for falls 06/28/2012  . Allergic rhinitis 06/16/2012  . Hearing loss 01/07/2011  . Foot pain 11/25/2010  . Thyroid nodule 10/16/2010  . URINARY INCONTINENCE, STRESS, FEMALE 04/03/2010  . HYPERLIPIDEMIA 03/25/2010  . PULMONARY NODULE 12/23/2009  . COPD (chronic obstructive pulmonary disease) ([place]) 12/03/2009  . Tobacco abuse counseling 11/05/2009  . CVA 11/05/2009  . BIPOLAR DISORDER UNSPECIFIED 11/28/2008  . HYPOTHYROIDISM 05/06/2008  . OCD (obsessive compulsive disorder) 10/13/2006  . GERD 10/13/2006  . OSTEOPOROSIS 10/13/2006    Past Surgical History:  Procedure Laterality Date  . ABDOMINAL HYSTERECTOMY    . BIOPSY THYROID  08/2008   Atypia and Hurtle cells, partial thyroidectomy   . BLADDER SUSPENSION     mesh sling  . Carotid ultrasound  09/10/09  No significant extracranial carotid artery stenosis, vertebrals are patent with antegrade flow.   . COLONOSCOPY  06/03/2005   Hyperplastic polyps, sigmoid diverticulosis, internal hemorroids  . FOREIGN BODY REMOVAL Right 02/21/2015   Procedure: RIGHT FOOT FOREIGN BODY REMOVAL ;  Surgeon: Marchia Bond, MD;  Location: [facility];  Service: Orthopedics;  Laterality: Right;  . Ganglion removal  12/2005   R wrist  subretinacular dorsal ganglion  . MRI Head  09/20/09   No acute intracranial abn.  Signal abnormality suggestive of chronic small vessel ischemia, maximal in the right subinsular white and deep gray mater.   [name] PFT  11/2006   Obstructive pattern.  Poor response to bronchodilators.   . THYROIDECTOMY, PARTIAL  08/2008   Dr Ronnald Collum  . TONSILLECTOMY AND ADENOIDECTOMY  10/2004  . TRANSTHORACIC ECHOCARDIOGRAM  123456   Normal systolic function.  EF 55-60%.  Mild MR, atria ok, trivial pericardial effusion  . TVT  06/15/10   Tension-free Vaginal Tape, Dr Mancel Bale, for urge incontinence    OB History    Gravida Para Term Preterm AB Living   2         2   SAB TAB Ectopic Multiple Live Births                   Home Medications    Prior to Admission medications   Medication Sig Start Date End Date Taking? Authorizing Provider  aspirin EC 325 MG tablet Take 325 mg by mouth daily.   Yes Historical Provider, MD  levothyroxine (SYNTHROID, LEVOTHROID) 75 MCG tablet Take 75 mcg by mouth daily before breakfast.   Yes Historical Provider, MD  OLANZapine (ZYPREXA) 5 MG tablet Take 2.5 mg by mouth at bedtime.   Yes Historical Provider, MD  vortioxetine HBr (TRINTELLIX) 10 MG TABS Take 10 mg by mouth daily.   Yes Historical Provider, MD  cephALEXin (KEFLEX) 500 MG capsule Take 1 capsule (500 mg total) by mouth 3 (three) times daily. Patient not taking: Reported on 11/25/2015 10/08/15   Leo Grosser, MD  mirtazapine (REMERON) 15 MG tablet Take 1 tablet (15 mg total) by mouth at bedtime. Patient not taking: Reported on 11/25/2015 09/21/15   Carmin Muskrat, MD  polyethylene glycol powder (GLYCOLAX/MIRALAX) powder Take 17 g by mouth 2 (two) times daily as needed for mild constipation. Patient not taking: Reported on 11/25/2015 10/13/15   Archie Patten, MD    Family History Family History  Problem Relation Age of Onset  . Heart disease Mother   . Cirrhosis Sister   . Hypertension Sister   . Heart  disease Maternal Grandmother   . Heart disease Maternal Grandfather   . Other Neg Hx     Social History Social History  Substance Use Topics  . Smoking status: Current Every Day Smoker    Packs/day: 0.25    Years: 35.00    Types: Cigarettes  . Smokeless tobacco: Never Used     Comment: smokes less than 1 pack per week. ready to quitt.  . Alcohol use No     Allergies   Seroquel [quetiapine fumarate]; Amoxicillin; Ciprofloxacin; Haloperidol lactate; Ketorolac tromethamine; Latuda [lurasidone hcl]; Thorazine [chlorpromazine]; and Wellbutrin [bupropion]   Review of Systems Review of Systems  Constitutional: Positive for appetite change and fatigue. Negative for chills and fever.  HENT: Positive for postnasal drip, rhinorrhea and sneezing. Negative for congestion and sore throat.   Eyes: Negative for visual disturbance.  Respiratory: Negative for cough, shortness of breath  and wheezing.   Cardiovascular: Negative for chest pain.  Gastrointestinal: Negative for abdominal pain, diarrhea, nausea and vomiting.  Genitourinary: Negative for difficulty urinating, dysuria, frequency, hematuria and urgency.  Musculoskeletal: Negative for back pain and neck pain.  Skin: Negative for rash.  Neurological: Negative for dizziness, weakness, light-headedness, numbness and headaches.  Psychiatric/Behavioral: Positive for suicidal ideas.     Physical Exam Updated Vital Signs BP 119/87 (BP Location: Right Arm)   Pulse 91   Temp 98.8 F (37.1 C) (Oral)   Resp 16   LMP 01/09/2012   SpO2 92%   Physical Exam  Constitutional: She is oriented to person, place, and time. She appears well-developed and well-nourished. No distress.  Nontoxic appearing.  HENT:  Head: Normocephalic and atraumatic.  Right Ear: External ear normal.  Left Ear: External ear normal.  Mouth/Throat: Oropharynx is clear and moist.  mucous membranes are moist.  Eyes: Conjunctivae and EOM are normal. Right eye exhibits  no discharge. Left eye exhibits no discharge.  Right pupil is slightly larger than her left. Patient states this is chronic.  Neck: Normal range of motion. Neck supple. No JVD present.  Cardiovascular: Normal rate, regular rhythm, normal heart sounds and intact distal pulses.  Exam reveals no gallop and no friction rub.   No murmur heard. Pulmonary/Chest: Effort normal and breath sounds normal. No stridor. No respiratory distress. She has no wheezes. She has no rales.  Abdominal: Soft. Bowel sounds are normal. She exhibits no distension. There is tenderness. There is no rebound and no guarding.  Patient has mild epigastric tenderness to palpation. No peritoneal signs. Abdomen is soft. Bowel sounds are present.  Musculoskeletal: She exhibits no edema.  Patient is spontaneously moving all extremities in a coordinated fashion exhibiting good strength.   Lymphadenopathy:    She has no cervical adenopathy.  Neurological: She is alert and oriented to person, place, and time. No cranial nerve deficit. Coordination normal.  Patient is alert and oriented 3. Cranial nerves are intact. Speech is clear and coherent. Good strength in her bilateral upper and lower extremities.  Skin: Skin is warm and dry. Capillary refill takes less than 2 seconds. No rash noted. She is not diaphoretic. No erythema. No pallor.  Psychiatric: She has a normal mood and affect. Her behavior is normal. Her speech is not rapid and/or pressured. She expresses suicidal ideation. She expresses no homicidal ideation. She expresses no suicidal plans.  Patient endorses suicidal ideations without a plan. She reports she feels she just wants to die so she no longer suffers. She does not appear depressed or anxious during my interview. She has good eye contact. Her speech is clear and coherent.  Nursing note and vitals reviewed.    ED Treatments / Results  Labs (all labs ordered are listed, but only abnormal results are displayed) Labs  Reviewed  COMPREHENSIVE METABOLIC PANEL - Abnormal; Notable for the following:       Result Value   BUN 28 (*)    Total Protein 6.4 (*)    ALT 10 (*)    All other components within normal limits  URINALYSIS, ROUTINE W REFLEX MICROSCOPIC (NOT AT [facility]) - Abnormal; Notable for the following:    Leukocytes, UA SMALL (*)    All other components within normal limits  T4, FREE - Abnormal; Notable for the following:    Free T4 1.19 (*)    All other components within normal limits  URINE MICROSCOPIC-ADD ON - Abnormal; Notable for the following:  Squamous Epithelial / LPF 0-5 (*)    Bacteria, UA RARE (*)    All other components within normal limits  URINE CULTURE  CBC WITH DIFFERENTIAL/PLATELET  TSH  LIPASE, BLOOD    EKG  EKG Interpretation  Date/Time:  Tuesday November 25 2015 16:07:08 EDT Ventricular Rate:  78 PR Interval:    QRS Duration: 84 QT Interval:  381 QTC Calculation: 432 R Axis:   97 Text Interpretation:  Sinus rhythm Non-specific ST-t changes Confirmed by Ashok Cordia  MD, Lennette Bihari ([id number]) on 11/25/2015 5:27:55 PM       Radiology No results found.  Procedures Procedures (including critical care time)  Medications Ordered in ED Medications  feeding supplement (BOOST / RESOURCE BREEZE) liquid 1 Container (1 Container Oral Not Given 11/25/15 2046)  alum & mag hydroxide-simeth (MAALOX/MYLANTA) 200-200-20 MG/5ML suspension 30 mL (not administered)  ondansetron (ZOFRAN) tablet 4 mg (not administered)  acetaminophen (TYLENOL) tablet 650 mg (not administered)  aspirin EC tablet 325 mg (325 mg Oral Not Given 11/26/15 0036)  levothyroxine (SYNTHROID, LEVOTHROID) tablet 75 mcg (not administered)  OLANZapine (ZYPREXA) tablet 2.5 mg (2.5 mg Oral Given 11/26/15 0034)  vortioxetine HBr (TRINTELLIX) TABS 10 mg (10 mg Oral Not Given 11/26/15 0037)     Initial Impression / Assessment and Plan / ED Course  I have reviewed the triage vital signs and the nursing notes.  Pertinent labs &  imaging results that were available during my care of the patient were reviewed by me and considered in my medical decision making (see chart for details).  Clinical Course   Patient presented complaining of generalized fatigue for the past 10 months. She reports decreased appetite and feeling that she is malnourished. She tells me she drinks only unsweetened tea and boost. She also tells me she's had suicidal ideations and just wants to die. She reports she no longer wants to suffer. She has a long history of mental illness. On exam the patient is afebrile nontoxic appearing. Urinalysis is nitrite negative. Small leukocytes. Urine sent for culture. Patient without urinary symptoms. CMP is unremarkable. CBC shows no leukocytosis. TSH is within normal limits. Lipase is within normal limits. Patient is medically clear for [facility] admission.  Behavioral Health recommends geriatric psych placement. I discussed these findings with the patient. She agrees to voluntarily stay for placement and geriatric psychiatry. Patient ate an entire [place] sandwich, some applesauce and ginger ale in the emergency department. Psychiatric holding orders placed.  Final Clinical Impressions(s) / ED Diagnoses   Final diagnoses:  Fatigue due to depression  Decreased appetite  Suicidal ideations    New Prescriptions New Prescriptions   No medications on file     Waynetta Pean, PA-C 11/26/15 0255    Lajean Saver, MD 11/27/15 1024

## 2015-11-25 NOTE — ED Notes (Signed)
Pt has in belonging bag:  Black long sleeve shirt, green dress, brown lace underwear, black shoes, and brown purse.

## 2015-11-25 NOTE — ED Notes (Signed)
TTS at bedside. 

## 2015-11-25 NOTE — ED Notes (Signed)
Patient refuses to drink boost, states she only likes vanilla. PA notified.

## 2015-11-26 ENCOUNTER — Emergency Department (HOSPITAL_COMMUNITY): Payer: Medicare Other

## 2015-11-26 DIAGNOSIS — F3132 Bipolar disorder, current episode depressed, moderate: Secondary | ICD-10-CM | POA: Diagnosis not present

## 2015-11-26 NOTE — BH Assessment (Signed)
[facility] Assessment Progress Note  Per Corena Pilgrim, MD, this pt does not require psychiatric hospitalization at this time.  Pt is to be discharged from [facility] with recommendation to follow up with [facility], her current outpatient provider.  This has been included in pt's discharge instructions.  Pt's nurse has been notified.  Jalene Mullet, [facility]mal Triage Specialist [phone]
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
## 2015-11-26 NOTE — ED Notes (Signed)
Pt asleep with non-labored breathing and symmetrical chest rise and fall.

## 2015-11-26 NOTE — Discharge Instructions (Signed)
For your ongoing mental health needs, you are advised to continue treatment at [facility].  If you do not currently have an appointment, new and returning patients are seen at their walk-in clinic.  Walk-in hours are Monday - Friday from 8:00 am - 3:00 pm.  Walk-in patients are seen on a first come, first served basis.  Try to arrive as early as possible for he best chance of being seen the same day:       [facility]      [address]      [place], [place] [address]      [phone]

## 2015-11-26 NOTE — [facility] Suicide Risk Assessment (Signed)
Suicide Risk Assessment  Discharge Assessment   [facility] Discharge Suicide Risk Assessment   Principal Problem: Bipolar affective disorder, currently depressed, moderate ([place]) Discharge Diagnoses:  Patient Active Problem List   Diagnosis Date Noted  . Bipolar affective disorder, currently depressed, moderate ([place]) [F31.32] 11/26/2015    Priority: High  . OCD (obsessive compulsive disorder) [F42.9] 10/13/2006    Priority: High  . BIPOLAR DISORDER UNSPECIFIED [F31.9] 11/28/2008    Priority: Low  . Severe episode of recurrent major depressive disorder, without psychotic features ([place]) [F33.2]   . Foreign body in foot, right [S90.851A] 01/09/2015  . Blister of right ankle without infection [S90.521A] 01/09/2015  . Sinusitis [J32.9] 11/26/2014  . Rotator cuff syndrome of right shoulder [M75.101] 11/14/2014  . Varicose veins of both lower extremities with pain [I83.813] 10/29/2014  . Neck pain [M54.2] 09/26/2014  . Metatarsal fracture [S92.309A] 08/16/2014  . Pain in joint, shoulder region [M25.519] 08/16/2014  . UTI (urinary tract infection) [N39.0] 08/16/2014  . Facial skin lesion [L98.9] 08/01/2014  . Leg edema, left [R60.0] 07/25/2014  . Edema of left lower extremity [R60.0] 07/22/2014  . Essential hypertension [I10]   . Thyroid activity decreased [E03.9]   . HLD (hyperlipidemia) [E78.5]   . Healthcare maintenance [Z00.00] 05/31/2014  . Emotional neurotic disorder [F48.9] 05/07/2014  . Seborrheic keratosis [L82.1] 12/05/2013  . 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 At high risk for falls [R29.6] 06/28/2012  . Allergic rhinitis [J30.9] 06/16/2012  . Hearing loss [389] 01/07/2011  . Foot pain [M79.673] 11/25/2010  . Thyroid nodule [E04.1] 10/16/2010  . URINARY INCONTINENCE, STRESS, FEMALE [N39.3] 04/03/2010  . HYPERLIPIDEMIA [E78.5] 03/25/2010  . PULMONARY NODULE [J98.4] 12/23/2009  . COPD (chronic obstructive pulmonary disease) ([place]) [J44.9] 12/03/2009  . Tobacco  abuse counseling [Z71.6] 11/05/2009  . CVA [I63.50] 11/05/2009  . HYPOTHYROIDISM [E03.9] 05/06/2008  . GERD [K21.9] 10/13/2006  . OSTEOPOROSIS [M81.0] 10/13/2006    Total Time spent with patient: 45 minutes  Musculoskeletal: Strength & Muscle Tone: within normal limits Gait & Station: normal Patient leans: N/A  Psychiatric Specialty Exam: Physical Exam  Constitutional: She is oriented to person, place, and time. She appears well-developed and well-nourished.  HENT:  Head: Normocephalic.  Neck: Normal range of motion.  Respiratory: Effort normal.  Musculoskeletal: Normal range of motion.  Neurological: She is alert and oriented to person, place, and time.  Skin: Skin is warm and dry.  Psychiatric: Her speech is normal and behavior is normal. Judgment and thought content normal. Cognition and memory are normal. She exhibits a depressed mood.    Review of Systems  Constitutional: Negative.   HENT: Negative.   Eyes: Negative.   Respiratory: Negative.   Cardiovascular: Negative.   Gastrointestinal: Negative.   Genitourinary: Negative.   Musculoskeletal: Negative.   Skin: Negative.   Neurological: Negative.   Endo/Heme/Allergies: Negative.   Psychiatric/Behavioral: Positive for depression.    Blood pressure 123/72, pulse 74, temperature 97.9 F (36.6 C), temperature source Oral, resp. rate 15, last menstrual period 01/09/2012, SpO2 94 %.There is no height or weight on file to calculate BMI.  General Appearance: Casual  Eye Contact:  Good  Speech:  Normal Rate  Volume:  Normal  Mood:  Depressed  Affect:  Congruent  Thought Process:  Coherent and Descriptions of Associations: Intact  Orientation:  Full (Time, Place, and Person)  Thought Content:  WDL  Suicidal Thoughts:  No  Homicidal Thoughts:  No  Memory:  Immediate;   Good Recent;   Good Remote;  Good  Judgement:  Good  Insight:  Fair  Psychomotor Activity:  Normal  Concentration:  Concentration: Good and  Attention Span: Good  Recall:  Good  Fund of Knowledge:  Good  Language:  Good  Akathisia:  No  Handed:  Right  AIMS (if indicated):     Assets:  Housing Leisure Time Physical Health Resilience  ADL's:  Intact  Cognition:  WNL  Sleep:      Mental Status Per Nursing Assessment::   On Admission:   Suicidal ideations  Demographic Factors:  Age 55 or older, Caucasian and Living alone  Loss Factors: NA  Historical Factors: NA  Risk Reduction Factors:   Sense of responsibility to family, Positive social support and Positive therapeutic relationship  Continued Clinical Symptoms:  Depression, mild to moderate  Cognitive Features That Contribute To Risk:  None    Suicide Risk:  Minimal: No identifiable suicidal ideation.  Patients presenting with no risk factors but with morbid ruminations; may be classified as minimal risk based on the severity of the depressive symptoms    Plan Of Care/Follow-up recommendations:  Activity:  as tolerated Diet:  heart healthy diet  Dana Cornacchia, NP 11/26/2015, 10:05 AM

## 2015-11-26 NOTE — ED Notes (Signed)
She arouses easily and is oriented x 4.  She is reserved and pensive in her demeanor.  She had written a note presumably during the night which sits on her bedside table which states "To my doctor, I do not want to go to a hospital; I think I should go home and give my new medicines more time to work".  She takes her med. (synthroid) at this time and has no further requests.

## 2015-11-26 NOTE — ED Notes (Signed)
Pt states that she had a chest Xray recently and is refusing to get one.

## 2015-11-27 LAB — URINE CULTURE: Culture: NO GROWTH

## 2015-12-02 DIAGNOSIS — K219 Gastro-esophageal reflux disease without esophagitis: Secondary | ICD-10-CM | POA: Diagnosis not present

## 2015-12-02 DIAGNOSIS — R634 Abnormal weight loss: Secondary | ICD-10-CM | POA: Diagnosis not present

## 2015-12-02 DIAGNOSIS — F5000 Anorexia nervosa, unspecified: Secondary | ICD-10-CM | POA: Diagnosis not present

## 2015-12-15 ENCOUNTER — Other Ambulatory Visit: Payer: Self-pay | Admitting: *Deleted

## 2015-12-15 MED ORDER — LEVOTHYROXINE SODIUM 75 MCG PO TABS: 75.0000 ug | ORAL_TABLET | Freq: Every day | ORAL | 1 refills | Status: DC

## 2015-12-15 NOTE — Telephone Encounter (Signed)
Synthroid refilled. Patient should follow up with me in 1 month.  Thanks, Archie Patten, MD [facility] Family Medicine Resident  12/15/2015, 10:24 AM

## 2015-12-18 DIAGNOSIS — F332 Major depressive disorder, recurrent severe without psychotic features: Secondary | ICD-10-CM | POA: Diagnosis not present

## 2015-12-29 ENCOUNTER — Ambulatory Visit (INDEPENDENT_AMBULATORY_CARE_PROVIDER_SITE_OTHER): Payer: Medicare Other | Admitting: Internal Medicine

## 2015-12-29 ENCOUNTER — Encounter: Payer: Self-pay | Admitting: Internal Medicine

## 2015-12-29 VITALS — BP 108/70 | HR 90 | Temp 91.6°F | Ht <= 58 in | Wt 91.6 lb

## 2015-12-29 DIAGNOSIS — R5383 Other fatigue: Secondary | ICD-10-CM | POA: Insufficient documentation

## 2015-12-29 DIAGNOSIS — R630 Anorexia: Secondary | ICD-10-CM

## 2015-12-29 DIAGNOSIS — R5382 Chronic fatigue, unspecified: Secondary | ICD-10-CM | POA: Diagnosis not present

## 2015-12-29 DIAGNOSIS — J449 Chronic obstructive pulmonary disease, unspecified: Secondary | ICD-10-CM

## 2015-12-29 DIAGNOSIS — R110 Nausea: Secondary | ICD-10-CM

## 2015-12-29 DIAGNOSIS — R062 Wheezing: Secondary | ICD-10-CM

## 2015-12-29 MED ORDER — ALBUTEROL SULFATE HFA 108 (90 BASE) MCG/ACT IN AERS: 2.0000 | INHALATION_SPRAY | Freq: Once | RESPIRATORY_TRACT | Status: DC

## 2015-12-29 MED ORDER — FLUTICASONE-SALMETEROL 100-50 MCG/DOSE IN AEPB: 1.0000 | INHALATION_SPRAY | Freq: Every morning | RESPIRATORY_TRACT | 3 refills | Status: DC

## 2015-12-29 MED ORDER — PROMETHAZINE HCL 12.5 MG PO TABS: 12.5000 mg | ORAL_TABLET | Freq: Three times a day (TID) | ORAL | 1 refills | Status: DC | PRN

## 2015-12-29 NOTE — Assessment & Plan Note (Signed)
Possibly normal change with aging. Patient's weight increased 3 pounds from last visit two months ago, and BMI within healthy range. Explained to patient that she is probably not getting the nutrients she needs from one hamburger and one Boost per day. Encouraged patient to begin drinking at least two Boosts per day, and trying to broaden her diet.

## 2015-12-29 NOTE — Progress Notes (Signed)
   Subjective:    Patient ID: [name], female    DOB: 1945-05-22, 70 y.o.   MRN: [id number]  HPI  Patient presents today for weakness, nausea, and difficulty breathing.   Loss of appetite Reports began about 10 months ago. Says she is eating only a [employer] cheeseburger and one Boost every day. Is requesting dronabinol because she said her pharmacist told her that would help her appetite. Reports that she only eats [employer] cheeseburgers because she is too weak to cook for herself and she has no one to cook for her.   Weakness/fatigue Reporting weakness and fatigue for at least a year. Thinks that she is dying. Feels like she is dying every single day because she is so tired. Thinks it may have to do with her thyroid, because she has had thyroid nodules in the past. Sleeps from 3 AM to noon every day. Is unsure why she goes to bed so late. Does not feel rested upon awakening.   Nausea Occurs sporadically, sometimes with eating. Was taking phenergan PRN last year, but has not taken in a while. Is unsure why it was stopped. Is requesting phenergan again. Says that she does not even need an entire pill to achieve relief.   Difficulty breathing Patient reports occasional shortness of breath, especially with exertion. Has previously taken Advair and albuterol, but has not used either of these in a while. Is interested in resuming Advair. Is smoking 5 cigarettes per day, and also reports that people that live near her smoke as well and the smoke wafts into her house.   Review of Systems See HPI.     Objective:   Physical Exam  Constitutional: She is oriented to person, place, and time. No distress.  Disheveled, elderly female  HENT:  Head: Normocephalic and atraumatic.  Neck: Neck supple. No thyromegaly present.  Cardiovascular: Normal rate, regular rhythm and normal heart sounds.   No murmur heard. Pulmonary/Chest: Effort normal. No respiratory distress. She has wheezes. She has  no rales.  Lymphadenopathy:    She has no cervical adenopathy.  Neurological: She is alert and oriented to person, place, and time.  Psychiatric: She has a normal mood and affect. Her behavior is normal.  Vitals reviewed.     Assessment & Plan:  COPD (chronic obstructive pulmonary disease) Appears to have self-discontinued prior medications. Still smoking. Educated patient that her symptoms will continue to worsen if she keeps smoking. Comfortable WOB on RA throughout encounter with O2 sat 96%. - Resume Advair qd - Resume albuterol PRN  Loss of appetite Possibly normal change with aging. Patient's weight increased 3 pounds from last visit two months ago, and BMI within healthy range. Explained to patient that she is probably not getting the nutrients she needs from one hamburger and one Boost per day. Encouraged patient to begin drinking at least two Boosts per day, and trying to broaden her diet.   Fatigue Depression/psychiatric disorders possibly contributing, as well as patient's sleep habits. Labs drawn one week ago normal, including normal TSH and free T4 making hypothyroidism less likely cause for symptoms. Recommended adjusting patient's sleep times so that she is not sleeping for half of the day. Provided tips for changing her sleep patterns and for healthy sleep hygiene.  Nausea Etiology unsure. Will resume phenergan PRN.  - Phenergan 12.5mg  q8 PRN  Adin Hector, MD, MPH PGY-2 [facility] Pager [phone]

## 2015-12-29 NOTE — Patient Instructions (Addendum)
It was nice meeting you today Ms. [name].  Please begin using the Advair inhaler every morning, whether you are having trouble breathing or not. You can then use the albuterol inhaler (1-2 puffs every 4-6 hours) as needed for trouble breathing or wheezing.   You can take phenergan up to three times a day as needed for nausea or vomiting.   Try to increase the amount of Boost you are drinking to at least two per day. Your weight has increased, which is a good thing, however I am concerned you are not getting the right amount of nutrients that your body needs.   You can try the following techniques to improve your sleep: - Begin going to bed earlier, around 10 PM, and waking up earlier, around 8 AM. This may be difficult at first, and you may have to force yourself to get up at 8 AM, however it is important to begin going to bed at the same time and waking up at the same time every single day.  - Do not look at any screens (computer, TV, phone) for at least 30 minutes before bed, and do not look at any screens (including your phone) while in bed. Do not watch TV in bed.  - Sleep in a dark, quiet, cool room.  - Do not read or do any activities other than sleep in your bed.   If you have any questions or concerns, please feel free to call the clinic.   Be well,  Dr. Avon Gully

## 2015-12-29 NOTE — Assessment & Plan Note (Addendum)
Appears to have self-discontinued prior medications. Still smoking. Educated patient that her symptoms will continue to worsen if she keeps smoking. Comfortable WOB on RA throughout encounter with O2 sat 96%. - Resume Advair qd - Resume albuterol PRN

## 2015-12-29 NOTE — Assessment & Plan Note (Signed)
Etiology unsure. Will resume phenergan PRN.  - Phenergan 12.5mg  q8 PRN

## 2015-12-29 NOTE — Assessment & Plan Note (Addendum)
Depression/psychiatric disorders possibly contributing, as well as patient's sleep habits. Labs drawn one week ago normal, including normal TSH and free T4 making hypothyroidism less likely cause for symptoms. Recommended adjusting patient's sleep times so that she is not sleeping for half of the day. Provided tips for changing her sleep patterns and for healthy sleep hygiene.

## 2016-02-09 ENCOUNTER — Other Ambulatory Visit: Payer: Self-pay | Admitting: Family Medicine

## 2016-02-12 DIAGNOSIS — F332 Major depressive disorder, recurrent severe without psychotic features: Secondary | ICD-10-CM | POA: Diagnosis not present

## 2016-02-20 ENCOUNTER — Encounter (HOSPITAL_COMMUNITY): Payer: Self-pay | Admitting: Emergency Medicine

## 2016-02-20 DIAGNOSIS — Z7982 Long term (current) use of aspirin: Secondary | ICD-10-CM | POA: Insufficient documentation

## 2016-02-20 DIAGNOSIS — J449 Chronic obstructive pulmonary disease, unspecified: Secondary | ICD-10-CM | POA: Insufficient documentation

## 2016-02-20 DIAGNOSIS — Z8673 Personal history of transient ischemic attack (TIA), and cerebral infarction without residual deficits: Secondary | ICD-10-CM | POA: Insufficient documentation

## 2016-02-20 DIAGNOSIS — E039 Hypothyroidism, unspecified: Secondary | ICD-10-CM | POA: Insufficient documentation

## 2016-02-20 DIAGNOSIS — E860 Dehydration: Secondary | ICD-10-CM | POA: Insufficient documentation

## 2016-02-20 DIAGNOSIS — R197 Diarrhea, unspecified: Secondary | ICD-10-CM | POA: Diagnosis not present

## 2016-02-20 DIAGNOSIS — R1032 Left lower quadrant pain: Secondary | ICD-10-CM | POA: Diagnosis not present

## 2016-02-20 DIAGNOSIS — F17210 Nicotine dependence, cigarettes, uncomplicated: Secondary | ICD-10-CM | POA: Diagnosis not present

## 2016-02-20 DIAGNOSIS — R1030 Lower abdominal pain, unspecified: Secondary | ICD-10-CM | POA: Diagnosis not present

## 2016-02-20 DIAGNOSIS — I10 Essential (primary) hypertension: Secondary | ICD-10-CM | POA: Insufficient documentation

## 2016-02-20 DIAGNOSIS — K5790 Diverticulosis of intestine, part unspecified, without perforation or abscess without bleeding: Secondary | ICD-10-CM | POA: Diagnosis not present

## 2016-02-20 DIAGNOSIS — R030 Elevated blood-pressure reading, without diagnosis of hypertension: Secondary | ICD-10-CM | POA: Diagnosis not present

## 2016-02-20 NOTE — ED Triage Notes (Signed)
Patient reports generalized body aches with chills , fatigue , emesis and diarrhea onset today , received NS IV 250 ml. By EMS prior to arrival . Alert and oriented/respirations unlabored.

## 2016-02-21 ENCOUNTER — Emergency Department (HOSPITAL_COMMUNITY)
Admission: EM | Admit: 2016-02-21 | Discharge: 2016-02-21 | Disposition: A | Discharge status: Home / Self Care | Payer: Medicare Other | Attending: Emergency Medicine | Admitting: Emergency Medicine

## 2016-02-21 ENCOUNTER — Emergency Department (HOSPITAL_COMMUNITY): Payer: Medicare Other

## 2016-02-21 DIAGNOSIS — K5790 Diverticulosis of intestine, part unspecified, without perforation or abscess without bleeding: Secondary | ICD-10-CM | POA: Diagnosis not present

## 2016-02-21 DIAGNOSIS — R197 Diarrhea, unspecified: Secondary | ICD-10-CM

## 2016-02-21 DIAGNOSIS — R1032 Left lower quadrant pain: Secondary | ICD-10-CM

## 2016-02-21 DIAGNOSIS — E860 Dehydration: Secondary | ICD-10-CM

## 2016-02-21 LAB — URINALYSIS, ROUTINE W REFLEX MICROSCOPIC
Bilirubin Urine: NEGATIVE
Glucose, UA: NEGATIVE mg/dL
Hgb urine dipstick: NEGATIVE
Ketones, ur: 15 mg/dL — AB
Nitrite: NEGATIVE
Protein, ur: NEGATIVE mg/dL
Specific Gravity, Urine: 1.014 (ref 1.005–1.030)
pH: 7 (ref 5.0–8.0)

## 2016-02-21 LAB — COMPREHENSIVE METABOLIC PANEL
ALT: 14 U/L (ref 14–54)
AST: 20 U/L (ref 15–41)
Albumin: 3.8 g/dL (ref 3.5–5.0)
Alkaline Phosphatase: 79 U/L (ref 38–126)
Anion gap: 12 (ref 5–15)
BUN: 17 mg/dL (ref 6–20)
CO2: 21 mmol/L — ABNORMAL LOW (ref 22–32)
Calcium: 10.5 mg/dL — ABNORMAL HIGH (ref 8.9–10.3)
Chloride: 105 mmol/L (ref 101–111)
Creatinine, Ser: 0.78 mg/dL (ref 0.44–1.00)
GFR calc Af Amer: >60 mL/min (ref >60)
GFR calc non Af Amer: >60 mL/min (ref >60)
Glucose, Bld: 112 mg/dL — ABNORMAL HIGH (ref 65–99)
Potassium: 4.2 mmol/L (ref 3.5–5.1)
Sodium: 138 mmol/L (ref 135–145)
Total Bilirubin: 0.8 mg/dL (ref 0.3–1.2)
Total Protein: 5.8 g/dL — ABNORMAL LOW (ref 6.5–8.1)

## 2016-02-21 LAB — URINE MICROSCOPIC-ADD ON

## 2016-02-21 LAB — CBC WITH DIFFERENTIAL/PLATELET
Basophils Absolute: 0 10*3/uL (ref 0.0–0.1)
Basophils Relative: 0 %
Eosinophils Absolute: 0 10*3/uL (ref 0.0–0.7)
Eosinophils Relative: 0 %
HCT: 41.9 % (ref 36.0–46.0)
Hemoglobin: 14.1 g/dL (ref 12.0–15.0)
Lymphocytes Relative: 5 %
Lymphs Abs: 0.6 10*3/uL — ABNORMAL LOW (ref 0.7–4.0)
MCH: 32.3 pg (ref 26.0–34.0)
MCHC: 33.7 g/dL (ref 30.0–36.0)
MCV: 95.9 fL (ref 78.0–100.0)
Monocytes Absolute: 0.7 10*3/uL (ref 0.1–1.0)
Monocytes Relative: 6 %
Neutro Abs: 10.6 10*3/uL — ABNORMAL HIGH (ref 1.7–7.7)
Neutrophils Relative %: 89 %
Platelets: 197 10*3/uL (ref 150–400)
RBC: 4.37 MIL/uL (ref 3.87–5.11)
RDW: 13.2 % (ref 11.5–15.5)
WBC: 11.9 10*3/uL — ABNORMAL HIGH (ref 4.0–10.5)

## 2016-02-21 LAB — LIPASE, BLOOD: Lipase: 25 U/L (ref 11–51)

## 2016-02-21 MED ORDER — FENTANYL CITRATE (PF) 100 MCG/2ML IJ SOLN
50.0000 ug | Freq: Once | INTRAMUSCULAR | Status: DC
  Filled 2016-02-21: qty 2

## 2016-02-21 MED ORDER — IOPAMIDOL (ISOVUE-300) INJECTION 61%
INTRAVENOUS | Status: AC
  Filled 2016-02-21: qty 30

## 2016-02-21 MED ORDER — SODIUM CHLORIDE 0.9 % IV BOLUS (SEPSIS)
1000.0000 mL | Freq: Once | INTRAVENOUS | Status: AC
  Administered 2016-02-21: 1000 mL via INTRAVENOUS

## 2016-02-21 MED ORDER — ONDANSETRON HCL 4 MG/2ML IJ SOLN
4.0000 mg | Freq: Once | INTRAMUSCULAR | Status: AC
  Administered 2016-02-21: 4 mg via INTRAVENOUS
  Filled 2016-02-21: qty 2

## 2016-02-21 MED ORDER — IOPAMIDOL (ISOVUE-300) INJECTION 61%
INTRAVENOUS | Status: AC
Start: 2016-02-21 — End: 2016-02-21
  Administered 2016-02-21: 100 mL
  Filled 2016-02-21: qty 100

## 2016-02-21 NOTE — ED Notes (Signed)
Dr. Wickline at bedside at this time.  

## 2016-02-21 NOTE — ED Notes (Signed)
Pt unable to sign electronic signature pad. Pad was not working at the time of discharge. Pt understands DC instructions

## 2016-02-21 NOTE — Discharge Instructions (Signed)

## 2016-02-21 NOTE — ED Provider Notes (Signed)
[facility] DEPT Provider Note   CSN: [id number] Arrival date & time: 02/20/16  2327   By signing my name below, I, Delton Prairie, attest that this documentation has been prepared under the direction and in the presence of Ripley Fraise, MD  Electronically Signed: Delton Prairie, ED Scribe. 02/21/16. 12:54 AM.   History   Chief Complaint Chief Complaint  Patient presents with  . Generalized Body Aches  . Emesis  . Diarrhea  . Fatigue   The history is provided by the patient. No language interpreter was used.  Diarrhea   This is a new problem. The current episode started yesterday. Associated symptoms include abdominal pain. Pertinent negatives include no vomiting, no headaches and no cough.  Abdominal Pain   This is a new problem. The current episode started yesterday. The problem occurs constantly. The problem has not changed since onset.The pain is associated with an unknown factor. The pain is located in the LLQ. The pain is moderate. Associated symptoms include fever, diarrhea and nausea. Pertinent negatives include vomiting and headaches. The symptoms are aggravated by palpation and activity. Relieved by: rest. Her past medical history is significant for GERD.   HPI Comments:  [name] is a 70 y.o. female who presents to the Emergency Department complaining of sudden onset, moderate abdominal pain which began in the AM yesterday. Her abdominal pain is worse when standing, during ambulation and upon palpation. Her pain is better with rest. Pt notes associated diarrhea, nausea, and fevers. She denies hematochezia, vomiting, cough, headaches, chest pain, recent antibiotics use and recent long distance travel. Pt has taken [employer] yesterday (last dose at 2 PM) to help her appetite with no relief.   Past Medical History:  Diagnosis Date  . Anxiety   . COPD (chronic obstructive pulmonary disease) (HCC)    Due to long history of tobacco abuse, still smoking  .  Depression   . Diverticulosis of colon   . GERD (gastroesophageal reflux disease)   . Heart murmur   . History of uterine prolapse 10/2004   Dr Cletis Media  . Hypertension   . OCD (obsessive compulsive disorder)   . Osteoporosis   . PUD (peptic ulcer disease) 09/2006   H pylori  . Thyroid disease    Ho R thyroid noduel plus mild hyperthyroidism. Treated with radioiodine therapty on 04/2006. Dr Estrella Myrtle.    Patient Active Problem List   Diagnosis Date Noted  . Loss of appetite 12/29/2015  . Fatigue 12/29/2015  . Bipolar affective disorder, currently depressed, moderate ([place]) 11/26/2015  . Severe episode of recurrent major depressive disorder, without psychotic features ([place])   . Foreign body in foot, right 01/09/2015  . Blister of right ankle without infection 01/09/2015  . Sinusitis 11/26/2014  . Rotator cuff syndrome of right shoulder 11/14/2014  . Varicose veins of both lower extremities with pain 10/29/2014  . Neck pain 09/26/2014  . Metatarsal fracture 08/16/2014  . Pain in joint, shoulder region 08/16/2014  . UTI (urinary tract infection) 08/16/2014  . Facial skin lesion 08/01/2014  . Leg edema, left 07/25/2014  . Edema of left lower extremity 07/22/2014  . Essential hypertension   . Thyroid activity decreased   . HLD (hyperlipidemia)   . Healthcare maintenance 05/31/2014  . Emotional neurotic disorder 05/07/2014  . Seborrheic keratosis 12/05/2013  . 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 At high risk for falls 06/28/2012  . Allergic rhinitis 06/16/2012  . Hearing loss 01/07/2011  . Foot pain 11/25/2010  .  Thyroid nodule 10/16/2010  . Nausea 09/22/2010  . URINARY INCONTINENCE, STRESS, FEMALE 04/03/2010  . HYPERLIPIDEMIA 03/25/2010  . PULMONARY NODULE 12/23/2009  . COPD (chronic obstructive pulmonary disease) ([place]) 12/03/2009  . Tobacco abuse counseling 11/05/2009  . CVA 11/05/2009  . BIPOLAR DISORDER UNSPECIFIED 11/28/2008  . HYPOTHYROIDISM 05/06/2008  . OCD  (obsessive compulsive disorder) 10/13/2006  . GERD 10/13/2006  . OSTEOPOROSIS 10/13/2006    Past Surgical History:  Procedure Laterality Date  . ABDOMINAL HYSTERECTOMY    . BIOPSY THYROID  08/2008   Atypia and Hurtle cells, partial thyroidectomy   . BLADDER SUSPENSION     mesh sling  . Carotid ultrasound  09/10/09   No significant extracranial carotid artery stenosis, vertebrals are patent with antegrade flow.   . COLONOSCOPY  06/03/2005   Hyperplastic polyps, sigmoid diverticulosis, internal hemorroids  . FOREIGN BODY REMOVAL Right 02/21/2015   Procedure: RIGHT FOOT FOREIGN BODY REMOVAL ;  Surgeon: Marchia Bond, MD;  Location: [facility];  Service: Orthopedics;  Laterality: Right;  . Ganglion removal  12/2005   R wrist subretinacular dorsal ganglion  . MRI Head  09/20/09   No acute intracranial abn.  Signal abnormality suggestive of chronic small vessel ischemia, maximal in the right subinsular white and deep gray mater.   [name] PFT  11/2006   Obstructive pattern.  Poor response to bronchodilators.   . THYROIDECTOMY, PARTIAL  08/2008   Dr Ronnald Collum  . TONSILLECTOMY AND ADENOIDECTOMY  10/2004  . TRANSTHORACIC ECHOCARDIOGRAM  123456   Normal systolic function.  EF 55-60%.  Mild MR, atria ok, trivial pericardial effusion  . TVT  06/15/10   Tension-free Vaginal Tape, Dr Mancel Bale, for urge incontinence    OB History    Gravida Para Term Preterm AB Living   2         2   SAB TAB Ectopic Multiple Live Births                   Home Medications    Prior to Admission medications   Medication Sig Start Date End Date Taking? Authorizing Provider  aspirin EC 325 MG tablet Take 325 mg by mouth daily.    Historical Provider, MD  cephALEXin (KEFLEX) 500 MG capsule Take 1 capsule (500 mg total) by mouth 3 (three) times daily. Patient not taking: Reported on 11/25/2015 10/08/15   Leo Grosser, MD  Fluticasone-Salmeterol (ADVAIR DISKUS) 100-50 MCG/DOSE AEPB Inhale 1 puff into the  lungs every morning. 12/29/15   Verner Mould, MD  levothyroxine (SYNTHROID, LEVOTHROID) 75 MCG tablet TAKE 1 TABLET (75 MCG TOTAL) BY MOUTH DAILY BEFORE BREAKFAST. 02/10/16   Archie Patten, MD  mirtazapine (REMERON) 15 MG tablet Take 1 tablet (15 mg total) by mouth at bedtime. Patient not taking: Reported on 11/25/2015 09/21/15   Carmin Muskrat, MD  OLANZapine (ZYPREXA) 5 MG tablet Take 2.5 mg by mouth at bedtime.    Historical Provider, MD  polyethylene glycol powder (GLYCOLAX/MIRALAX) powder Take 17 g by mouth 2 (two) times daily as needed for mild constipation. Patient not taking: Reported on 11/25/2015 10/13/15   Archie Patten, MD  promethazine (PHENERGAN) 12.5 MG tablet Take 1 tablet (12.5 mg total) by mouth every 8 (eight) hours as needed for nausea or vomiting. 12/29/15   Verner Mould, MD  vortioxetine HBr (TRINTELLIX) 10 MG TABS Take 10 mg by mouth daily.    Historical Provider, MD    Family History Family History  Problem Relation Age  of Onset  . Heart disease Mother   . Cirrhosis Sister   . Hypertension Sister   . Heart disease Maternal Grandmother   . Heart disease Maternal Grandfather   . Other Neg Hx     Social History Social History  Substance Use Topics  . Smoking status: Current Every Day Smoker    Packs/day: 0.25    Years: 35.00    Types: Cigarettes  . Smokeless tobacco: Never Used     Comment: smokes less than 1 pack per week. ready to quitt.  . Alcohol use No     Allergies   Seroquel [quetiapine fumarate]; Amoxicillin; Ciprofloxacin; Haloperidol lactate; Ketorolac tromethamine; Latuda [lurasidone hcl]; Thorazine [chlorpromazine]; and Wellbutrin [bupropion]   Review of Systems Review of Systems  Constitutional: Positive for fever.  Respiratory: Negative for cough.   Cardiovascular: Negative for chest pain.  Gastrointestinal: Positive for abdominal pain, diarrhea and nausea. Negative for vomiting.  Neurological: Negative for  headaches.  All other systems reviewed and are negative.    Physical Exam Updated Vital Signs BP 141/78 (BP Location: Right Arm)   Pulse 120   Temp 98.4 F (36.9 C)   Resp 16   Ht 4\' 6"  (1.372 m)   Wt 95 lb 9.6 oz (43.4 kg)   LMP 01/09/2012   SpO2 99%   BMI 23.05 kg/m   Physical Exam CONSTITUTIONAL: Elderly, frail HEAD: Normocephalic/atraumatic EYES: EOMI/PERRL ENMT: Mucous membranes dry NECK: supple no meningeal signs SPINE/BACK:entire spine nontender CV: S1/S2 noted, no murmurs/rubs/gallops noted LUNGS: Lungs are clear to auscultation bilaterally, no apparent distress ABDOMEN: soft, moderate LLQ tenderness, no rebound or guarding, bowel sounds noted throughout abdomen NEURO: Pt is awake/alert/appropriate, moves all extremitiesx4.  No facial droop.   EXTREMITIES: pulses normal/equal, full ROM SKIN: warm, color normal PSYCH: no abnormalities of mood noted, alert and oriented to situation   ED Treatments / Results  DIAGNOSTIC STUDIES:  Oxygen Saturation is 99% on RA, normal by my interpretation.    COORDINATION OF CARE:  12:52 AM Discussed treatment plan with pt at bedside and pt agreed to plan.  Labs (all labs ordered are listed, but only abnormal results are displayed) Labs Reviewed  CBC WITH DIFFERENTIAL/PLATELET - Abnormal; Notable for the following:       Result Value   WBC 11.9 (*)    Neutro Abs 10.6 (*)    Lymphs Abs 0.6 (*)    All other components within normal limits  COMPREHENSIVE METABOLIC PANEL - Abnormal; Notable for the following:    CO2 21 (*)    Glucose, Bld 112 (*)    Calcium 10.5 (*)    Total Protein 5.8 (*)    All other components within normal limits  URINALYSIS, ROUTINE W REFLEX MICROSCOPIC (NOT AT [facility]) - Abnormal; Notable for the following:    APPearance TURBID (*)    Ketones, ur 15 (*)    Leukocytes, UA SMALL (*)    All other components within normal limits  URINE MICROSCOPIC-ADD ON - Abnormal; Notable for the following:     Squamous Epithelial / LPF 0-5 (*)    Bacteria, UA FEW (*)    Casts GRANULAR CAST (*)    All other components within normal limits  LIPASE, BLOOD    EKG  EKG Interpretation  Date/Time:  Saturday February 21 2016 01:13:47 EST Ventricular Rate:  116 PR Interval:    QRS Duration: 94 QT Interval:  343 QTC Calculation: 477 R Axis:   100 Text Interpretation:  Sinus tachycardia Biatrial  enlargement Anteroseptal infarct, age indeterminate Baseline wander in lead(s) V4 No significant change since last tracing Confirmed by [facility]  MD, Dickens ([id number]) on 02/21/2016 1:28:34 AM       Radiology Ct Abdomen Pelvis W Contrast  Result Date: 02/21/2016 CLINICAL DATA:  Left lower quadrant abdominal pain. EXAM: CT ABDOMEN AND PELVIS WITH CONTRAST TECHNIQUE: Multidetector CT imaging of the abdomen and pelvis was performed using the standard protocol following bolus administration of intravenous contrast. CONTRAST:  154mL ISOVUE-300 IOPAMIDOL (ISOVUE-300) INJECTION 61% COMPARISON:  None. FINDINGS: Lower chest: No acute abnormality. Hepatobiliary: There are several sub cm low-attenuation liver lesions, more likely benign cysts. There is mild hepatic steatosis. No suspicious focal liver lesion. Gallbladder and bile ducts are unremarkable. Pancreas: Unremarkable. No pancreatic ductal dilatation or surrounding inflammatory changes. Spleen: Normal in size without focal abnormality. Adrenals/Urinary Tract: Multiple sub cm renal lesions, more likely cysts. No suspicious renal parenchymal lesions. Collecting systems and ureters are unremarkable. Urinary bladder is distended but otherwise unremarkable. Stomach/Bowel: Stomach, small bowel and appendix are normal. There is extensive diverticulosis of the left hemicolon. No conclusive CT evidence of diverticulitis. No abscess. No extraluminal air. No bowel obstruction. Vascular/Lymphatic: The abdominal aorta is normal in caliber with extensive atherosclerotic calcification.  No pathologic adenopathy is evident in the abdomen or pelvis. Reproductive: Hysterectomy. No adnexal masses. Pelvic floor laxity, with prolapse of the rectum and bladder base. Other: Umbilical hernia containing a a knuckle of unobstructed mid transverse colon. No ascites. Musculoskeletal: No acute or significant osseous findings. IMPRESSION: 1. Colonic diverticulosis. No conclusive diverticulitis. No abscess. No perforation. No bowel obstruction. 2. Pelvic floor laxity with prolapse of the rectum and bladder base. Moderate urinary bladder distention. 3. Nonobstructing umbilical hernia containing a knuckle of mid transverse colon. 4. Multiple sub cm low-attenuation lesions of the liver and kidneys, most likely benign although not conclusively characterized. 5. Mild hepatic steatosis Electronically Signed   By: Andreas Newport M.D.   On: 02/21/2016 03:37    Procedures Procedures   Medications Ordered in ED Medications  fentaNYL (SUBLIMAZE) injection 50 mcg (50 mcg Intravenous Not Given 02/21/16 0133)  iopamidol (ISOVUE-300) 61 % injection (not administered)  sodium chloride 0.9 % bolus 1,000 mL (1,000 mLs Intravenous New Bag/Given 02/21/16 0128)  ondansetron (ZOFRAN) injection 4 mg (4 mg Intravenous Given 02/21/16 0134)  iopamidol (ISOVUE-300) 61 % injection (100 mLs  Contrast Given 02/21/16 0303)     Initial Impression / Assessment and Plan / ED Course  I have reviewed the triage vital signs and the nursing notes.  Pertinent labs results that were available during my care of the patient were reviewed by me and considered in my medical decision making (see chart for details).  Clinical Course     Pt here for abd pain and diarrhea She appears dehydrated She was given IV fluids CT scan performed due to LLQ tenderness = no acute findings on CT scan She had mild bladder distention and prolapse but she states this is all chronic and unchanged She is requesting ginger ale and discharge  home She is still mildly tachycardic, but not septic appearing and overall improved  We discussed strict return precautions Advised to avoid home remedies ("charlotte's web") as it is unclear what is in this concoction and this may have contributed to her symptoms  Final Clinical Impressions(s) / ED Diagnoses   Final diagnoses:  Left lower quadrant pain  Dehydration  Diarrhea, unspecified type    New Prescriptions New Prescriptions   No medications on  file  I personally performed the services described in this documentation, which was scribed in my presence. The recorded information has been reviewed and is accurate.        Ripley Fraise, MD 02/21/16 [phone]

## 2016-02-21 NOTE — ED Notes (Signed)
Pt verbalized understanding of d/c instructions and has no further questions. Pt is stable, A&Ox4, VSS.  

## 2016-02-21 NOTE — ED Notes (Signed)
Patient transported to CT 

## 2016-02-29 IMAGING — CR DG CHEST 2V
2 series · 2 of 2 positions shown · non-contrast
Comparison: 04/19/2015 and prior radiographs is

CLINICAL DATA: 69-year-old female with acute shortness of breath
and chest pain today.

EXAM:
CHEST  2 VIEW

[w chest lat]
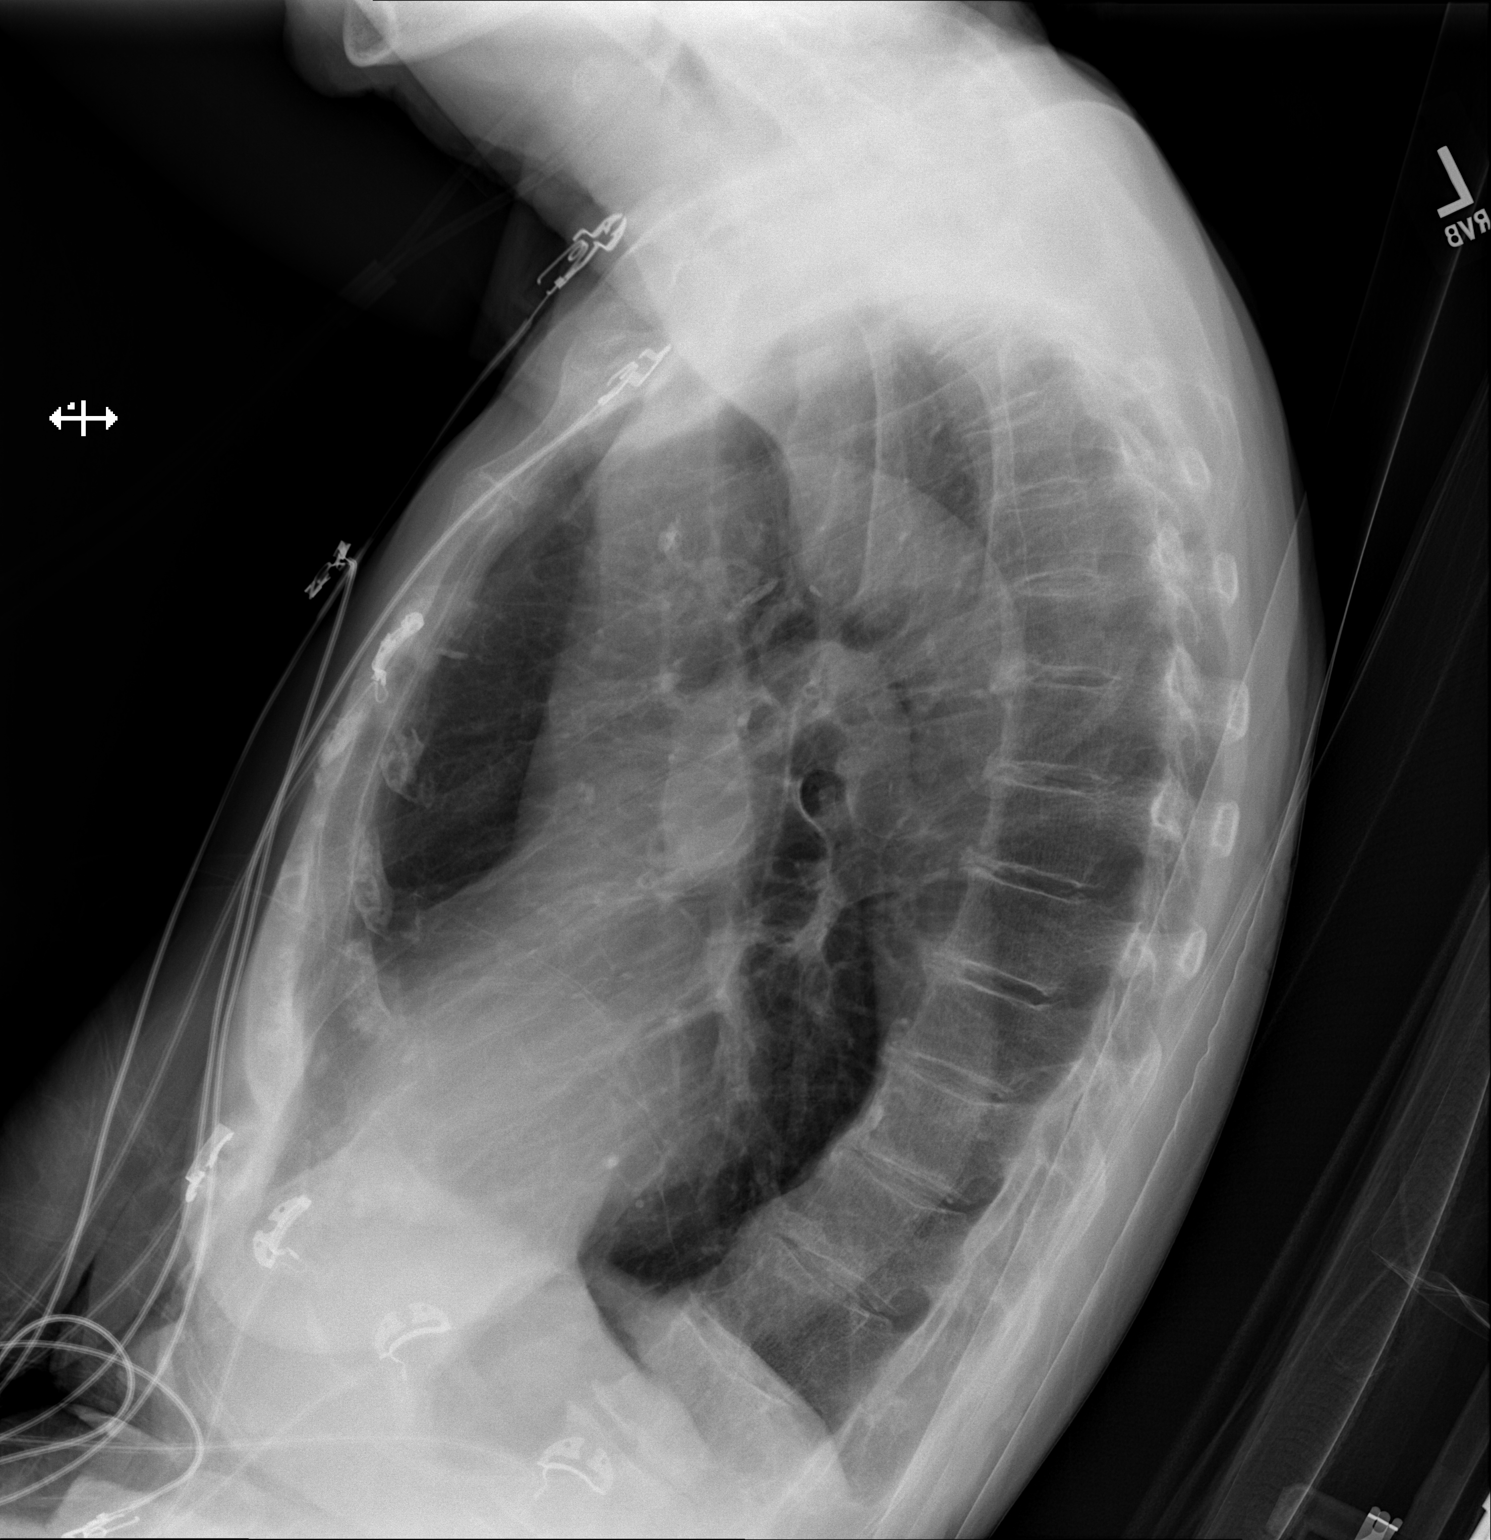

[x chest ap]
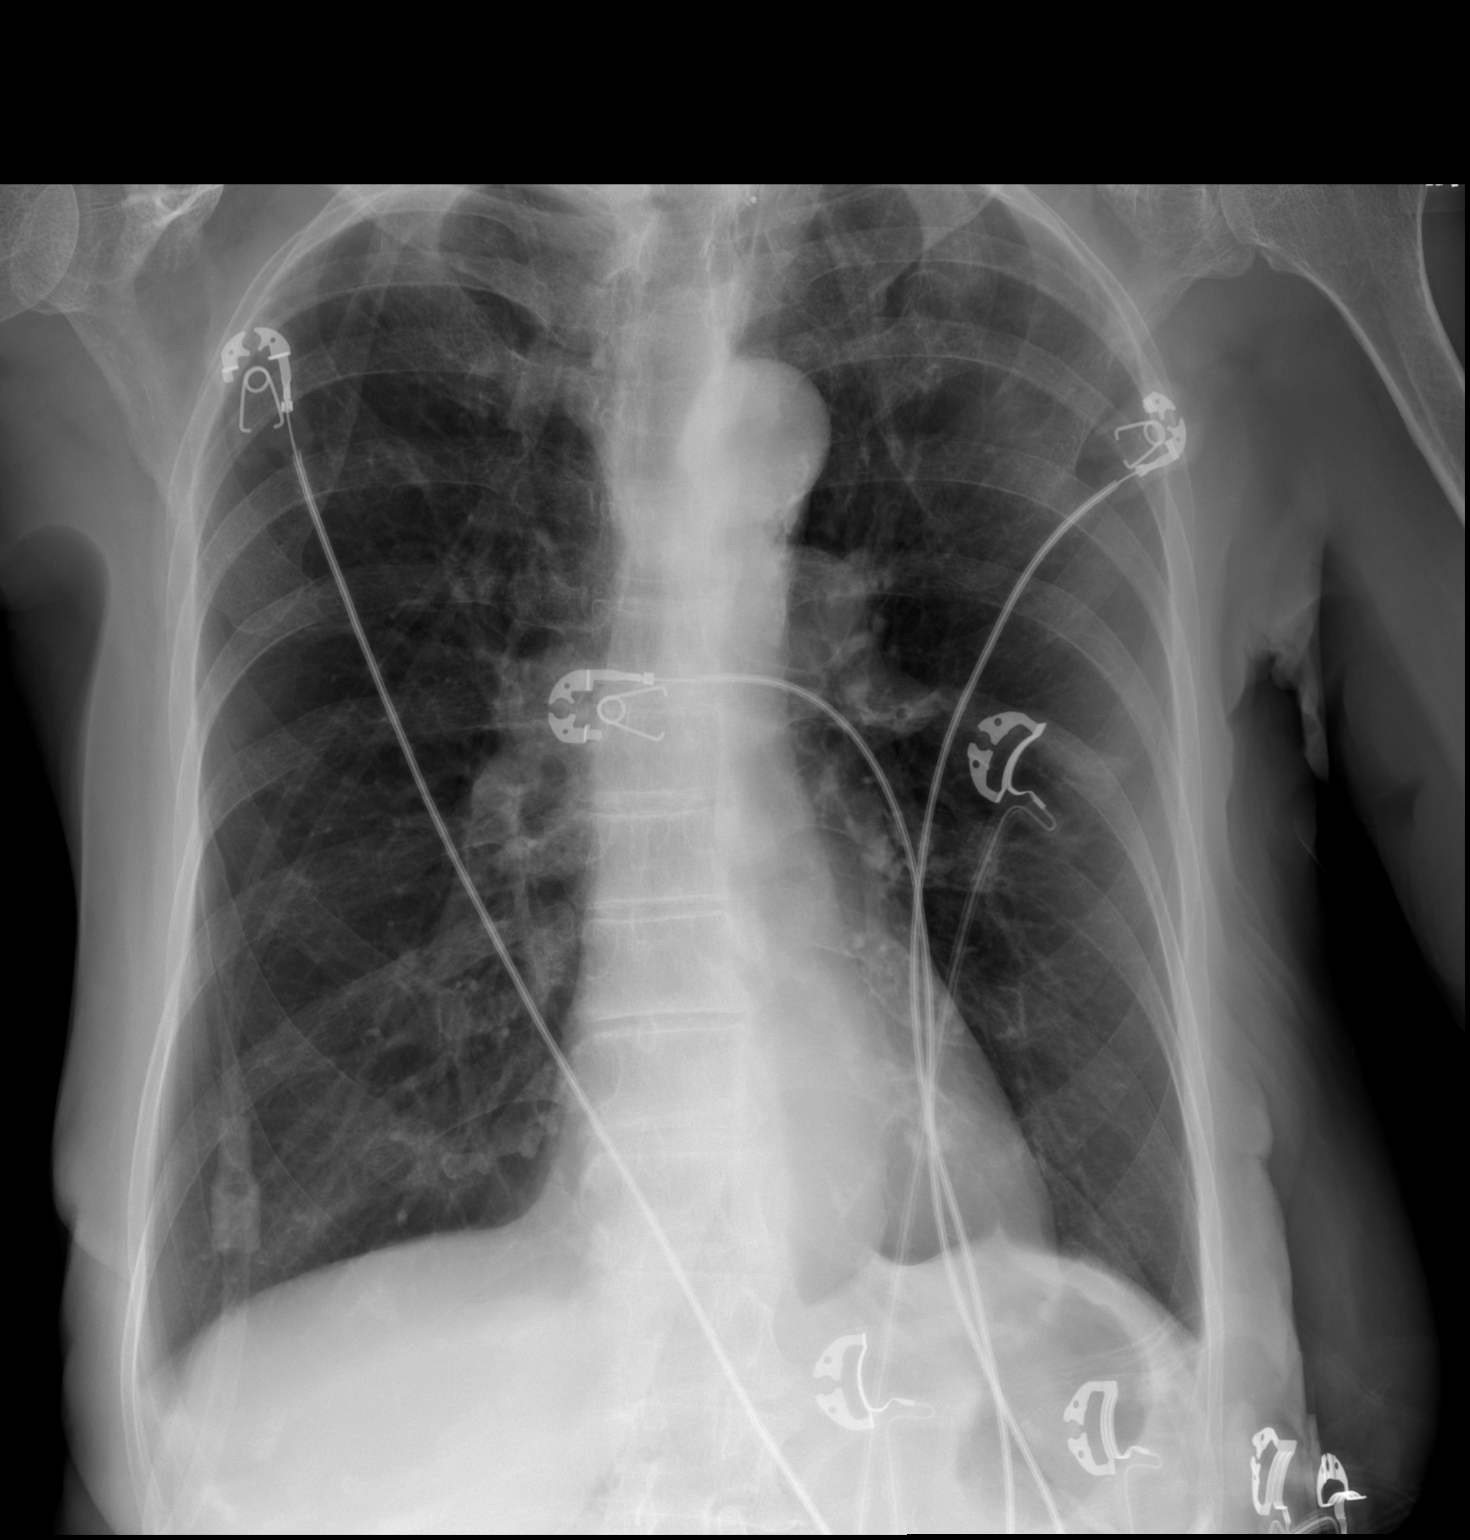

[2 of 2 positions shown; findings below may reference images not displayed]

FINDINGS: The cardiomediastinal silhouette is unremarkable.

COPD/ emphysema identified.

There is no evidence of focal airspace disease, pulmonary edema,
suspicious pulmonary nodule/mass, pleural effusion, or pneumothorax.
No acute bony abnormalities are identified.
IMPRESSION: COPD/emphysema without evidence of acute cardiopulmonary disease.

## 2016-04-11 ENCOUNTER — Other Ambulatory Visit: Payer: Self-pay | Admitting: Family Medicine

## 2016-05-13 DIAGNOSIS — F332 Major depressive disorder, recurrent severe without psychotic features: Secondary | ICD-10-CM | POA: Diagnosis not present

## 2016-06-02 ENCOUNTER — Other Ambulatory Visit: Payer: Self-pay | Admitting: Family Medicine

## 2016-06-02 NOTE — Telephone Encounter (Signed)
LMOVM for pt to call us back. Deseree Blount, CMA  

## 2016-06-02 NOTE — Telephone Encounter (Signed)
Refilled Synthroid, please have pt f/u in our clinic so we can follow up on her thyroid function within the next 1-2 months.  Thanks, Archie Patten, MD [facility] Family Medicine Resident  06/02/2016, 2:06 PM

## 2016-06-10 ENCOUNTER — Ambulatory Visit: Payer: Self-pay | Admitting: Family Medicine

## 2016-07-14 DIAGNOSIS — F319 Bipolar disorder, unspecified: Secondary | ICD-10-CM | POA: Diagnosis not present

## 2016-07-14 DIAGNOSIS — F23 Brief psychotic disorder: Secondary | ICD-10-CM | POA: Diagnosis not present

## 2016-08-01 ENCOUNTER — Other Ambulatory Visit: Payer: Self-pay | Admitting: Family Medicine

## 2016-08-06 ENCOUNTER — Ambulatory Visit (INDEPENDENT_AMBULATORY_CARE_PROVIDER_SITE_OTHER): Payer: Medicare Other | Admitting: Family Medicine

## 2016-08-06 ENCOUNTER — Encounter: Payer: Self-pay | Admitting: Family Medicine

## 2016-08-06 VITALS — BP 116/80 | HR 115 | Temp 98.7°F | Wt 93.0 lb

## 2016-08-06 DIAGNOSIS — R5382 Chronic fatigue, unspecified: Secondary | ICD-10-CM | POA: Diagnosis not present

## 2016-08-06 DIAGNOSIS — Z114 Encounter for screening for human immunodeficiency virus [HIV]: Secondary | ICD-10-CM

## 2016-08-06 DIAGNOSIS — R5383 Other fatigue: Secondary | ICD-10-CM | POA: Diagnosis not present

## 2016-08-06 NOTE — Progress Notes (Signed)
Subjective:   [name] is a 71 y.o. female with a history of HTN, CVA, COPD, hypothyroidism, bipolar disorder, OCD, tobacco abuse here for same day appt for No chief complaint on file.   Fatigue - tired and feel bad every day - decrease function - cant drive anymore - stays in apartment all the time - decreased appetite x1 yr - peanut butter and 1 ensure per day - feels like walking through mud - feeling this way for months and months - thinks it is mostly related to depression - wants "blood work" done - has constant negative thoughts - + passive SI - but too scared to do anything - last seen by [facility] 2 months ago - only taking lexapro - doesn't think it is working - previously on Zyprexa, Remeron, Trintellix  Review of Systems:  Per HPI.   Social History: current smoker  Objective:  BP 116/80   Pulse (!) 115   Temp 98.7 F (37.1 C) (Oral)   Wt 93 lb (42.2 kg)   LMP 01/09/2012   BMI 22.42 kg/m   Gen:  71 y.o. female in NAD  HEENT: NCAT, MMM, EOMI, PERRL, anicteric sclerae CV: RRR, no MRG Resp: Non-labored, CTAB, no wheezes noted Abd: Soft, NTND, BS present, no guarding or organomegaly Ext: WWP, no edema MSK: pronounced kyphosis, gait intact Neuro: Alert and oriented, speech normal, strength intact       Chemistry      Component Value Date/Time   NA 138 02/20/2016 2340   K 4.2 02/20/2016 2340   CL 105 02/20/2016 2340   CO2 21 (L) 02/20/2016 2340   BUN 17 02/20/2016 2340   CREATININE 0.78 02/20/2016 2340   CREATININE 0.75 05/17/2013 1701      Component Value Date/Time   CALCIUM 10.5 (H) 02/20/2016 2340   ALKPHOS 79 02/20/2016 2340   AST 20 02/20/2016 2340   ALT 14 02/20/2016 2340   BILITOT 0.8 02/20/2016 2340      Lab Results  Component Value Date   WBC 11.9 (H) 02/20/2016   HGB 14.1 02/20/2016   HCT 41.9 02/20/2016   MCV 95.9 02/20/2016   PLT 197 02/20/2016   Lab Results  Component Value Date   TSH 1.269 11/25/2015   Lab Results    Component Value Date   HGBA1C  10/19/2009    5.4 (NOTE)                                                                       According to the ADA Clinical Practice Recommendations for 2011, when HbA1c is used as a screening test:   >=6.5%   Diagnostic of Diabetes Mellitus           (if abnormal result  is confirmed)  5.7-6.4%   Increased risk of developing Diabetes Mellitus  References:Diagnosis and Classification of Diabetes Mellitus,Diabetes FGHW,2993,71(IRCVE 1):S62-S69 and Standards of Medical Care in         Diabetes - 2011,Diabetes Care,2011,34  (Suppl 1):S11-S61.   Assessment & Plan:     [name] is a 71 y.o. female here for   Fatigue Likely more of a chronic issue, not acute Depression/psychiatric disorders likely contributing We will check for metabolic causes including  CMP, CBC, TSH Also screening for HIV Offered behavioral health consult in clinic today, patient refuses Advised patient that she needs to follow-up with her psychiatrist as soon as possible Contracted for safety if her SI becomes active rather than metastasis   Virginia Crews, MD MPH PGY-3,  [facility] 08/06/2016  9:43 AM

## 2016-08-06 NOTE — Patient Instructions (Signed)
Nice to meet you today. We are getting some blood work and someone will call you or send you a letter with the results when they're available. Is importantly follow-up with your psychiatrist. If you have any thoughts of harming yourself and develop a plan is important to seek medical care straight away. You can call 911 or go to the emergency department.  Take care, Dr. Jacinto Reap

## 2016-08-06 NOTE — Assessment & Plan Note (Signed)
Likely more of a chronic issue, not acute Depression/psychiatric disorders likely contributing We will check for metabolic causes including CMP, CBC, TSH Also screening for HIV Offered behavioral health consult in clinic today, patient refuses Advised patient that she needs to follow-up with her psychiatrist as soon as possible Contracted for safety if her SI becomes active rather than metastasis

## 2016-08-07 LAB — CMP14+EGFR
ALT: 7 IU/L (ref 0–32)
AST: 15 IU/L (ref 0–40)
Albumin/Globulin Ratio: 2.1 (ref 1.2–2.2)
Albumin: 4 g/dL (ref 3.5–4.8)
Alkaline Phosphatase: 109 IU/L (ref 39–117)
BUN/Creatinine Ratio: 11 — ABNORMAL LOW (ref 12–28)
BUN: 10 mg/dL (ref 8–27)
Bilirubin Total: 0.3 mg/dL (ref 0.0–1.2)
CO2: 20 mmol/L (ref 18–29)
Calcium: 9.5 mg/dL (ref 8.7–10.3)
Chloride: 98 mmol/L (ref 96–106)
Creatinine, Ser: 0.88 mg/dL (ref 0.57–1.00)
GFR calc Af Amer: 76 mL/min/{1.73_m2} (ref >59)
GFR calc non Af Amer: 66 mL/min/{1.73_m2} (ref >59)
Globulin, Total: 1.9 g/dL (ref 1.5–4.5)
Glucose: 92 mg/dL (ref 65–99)
Potassium: 4.3 mmol/L (ref 3.5–5.2)
Sodium: 137 mmol/L (ref 134–144)
Total Protein: 5.9 g/dL — ABNORMAL LOW (ref 6.0–8.5)

## 2016-08-07 LAB — CBC
Hematocrit: 41.2 % (ref 34.0–46.6)
Hemoglobin: 14.3 g/dL (ref 11.1–15.9)
MCH: 32.3 pg (ref 26.6–33.0)
MCHC: 34.7 g/dL (ref 31.5–35.7)
MCV: 93 fL (ref 79–97)
Platelets: 306 10*3/uL (ref 150–379)
RBC: 4.43 x10E6/uL (ref 3.77–5.28)
RDW: 13.6 % (ref 12.3–15.4)
WBC: 5.9 10*3/uL (ref 3.4–10.8)

## 2016-08-07 LAB — TSH: TSH: 5.2 u[IU]/mL — ABNORMAL HIGH (ref 0.450–4.500)

## 2016-08-07 LAB — HIV ANTIBODY (ROUTINE TESTING W REFLEX): HIV Screen 4th Generation wRfx: NONREACTIVE

## 2016-08-09 ENCOUNTER — Telehealth: Payer: Self-pay | Admitting: Family Medicine

## 2016-08-09 MED ORDER — LEVOTHYROXINE SODIUM 88 MCG PO TABS: 88.0000 ug | ORAL_TABLET | Freq: Every day | ORAL | 1 refills | Status: DC

## 2016-08-09 NOTE — Telephone Encounter (Signed)
Called patient to discuss lab results.  CBC, CMP wnl. HIV neg. TSH elevated slightly.  Patient denies any missed doses of Synthroid.  Will increase from 55mcg to 88 mcg daily.  Advised f/u for other concerns.  Will need TSH recheck in 6 weeks.  Virginia Crews, MD, MPH PGY-3,  [facility] 08/09/2016 1:47 PM

## 2016-08-09 NOTE — Telephone Encounter (Signed)
Pt wants to know her lab results from Friday.

## 2016-08-09 NOTE — Telephone Encounter (Signed)
See other phone note. Already completed.  Virginia Crews, MD, MPH PGY-3,  [facility] 08/09/2016 2:09 PM

## 2016-08-13 ENCOUNTER — Telehealth: Payer: Self-pay | Admitting: Psychology

## 2016-08-13 NOTE — Telephone Encounter (Signed)
[name] left two VMs while I was out of the office last week.  I returned her phone call today and left a VM.  Notably, I have had contact with her most frequent in May of 2015, 2017, and now 2018 with little other contact.  There may be a seasonal component to her mental health issues or perhaps there is an anniversary that makes this an especially difficult time for her.  Chart review (done last May and reviewed again today) indicated she is not a good candidate for primary care psychiatry or Mood Clinic.  An 11/27/2015 ED visit seemed to head toward a [facility] hospitalization.  [facility] recommended a Geriatric Psych placement.  No admission noted after that.    Will discuss with Ms. Stonerock and also touch base with Dr. Lorenso Courier.

## 2016-08-19 ENCOUNTER — Other Ambulatory Visit: Payer: Self-pay | Admitting: Family Medicine

## 2016-09-07 ENCOUNTER — Ambulatory Visit (HOSPITAL_COMMUNITY)
Admission: EM | Admit: 2016-09-07 | Discharge: 2016-09-07 | Disposition: A | Discharge status: Home / Self Care | Payer: Medicare Other | Attending: Internal Medicine | Admitting: Internal Medicine

## 2016-09-07 ENCOUNTER — Encounter (HOSPITAL_COMMUNITY): Payer: Self-pay | Admitting: Emergency Medicine

## 2016-09-07 DIAGNOSIS — B351 Tinea unguium: Secondary | ICD-10-CM | POA: Diagnosis not present

## 2016-09-07 DIAGNOSIS — L84 Corns and callosities: Secondary | ICD-10-CM

## 2016-09-07 DIAGNOSIS — M79671 Pain in right foot: Secondary | ICD-10-CM | POA: Diagnosis not present

## 2016-09-07 MED ORDER — PROMETHAZINE HCL 25 MG PO TABS: ORAL_TABLET | ORAL | 0 refills | Status: DC

## 2016-09-07 NOTE — ED Provider Notes (Signed)
CSN: [id number]     Arrival date & time 09/07/16  1218 History   None    Chief Complaint  Patient presents with  . Foot Pain   (Consider location/radiation/quality/duration/timing/severity/associated sxs/prior Treatment) 71 year old female presents to the urgent care with primary complaints of her right foot. She has discovered to the areas of thickening to the plantar aspect of the right heel. No associated pain except when loosing weight on her heel. She is also concerned about a thickened callus to the base of the fifth toe to the lateral and partially plantar aspect. And she is wanting to have her nails clipped. She states they are to thick for her to cut.      Past Medical History:  Diagnosis Date  . Anxiety   . COPD (chronic obstructive pulmonary disease) (HCC)    Due to long history of tobacco abuse, still smoking  . Depression   . Diverticulosis of colon   . GERD (gastroesophageal reflux disease)   . Heart murmur   . History of uterine prolapse 10/2004   Dr Cletis Media  . Hypertension   . OCD (obsessive compulsive disorder)   . Osteoporosis   . PUD (peptic ulcer disease) 09/2006   H pylori  . Thyroid disease    Ho R thyroid noduel plus mild hyperthyroidism. Treated with radioiodine therapty on 04/2006. Dr Estrella Myrtle.   Past Surgical History:  Procedure Laterality Date  . ABDOMINAL HYSTERECTOMY    . BIOPSY THYROID  08/2008   Atypia and Hurtle cells, partial thyroidectomy   . BLADDER SUSPENSION     mesh sling  . Carotid ultrasound  09/10/09   No significant extracranial carotid artery stenosis, vertebrals are patent with antegrade flow.   . COLONOSCOPY  06/03/2005   Hyperplastic polyps, sigmoid diverticulosis, internal hemorroids  . FOREIGN BODY REMOVAL Right 02/21/2015   Procedure: RIGHT FOOT FOREIGN BODY REMOVAL ;  Surgeon: Marchia Bond, MD;  Location: [place];  Service: Orthopedics;  Laterality: Right;  . Ganglion removal  12/2005   R wrist subretinacular dorsal  ganglion  . MRI Head  09/20/09   No acute intracranial abn.  Signal abnormality suggestive of chronic small vessel ischemia, maximal in the right subinsular white and deep gray mater.   [name] PFT  11/2006   Obstructive pattern.  Poor response to bronchodilators.   . THYROIDECTOMY, PARTIAL  08/2008   Dr Ronnald Collum  . TONSILLECTOMY AND ADENOIDECTOMY  10/2004  . TRANSTHORACIC ECHOCARDIOGRAM  11/11/2117   Normal systolic function.  EF 55-60%.  Mild MR, atria ok, trivial pericardial effusion  . TVT  06/15/10   Tension-free Vaginal Tape, Dr Mancel Bale, for urge incontinence   Family History  Problem Relation Age of Onset  . Heart disease Mother   . Cirrhosis Sister   . Hypertension Sister   . Heart disease Maternal Grandmother   . Heart disease Maternal Grandfather   . Other Neg Hx    Social History  Substance Use Topics  . Smoking status: Current Every Day Smoker    Packs/day: 0.25    Years: 35.00    Types: Cigarettes  . Smokeless tobacco: Never Used     Comment: smokes less than 1 pack per week. ready to quitt.  . Alcohol use No   OB History    Gravida Para Term Preterm AB Living   2         2   SAB TAB Ectopic Multiple Live Births  Review of Systems  Constitutional: Negative.   Musculoskeletal:       As per history of present illness  Psychiatric/Behavioral: Positive for sleep disturbance.  All other systems reviewed and are negative.   Allergies  Seroquel [quetiapine fumarate]; Amoxicillin; Ciprofloxacin; Haloperidol lactate; Ketorolac tromethamine; Latuda [lurasidone hcl]; Thorazine [chlorpromazine]; and Wellbutrin [bupropion]  Home Medications   Prior to Admission medications   Medication Sig Start Date End Date Taking? Authorizing Provider  levothyroxine (SYNTHROID, LEVOTHROID) 88 MCG tablet Take 1 tablet (88 mcg total) by mouth daily before breakfast. 08/09/16   Bacigalupo, Dionne Bucy, MD  OLANZapine (ZYPREXA) 5 MG tablet Take 5 mg by mouth at bedtime.      [provider]  promethazine (PHENERGAN) 25 MG tablet Take one half tablet by mouth every 6-8 hours as needed for nausea or vomiting. 09/07/16   Janne Napoleon, NP  vortioxetine HBr (TRINTELLIX) 10 MG TABS Take 10 mg by mouth daily.    [provider]   Meds Ordered and Administered this Visit  Medications - No data to display  BP 112/66 (BP Location: Right Arm)   Pulse (!) 103   Temp 98.6 F (37 C) (Oral)   Resp 18   LMP 01/09/2012   SpO2 98%  No data found.   Physical Exam  Constitutional: She is oriented to person, place, and time. She appears well-developed and well-nourished. No distress.  Neck: Neck supple.  Pulmonary/Chest: Effort normal.  Musculoskeletal:  Right foot  all digits with thick yellow mycotic nails. There is a callus to the lateral and plantar aspect around the base of the fifth digit. There are 2 areas of thickening of the skin plantar aspect of the heel.   Neurological: She is alert and oriented to person, place, and time.  Skin: Skin is warm and dry.  Psychiatric: Her speech is normal and behavior is normal. Thought content normal. She exhibits a depressed mood.  Nursing note and vitals reviewed.   Urgent Care Course     Procedures (including critical care time)  Labs Review Labs Reviewed - No data to display  Imaging Review No results found.   Visual Acuity Review  Right Eye Distance:   Left Eye Distance:   Bilateral Distance:    Right Eye Near:   Left Eye Near:    Bilateral Near:         MDM   1. Onychomycosis   2. Pre-ulcerative corn or callous   3. Foot pain, right    Follow-up with the podiatrist was set on this page. Place a cushion in your shoe to help relieve the pressure off the heel. Meds ordered this encounter  Medications  . promethazine (PHENERGAN) 25 MG tablet    Sig: Take one half tablet by mouth every 6-8 hours as needed for nausea or vomiting.    Dispense:  10 tablet    Refill:  0    Order  Specific Question:   Supervising Provider    Answer:   Sherlene Shams [[id number]]  pt requested refill for this med due to frequent nausea. St takes a quarter or half of the tab prn.     Janne Napoleon, NP 09/07/16 1525

## 2016-09-07 NOTE — ED Triage Notes (Signed)
The patient presented to the [facility] with a complaint of right foot pain x 1 month. The patient denied any injury.

## 2016-09-07 NOTE — Discharge Instructions (Signed)
Follow-up with the podiatrist was set on this page. Place a cushion in your shoe to help relieve the pressure off the heel.

## 2016-09-08 ENCOUNTER — Telehealth: Payer: Self-pay | Admitting: Family Medicine

## 2016-09-08 DIAGNOSIS — F332 Major depressive disorder, recurrent severe without psychotic features: Secondary | ICD-10-CM | POA: Diagnosis not present

## 2016-09-08 NOTE — Telephone Encounter (Signed)
Pt is calling because she was seen at [facility] and was prescribed Phenergan. The way her insurance is set up she will need a PA and UC can not do that because of her insurance and they need her PCP to fill this out. Please call pharmacy and see what the next steps are. jw

## 2016-09-09 NOTE — Telephone Encounter (Signed)
Left message for patient to return call. When she calls back please inform her of message below.

## 2016-09-09 NOTE — Telephone Encounter (Signed)
I have never had to do a PA from a provider at urgent care.  I only do PAs if our provider was working at urgent care or inpatient discharge.  Pt can call her insurance and see what is on her formulary and have her PCP change it to that. I don't complete PA from urgent care.  Derl Barrow, RN

## 2016-09-14 ENCOUNTER — Other Ambulatory Visit: Payer: Self-pay | Admitting: *Deleted

## 2016-09-14 ENCOUNTER — Telehealth (HOSPITAL_COMMUNITY): Payer: Self-pay | Admitting: Emergency Medicine

## 2016-09-14 NOTE — Telephone Encounter (Signed)
Pt called.... sts she was seen on 6/5 and her Rx for promethazine was not called in   Called [facility]) to confirm... Spoke w/pharmacist and they did not receive any Rx for pt even though on EPIC it showed it was confirmed by pharmacy  Verbally called in Rx... Notified pt.

## 2016-09-14 NOTE — Telephone Encounter (Signed)
Pt needs an appt. Phenergan not refilled as never prescribed by this clinic.  Archie Patten, MD [facility] Resident  09/14/2016, 12:11 PM\

## 2016-09-20 ENCOUNTER — Ambulatory Visit: Payer: Self-pay | Admitting: Family Medicine

## 2016-10-04 ENCOUNTER — Other Ambulatory Visit: Payer: Self-pay | Admitting: Family Medicine

## 2016-10-22 ENCOUNTER — Telehealth: Payer: Self-pay | Admitting: Family Medicine

## 2016-10-22 NOTE — Telephone Encounter (Signed)
Pt would like PCP/nurse to call her regarding appetite. ep

## 2016-10-22 NOTE — Telephone Encounter (Signed)
Returned patient's call. C/o poor appetite 2/2 not being able to taste food and no energy X 1 year following depressive episode. States she takes lexapro and is followed by [facility]. Last visit at [facility] 4 weeks ago. Will have to call [facility] 8/1 to schedule f/u appt for August. Reports taking new dose of synthroid 88 mcg every am since dose changed in May 2018. States she stays in her room at [facility] and does not get out which depresses her. Unable to walk 2/2 no energy and calluses on feet for which she has appt at [facility] on 7/25. Discussed going outside and sitting on park bench at [place]. States she will give that a try this weekend. Encouraged to keep eating and drinking Ensure even if she can't taste it for the nutritional value. Patient would appreciate call from [facility] on Monday 7/23. Scheduled OV with new PCP on 8/3. Hubbard Hartshorn, RN, BSN

## 2016-10-25 ENCOUNTER — Telehealth: Payer: Self-pay | Admitting: Psychology

## 2016-10-25 NOTE — Telephone Encounter (Signed)
[name] called patient to check in on her depression and lack of appetite. Message left.

## 2016-10-26 NOTE — Telephone Encounter (Signed)
Pt called back, she would like a return call from [facility] but needs it to be after 4 as she works during the day. Lesslie Mossa, Salome Spotted, CMA

## 2016-10-27 ENCOUNTER — Ambulatory Visit: Payer: Medicare Other | Admitting: Podiatry

## 2016-11-01 ENCOUNTER — Telehealth: Payer: Self-pay | Admitting: Psychology

## 2016-11-05 ENCOUNTER — Ambulatory Visit: Payer: Self-pay | Admitting: Family Medicine

## 2016-11-08 ENCOUNTER — Telehealth: Payer: Self-pay | Admitting: Psychology

## 2016-11-16 ENCOUNTER — Other Ambulatory Visit: Payer: Self-pay | Admitting: Family Medicine

## 2016-11-16 NOTE — Telephone Encounter (Signed)
Pt is requesting a refill on phenergan but would like to know what to take OTC in the meantime.   Spoke with Dr. McDiarmid who suggested Emetrol (Liquid), relayed message to patient.  Shann Lewellyn, Salome Spotted, CMA

## 2016-11-16 NOTE — Telephone Encounter (Signed)
Pt would like to have phenergan called in for nausea. Pt uses [facility]. ep

## 2016-11-17 MED ORDER — PROMETHAZINE HCL 25 MG PO TABS: ORAL_TABLET | ORAL | 0 refills | Status: DC

## 2016-11-19 ENCOUNTER — Encounter: Payer: Self-pay | Admitting: Internal Medicine

## 2016-11-19 ENCOUNTER — Ambulatory Visit (INDEPENDENT_AMBULATORY_CARE_PROVIDER_SITE_OTHER): Payer: Medicare Other | Admitting: Internal Medicine

## 2016-11-19 VITALS — BP 150/88 | HR 94 | Temp 98.1°F | Wt 87.6 lb

## 2016-11-19 DIAGNOSIS — R221 Localized swelling, mass and lump, neck: Secondary | ICD-10-CM

## 2016-11-19 DIAGNOSIS — E041 Nontoxic single thyroid nodule: Secondary | ICD-10-CM | POA: Diagnosis present

## 2016-11-19 DIAGNOSIS — L84 Corns and callosities: Secondary | ICD-10-CM

## 2016-11-19 DIAGNOSIS — E039 Hypothyroidism, unspecified: Secondary | ICD-10-CM

## 2016-11-19 MED ORDER — PROMETHAZINE HCL 25 MG PO TABS: ORAL_TABLET | ORAL | 0 refills | Status: DC

## 2016-11-19 MED ORDER — PROMETHAZINE HCL 25 MG PO TABS: ORAL_TABLET | ORAL | 1 refills | Status: DC

## 2016-11-19 NOTE — Patient Instructions (Addendum)
Ms. Toole,  I do not feel a bump today, but I have ordered an [place] of your thyroid for monitoring.   I have also ordered thyroid stimulating hormone level to see if you are on high enough dose of synthroid.  Try protein drink like ensure to help with weight if not able to tolerate regular meals. I have ordered phenergan to your pharmacy.  I will place a referral to Podiatry. I do not see that you had been scheduled before. Expect a call within a week for scheduling.  Best, Dr. Ola Spurr

## 2016-11-20 DIAGNOSIS — L84 Corns and callosities: Secondary | ICD-10-CM | POA: Insufficient documentation

## 2016-11-20 LAB — TSH: TSH: 9.95 u[IU]/mL — ABNORMAL HIGH (ref 0.450–4.500)

## 2016-11-20 NOTE — Progress Notes (Signed)
[facility] Progress Note  Subjective:  [name] is a 71 y.o. female with history of hypothyroidism, thyroid nodule s/p resection and radioiodine therapy in 2008, bipolar disorder and COPD who presents for "knot in her neck." She says she noticed a bump on the L side of the front of her neck a couple days ago. She is concerned because she has not had a recent US of her thyroid. She has been having continued fatigue and weight loss without much of an appetite. She also reports being sick on her stomach over the weekend. She does drink ensure twice a day. She denies trouble swallowing. Follows with [facility] for mood. Says she has not noticed improvement in energy level since starting increased dose of levothyroxine this spring (88 mcg from 75 mcg).  ROS: No fevers/chills, no cough  Social: former smoker  Allergies  Allergen Reactions  . Seroquel [Quetiapine Fumarate] Other (See Comments)    Reaction:  Drowsiness   . Amoxicillin Nausea Only and Other (See Comments)    Has patient had a PCN reaction causing immediate rash, facial/tongue/throat swelling, SOB or lightheadedness with hypotension: No Has patient had a PCN reaction causing severe rash involving mucus membranes or skin necrosis: No Has patient had a PCN reaction that required hospitalization No Has patient had a PCN reaction occurring within the last 10 years: No If all of the above answers are "NO", then may proceed with Cephalosporin use.  . Ciprofloxacin Other (See Comments)    Reaction:  Unknown   . Haloperidol Lactate Other (See Comments)    Reaction:  Arm stiffness   . Ketorolac Tromethamine Other (See Comments)    Reaction:  Unknown   . Latuda [Lurasidone Hcl] Other (See Comments)    Pt states that this medication makes her feel "weird".    . Thorazine [Chlorpromazine] Other (See Comments)    Reaction:  Unknown   . Wellbutrin [Bupropion] Other (See Comments)    Pt states that this medication makes her  feel "weird".      Objective: Blood pressure (!) 150/88, pulse 94, temperature 98.1 F (36.7 C), temperature source Oral, weight 87 lb 9.6 oz (39.7 kg), last menstrual period 01/09/2012, SpO2 96 %. Body mass index is 21.12 kg/m. Constitutional: Thin, very petite female in NAD HENT: MMM Neck: No palpable thyroid mass appreciated though some asymmetry R vs L. No TTP over anterior neck. No lymphadenopathy noted.  Cardiovascular: RRR, S1, S2, no m/r/g.  Pulmonary/Chest: Effort normal and breath sounds normal. No respiratory distress.  Abdominal: Soft. +BS, NT, ND Skin: Skin is warm and dry. No rash noted. Large calluses over various aspects of R foot.  Psychiatric: Anxious affect.  Vitals reviewed  Assessment/Plan: Thyroid nodule - Appreciated by patient but not this examiner. There is some asymmetric of the thyroid, however. Last Korea of thyroid was done in 2016 without new nodules noted. Will order thyroid US for monitoring. Do not suspect GI etiology for sensation appreciated by patient, as patient denies dysphagia.  - Recheck TSH, as may need further increase in levothyroxine - Recommended patient f/u with [facility] for medication management of mood  Callus of foot - Placed referral to Podiatry, as calluses are very thick and causing discomfort for patient. - Advised getting a new pair of sneakers vs sole insert because little support in current footwear  Meds ordered this encounter  Medications  . DISCONTD: promethazine (PHENERGAN) 25 MG tablet    Sig: Take one half tablet by mouth  every 6-8 hours as needed for nausea or vomiting.    Dispense:  20 tablet    Refill:  0  . promethazine (PHENERGAN) 25 MG tablet    Sig: Take one half tablet by mouth every 6-8 hours as needed for nausea or vomiting.    Dispense:  20 tablet    Refill:  1   Follow-up pending imaging/lab results.  Olene Floss, MD [place], PGY-3

## 2016-11-20 NOTE — Assessment & Plan Note (Signed)
-   Appreciated by patient but not this examiner. There is some asymmetric of the thyroid, however. Last Korea of thyroid was done in 2016 without new nodules noted. Will order thyroid US for monitoring. Do not suspect GI etiology for sensation appreciated by patient, as patient denies dysphagia.  - Recheck TSH, as may need further increase in levothyroxine - Recommended patient f/u with [facility] for medication management of mood

## 2016-11-20 NOTE — Assessment & Plan Note (Signed)
-   Placed referral to Podiatry, as calluses are very thick and causing discomfort for patient. - Advised getting a new pair of sneakers vs sole insert because little support in current footwear

## 2016-11-23 ENCOUNTER — Telehealth: Payer: Self-pay | Admitting: Internal Medicine

## 2016-11-23 MED ORDER — LEVOTHYROXINE SODIUM 125 MCG PO TABS: 125.0000 ug | ORAL_TABLET | Freq: Every day | ORAL | 1 refills | Status: DC

## 2016-11-23 NOTE — Telephone Encounter (Signed)
Patient with elevated TSH even on increased dose of 88 mcg. Will increase levothyroxine to 125 mcg, as TSH 9.950 and patient denies missing any doses. She was to get thyroid ultrasound 8/23 but says she will have to reschedule it. Provided number for [facility]. Asked patient to call [facility] if unable to reschedule.   Olene Floss, MD [place], PGY-3

## 2016-11-25 ENCOUNTER — Ambulatory Visit (HOSPITAL_COMMUNITY): Admission: RE | Admit: 2016-11-25 | Payer: Medicare Other | Source: Ambulatory Visit

## 2016-11-29 ENCOUNTER — Encounter (HOSPITAL_COMMUNITY): Payer: Self-pay | Admitting: Nurse Practitioner

## 2016-11-29 ENCOUNTER — Emergency Department (HOSPITAL_COMMUNITY)
Admission: EM | Admit: 2016-11-29 | Discharge: 2016-11-30 | Disposition: A | Discharge status: Home / Self Care | Payer: Medicare Other | Attending: Emergency Medicine | Admitting: Emergency Medicine

## 2016-11-29 DIAGNOSIS — R221 Localized swelling, mass and lump, neck: Secondary | ICD-10-CM | POA: Insufficient documentation

## 2016-11-29 DIAGNOSIS — R531 Weakness: Secondary | ICD-10-CM | POA: Diagnosis not present

## 2016-11-29 DIAGNOSIS — R404 Transient alteration of awareness: Secondary | ICD-10-CM | POA: Diagnosis not present

## 2016-11-29 DIAGNOSIS — Z5321 Procedure and treatment not carried out due to patient leaving prior to being seen by health care provider: Secondary | ICD-10-CM | POA: Insufficient documentation

## 2016-11-29 NOTE — ED Triage Notes (Signed)
Pt presents with c/o neck mass. She was seen by her primary care provider for the mass and an ultrasound was ordered but she did not have transportation to the ultrasound. She called EMS for transport today hoping to have the ultrasound done during her ED visit.

## 2016-12-08 ENCOUNTER — Ambulatory Visit
Admission: RE | Admit: 2016-12-08 | Discharge: 2016-12-08 | Disposition: A | Discharge status: Home / Self Care | Payer: Medicare Other | Source: Ambulatory Visit | Attending: Family Medicine | Admitting: Family Medicine

## 2016-12-08 ENCOUNTER — Encounter (HOSPITAL_COMMUNITY): Payer: Self-pay | Admitting: Emergency Medicine

## 2016-12-08 ENCOUNTER — Emergency Department (HOSPITAL_COMMUNITY)
Admission: EM | Admit: 2016-12-08 | Discharge: 2016-12-08 | Disposition: A | Discharge status: Home / Self Care | Payer: Medicare Other | Attending: Emergency Medicine | Admitting: Emergency Medicine

## 2016-12-08 DIAGNOSIS — E041 Nontoxic single thyroid nodule: Secondary | ICD-10-CM | POA: Diagnosis not present

## 2016-12-08 DIAGNOSIS — Z5321 Procedure and treatment not carried out due to patient leaving prior to being seen by health care provider: Secondary | ICD-10-CM | POA: Insufficient documentation

## 2016-12-08 DIAGNOSIS — R69 Illness, unspecified: Secondary | ICD-10-CM | POA: Diagnosis not present

## 2016-12-08 DIAGNOSIS — R531 Weakness: Secondary | ICD-10-CM | POA: Diagnosis present

## 2016-12-08 DIAGNOSIS — R221 Localized swelling, mass and lump, neck: Secondary | ICD-10-CM

## 2016-12-08 LAB — BASIC METABOLIC PANEL
Anion gap: 7 (ref 5–15)
BUN: 14 mg/dL (ref 6–20)
CO2: 30 mmol/L (ref 22–32)
Calcium: 9.4 mg/dL (ref 8.9–10.3)
Chloride: 105 mmol/L (ref 101–111)
Creatinine, Ser: 0.74 mg/dL (ref 0.44–1.00)
GFR calc Af Amer: >60 mL/min (ref >60)
GFR calc non Af Amer: >60 mL/min (ref >60)
Glucose, Bld: 98 mg/dL (ref 65–99)
Potassium: 3.8 mmol/L (ref 3.5–5.1)
Sodium: 142 mmol/L (ref 135–145)

## 2016-12-08 LAB — CBC
HCT: 39.1 % (ref 36.0–46.0)
Hemoglobin: 13.1 g/dL (ref 12.0–15.0)
MCH: 33.2 pg (ref 26.0–34.0)
MCHC: 33.5 g/dL (ref 30.0–36.0)
MCV: 99.2 fL (ref 78.0–100.0)
Platelets: 285 10*3/uL (ref 150–400)
RBC: 3.94 MIL/uL (ref 3.87–5.11)
RDW: 12.9 % (ref 11.5–15.5)
WBC: 5.8 10*3/uL (ref 4.0–10.5)

## 2016-12-08 NOTE — ED Triage Notes (Addendum)
Patient is complaining of being weak , vomiting, congested, and a lump on her thyroid. Patient states that she has had this for a year. Patient states she has no appetite. Patient states she feels nauseated every time she eats.

## 2016-12-08 NOTE — ED Notes (Signed)
Patient left due to wait.

## 2016-12-13 ENCOUNTER — Telehealth: Payer: Self-pay | Admitting: Family Medicine

## 2016-12-13 NOTE — Telephone Encounter (Signed)
Pt says her left hand is numb all the time at night.  She has her days and night mixed up.  She is wanting a nurse to call asap.

## 2016-12-15 ENCOUNTER — Other Ambulatory Visit: Payer: Self-pay | Admitting: *Deleted

## 2016-12-15 MED ORDER — LEVOTHYROXINE SODIUM 125 MCG PO TABS: 125.0000 ug | ORAL_TABLET | Freq: Every day | ORAL | 1 refills | Status: DC

## 2016-12-20 ENCOUNTER — Encounter: Payer: Self-pay | Admitting: Psychology

## 2016-12-20 NOTE — Progress Notes (Signed)
At the request of Howell Rucks, RN, I called to check on Ms. [name]. She stated that she is very depressed and has no appetite. She reported that her hair is falling out and that she looks "bad." She read a [place] poem that she had written. She plans to come to [facility] later this week to follow up on some of her concerns. She agreed to try to go outside to get some sunshine. She also agreed to ask her daughter to visit with her so that she can have some social interaction. Her daughter does hair so she may ask for assistance with this. She reported that she is taking Lexapro and thyroid medication as directed. [name] reported that [facility] visit her each week. She would like to go to church but does not have the energy. [name] is not interested in [facility]. I asked her to call me back if she needed to talk.

## 2016-12-21 ENCOUNTER — Ambulatory Visit: Payer: Self-pay | Admitting: Family Medicine

## 2016-12-23 ENCOUNTER — Ambulatory Visit: Payer: Self-pay | Admitting: Family Medicine

## 2017-02-02 DIAGNOSIS — F332 Major depressive disorder, recurrent severe without psychotic features: Secondary | ICD-10-CM | POA: Diagnosis not present

## 2017-02-28 ENCOUNTER — Encounter: Payer: Self-pay | Admitting: Internal Medicine

## 2017-02-28 ENCOUNTER — Other Ambulatory Visit: Payer: Self-pay

## 2017-02-28 ENCOUNTER — Ambulatory Visit (INDEPENDENT_AMBULATORY_CARE_PROVIDER_SITE_OTHER): Payer: Medicare Other | Admitting: Internal Medicine

## 2017-02-28 ENCOUNTER — Other Ambulatory Visit: Payer: Self-pay | Admitting: Internal Medicine

## 2017-02-28 VITALS — BP 148/82 | HR 66 | Temp 98.2°F | Ht <= 58 in | Wt 93.0 lb

## 2017-02-28 DIAGNOSIS — N63 Unspecified lump in unspecified breast: Secondary | ICD-10-CM

## 2017-02-28 DIAGNOSIS — N6320 Unspecified lump in the left breast, unspecified quadrant: Secondary | ICD-10-CM

## 2017-02-28 DIAGNOSIS — J3489 Other specified disorders of nose and nasal sinuses: Secondary | ICD-10-CM

## 2017-02-28 DIAGNOSIS — R630 Anorexia: Secondary | ICD-10-CM | POA: Diagnosis not present

## 2017-02-28 MED ORDER — AMOXICILLIN-POT CLAVULANATE 875-125 MG PO TABS: 1.0000 | ORAL_TABLET | Freq: Two times a day (BID) | ORAL | 0 refills | Status: DC

## 2017-02-28 MED ORDER — PROMETHAZINE HCL 25 MG PO TABS: ORAL_TABLET | ORAL | 0 refills | Status: DC

## 2017-02-28 MED ORDER — LEVOTHYROXINE SODIUM 125 MCG PO TABS: 125.0000 ug | ORAL_TABLET | Freq: Every day | ORAL | 1 refills | Status: DC

## 2017-02-28 NOTE — Progress Notes (Signed)
[facility] Progress Note  Subjective:  [name] is a 71 y.o. female with history of HTN, fatigue, bipolar affective disorder, and hypothyroidism who presents for lump of left breast. She noticed this at the base of her left breast about 2 weeks ago. She has not had pain. She denies skin changes or nipple discharge.  Patient also has complaint of sinus pain. Says she has been having symptoms for about 2 months. She has associated headaches and some nasal drainage. She takes aspirin occasionally for the headaches. She reports not being able to tolerate nasal sprays.   In addition, patient requests refill of phenergan for nausea. She has concerns about weight loss, but has been able to incorporate ensure into her diet. Weight stable since last year but about 10-15 lbs down from 1.5-2 years ago. Reports feeling weak but does not want an aid.   Family history: mother with stomach cancer; m. aunt with colon cancer  HM: Patient declines flu shot.    Allergies  Allergen Reactions  . Seroquel [Quetiapine Fumarate] Other (See Comments)    Reaction:  Drowsiness   . Amoxicillin Nausea Only and Other (See Comments)    Has patient had a PCN reaction causing immediate rash, facial/tongue/throat swelling, SOB or lightheadedness with hypotension: No Has patient had a PCN reaction causing severe rash involving mucus membranes or skin necrosis: No Has patient had a PCN reaction that required hospitalization No Has patient had a PCN reaction occurring within the last 10 years: No If all of the above answers are "NO", then may proceed with Cephalosporin use.  . Ciprofloxacin Other (See Comments)    Reaction:  Unknown   . Haloperidol Lactate Other (See Comments)    Reaction:  Arm stiffness   . Ketorolac Tromethamine Other (See Comments)    Reaction:  Unknown   . Latuda [Lurasidone Hcl] Other (See Comments)    Pt states that this medication makes her feel "weird".    . Thorazine  [Chlorpromazine] Other (See Comments)    Reaction:  Unknown   . Wellbutrin [Bupropion] Other (See Comments)    Pt states that this medication makes her feel "weird".      Social History   Tobacco Use  . Smoking status: Smoker, Current Status Unknown    Packs/day: 0.25    Years: 35.00    Pack years: 8.75    Types: Cigarettes  . Smokeless tobacco: Never Used  . Tobacco comment: patient stated she does not smoke when asked  Substance Use Topics  . Alcohol use: No    Alcohol/week: 0.0 oz    Objective: Blood pressure (!) 148/82, pulse 66, temperature 98.2 F (36.8 C), temperature source Oral, height 4' 7.75" (1.416 m), weight 93 lb (42.2 kg), last menstrual period 01/09/2012, SpO2 96 %. Body mass index is 21.04 kg/m. Constitutional: Thin female, in NAD HENT: MMM, nasal congestion present, tenderness to percussion over maxillary sinuses Cardiovascular: RRR, S1, S2, no m/r/g.  Pulmonary/Chest: Effort normal and breath sounds normal.  Breast: Chaperone present. Two ~1 cm nodules felt at 12 and 6 o'clock of left breast. Tenderness over both breasts with exam. No overlying skin changes or axillary lymphadenopathy appreciated.  Psychiatric: Anxious affect.  Vitals reviewed  Patient had normal bilateral diagnostic mammogram 07/15/14, performed for patient report of mass in right upper axilla.   Assessment/Plan: Mass of left breast - Overdue for routine mammogram and new masses felt on exam today. - Ordered diagnostic bilateral mammogram to further assess.  Discussed with [facility] to override coding issue.   Sinus pain - Given duration of symptoms, facial tenderness, and patient's inability to tolerate nasal sprays, will prescribe augmentin for suspected sinusitis though would likely benefit most from routine use of nasal sprays.  - Follow-up prn; consider referral to ENT if no improvement  Loss of appetite - Ongoing, weight stable. Encouraged patient to follow-up with PCP  for general check-up.  - Continue to encourage smoking cessation.  - Inquire further about food security; consider SW consult  Follow-up at earliest convenience for general check-up with PCP.  Olene Floss, MD [place], PGY-3

## 2017-02-28 NOTE — Patient Instructions (Signed)
Ms. Zalar,  Thank you for coming in. Please take augmentin twice daily for 7 days for your sinus troubles.   I recommend getting mammogram performed to look into breast lumps felt on exam. We will make a plan based off of these results.  Please make an appointment with your PCP for general check-up.   Best, Dr. Ola Spurr

## 2017-03-01 ENCOUNTER — Telehealth: Payer: Self-pay | Admitting: *Deleted

## 2017-03-01 NOTE — Telephone Encounter (Signed)
Received fax from [place] requesting prior authorization of promethazine .  Completed via cover my meds.  Your information has been submitted to [place] [employer] Part [employer]. [employer] will review the request and will issue a decision, typically within 1-3 days from your submission. You can check the updated outcome later by reopening this request.  If [employer] has not responded in 1-3 days or if you have any questions about your ePA request, please contact [place] [employer] at [phone]. If you think there may be a problem with your PA request, use our live chat feature at the bottom right.  Will check back tomorrow.  Ersel Wadleigh, Salome Spotted, CMA

## 2017-03-01 NOTE — Telephone Encounter (Signed)
[place]: [id number] PA Case ID: [id number] Rx #: [id number]  Outcome: Approved today Your request has been approved Drug:Promethazine HCl 25MG  OR TABS   Pharmacy informed. Clinton Sawyer, Salome Spotted, [place]

## 2017-03-02 ENCOUNTER — Encounter: Payer: Self-pay | Admitting: Internal Medicine

## 2017-03-02 DIAGNOSIS — J3489 Other specified disorders of nose and nasal sinuses: Secondary | ICD-10-CM | POA: Insufficient documentation

## 2017-03-02 NOTE — Assessment & Plan Note (Signed)
-   Overdue for routine mammogram and new masses felt on exam today. - Ordered diagnostic bilateral mammogram to further assess. Discussed with [facility] to override coding issue.

## 2017-03-02 NOTE — Assessment & Plan Note (Signed)
-   Given duration of symptoms, facial tenderness, and patient's inability to tolerate nasal sprays, will prescribe augmentin for suspected sinusitis though would likely benefit most from routine use of nasal sprays.  - Follow-up prn; consider referral to ENT if no improvement

## 2017-03-02 NOTE — Assessment & Plan Note (Addendum)
-   Ongoing, weight stable. Encouraged patient to follow-up with PCP for general check-up.  - Continue to encourage smoking cessation.  - Inquire further about food security; consider SW consult

## 2017-03-03 ENCOUNTER — Ambulatory Visit
Admission: RE | Admit: 2017-03-03 | Discharge: 2017-03-03 | Disposition: A | Discharge status: Home / Self Care | Payer: Medicare Other | Source: Ambulatory Visit | Attending: Family Medicine | Admitting: Family Medicine

## 2017-03-03 ENCOUNTER — Other Ambulatory Visit: Payer: Self-pay | Admitting: Internal Medicine

## 2017-03-03 DIAGNOSIS — N63 Unspecified lump in unspecified breast: Secondary | ICD-10-CM

## 2017-03-03 DIAGNOSIS — N6323 Unspecified lump in the left breast, lower outer quadrant: Secondary | ICD-10-CM | POA: Diagnosis not present

## 2017-03-03 DIAGNOSIS — R922 Inconclusive mammogram: Secondary | ICD-10-CM | POA: Diagnosis not present

## 2017-03-03 DIAGNOSIS — R928 Other abnormal and inconclusive findings on diagnostic imaging of breast: Secondary | ICD-10-CM

## 2017-03-03 DIAGNOSIS — N6324 Unspecified lump in the left breast, lower inner quadrant: Secondary | ICD-10-CM | POA: Diagnosis not present

## 2017-03-03 DIAGNOSIS — N6320 Unspecified lump in the left breast, unspecified quadrant: Secondary | ICD-10-CM

## 2017-03-04 ENCOUNTER — Other Ambulatory Visit: Payer: Self-pay

## 2017-03-04 ENCOUNTER — Ambulatory Visit
Admission: RE | Admit: 2017-03-04 | Discharge: 2017-03-04 | Disposition: A | Discharge status: Home / Self Care | Payer: Medicare Other | Source: Ambulatory Visit | Attending: Family Medicine | Admitting: Family Medicine

## 2017-03-04 DIAGNOSIS — N6320 Unspecified lump in the left breast, unspecified quadrant: Secondary | ICD-10-CM

## 2017-03-04 DIAGNOSIS — C50412 Malignant neoplasm of upper-outer quadrant of left female breast: Secondary | ICD-10-CM | POA: Diagnosis not present

## 2017-03-04 DIAGNOSIS — R928 Other abnormal and inconclusive findings on diagnostic imaging of breast: Secondary | ICD-10-CM

## 2017-03-04 DIAGNOSIS — N6321 Unspecified lump in the left breast, upper outer quadrant: Secondary | ICD-10-CM | POA: Diagnosis not present

## 2017-03-04 DIAGNOSIS — C50812 Malignant neoplasm of overlapping sites of left female breast: Secondary | ICD-10-CM | POA: Diagnosis not present

## 2017-03-04 DIAGNOSIS — N6324 Unspecified lump in the left breast, lower inner quadrant: Secondary | ICD-10-CM | POA: Diagnosis not present

## 2017-03-04 DIAGNOSIS — N6323 Unspecified lump in the left breast, lower outer quadrant: Secondary | ICD-10-CM | POA: Diagnosis not present

## 2017-03-07 ENCOUNTER — Telehealth: Payer: Self-pay | Admitting: Oncology

## 2017-03-07 NOTE — Telephone Encounter (Signed)
Confirmed afternoon [facility] appointment for 03/16/17 with patient, will mail packet of information to patient per her request

## 2017-03-08 ENCOUNTER — Other Ambulatory Visit: Payer: Self-pay | Admitting: *Deleted

## 2017-03-08 DIAGNOSIS — Z170 Estrogen receptor positive status [ER+]: Principal | ICD-10-CM

## 2017-03-08 DIAGNOSIS — C50812 Malignant neoplasm of overlapping sites of left female breast: Secondary | ICD-10-CM | POA: Insufficient documentation

## 2017-03-09 ENCOUNTER — Other Ambulatory Visit: Payer: Self-pay

## 2017-03-16 ENCOUNTER — Ambulatory Visit: Payer: Medicare Other | Admitting: Radiation Oncology

## 2017-03-16 ENCOUNTER — Ambulatory Visit: Payer: Medicare Other | Admitting: Oncology

## 2017-03-16 ENCOUNTER — Other Ambulatory Visit: Payer: Medicare Other

## 2017-03-16 ENCOUNTER — Telehealth: Payer: Self-pay | Admitting: Family Medicine

## 2017-03-16 NOTE — Telephone Encounter (Signed)
Pt was unable to keep her appt at the [facility] today because her daughter wants to be there and she lives in [place].  She has tried to reschedule the appt but always gets voicemail.  Can dr fitzgerald help her reschedule the appt to next wed?

## 2017-03-17 ENCOUNTER — Telehealth: Payer: Self-pay | Admitting: *Deleted

## 2017-03-17 DIAGNOSIS — C50812 Malignant neoplasm of overlapping sites of left female breast: Secondary | ICD-10-CM

## 2017-03-17 DIAGNOSIS — Z170 Estrogen receptor positive status [ER+]: Principal | ICD-10-CM

## 2017-03-17 NOTE — Telephone Encounter (Signed)
Confirmed [facility] for 03/23/17 at 1215 .  Instructions and contact information given.

## 2017-03-21 NOTE — Telephone Encounter (Signed)
LMOVM for pt to call us back. Deseree Blount, CMA  

## 2017-03-23 ENCOUNTER — Ambulatory Visit (HOSPITAL_BASED_OUTPATIENT_CLINIC_OR_DEPARTMENT_OTHER): Payer: Medicare Other | Admitting: Hematology and Oncology

## 2017-03-23 ENCOUNTER — Encounter: Payer: Self-pay | Admitting: Hematology and Oncology

## 2017-03-23 ENCOUNTER — Ambulatory Visit
Admission: RE | Admit: 2017-03-23 | Discharge: 2017-03-23 | Disposition: A | Discharge status: Home / Self Care | Payer: Medicare Other | Source: Ambulatory Visit | Attending: Radiation Oncology | Admitting: Radiation Oncology

## 2017-03-23 ENCOUNTER — Ambulatory Visit: Payer: Self-pay | Admitting: General Surgery

## 2017-03-23 ENCOUNTER — Ambulatory Visit: Payer: Medicare Other | Admitting: Physical Therapy

## 2017-03-23 ENCOUNTER — Other Ambulatory Visit (HOSPITAL_BASED_OUTPATIENT_CLINIC_OR_DEPARTMENT_OTHER): Payer: Medicare Other

## 2017-03-23 VITALS — BP 167/99 | HR 81 | Temp 98.1°F | Resp 18 | Ht <= 58 in | Wt 89.3 lb

## 2017-03-23 DIAGNOSIS — Z170 Estrogen receptor positive status [ER+]: Principal | ICD-10-CM

## 2017-03-23 DIAGNOSIS — C50812 Malignant neoplasm of overlapping sites of left female breast: Secondary | ICD-10-CM

## 2017-03-23 DIAGNOSIS — R634 Abnormal weight loss: Secondary | ICD-10-CM

## 2017-03-23 DIAGNOSIS — F329 Major depressive disorder, single episode, unspecified: Secondary | ICD-10-CM | POA: Diagnosis not present

## 2017-03-23 DIAGNOSIS — Z780 Asymptomatic menopausal state: Secondary | ICD-10-CM

## 2017-03-23 LAB — CBC WITH DIFFERENTIAL/PLATELET
BASO%: 0.7 % (ref 0.0–2.0)
Basophils Absolute: 0 10*3/uL (ref 0.0–0.1)
EOS%: 1.9 % (ref 0.0–7.0)
Eosinophils Absolute: 0.1 10*3/uL (ref 0.0–0.5)
HCT: 41.4 % (ref 34.8–46.6)
HGB: 13.7 g/dL (ref 11.6–15.9)
LYMPH%: 12.3 % — ABNORMAL LOW (ref 14.0–49.7)
MCH: 32.2 pg (ref 25.1–34.0)
MCHC: 33.1 g/dL (ref 31.5–36.0)
MCV: 97.3 fL (ref 79.5–101.0)
MONO#: 0.4 10*3/uL (ref 0.1–0.9)
MONO%: 6.7 % (ref 0.0–14.0)
NEUT#: 4.7 10*3/uL (ref 1.5–6.5)
NEUT%: 78.4 % — ABNORMAL HIGH (ref 38.4–76.8)
Platelets: 286 10*3/uL (ref 145–400)
RBC: 4.26 10*6/uL (ref 3.70–5.45)
RDW: 13.1 % (ref 11.2–14.5)
WBC: 6.1 10*3/uL (ref 3.9–10.3)
lymph#: 0.7 10*3/uL — ABNORMAL LOW (ref 0.9–3.3)

## 2017-03-23 LAB — COMPREHENSIVE METABOLIC PANEL
ALT: 8 U/L (ref 0–55)
AST: 13 U/L (ref 5–34)
Albumin: 3.7 g/dL (ref 3.5–5.0)
Alkaline Phosphatase: 97 U/L (ref 40–150)
Anion Gap: 12 mEq/L — ABNORMAL HIGH (ref 3–11)
BUN: 15.2 mg/dL (ref 7.0–26.0)
CO2: 28 mEq/L (ref 22–29)
Calcium: 9.2 mg/dL (ref 8.4–10.4)
Chloride: 104 mEq/L (ref 98–109)
Creatinine: 0.9 mg/dL (ref 0.6–1.1)
EGFR: >60 mL/min/{1.73_m2} (ref >60)
Glucose: 136 mg/dl (ref 70–140)
Potassium: 3 mEq/L — CL (ref 3.5–5.1)
Sodium: 144 mEq/L (ref 136–145)
Total Bilirubin: 0.39 mg/dL (ref 0.20–1.20)
Total Protein: 6.2 g/dL — ABNORMAL LOW (ref 6.4–8.3)

## 2017-03-23 NOTE — Progress Notes (Addendum)
Radiation Oncology         [phone]) [phone] ________________________________  Name: [name]        MRN: [id number]  Date of Service: 03/23/2017 DOB: 19-Dec-1945  [id number], Edison Nasuti, MD  Guadalupe Dawn, MD     REFERRING PHYSICIAN: Guadalupe Dawn, MD    ATTESTATION Please see the note from Shona Simpson, PA-C from today's visit for more details of today's encounter.  I have personally performed a face to face diagnostic evaluation on could be made after seeing her surgical results this patient and devised the following assessment and plan.  The patient was seen today in multidisciplinary breast clinic.  After reviewing her case, the patient is planning to proceed with mastectomy.  I discussed with the patient that a final recommendation for radiation treatment could be made after seeing her final surgical results.  At this time, it appears that the patient may not need radiation treatment.  The patient suffers from some comorbidities including significant depression/fatigue and she is very much hopeful that she will not need radiation treatment.  All of her questions were answered and I look forward to seeing her postoperatively if necessary to help further coordinate her care.  Kyung Rudd, MD   DIAGNOSIS: The encounter diagnosis was Malignant neoplasm of overlapping sites of left breast in female, estrogen receptor positive ([place]).   HISTORY OF PRESENT ILLNESS: [name] is a 71 y.o. female seen in multidisciplinary breast clinic for a newly diagnosed left breast cancer. The patient self palpated several masses in the left breast. Mammogram on 03/03/17 showed an irregular hypoechoic mass in the 6 o'clock position, 2 cm from the nipple measuring 2.0 x 2.1 x 1.2 cm as well as a 3.5 x 1.0 x 2.3 cm  mass at the 1 o'clock location, 5 cm from the nipple. The axilla was negative by ultrasound. She underwent stereotactic biopsy on 03/04/17 which revealed grade II invasive mammary carcinoma,  mammary carcinoma in situ and lymphovascular and perineural invasion associated with both masses. Hormone receptor status is ER 100%, PR 0%, Ki67 5%, and Her2-. The patient presents today to discuss the role of radiation therapy as part of her disease management.  PREVIOUS RADIATION THERAPY: No   PAST MEDICAL HISTORY:  Past Medical History:  Diagnosis Date  . Anxiety   . COPD (chronic obstructive pulmonary disease) (HCC)    Due to long history of tobacco abuse, still smoking  . Depression   . Diverticulosis of colon   . GERD (gastroesophageal reflux disease)   . Heart murmur   . History of uterine prolapse 10/2004   Dr Cletis Media  . Hypertension   . OCD (obsessive compulsive disorder)   . Osteoporosis   . PUD (peptic ulcer disease) 09/2006   H pylori  . Thyroid disease    Ho R thyroid noduel plus mild hyperthyroidism. Treated with radioiodine therapty on 04/2006. Dr Estrella Myrtle.       PAST SURGICAL HISTORY: Past Surgical History:  Procedure Laterality Date  . ABDOMINAL HYSTERECTOMY    . BIOPSY THYROID  08/2008   Atypia and Hurtle cells, partial thyroidectomy   . BLADDER SUSPENSION     mesh sling  . Carotid ultrasound  09/10/09   No significant extracranial carotid artery stenosis, vertebrals are patent with antegrade flow.   . COLONOSCOPY  06/03/2005   Hyperplastic polyps, sigmoid diverticulosis, internal hemorroids  . FOREIGN BODY REMOVAL Right 02/21/2015   Procedure: RIGHT FOOT FOREIGN BODY REMOVAL ;  Surgeon: Marchia Bond, MD;  Location: [facility];  Service: Orthopedics;  Laterality: Right;  . Ganglion removal  12/2005   R wrist subretinacular dorsal ganglion  . MRI Head  09/20/09   No acute intracranial abn.  Signal abnormality suggestive of chronic small vessel ischemia, maximal in the right subinsular white and deep gray mater.   [name] PFT  11/2006   Obstructive pattern.  Poor response to bronchodilators.   . THYROIDECTOMY, PARTIAL  08/2008   Dr Ronnald Collum  .  TONSILLECTOMY AND ADENOIDECTOMY  10/2004  . TRANSTHORACIC ECHOCARDIOGRAM  10/06/1636   Normal systolic function.  EF 55-60%.  Mild MR, atria ok, trivial pericardial effusion  . TVT  06/15/10   Tension-free Vaginal Tape, Dr Mancel Bale, for urge incontinence     FAMILY HISTORY:  Family History  Problem Relation Age of Onset  . Heart disease Mother   . Stomach cancer Mother   . Cirrhosis Sister   . Hypertension Sister   . Heart disease Maternal Grandmother   . Heart disease Maternal Grandfather   . Colon cancer Maternal Aunt   . Other Neg Hx      SOCIAL HISTORY:  reports that she has been smoking cigarettes.  She has a 8.75 pack-year smoking history. she has never used smokeless tobacco. She reports that she does not drink alcohol or use drugs.   ALLERGIES: Seroquel [quetiapine fumarate]; Amoxicillin; Ciprofloxacin; Haloperidol lactate; Ketorolac tromethamine; Latuda [lurasidone hcl]; Thorazine [chlorpromazine]; and Wellbutrin [bupropion]   MEDICATIONS:  Current Outpatient Medications  Medication Sig Dispense Refill  . aspirin 325 MG tablet Take 325 mg by mouth daily.    [name] escitalopram (LEXAPRO) 10 MG tablet Take 20 mg by mouth daily.     [name] levothyroxine (SYNTHROID, LEVOTHROID) 125 MCG tablet Take 1 tablet (125 mcg total) by mouth daily before breakfast. 30 tablet 1  . loperamide (IMODIUM) 2 MG capsule Take by mouth as needed for diarrhea or loose stools (as needed for diarrhea).    . promethazine (PHENERGAN) 25 MG tablet Take one half tablet by mouth every 6-8 hours as needed for nausea or vomiting. 20 tablet 0   Current Facility-Administered Medications  Medication Dose Route Frequency Provider Last Rate Last Dose  . albuterol (PROVENTIL HFA;VENTOLIN HFA) 108 (90 Base) MCG/ACT inhaler 2 puff  2 puff Inhalation Once Verner Mould, MD         REVIEW OF SYSTEMS: On review of systems, the patient reports that she is positive for fatigue affecting her activities, heart pain,  unintentional weight loss, glasses, blurred vision, sinus problems, runny nose, occasional dysphagia, chest pain, shortness of breath with walking and stairs, poor appetite, daily nausea, diarrhea, two lumps in her left breast, a lump on the left side of her forehead from skin cancer, occasional numbness, occasional back pain, joint pain, arthritis, depression, suicidal thoughts, and thyroid problems.  She denies any cough, fevers, chills, or night sweats. She denies any bowel or bladder disturbances, and denies abdominal pain, or vomiting. She denies any new musculoskeletal or joint aches or pains. A complete review of systems is obtained and is otherwise negative.     PHYSICAL EXAM:  Wt Readings from Last 3 Encounters:  03/23/17 89 lb 4.8 oz (40.5 kg)  02/28/17 93 lb (42.2 kg)  12/08/16 87 lb (39.5 kg)   Temp Readings from Last 3 Encounters:  03/23/17 98.1 F (36.7 C) (Oral)  02/28/17 98.2 F (36.8 C) (Oral)  12/08/16 98.2 F (36.8 C) (Oral)   BP Readings from Last 3  Encounters:  03/23/17 (!) 167/99  02/28/17 (!) 148/82  12/08/16 139/86   Pulse Readings from Last 3 Encounters:  03/23/17 81  02/28/17 66  12/08/16 84    /10  In general this is a well appearing woman in no acute distress. She is alert and oriented x4 and appropriate throughout the examination. HEENT reveals that the patient is normocephalic, atraumatic. EOMs are intact. PERRLA. Skin is intact without any evidence of gross lesions. Cardiovascular exam reveals a regular rate and rhythm, no clicks rubs or murmurs are auscultated. Chest is clear to auscultation bilaterally. Lymphatic assessment is performed and does not reveal any adenopathy in the cervical, supraclavicular, axillary, or inguinal chains. Abdomen has active bowel sounds in all quadrants and is intact. The abdomen is soft, non tender, non distended. Lower extremities are negative for pretibial pitting edema, deep calf tenderness, cyanosis or clubbing. Breast  exam revealed skin thickening inferior to the areola, 2 distinct palpable masses at 1 o'clock and 6 o'clock and ecchymosis at the biopsy sites. Otherwise there is no nipple discharge or bleeding.   ECOG = 1  0 - Asymptomatic (Fully active, able to carry on all predisease activities without restriction)  1 - Symptomatic but completely ambulatory (Restricted in physically strenuous activity but ambulatory and able to carry out work of a light or sedentary nature. For example, light housework, office work)  2 - Symptomatic, <50% in bed during the day (Ambulatory and capable of all self care but unable to carry out any work activities. Up and about more than 50% of waking hours)  3 - Symptomatic, >50% in bed, but not bedbound (Capable of only limited self-care, confined to bed or chair 50% or more of waking hours)  4 - Bedbound (Completely disabled. Cannot carry on any self-care. Totally confined to bed or chair)  5 - Death   Eustace Pen MM, Creech RH, Tormey DC, et al. [phone]). "Toxicity and response criteria of the [facility] [employer]". [place] Oncol. 5 (6): 649-55    LABORATORY DATA:  Lab Results  Component Value Date   WBC 6.1 03/23/2017   HGB 13.7 03/23/2017   HCT 41.4 03/23/2017   MCV 97.3 03/23/2017   PLT 286 03/23/2017   Lab Results  Component Value Date   NA 144 03/23/2017   K 3.0 (LL) 03/23/2017   CL 105 12/08/2016   CO2 28 03/23/2017   Lab Results  Component Value Date   ALT 8 03/23/2017   AST 13 03/23/2017   ALKPHOS 97 03/23/2017   BILITOT 0.39 03/23/2017      RADIOGRAPHY: US Breast Ltd Uni Left Inc Axilla  Result Date: 03/03/2017 CLINICAL DATA:  Palpable abnormalities in the left breast. EXAM: 2D DIGITAL DIAGNOSTIC BILATERAL MAMMOGRAM WITH CAD AND ADJUNCT TOMO ULTRASOUND LEFT BREAST COMPARISON:  07/15/2014 and earlier ACR Breast Density Category c: The breast tissue is heterogeneously dense, which may obscure small masses. FINDINGS: There has been  significant weight loss since the prior study. There is asymmetric density in the upper portion of the left breast, corresponding to possible distortion on tomosynthesis images. This areas marked palpable with a BB. No discrete abnormality is identified in the lower portion of the left breast. Right breast is negative. Mammographic images were processed with CAD. On physical exam, I palpate discrete firm mobile mass in the 6 o'clock location of the left breast 2 cm from the nipple. I palpate a discrete mass in the 1 o'clock location left breast 5 cm from the  nipple. Targeted ultrasound is performed, showing an irregular hypoechoic mass with indistinct margins in the 6 o'clock location of the left breast 2 cm from the nipple. Mass is associated increased blood flow on Doppler evaluation. There is associated posterior acoustic shadowing. Mass measures 2.0 x 2.1 x 1.2 cm. In the 1 o'clock location 5 cm from the nipple, there is an irregular parallel mass with indistinct margins. There is neither posterior acoustic enhancement or shadowing. Mass measures 3.5 x 1.0 x 2.3 cm. Evaluation of the axilla is negative for adenopathy. IMPRESSION: 1. Palpable abnormalities in the left breast are suspicious for malignancy and warrant tissue diagnosis. 2. No sonographically detected adenopathy in the left axilla. RECOMMENDATION: Ultrasound-guided core biopsy of mass in the 1 o'clock location left breast. Ultrasound-guided core biopsy of mass in the 6 o'clock location of the left breast. Biopsies have been scheduled for the patient. I have discussed the findings and recommendations with the patient. Results were also provided in writing at the conclusion of the visit. If applicable, a reminder letter will be sent to the patient regarding the next appointment. BI-RADS CATEGORY  4: Suspicious. Electronically Signed   By: Nolon Nations M.D.   On: 03/03/2017 16:42   Mm Diag Breast Tomo Bilateral  Result Date: 03/03/2017 CLINICAL  DATA:  Palpable abnormalities in the left breast. EXAM: 2D DIGITAL DIAGNOSTIC BILATERAL MAMMOGRAM WITH CAD AND ADJUNCT TOMO ULTRASOUND LEFT BREAST COMPARISON:  07/15/2014 and earlier ACR Breast Density Category c: The breast tissue is heterogeneously dense, which may obscure small masses. FINDINGS: There has been significant weight loss since the prior study. There is asymmetric density in the upper portion of the left breast, corresponding to possible distortion on tomosynthesis images. This areas marked palpable with a BB. No discrete abnormality is identified in the lower portion of the left breast. Right breast is negative. Mammographic images were processed with CAD. On physical exam, I palpate discrete firm mobile mass in the 6 o'clock location of the left breast 2 cm from the nipple. I palpate a discrete mass in the 1 o'clock location left breast 5 cm from the nipple. Targeted ultrasound is performed, showing an irregular hypoechoic mass with indistinct margins in the 6 o'clock location of the left breast 2 cm from the nipple. Mass is associated increased blood flow on Doppler evaluation. There is associated posterior acoustic shadowing. Mass measures 2.0 x 2.1 x 1.2 cm. In the 1 o'clock location 5 cm from the nipple, there is an irregular parallel mass with indistinct margins. There is neither posterior acoustic enhancement or shadowing. Mass measures 3.5 x 1.0 x 2.3 cm. Evaluation of the axilla is negative for adenopathy. IMPRESSION: 1. Palpable abnormalities in the left breast are suspicious for malignancy and warrant tissue diagnosis. 2. No sonographically detected adenopathy in the left axilla. RECOMMENDATION: Ultrasound-guided core biopsy of mass in the 1 o'clock location left breast. Ultrasound-guided core biopsy of mass in the 6 o'clock location of the left breast. Biopsies have been scheduled for the patient. I have discussed the findings and recommendations with the patient. Results were also  provided in writing at the conclusion of the visit. If applicable, a reminder letter will be sent to the patient regarding the next appointment. BI-RADS CATEGORY  4: Suspicious. Electronically Signed   By: Nolon Nations M.D.   On: 03/03/2017 16:42   Mm Clip Placement Left  Result Date: 03/04/2017 CLINICAL DATA:  This 71 year old female status post ultrasound-guided biopsy of 2 suspicious left breast masses. EXAM: DIAGNOSTIC  LEFT MAMMOGRAM POST ULTRASOUND BIOPSY COMPARISON:  Previous exam(s). FINDINGS: Mammographic images were obtained following ultrasound guided biopsy of 2 left breast masses. A ribbon shaped clip is identified in the inferior central aspect anterior depth and a coil shaped clip is identified in the upper outer aspect at posterior depth. Both clips are in the expected locations status post biopsy. IMPRESSION: Two post biopsy tissue marking clips in the expected locations status post ultrasound-guided biopsy of 2 left breast masses. Final Assessment: Post Procedure Mammograms for Marker Placement Electronically Signed   By: Kristopher Oppenheim M.D.   On: 03/04/2017 15:44   [place] Lt Breast Bx W Loc Dev 1st Lesion Img Bx Spec US Guide  Addendum Date: 03/10/2017   ADDENDUM REPORT: 03/07/2017 14:40 ADDENDUM: Pathology revealed GRADE II INVASIVE MAMMARY CARCINOMA, MAMMARY CARCINOMA IN SITU, LYMPHOVASCULAR INVASION IS IDENTIFIED, PERINEURAL INVASION IS IDENTIFIED of the Left breast, 6 o'clock, 2 cm fn. GRADE II INVASIVE MAMMARY CARCINOMA, MAMMARY CARCINOMA IN SITU of the Left breast, 1 o'clock, 5 cm fn. This was found to be concordant by Dr. Kristopher Oppenheim. Note is made of diffuse left-sided skin thickening on mammography, consistent with inflammatory breast cancer given the lymphovascular invasion identified pathologically. Pathology results were discussed with the patient by telephone. The patient reported doing well after the biopsies with tenderness at the sites. Post biopsy instructions and care  were reviewed and questions were answered. The patient was encouraged to call [facility] for any additional concerns. The patient was referred to [facility] on March 16, 2017. Pathology results reported by Terie Purser, RN on 03/07/2017. Electronically Signed   By: Kristopher Oppenheim M.D.   On: 03/07/2017 14:40   Result Date: 03/10/2017 CLINICAL DATA:  71 year old female with a 2 suspicious left breast masses. The patient notes that she took approximately 2-3 regular strength aspirin since last night due to "heart issues" which she thinks may be related to anxiety from her breast masses. The decision was made to proceed with biopsies today. She was instructed to contact her primary care physician immediately or go to the ER if her heart issues persist today. EXAM: ULTRASOUND GUIDED LEFT BREAST CORE NEEDLE BIOPSY COMPARISON:  Previous exam(s). FINDINGS: I met with the patient and we discussed the procedure of ultrasound-guided biopsy, including benefits and alternatives. We discussed the high likelihood of a successful procedure. We discussed the risks of the procedure, including infection, bleeding, tissue injury, clip migration, and inadequate sampling. Informed written consent was given. The usual time-out protocol was performed immediately prior to the procedure. Lesion quadrant: Lower outer quadrant Using sterile technique and 1% Lidocaine as local anesthetic, under direct ultrasound visualization, a 14 gauge spring-loaded device was used to perform biopsy of a left breast mass at the 6 o'clock position using a medial approach. At the conclusion of the procedure a ribbon shaped tissue marker clip was deployed into the biopsy cavity. Lesion quadrant: Upper-outer quadrant Using sterile technique and 1% Lidocaine as local anesthetic, under direct ultrasound visualization, a 14 gauge spring-loaded device was  used to perform biopsy of a left breast mass at the 1 o'clock position using a medial approach. At the conclusion of the procedure a coil shaped tissue marker clip was deployed into the biopsy cavity. The patient experienced mild bleeding from the biopsy sites after both procedures. However firm pressure was applied and bleeding subsided at both sites. Follow up 2 view mammogram was performed  and dictated separately. IMPRESSION: Ultrasound guided biopsy of left breast masses x2. No apparent complications. Electronically Signed: By: Kristopher Oppenheim M.D. On: 03/04/2017 15:43   [place] Lt Breast Bx W Loc Dev Ea Add Lesion Img Bx Spec US Guide  Addendum Date: 03/10/2017   ADDENDUM REPORT: 03/07/2017 14:40 ADDENDUM: Pathology revealed GRADE II INVASIVE MAMMARY CARCINOMA, MAMMARY CARCINOMA IN SITU, LYMPHOVASCULAR INVASION IS IDENTIFIED, PERINEURAL INVASION IS IDENTIFIED of the Left breast, 6 o'clock, 2 cm fn. GRADE II INVASIVE MAMMARY CARCINOMA, MAMMARY CARCINOMA IN SITU of the Left breast, 1 o'clock, 5 cm fn. This was found to be concordant by Dr. Kristopher Oppenheim. Note is made of diffuse left-sided skin thickening on mammography, consistent with inflammatory breast cancer given the lymphovascular invasion identified pathologically. Pathology results were discussed with the patient by telephone. The patient reported doing well after the biopsies with tenderness at the sites. Post biopsy instructions and care were reviewed and questions were answered. The patient was encouraged to call [facility] for any additional concerns. The patient was referred to [facility] at [facility] on March 16, 2017. Pathology results reported by Terie Purser, RN on 03/07/2017. Electronically Signed   By: Kristopher Oppenheim M.D.   On: 03/07/2017 14:40   Result Date: 03/10/2017 CLINICAL DATA:  71 year old female with a 2 suspicious left breast masses. The  patient notes that she took approximately 2-3 regular strength aspirin since last night due to "heart issues" which she thinks may be related to anxiety from her breast masses. The decision was made to proceed with biopsies today. She was instructed to contact her primary care physician immediately or go to the ER if her heart issues persist today. EXAM: ULTRASOUND GUIDED LEFT BREAST CORE NEEDLE BIOPSY COMPARISON:  Previous exam(s). FINDINGS: I met with the patient and we discussed the procedure of ultrasound-guided biopsy, including benefits and alternatives. We discussed the high likelihood of a successful procedure. We discussed the risks of the procedure, including infection, bleeding, tissue injury, clip migration, and inadequate sampling. Informed written consent was given. The usual time-out protocol was performed immediately prior to the procedure. Lesion quadrant: Lower outer quadrant Using sterile technique and 1% Lidocaine as local anesthetic, under direct ultrasound visualization, a 14 gauge spring-loaded device was used to perform biopsy of a left breast mass at the 6 o'clock position using a medial approach. At the conclusion of the procedure a ribbon shaped tissue marker clip was deployed into the biopsy cavity. Lesion quadrant: Upper-outer quadrant Using sterile technique and 1% Lidocaine as local anesthetic, under direct ultrasound visualization, a 14 gauge spring-loaded device was used to perform biopsy of a left breast mass at the 1 o'clock position using a medial approach. At the conclusion of the procedure a coil shaped tissue marker clip was deployed into the biopsy cavity. The patient experienced mild bleeding from the biopsy sites after both procedures. However firm pressure was applied and bleeding subsided at both sites. Follow up 2 view mammogram was performed and dictated separately. IMPRESSION: Ultrasound guided biopsy of left breast masses x2. No apparent complications. Electronically  Signed: By: Kristopher Oppenheim M.D. On: 03/04/2017 15:43       IMPRESSION/PLAN: 1. 71 y/o female with Stage II, cT2N0, grade 2, ER+ PR- Her2- invasive ductal carcinoma of the left breast.  Dr. Lisbeth Renshaw discusses the pathology findings and reviews the nature of invasive disease. The consensus from the breast conference include left breast mastectomy with  sentinel mapping.  Radiation therapy is not felt to be warranted at this time. The patient will be scheduled for left mastectomy with sentinel lymph node biopsy and Oncotype testing to determine if there is a role for chemotherapy. This will be followed with an aromatase inhibitor.   The above documentation reflects my direct findings during this shared patient visit. Please see separate note by Dr. Lisbeth Renshaw on this date for the remainder of the patient's plan of care.    Nicholos Johns, PA-C  This document serves as a record of services personally performed by Kyung Rudd, MD and Freeman Caldron, PA-C. It was created on their behalf by Bethann Humble, a trained medical scribe. The creation of this record is based on the scribe's personal observations and the provider's statements to them. This document has been checked and approved by the attending provider.

## 2017-03-23 NOTE — Progress Notes (Unsigned)
Nutrition Assessment  Reason for Assessment:  Pt seen in Breast Clinic  ASSESSMENT:   71 year old female with new diagnosis of breast cancer.  Past medical history of bipolar, depression, CVA, COPD, GERD, fatigue, HTN.  Patient reports poor appetite due to depression, currently being treated. Reports has tried megace in the past.  Medications:  reviewed  Labs: reviewed  Anthropometrics:   Height: 4'7.5 inches Weight: 93 lb BMI: 21  10-15 pound weight loss over the last 1.5-2 years   NUTRITION DIAGNOSIS: Food and nutrition related knowledge deficit related to new diagnosis of breast cancer as evidenced by no prior need for nutrition related information.  INTERVENTION:   Discussed and provided packet of information regarding nutritional tips for breast cancer patients. Encouraged use of oral nutrition supplements to increase calories and protein.  Encouraged continued treatment of depression. Questions answered.  Teachback method used.  Contact information provided and patient knows to contact me with questions/concerns.    MONITORING, EVALUATION, and GOAL: Pt will consume a healthy plant based diet to maintain lean body mass throughout treatment.   Brendan Gruwell B. Zenia Resides, [place], [place] Registered Dietitian [phone] (pager)
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
## 2017-03-23 NOTE — Assessment & Plan Note (Addendum)
03/04/2017: Palpable left breast masses with skin thickening, by ultrasound 2 masses were noted at 5:00 2.1 cm and at 1:00 3.5 cm, biopsy of both of these lesions grade 2 invasive ductal carcinoma with lymphovascular invasion and perineural invasion, ER 100%, PR 0%, HER-2 negative ratio 1.03, Ki-67 5%, T2 N0 stage II a clinical stage  Pathology and radiology counseling: Discussed with the patient, the details of pathology including the type of breast cancer,the clinical staging, the significance of ER, PR and HER-2/neu receptors and the implications for treatment. After reviewing the pathology in detail, we proceeded to discuss the different treatment options between surgery, radiation, chemotherapy, antiestrogen therapies.  Recommendation: 1.  Left mastectomy with sentinel lymph node biopsy 2. ideally we would need to perform Oncotype DX testing however because the patient is extremely frail and has very poor performance status, she may not tolerate systemic chemotherapy.  Because of this we decided that she will not undergo Oncotype testing. 3.  Adjuvant antiestrogen therapy with letrozole 2.5 mg daily times 5-7 years.  Profound weight loss: Related to major depression Patient has been depressed since her husband passed away and most recently her dog passed away.  She does not go out of the house she does not eat food and her nearest family members are more than 2 hours away and cannot supervise her all the time.  Return to clinic after surgery to discuss the final pathology report.

## 2017-03-24 ENCOUNTER — Encounter: Payer: Self-pay | Admitting: Radiation Oncology

## 2017-03-25 ENCOUNTER — Telehealth: Payer: Self-pay | Admitting: Family Medicine

## 2017-03-25 ENCOUNTER — Other Ambulatory Visit: Payer: Self-pay | Admitting: Hematology and Oncology

## 2017-03-25 ENCOUNTER — Telehealth: Payer: Self-pay | Admitting: Oncology

## 2017-03-25 DIAGNOSIS — E2839 Other primary ovarian failure: Secondary | ICD-10-CM

## 2017-03-25 NOTE — Telephone Encounter (Signed)
Pt called and wanted to let Dr. Ardelia Mems and her PCP know she went to her appt at the breast center this past Tuesday or Wednesday and they told her she has breast cancer and they will be removing her left breast. She just wanted to notify the dr.

## 2017-03-25 NOTE — Telephone Encounter (Signed)
Left message for patient regarding date/time/location/phone # for appt.

## 2017-03-28 ENCOUNTER — Telehealth: Payer: Self-pay | Admitting: Hematology and Oncology

## 2017-03-28 NOTE — Telephone Encounter (Signed)
No 12/19 los. °

## 2017-03-30 ENCOUNTER — Encounter: Payer: Self-pay | Admitting: General Practice

## 2017-03-30 ENCOUNTER — Telehealth: Payer: Self-pay | Admitting: *Deleted

## 2017-03-30 NOTE — Telephone Encounter (Signed)
Left vm regarding [facility] from 03/23/17. Request return call. Contact information provided.

## 2017-03-30 NOTE — Progress Notes (Signed)
[facility] [place] Psychosocial Distress Screening Spiritual Care  LVM for [name] following Breast Multidisciplinary Clinic to introduce [place] team/resources, reviewing distress screen per protocol.  The patient scored a [unspecified] on the Psychosocial Distress Thermometer which indicates [unspecified] distress.   ONCBCN DISTRESS SCREENING 03/30/2017  Screening Type Initial Screening  Distress experienced in past week (1-10) (No Data)  Practical problem type Food  Emotional problem type Depression;Nervousness/Anxiety;Adjusting to illness;Isolation/feeling alone;Feeling hopeless;Boredom;Adjusting to appearance changes  Spiritual/Religous concerns type Facing my mortality;Loss of sense of purpose  Information Concerns Type Lack of info about diagnosis;Lack of info about treatment;Lack of info about complementary therapy choices  Physical Problem type Sleep/insomnia;Getting around;Breathing;Mouth sores/swallowing;Loss of appetitie;Constipation/diarrhea;Skin dry/itchy  Referral to support programs Yes    Follow up needed: Yes.  Unable to reach pt by phone, so LVM encouraging return call. Will continue to try by phone if I don't receive a call back.   [place], [place], [facility] Pager [phone] Voicemail [phone]

## 2017-03-31 ENCOUNTER — Telehealth: Payer: Self-pay | Admitting: *Deleted

## 2017-03-31 ENCOUNTER — Encounter: Payer: Self-pay | Admitting: General Practice

## 2017-03-31 NOTE — Telephone Encounter (Signed)
Spoke to pt regarding [place] from 03/23/17. Denies questions or concerns regarding dx or treatment care plan. Encourage pt to call with needs. Received verbal understanding.

## 2017-03-31 NOTE — Progress Notes (Signed)
[facility] Spiritual Care Note  Returned [name]'s voicemails, having a detailed, >30 min conversation. Per pt, she is struggling with chronic fatigue syndrome: "I feel like I'm dying every day...and only sleep and tv [distract from] my negative thoughts." She states that she wishes she would die in her sleep to avoid a painful death, but that she would not consider suicide/self harm because she does not want to add to her suffering or to cause pain to her daughter, son, sisters, or grandchildren. Per pt, she sees a provider at [facility] for [employer] and plans to call today to schedule appt for January. She also plans to phone breast navigator [name]/RN re two lumps she found in her chest above her L breast and questions about scheduling.  [name]'s family relationships, especially monthly visits from her daughter who helps with paying bills and running errands, are a source of meaning and motivation for her. 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  [place] and plans to phone when she would like to talk and share more poetry. She knows I will be out of office next week. Plan to phone her thereafter for support and encouragement.   [name], [place], [facility] Pager [phone] Voicemail [phone]

## 2017-04-07 ENCOUNTER — Telehealth: Payer: Self-pay | Admitting: *Deleted

## 2017-04-07 ENCOUNTER — Ambulatory Visit
Admission: RE | Admit: 2017-04-07 | Discharge: 2017-04-07 | Disposition: A | Discharge status: Home / Self Care | Payer: Medicare Other | Source: Ambulatory Visit | Attending: Hematology and Oncology | Admitting: Hematology and Oncology

## 2017-04-07 DIAGNOSIS — M810 Age-related osteoporosis without current pathological fracture: Secondary | ICD-10-CM | POA: Diagnosis not present

## 2017-04-07 DIAGNOSIS — Z780 Asymptomatic menopausal state: Secondary | ICD-10-CM | POA: Diagnosis not present

## 2017-04-07 DIAGNOSIS — E2839 Other primary ovarian failure: Secondary | ICD-10-CM

## 2017-04-07 MED ORDER — LETROZOLE 2.5 MG PO TABS: 2.5000 mg | ORAL_TABLET | Freq: Every day | ORAL | 6 refills | Status: DC

## 2017-04-07 MED ORDER — TAMOXIFEN CITRATE 20 MG PO TABS: 20.0000 mg | ORAL_TABLET | Freq: Every day | ORAL | 6 refills | Status: DC

## 2017-04-07 NOTE — Telephone Encounter (Signed)
Pt called requesting sx date be in Feb d/t daughter going on a mission trip this month. Physician team notified. Per Dr. Lindi Adie pt to start on Letrozole. Called pt with instructions and sent prescription to her pharmacy. Denies further questions or needs at this time.

## 2017-04-07 NOTE — Telephone Encounter (Signed)
Due to pt bone density score Dr Lindi Adie change prescription to tamoxifen. Called pt and discussed change in medication.  At time of bone density, pt c/o 2 small "lumps" to left breast. D/t pt having left mastectomy, additional biopsy is not recommended.

## 2017-04-11 ENCOUNTER — Encounter: Payer: Self-pay | Admitting: General Practice

## 2017-04-11 NOTE — Progress Notes (Signed)
[place] Spiritual Care Note  LVM in response to pt's vm today. Will try her again tomorrow if I don't hear back.   [place], [place], [facility] Pager [phone] Voicemail [phone]

## 2017-04-12 ENCOUNTER — Encounter: Payer: Self-pay | Admitting: General Practice

## 2017-04-12 NOTE — Progress Notes (Signed)
[place] Spiritual Care Note  Reached [name] by phone. She had resolved her initial question (re surgery, which is beyond my scope). She knows to call breast navigator [name] with medical questions, [name] for dietary, [name] for psych, and me for emotional/spiritual support related to dx/social isolation. We specifically reviewed details that she plans to communicate to [facility] (new ca dx and tx plan, fatigue, low mood, etc) to maximize how much her providers there are able to help her through this process.   [name] plans to f/u with Spiritual Care by phone as desired, but please also page if immediate needs arise.   [place], [place], [facility] Pager [phone] Voicemail [phone]

## 2017-04-13 ENCOUNTER — Other Ambulatory Visit: Payer: Medicare Other

## 2017-04-13 ENCOUNTER — Ambulatory Visit: Payer: Medicare Other | Admitting: Oncology

## 2017-04-13 ENCOUNTER — Telehealth: Payer: Self-pay | Admitting: Hematology and Oncology

## 2017-04-13 NOTE — Telephone Encounter (Signed)
Left message on voicemail regarding appointment per 1/7 scheduling message from Dr Lindi Adie

## 2017-04-14 ENCOUNTER — Other Ambulatory Visit: Payer: Medicare Other

## 2017-05-06 ENCOUNTER — Other Ambulatory Visit: Payer: Self-pay | Admitting: Family Medicine

## 2017-05-06 MED ORDER — PROMETHAZINE HCL 25 MG PO TABS: ORAL_TABLET | ORAL | 0 refills | Status: DC

## 2017-05-06 NOTE — Pre-Procedure Instructions (Signed)
[name]  05/06/2017      [id number] [place], [place] [address] [place] [address] Phone: [phone] Fax: [phone]    Your procedure is scheduled on Friday, May 13, 2017  Report to [facility] Admitting Entrance "A" at 5:30AM  Call this number if you have problems the morning of surgery:  [phone]   Remember:  Do not eat food or drink liquids after midnight.  Take these medicines the morning of surgery with A SIP OF WATER: Escitalopram (LEXAPRO), Levothyroxine (SYNTHROID, LEVOTHROID), andTamoxifen (NOLVADEX). If needed Promethazine (PHENERGAN) for nausea  Follow your doctor's instruction regarding Aspirin.  As of today, stop taking all Aspirins, Vitamins, Fish oils, and Herbal medications. Also stop all NSAIDS i.e. Advil, Ibuprofen, Motrin, Aleve, Anaprox, Naproxen, BC and Goody Powders.  Please complete your PRE-SURGERY ENSURE that was given to before you leave your house the morning of surgery.  Please, if able, drink it in one setting. DO NOT SIP.   Do not wear jewelry, make-up or nail polish.  Do not wear lotions, powders, perfumes, or deodorant.  Do not shave 48 hours prior to surgery.    Do not bring valuables to the hospital.  [facility] is not responsible for any belongings or valuables.  Contacts, dentures or bridgework may not be worn into surgery.  Leave your suitcase in the car.  After surgery it may be brought to your room.  For patients admitted to the hospital, discharge time will be determined by your treatment team.  Patients discharged the day of surgery will not be allowed to drive home.   Special instructions:   [place]- Preparing For Surgery  Before surgery, you can play an important role. Because skin is not sterile, your skin needs to be as free of germs as possible. You can reduce the number of germs on your skin by washing with CHG  (chlorahexidine gluconate) Soap before surgery.  CHG is an antiseptic cleaner which kills germs and bonds with the skin to continue killing germs even after washing.  Please do not use if you have an allergy to CHG or antibacterial soaps. If your skin becomes reddened/irritated stop using the CHG.  Do not shave (including legs and underarms) for at least 48 hours prior to first CHG shower. It is OK to shave your face.  Please follow these instructions carefully.   1. Shower the NIGHT BEFORE SURGERY and the MORNING OF SURGERY with CHG.   2. If you chose to wash your hair, wash your hair first as usual with your normal shampoo.  3. After you shampoo, rinse your hair and body thoroughly to remove the shampoo.  4. Use CHG as you would any other liquid soap. You can apply CHG directly to the skin and wash gently with a scrungie or a clean washcloth.   5. Apply the CHG Soap to your body ONLY FROM THE NECK DOWN.  Do not use on open wounds or open sores. Avoid contact with your eyes, ears, mouth and genitals (private parts). Wash Face and genitals (private parts)  with your normal soap.  6. Wash thoroughly, paying special attention to the area where your surgery will be performed.  7. Thoroughly rinse your body with warm water from the neck down.  8. DO NOT shower/wash with your normal soap after using and rinsing off the CHG Soap.  9. Pat yourself dry with  a CLEAN TOWEL.  10. Wear CLEAN PAJAMAS to bed the night before surgery, wear comfortable clothes the morning of surgery  11. Place CLEAN SHEETS on your bed the night of your first shower and DO NOT SLEEP WITH PETS.  Day of Surgery: Do not apply any deodorants/lotions. Please wear clean clothes to the hospital/surgery center.    Please read over the following fact sheets that you were given. Pain Booklet, Coughing and Deep Breathing and Surgical Site Infection Prevention

## 2017-05-06 NOTE — Telephone Encounter (Signed)
Pt [facility] [address]  She is having masectomy next Friday.

## 2017-05-09 ENCOUNTER — Telehealth: Payer: Self-pay

## 2017-05-09 ENCOUNTER — Encounter: Payer: Self-pay | Admitting: General Practice

## 2017-05-09 ENCOUNTER — Other Ambulatory Visit: Payer: Self-pay

## 2017-05-09 ENCOUNTER — Encounter (HOSPITAL_COMMUNITY): Payer: Self-pay

## 2017-05-09 ENCOUNTER — Encounter (HOSPITAL_COMMUNITY)
Admission: RE | Admit: 2017-05-09 | Discharge: 2017-05-09 | Disposition: A | Discharge status: Home / Self Care | Payer: Medicare Other | Source: Ambulatory Visit | Attending: General Surgery | Admitting: General Surgery

## 2017-05-09 DIAGNOSIS — Z01812 Encounter for preprocedural laboratory examination: Secondary | ICD-10-CM | POA: Insufficient documentation

## 2017-05-09 HISTORY — DX: Unspecified osteoarthritis, unspecified site: M19.90

## 2017-05-09 HISTORY — DX: Hypothyroidism, unspecified: E03.9

## 2017-05-09 HISTORY — DX: Malignant (primary) neoplasm, unspecified: C80.1

## 2017-05-09 LAB — BASIC METABOLIC PANEL
Anion gap: 14 (ref 5–15)
BUN: 10 mg/dL (ref 6–20)
CO2: 23 mmol/L (ref 22–32)
Calcium: 8.8 mg/dL — ABNORMAL LOW (ref 8.9–10.3)
Chloride: 102 mmol/L (ref 101–111)
Creatinine, Ser: 0.89 mg/dL (ref 0.44–1.00)
GFR calc Af Amer: >60 mL/min (ref >60)
GFR calc non Af Amer: >60 mL/min (ref >60)
Glucose, Bld: 119 mg/dL — ABNORMAL HIGH (ref 65–99)
Potassium: 3 mmol/L — ABNORMAL LOW (ref 3.5–5.1)
Sodium: 139 mmol/L (ref 135–145)

## 2017-05-09 LAB — CBC
HCT: 39.5 % (ref 36.0–46.0)
Hemoglobin: 13.1 g/dL (ref 12.0–15.0)
MCH: 32.4 pg (ref 26.0–34.0)
MCHC: 33.2 g/dL (ref 30.0–36.0)
MCV: 97.8 fL (ref 78.0–100.0)
Platelets: 236 10*3/uL (ref 150–400)
RBC: 4.04 MIL/uL (ref 3.87–5.11)
RDW: 13.2 % (ref 11.5–15.5)
WBC: 4.8 10*3/uL (ref 4.0–10.5)

## 2017-05-09 MED ORDER — ESCITALOPRAM OXALATE 10 MG PO TABS: 20.0000 mg | ORAL_TABLET | Freq: Every day | ORAL | 0 refills | Status: DC

## 2017-05-09 NOTE — Telephone Encounter (Signed)
Pt calling to report that she had been feeling very fatigue and weak. She states that she is barely able to move around and go anywhere due to her cancer. Pt has been feeling depressed and is running low on her lexapro. Pt is unable to go to [facility] and pick up her prescription for lexapro, and is asking Dr.Gudena if a refill could be sent to her pharmacy today. Pt takes 20mg  daily. Notified Dr.Gudena and okay to do 1 courtesy refill for lexapro.   Pt states that she is going to be going for her surgery (L mastectomy) on Friday, and will see Dr.Gudena on 2/18. Pt would like to change her appt time to an earlier time if possible. Rescheduled for an earlier time and confirmed new time/date.   Will call [facility] to notify them of 1 month courtesy refill and to call pt if there are any more updates with this.

## 2017-05-09 NOTE — Progress Notes (Signed)
[place] Spiritual Care Note  Returned VM from [place]. Directed her Lexapro dosage question to Dr Lindi Adie and breast navigator Dawn Stuart/RN via inbasket, requesting clarification with pt.  (Per pt, she expected rx for Lexapro 20mg , but realized after getting home from pharmacy that rx was written for 10mg .)   Chaplain Shelva Majestic, [facility] Pager [phone] Voicemail [phone]

## 2017-05-12 ENCOUNTER — Encounter (HOSPITAL_COMMUNITY): Payer: Self-pay | Admitting: Anesthesiology

## 2017-05-12 NOTE — Anesthesia Preprocedure Evaluation (Addendum)
Anesthesia Evaluation  Patient identified by MRN, date of birth, ID band Patient awake    Reviewed: Allergy & Precautions, NPO status , Patient's Chart, lab work & pertinent test results  Airway Mallampati: I  TM Distance: >3 FB Neck ROM: Full    Dental  (+) Poor Dentition, Dental Advisory Given   Pulmonary COPD, former smoker,     + decreased breath sounds      Cardiovascular hypertension, + Peripheral Vascular Disease   Rhythm:Regular Rate:Normal     Neuro/Psych PSYCHIATRIC DISORDERS Anxiety Depression Bipolar Disorder CVA    GI/Hepatic PUD, GERD  ,  Endo/Other  Hypothyroidism   Renal/GU      Musculoskeletal   Abdominal Normal abdominal exam  (+)   Peds  Hematology   Anesthesia Other Findings   Reproductive/Obstetrics                            Lab Results  Component Value Date   WBC 4.8 05/09/2017   HGB 13.1 05/09/2017   HCT 39.5 05/09/2017   MCV 97.8 05/09/2017   PLT 236 05/09/2017   Lab Results  Component Value Date   CREATININE 0.89 05/09/2017   BUN 10 05/09/2017   NA 139 05/09/2017   K 3.0 (L) 05/09/2017   CL 102 05/09/2017   CO2 23 05/09/2017   Lab Results  Component Value Date   INR 0.87 10/19/2009   INR 0.93 10/19/2009   EKG: normal sinus rhythm.   Anesthesia Physical Anesthesia Plan  ASA: III  Anesthesia Plan: General   Post-op Pain Management: GA combined w/ Regional for post-op pain   Induction: Intravenous  PONV Risk Score and Plan: 4 or greater and Ondansetron, Dexamethasone and Treatment may vary due to age or medical condition  Airway Management Planned: Oral ETT  Additional Equipment: None  Intra-op Plan:   Post-operative Plan: Extubation in OR  Informed Consent: I have reviewed the patients History and Physical, chart, labs and discussed the procedure including the risks, benefits and alternatives for the proposed anesthesia with the  patient or authorized representative who has indicated his/her understanding and acceptance.   Dental advisory given  Plan Discussed with: CRNA  Anesthesia Plan Comments:        Anesthesia Quick Evaluation

## 2017-05-13 ENCOUNTER — Encounter (HOSPITAL_COMMUNITY): Payer: Self-pay | Admitting: Certified Registered"

## 2017-05-13 ENCOUNTER — Observation Stay (HOSPITAL_COMMUNITY)
Admission: RE | Admit: 2017-05-13 | Discharge: 2017-05-14 | Disposition: A | Discharge status: Home / Self Care | Payer: Medicare Other | Source: Ambulatory Visit | Attending: General Surgery | Admitting: General Surgery

## 2017-05-13 ENCOUNTER — Encounter (HOSPITAL_COMMUNITY)
Admission: RE | Admit: 2017-05-13 | Discharge: 2017-05-13 | Disposition: A | Discharge status: Home / Self Care | Payer: Medicare Other | Source: Ambulatory Visit | Attending: General Surgery | Admitting: General Surgery

## 2017-05-13 ENCOUNTER — Encounter (HOSPITAL_COMMUNITY)
Admission: RE | Disposition: A | Discharge status: Home / Self Care | Payer: Self-pay | Source: Ambulatory Visit | Attending: General Surgery

## 2017-05-13 ENCOUNTER — Ambulatory Visit (HOSPITAL_COMMUNITY): Payer: Medicare Other | Admitting: Anesthesiology

## 2017-05-13 ENCOUNTER — Other Ambulatory Visit: Payer: Self-pay

## 2017-05-13 ENCOUNTER — Ambulatory Visit (HOSPITAL_COMMUNITY): Payer: Medicare Other | Admitting: Emergency Medicine

## 2017-05-13 DIAGNOSIS — Z7982 Long term (current) use of aspirin: Secondary | ICD-10-CM | POA: Insufficient documentation

## 2017-05-13 DIAGNOSIS — Z8711 Personal history of peptic ulcer disease: Secondary | ICD-10-CM | POA: Diagnosis not present

## 2017-05-13 DIAGNOSIS — K219 Gastro-esophageal reflux disease without esophagitis: Secondary | ICD-10-CM | POA: Diagnosis not present

## 2017-05-13 DIAGNOSIS — M1990 Unspecified osteoarthritis, unspecified site: Secondary | ICD-10-CM | POA: Diagnosis not present

## 2017-05-13 DIAGNOSIS — C773 Secondary and unspecified malignant neoplasm of axilla and upper limb lymph nodes: Secondary | ICD-10-CM | POA: Insufficient documentation

## 2017-05-13 DIAGNOSIS — Z170 Estrogen receptor positive status [ER+]: Principal | ICD-10-CM

## 2017-05-13 DIAGNOSIS — C50812 Malignant neoplasm of overlapping sites of left female breast: Principal | ICD-10-CM | POA: Insufficient documentation

## 2017-05-13 DIAGNOSIS — Z888 Allergy status to other drugs, medicaments and biological substances status: Secondary | ICD-10-CM | POA: Insufficient documentation

## 2017-05-13 DIAGNOSIS — E039 Hypothyroidism, unspecified: Secondary | ICD-10-CM | POA: Insufficient documentation

## 2017-05-13 DIAGNOSIS — C50912 Malignant neoplasm of unspecified site of left female breast: Secondary | ICD-10-CM | POA: Diagnosis not present

## 2017-05-13 DIAGNOSIS — J449 Chronic obstructive pulmonary disease, unspecified: Secondary | ICD-10-CM | POA: Diagnosis not present

## 2017-05-13 DIAGNOSIS — Z886 Allergy status to analgesic agent status: Secondary | ICD-10-CM | POA: Diagnosis not present

## 2017-05-13 DIAGNOSIS — I10 Essential (primary) hypertension: Secondary | ICD-10-CM | POA: Diagnosis not present

## 2017-05-13 DIAGNOSIS — F319 Bipolar disorder, unspecified: Secondary | ICD-10-CM | POA: Insufficient documentation

## 2017-05-13 DIAGNOSIS — Z87891 Personal history of nicotine dependence: Secondary | ICD-10-CM | POA: Insufficient documentation

## 2017-05-13 DIAGNOSIS — F419 Anxiety disorder, unspecified: Secondary | ICD-10-CM | POA: Insufficient documentation

## 2017-05-13 DIAGNOSIS — Z79899 Other long term (current) drug therapy: Secondary | ICD-10-CM | POA: Insufficient documentation

## 2017-05-13 DIAGNOSIS — G8918 Other acute postprocedural pain: Secondary | ICD-10-CM | POA: Diagnosis not present

## 2017-05-13 DIAGNOSIS — Z881 Allergy status to other antibiotic agents status: Secondary | ICD-10-CM | POA: Diagnosis not present

## 2017-05-13 DIAGNOSIS — Z880 Allergy status to penicillin: Secondary | ICD-10-CM | POA: Insufficient documentation

## 2017-05-13 DIAGNOSIS — I739 Peripheral vascular disease, unspecified: Secondary | ICD-10-CM | POA: Diagnosis not present

## 2017-05-13 HISTORY — PX: MASTECTOMY COMPLETE / SIMPLE W/ SENTINEL NODE BIOPSY: SUR846

## 2017-05-13 HISTORY — PX: MASTECTOMY W/ SENTINEL NODE BIOPSY: SHX2001

## 2017-05-13 SURGERY — MASTECTOMY WITH SENTINEL LYMPH NODE BIOPSY
Anesthesia: Regional | Site: Breast | Laterality: Left

## 2017-05-13 MED ORDER — OXYCODONE HCL 5 MG PO TABS: 5.0000 mg | ORAL_TABLET | ORAL | Status: DC | PRN

## 2017-05-13 MED ORDER — CHLORHEXIDINE GLUCONATE CLOTH 2 % EX PADS: 6.0000 | MEDICATED_PAD | Freq: Once | CUTANEOUS | Status: DC

## 2017-05-13 MED ORDER — ALBUTEROL SULFATE HFA 108 (90 BASE) MCG/ACT IN AERS
INHALATION_SPRAY | RESPIRATORY_TRACT | Status: DC | PRN
  Administered 2017-05-13: 8 via RESPIRATORY_TRACT
  Administered 2017-05-13: 4 via RESPIRATORY_TRACT

## 2017-05-13 MED ORDER — POTASSIUM CHLORIDE 2 MEQ/ML IV SOLN
INTRAVENOUS | Status: DC
  Administered 2017-05-13: 13:00:00 via INTRAVENOUS
  Filled 2017-05-13 (×2): qty 1000

## 2017-05-13 MED ORDER — MORPHINE SULFATE (PF) 4 MG/ML IV SOLN: 2.0000 mg | INTRAVENOUS | Status: DC | PRN

## 2017-05-13 MED ORDER — DEXAMETHASONE SODIUM PHOSPHATE 4 MG/ML IJ SOLN
INTRAMUSCULAR | Status: DC | PRN
  Administered 2017-05-13: 8 mg via INTRAVENOUS

## 2017-05-13 MED ORDER — ENOXAPARIN SODIUM 40 MG/0.4ML ~~LOC~~ SOLN
40.0000 mg | SUBCUTANEOUS | Status: DC
  Administered 2017-05-14: 40 mg via SUBCUTANEOUS
  Filled 2017-05-13: qty 0.4

## 2017-05-13 MED ORDER — PHENYLEPHRINE 40 MCG/ML (10ML) SYRINGE FOR IV PUSH (FOR BLOOD PRESSURE SUPPORT)
PREFILLED_SYRINGE | INTRAVENOUS | Status: DC | PRN
  Administered 2017-05-13: 120 ug via INTRAVENOUS
  Administered 2017-05-13: 80 ug via INTRAVENOUS
  Administered 2017-05-13: 120 ug via INTRAVENOUS

## 2017-05-13 MED ORDER — TAMOXIFEN CITRATE 10 MG PO TABS
20.0000 mg | ORAL_TABLET | Freq: Every day | ORAL | Status: DC
  Administered 2017-05-13: 20 mg via ORAL
  Filled 2017-05-13 (×2): qty 2

## 2017-05-13 MED ORDER — LIDOCAINE 2% (20 MG/ML) 5 ML SYRINGE
INTRAMUSCULAR | Status: DC | PRN
  Administered 2017-05-13: 50 mg via INTRAVENOUS

## 2017-05-13 MED ORDER — SUGAMMADEX SODIUM 200 MG/2ML IV SOLN
INTRAVENOUS | Status: AC
  Filled 2017-05-13: qty 2

## 2017-05-13 MED ORDER — PHENYLEPHRINE HCL 10 MG/ML IJ SOLN
INTRAMUSCULAR | Status: DC | PRN
  Administered 2017-05-13: 50 ug/min via INTRAVENOUS

## 2017-05-13 MED ORDER — EPHEDRINE 5 MG/ML INJ
INTRAVENOUS | Status: AC
  Filled 2017-05-13: qty 10

## 2017-05-13 MED ORDER — LOPERAMIDE HCL 2 MG PO CAPS: 2.0000 mg | ORAL_CAPSULE | ORAL | Status: DC | PRN

## 2017-05-13 MED ORDER — ACETAMINOPHEN 500 MG PO TABS
ORAL_TABLET | ORAL | Status: AC
  Filled 2017-05-13: qty 2

## 2017-05-13 MED ORDER — METHYLENE BLUE 0.5 % INJ SOLN
INTRAVENOUS | Status: AC
  Filled 2017-05-13: qty 10

## 2017-05-13 MED ORDER — ESCITALOPRAM OXALATE 20 MG PO TABS
20.0000 mg | ORAL_TABLET | Freq: Every day | ORAL | Status: DC
  Administered 2017-05-13: 20 mg via ORAL
  Filled 2017-05-13 (×2): qty 1

## 2017-05-13 MED ORDER — TRAMADOL HCL 50 MG PO TABS
50.0000 mg | ORAL_TABLET | Freq: Four times a day (QID) | ORAL | Status: DC | PRN
  Administered 2017-05-14: 25 mg via ORAL
  Filled 2017-05-13: qty 1

## 2017-05-13 MED ORDER — SODIUM CHLORIDE 0.9 % IJ SOLN
INTRAMUSCULAR | Status: AC
  Filled 2017-05-13: qty 10

## 2017-05-13 MED ORDER — PROMETHAZINE HCL 25 MG/ML IJ SOLN: 6.2500 mg | INTRAMUSCULAR | Status: DC | PRN

## 2017-05-13 MED ORDER — GABAPENTIN 300 MG PO CAPS
300.0000 mg | ORAL_CAPSULE | ORAL | Status: AC
  Administered 2017-05-13: 300 mg via ORAL

## 2017-05-13 MED ORDER — CEFAZOLIN SODIUM-DEXTROSE 2-4 GM/100ML-% IV SOLN
2.0000 g | INTRAVENOUS | Status: AC
  Administered 2017-05-13: 2 g via INTRAVENOUS

## 2017-05-13 MED ORDER — PROPOFOL 10 MG/ML IV BOLUS
INTRAVENOUS | Status: AC
  Filled 2017-05-13: qty 20

## 2017-05-13 MED ORDER — SUGAMMADEX SODIUM 200 MG/2ML IV SOLN
INTRAVENOUS | Status: DC | PRN
  Administered 2017-05-13: 100 mg via INTRAVENOUS

## 2017-05-13 MED ORDER — GABAPENTIN 300 MG PO CAPS
ORAL_CAPSULE | ORAL | Status: AC
  Filled 2017-05-13: qty 1

## 2017-05-13 MED ORDER — FENTANYL CITRATE (PF) 250 MCG/5ML IJ SOLN
INTRAMUSCULAR | Status: AC
  Filled 2017-05-13: qty 5

## 2017-05-13 MED ORDER — FENTANYL CITRATE (PF) 100 MCG/2ML IJ SOLN
INTRAMUSCULAR | Status: DC | PRN
  Administered 2017-05-13 (×2): 50 ug via INTRAVENOUS
  Administered 2017-05-13 (×2): 25 ug via INTRAVENOUS

## 2017-05-13 MED ORDER — DEXAMETHASONE SODIUM PHOSPHATE 10 MG/ML IJ SOLN
INTRAMUSCULAR | Status: AC
  Filled 2017-05-13: qty 1

## 2017-05-13 MED ORDER — ONDANSETRON HCL 4 MG/2ML IJ SOLN
INTRAMUSCULAR | Status: DC | PRN
  Administered 2017-05-13: 4 mg via INTRAVENOUS

## 2017-05-13 MED ORDER — ALBUTEROL SULFATE HFA 108 (90 BASE) MCG/ACT IN AERS
INHALATION_SPRAY | RESPIRATORY_TRACT | Status: AC
  Filled 2017-05-13: qty 6.7

## 2017-05-13 MED ORDER — ONDANSETRON 4 MG PO TBDP: 4.0000 mg | ORAL_TABLET | Freq: Four times a day (QID) | ORAL | Status: DC | PRN

## 2017-05-13 MED ORDER — HYDRALAZINE HCL 20 MG/ML IJ SOLN: 10.0000 mg | INTRAMUSCULAR | Status: DC | PRN

## 2017-05-13 MED ORDER — ONDANSETRON HCL 4 MG/2ML IJ SOLN: 4.0000 mg | Freq: Four times a day (QID) | INTRAMUSCULAR | Status: DC | PRN

## 2017-05-13 MED ORDER — METOPROLOL TARTRATE 5 MG/5ML IV SOLN: 5.0000 mg | Freq: Four times a day (QID) | INTRAVENOUS | Status: DC | PRN

## 2017-05-13 MED ORDER — 0.9 % SODIUM CHLORIDE (POUR BTL) OPTIME
TOPICAL | Status: DC | PRN
  Administered 2017-05-13: 1000 mL

## 2017-05-13 MED ORDER — BUPIVACAINE-EPINEPHRINE (PF) 0.5% -1:200000 IJ SOLN
INTRAMUSCULAR | Status: DC | PRN
  Administered 2017-05-13: 20 mL

## 2017-05-13 MED ORDER — FENTANYL CITRATE (PF) 100 MCG/2ML IJ SOLN: 25.0000 ug | INTRAMUSCULAR | Status: DC | PRN

## 2017-05-13 MED ORDER — CEFAZOLIN SODIUM-DEXTROSE 2-4 GM/100ML-% IV SOLN
INTRAVENOUS | Status: AC
  Filled 2017-05-13: qty 100

## 2017-05-13 MED ORDER — PHENYLEPHRINE 40 MCG/ML (10ML) SYRINGE FOR IV PUSH (FOR BLOOD PRESSURE SUPPORT)
PREFILLED_SYRINGE | INTRAVENOUS | Status: AC
  Filled 2017-05-13: qty 10

## 2017-05-13 MED ORDER — MIDAZOLAM HCL 5 MG/5ML IJ SOLN
INTRAMUSCULAR | Status: DC | PRN
  Administered 2017-05-13: 2 mg via INTRAVENOUS

## 2017-05-13 MED ORDER — ONDANSETRON HCL 4 MG/2ML IJ SOLN
INTRAMUSCULAR | Status: AC
  Filled 2017-05-13: qty 2

## 2017-05-13 MED ORDER — PROPOFOL 10 MG/ML IV BOLUS
INTRAVENOUS | Status: DC | PRN
  Administered 2017-05-13: 120 mg via INTRAVENOUS

## 2017-05-13 MED ORDER — ACETAMINOPHEN 500 MG PO TABS: 1000.0000 mg | ORAL_TABLET | ORAL | Status: DC

## 2017-05-13 MED ORDER — BOOST / RESOURCE BREEZE PO LIQD CUSTOM
1.0000 | Freq: Three times a day (TID) | ORAL | Status: DC
  Administered 2017-05-13 (×2): 1 via ORAL

## 2017-05-13 MED ORDER — TRAZODONE HCL 50 MG PO TABS
25.0000 mg | ORAL_TABLET | Freq: Every evening | ORAL | Status: DC | PRN
  Administered 2017-05-14: 25 mg via ORAL
  Filled 2017-05-13: qty 1

## 2017-05-13 MED ORDER — MEPERIDINE HCL 50 MG/ML IJ SOLN: 6.2500 mg | INTRAMUSCULAR | Status: DC | PRN

## 2017-05-13 MED ORDER — LACTATED RINGERS IV SOLN: INTRAVENOUS | Status: DC

## 2017-05-13 MED ORDER — ROCURONIUM BROMIDE 10 MG/ML (PF) SYRINGE
PREFILLED_SYRINGE | INTRAVENOUS | Status: DC | PRN
  Administered 2017-05-13: 40 mg via INTRAVENOUS

## 2017-05-13 MED ORDER — TECHNETIUM TC 99M SULFUR COLLOID FILTERED
1.0000 | Freq: Once | INTRAVENOUS | Status: AC | PRN
  Administered 2017-05-13: 1 via INTRADERMAL

## 2017-05-13 MED ORDER — EPHEDRINE SULFATE-NACL 50-0.9 MG/10ML-% IV SOSY
PREFILLED_SYRINGE | INTRAVENOUS | Status: DC | PRN
  Administered 2017-05-13: 10 mg via INTRAVENOUS

## 2017-05-13 MED ORDER — LEVOTHYROXINE SODIUM 25 MCG PO TABS
125.0000 ug | ORAL_TABLET | Freq: Every day | ORAL | Status: DC
  Administered 2017-05-14: 125 ug via ORAL
  Filled 2017-05-13: qty 1

## 2017-05-13 MED ORDER — LACTATED RINGERS IV SOLN
INTRAVENOUS | Status: DC | PRN
  Administered 2017-05-13 (×2): via INTRAVENOUS

## 2017-05-13 MED ORDER — TAMOXIFEN CITRATE 20 MG PO TABS: 20.0000 mg | ORAL_TABLET | Freq: Every day | ORAL | Status: DC

## 2017-05-13 MED ORDER — MIDAZOLAM HCL 2 MG/2ML IJ SOLN
INTRAMUSCULAR | Status: AC
  Filled 2017-05-13: qty 2

## 2017-05-13 MED ORDER — LIDOCAINE 2% (20 MG/ML) 5 ML SYRINGE
INTRAMUSCULAR | Status: AC
  Filled 2017-05-13: qty 5

## 2017-05-13 SURGICAL SUPPLY — 55 items
BINDER BREAST LRG (GAUZE/BANDAGES/DRESSINGS) ×3 IMPLANT
BINDER BREAST XLRG (GAUZE/BANDAGES/DRESSINGS) IMPLANT
BIOPATCH RED 1 DISK 7.0 (GAUZE/BANDAGES/DRESSINGS) ×2 IMPLANT
BIOPATCH RED 1IN DISK 7.0MM (GAUZE/BANDAGES/DRESSINGS) ×1
CANISTER SUCT 3000ML PPV (MISCELLANEOUS) ×6 IMPLANT
CHLORAPREP W/TINT 26ML (MISCELLANEOUS) ×3 IMPLANT
CLIP VESOCCLUDE MED 6/CT (CLIP) ×3 IMPLANT
CONT SPEC 4OZ CLIKSEAL STRL BL (MISCELLANEOUS) IMPLANT
COVER PROBE W GEL 5X96 (DRAPES) ×3 IMPLANT
COVER SURGICAL LIGHT HANDLE (MISCELLANEOUS) ×3 IMPLANT
DERMABOND ADVANCED (GAUZE/BANDAGES/DRESSINGS) ×2
DERMABOND ADVANCED .7 DNX12 (GAUZE/BANDAGES/DRESSINGS) ×1 IMPLANT
DEVICE DISSECT PLASMABLAD 3.0S (MISCELLANEOUS) ×1 IMPLANT
DRAIN CHANNEL 19F RND (DRAIN) ×3 IMPLANT
DRAPE CHEST BREAST 15X10 FENES (DRAPES) ×3 IMPLANT
DRAPE SURG 17X23 STRL (DRAPES) ×12 IMPLANT
DRSG TEGADERM 2-3/8X2-3/4 SM (GAUZE/BANDAGES/DRESSINGS) ×3 IMPLANT
ELECT CAUTERY BLADE 6.4 (BLADE) ×3 IMPLANT
ELECT REM PT RETURN 9FT ADLT (ELECTROSURGICAL) ×6
ELECTRODE REM PT RTRN 9FT ADLT (ELECTROSURGICAL) ×2 IMPLANT
EVACUATOR SILICONE 100CC (DRAIN) ×3 IMPLANT
GLOVE BIO SURGEON STRL SZ7.5 (GLOVE) ×3 IMPLANT
GLOVE BIOGEL PI IND STRL 6.5 (GLOVE) ×1 IMPLANT
GLOVE BIOGEL PI IND STRL 7.5 (GLOVE) ×1 IMPLANT
GLOVE BIOGEL PI IND STRL 8 (GLOVE) ×1 IMPLANT
GLOVE BIOGEL PI INDICATOR 6.5 (GLOVE) ×2
GLOVE BIOGEL PI INDICATOR 7.5 (GLOVE) ×2
GLOVE BIOGEL PI INDICATOR 8 (GLOVE) ×2
GLOVE ECLIPSE 7.5 STRL STRAW (GLOVE) ×3 IMPLANT
GOWN STRL REUS W/ TWL LRG LVL3 (GOWN DISPOSABLE) ×3 IMPLANT
GOWN STRL REUS W/ TWL XL LVL3 (GOWN DISPOSABLE) ×1 IMPLANT
GOWN STRL REUS W/TWL LRG LVL3 (GOWN DISPOSABLE) ×6
GOWN STRL REUS W/TWL XL LVL3 (GOWN DISPOSABLE) ×2
KIT BASIN OR (CUSTOM PROCEDURE TRAY) ×3 IMPLANT
KIT ROOM TURNOVER OR (KITS) ×3 IMPLANT
NEEDLE 18GX1X1/2 (RX/OR ONLY) (NEEDLE) IMPLANT
NEEDLE BLUNT 16X1.5 OR ONLY (NEEDLE) IMPLANT
NEEDLE HYPO 25GX1X1/2 BEV (NEEDLE) IMPLANT
NS IRRIG 1000ML POUR BTL (IV SOLUTION) ×3 IMPLANT
PACK GENERAL/GYN (CUSTOM PROCEDURE TRAY) ×3 IMPLANT
PAD ABD 8X10 STRL (GAUZE/BANDAGES/DRESSINGS) ×3 IMPLANT
PAD ARMBOARD 7.5X6 YLW CONV (MISCELLANEOUS) ×3 IMPLANT
PLASMABLADE 3.0S (MISCELLANEOUS) ×3
SPECIMEN JAR X LARGE (MISCELLANEOUS) ×3 IMPLANT
STAPLER VISISTAT 35W (STAPLE) ×3 IMPLANT
SUT ETHILON 2 0 FS 18 (SUTURE) ×3 IMPLANT
SUT MNCRL AB 4-0 PS2 18 (SUTURE) ×6 IMPLANT
SUT VIC AB 3-0 54X BRD REEL (SUTURE) ×1 IMPLANT
SUT VIC AB 3-0 BRD 54 (SUTURE) ×2
SUT VIC AB 3-0 SH 18 (SUTURE) ×6 IMPLANT
SYR CONTROL 10ML LL (SYRINGE) IMPLANT
TOWEL OR 17X24 6PK STRL BLUE (TOWEL DISPOSABLE) ×3 IMPLANT
TOWEL OR 17X26 10 PK STRL BLUE (TOWEL DISPOSABLE) ×3 IMPLANT
TUBE CONNECTING 12'X1/4 (SUCTIONS) ×1
TUBE CONNECTING 12X1/4 (SUCTIONS) ×2 IMPLANT

## 2017-05-13 NOTE — Anesthesia Postprocedure Evaluation (Signed)
Anesthesia Post Note  Patient: TAMZIN BERTLING  Procedure(s) Performed: LEFT TOTAL MASTECTOMY WITH AXILLARY SENTINEL LYMPH NODE BIOPSY (Left Breast)     Patient location during evaluation: PACU Anesthesia Type: General Level of consciousness: awake and alert Pain management: pain level controlled Vital Signs Assessment: post-procedure vital signs reviewed and stable Respiratory status: spontaneous breathing, nonlabored ventilation, respiratory function stable and patient connected to nasal cannula oxygen Cardiovascular status: blood pressure returned to baseline and stable Postop Assessment: no apparent nausea or vomiting Anesthetic complications: no    Last Vitals:  Vitals:   05/13/17 1109 05/13/17 1130  BP:    Pulse: 90 96  Resp: 18   Temp: (!) 36.1 C 37.1 C  SpO2: 97% 94%    Last Pain:  Vitals:   05/13/17 1130  TempSrc: Oral  PainSc:                  Effie Berkshire

## 2017-05-13 NOTE — Anesthesia Procedure Notes (Addendum)
Procedure Name: Intubation Date/Time: 05/13/2017 7:42 AM Performed by: Orlie Dakin, CRNA Pre-anesthesia Checklist: Patient identified, Emergency Drugs available, Suction available, Patient being monitored and Timeout performed Patient Re-evaluated:Patient Re-evaluated prior to induction Oxygen Delivery Method: Circle system utilized Preoxygenation: Pre-oxygenation with 100% oxygen Induction Type: IV induction Ventilation: Mask ventilation without difficulty Laryngoscope Size: Miller and 3 Grade View: Grade I Tube type: Oral Tube size: 7.0 mm Number of attempts: 1 Airway Equipment and Method: Stylet Placement Confirmation: ETT inserted through vocal cords under direct vision,  positive ETCO2 and breath sounds checked- equal and bilateral Secured at: 22 cm Tube secured with: Tape Dental Injury: Teeth and Oropharynx as per pre-operative assessment  Comments: Noted bilateral wheezing breath sounds pre-op.  Noted teeth in very poor condition pre-op.  None loose per patient report, several missing, numerous upper and lower front are chipped, worn down due to grinding her teeth according to patient. 4x4s bite block used.

## 2017-05-13 NOTE — Interval H&P Note (Signed)
History and Physical Interval Note:  05/13/2017 7:32 AM  [name]  has presented today for surgery, with the diagnosis of LEFT BREAST CANCER  The various methods of treatment have been discussed with the patient and family. After consideration of risks, benefits and other options for treatment, the patient has consented to  Procedure(s): LEFT TOTAL MASTECTOMY WITH AXILLARY SENTINEL LYMPH NODE BIOPSY (Left) as a surgical intervention .  The patient's history has been reviewed, patient examined, no change in status, stable for surgery.  I have reviewed the patient's chart and labs.  Questions were answered to the patient's satisfaction.     Darene Lamer Kortny Lirette

## 2017-05-13 NOTE — Progress Notes (Signed)
Received pt from PACU, A&O x3. Calm and cooperative. Left chest incision clean and dry. JP drain in place. Denies pain at this time.

## 2017-05-13 NOTE — H&P (Signed)
History of Present Illness  The patient is a 72 year old female who presents with breast cancer. She is a post menopausal female referred by Dr. Jeanmarie Plant for evaluation of recently diagnosed carcinoma of the left breast. She reports being able to feel a mass in her left breast 3-4 weeks ago. Subsequent imaging included diagnostic mamogram showing abnormal density in the upper left breast and ultrasound showing an indistinct mass at the 6:00 position measuring 2.1 cm and a 3.5 cm mass in the superior breast at 1:00.. An ultrasound guided breast biopsy was performed on 03/04/2017 with pathology revealing invasive ductal carcinoma of the breast. She is seen now in breast multidisciplinary clinic for initial treatment planning. She has experienced a mass but no nipple discharge or skin changes or pain. She does not have a personal history of any previous breast problems.  Findings at that time were the following: Tumor size: 3.5 and 2.1 cm Tumor grade: 1 Estrogen Receptor: positive Progesterone Receptor: negative Her-2 neu: negative Lymph node status: negative    Problem List/Past Medical  FECAL INCONTINENCE (R15.9)  MALIGNANT NEOPLASM OF OVERLAPPING SITES OF LEFT BREAST IN FEMALE, ESTROGEN RECEPTOR POSITIVE (G29.528)   Past Surgical History  Cataract Surgery  Right. Colon Polyp Removal - Colonoscopy  Oral Surgery  Thyroid Surgery  Tonsillectomy   Diagnostic Studies History  Colonoscopy  1-5 years ago Mammogram  within last year Pap Smear  1-5 years ago  Allergies  SEROquel XR *ANTIPSYCHOTICS/ANTIMANIC AGENTS*  Amoxicillin *PENICILLINS*  Nausea. Ciprofloxacin *CHEMICALS*  Haloperidol *CHEMICALS*  Ketorolac *ANALGESICS - ANTI-INFLAMMATORY*  Latuda *ANTIPSYCHOTICS/ANTIMANIC AGENTS*  Thorazine *ANTIPSYCHOTICS/ANTIMANIC AGENTS*  Wellbutrin *ANTIDEPRESSANTS*   Medication History  Levothyroxine Sodium (125MCG Capsule, Oral) Active. Lexapro (20MG Tablet, Oral  daily) Active.  Social History  Tobacco use  Former smoker.  Family History  Alcohol Abuse  Family Members In General, Father, Mother, Sister. Heart Disease  Mother. Hypertension  Mother.  Pregnancy / Birth History  Age at menarche  25 years. Age of menopause  68-55 Gravida  2 Maternal age  54-20 Para  2  Other Problems  Anxiety Disorder  Arthritis  Back Pain  Chest pain  High blood pressure  Thyroid Disease     Physical Exam  The physical exam findings are as follows: Note:General: Alert, thin elderly somewhat chronically ill-appearing Caucasian female, in no distress Skin: Warm and dry without rash or infection. HEENT: No palpable masses or thyromegaly. Sclera nonicteric. Pupils equal round and reactive. poor dentition. breasts: There is bruising post biopsy in the left breast. There are movable palpable masses in the upper outer and lower breast. No skin involvement. No palpable axillary adenopathy. Lymph nodes: No cervical, supraclavicular, or inguinal nodes palpable. Lungs: Breath sounds clear and equal. No wheezing or increased work of breathing. Cardiovascular: Regular rate and rhythm without murmer. No JVD or edema. Peripheral pulses intact. No carotid bruits. Abdomen: Nondistended. Soft and nontender. No masses palpable. No organomegaly. No palpable hernias. Extremities: No edema or joint swelling or deformity. No chronic venous stasis changes. Neurologic: Alert and fully oriented. Gait normal. No focal weakness. Psychiatric: Normal mood and affect. Thought content appropriate with normal judgement and insight    Assessment & Plan  MALIGNANT NEOPLASM OF OVERLAPPING SITES OF LEFT BREAST IN FEMALE, ESTROGEN RECEPTOR POSITIVE (U13.244) Impression: 72 year old female with a new diagnosis of cancer of the left breast, multi-centric. Clinical stage 1B, ER +, PR -, HER-2 -. I discussed with the patient and her daughters present today initial surgical  treatment options. We discussed options of breast conservation with lumpectomy or total mastectomy and sentinal lymph node biopsy/dissection. with widely separated multicentric disease and small breast size I recommended total mastectomy. briefly discussed reconstruction and she is not interested nor would she be a good candidate. After discussion they have elected to proceed with lleft total mastectomy with axillary sentinel lymph node biopsy. We discussed the indications and nature of the procedure, and expected recovery, in detail. Surgical risks including anesthetic complications, cardiorespiratory complications, bleeding, infection, wound healing complications, blood clots, lymphedema, local and distant recurrence and possible need for further surgery based on the final pathology was discussed and understood. Chemotherapy, hormonal therapy and radiation therapy have been discussed. They have been provided with literature regarding the treatment of breast cancer. All questions were answered. They understand and agree to proceed and we will go ahead with scheduling. Current Plans left total mastectomy and axillary sentinel lymph node biopsy under general anesthesia with overnight hospitalization.

## 2017-05-13 NOTE — Op Note (Signed)
Preoperative Diagnosis: LEFT BREAST CANCER  Postoprative Diagnosis: LEFT BREAST CANCER  Procedure: Procedure(s): LEFT TOTAL MASTECTOMY WITH AXILLARY SENTINEL LYMPH NODE BIOPSY   Surgeon: Excell Seltzer T   Assistants: Sharyn Dross RNFA  Anesthesia:  General LMA anesthesia  Indications: 72 year old female with a new diagnosis of cancer of the left breast, multi-centric. Clinical stage 1B, ER +, PR -, HER-2 -.  After extensive workup and discussion detailed elsewhere we have elected to proceed with left total mastectomy and axillary sentinel lymph node biopsy as initial surgical therapy.     Procedure Detail: In the holding area the patient underwent a pectoral block by anesthesia and underwent injection of 1 mCi of technetium sulfur colloid intradermally around the left nipple.  She was taken to the operating room, placed in the supine position on the operating table and general laryngeal mask anesthesia induced.  The entire left breast and chest and upper arm were widely sterilely prepped and draped.  She received preoperative IV antibiotics.  Patient timeout was performed and correct procedure verified.  A transverse elliptical incision was made encompassing the nipple areolar complex.  Skin and subcutaneous flaps were then raised superiorly toward the clavicle, medially to the edge of the sternum, inferiorly to the origin of the rectus and laterally out to the anterior border of the latissimus.  The breast was then reflected off the chest wall working medial to lateral dividing attachments to the pectoralis and serratus.  The lateral border of the pectoralis was freed of the specimen dissected up anteriorly off the latissimus.  The clavipectoral fascia was incised in the axilla exposed.  Using the neoprobe for guidance I was able to identify 3 lymph nodes with significantly elevated counts from about 150 up to 500.  No obvious involvement.  At this point background in the axilla was minimal  and there were no palpable lymph nodes.  I came across the low axilla with Kelly clamps and this was tied with 3-0 Vicryl and the specimen removed and oriented and sent for permanent pathology.  The wound was irrigated and complete hemostasis obtained.  A 19 Blake closed suction drain was brought out a separate stab wound and left under both flaps.  The subtenons tissue was closed with interrupted 3-0 Vicryl and skin with running some particular 5-0 Monocryl and Dermabond.  Sponge needle and instrument counts were correct.    Findings: As above  Estimated Blood Loss:  less than 50 mL         Drains: 19 round Blake drain  Blood Given: none          Specimens: #1 left total mastectomy #2 left axillary sentinel lymph nodes X 3        Complications:  * No complications entered in OR log *         Disposition: PACU - hemodynamically stable.         Condition: stable

## 2017-05-13 NOTE — Progress Notes (Signed)
Patient stated she was nauseated and just wanted to take gabapentin, and not tylenol.

## 2017-05-13 NOTE — Anesthesia Procedure Notes (Signed)
Anesthesia Regional Block: Pectoralis block   Pre-Anesthetic Checklist: ,, timeout performed, Correct Patient, Correct Site, Correct Laterality, Correct Procedure, Correct Position, site marked, Risks and benefits discussed,  Surgical consent,  Pre-op evaluation,  At surgeon's request and post-op pain management  Laterality: Left  Prep: chloraprep       Needles:  Injection technique: Single-shot  Needle Type: Echogenic Needle     Needle Length: 9cm  Needle Gauge: 21     Additional Needles:   Procedures:,,,, ultrasound used (permanent image in chart),,,,  Narrative:  Start time: 05/13/2017 7:05 AM End time: 05/13/2017 7:15 AM Injection made incrementally with aspirations every 5 mL.  Performed by: Personally  Anesthesiologist: Effie Berkshire, MD  Additional Notes: Patient tolerated the procedure well. Local anesthetic introduced in an incremental fashion under minimal resistance after negative aspirations. No paresthesias were elicited. After completion of the procedure, no acute issues were identified and patient continued to be monitored by RN.

## 2017-05-13 NOTE — Transfer of Care (Signed)
Immediate Anesthesia Transfer of Care Note  Patient: [name]  Procedure(s) Performed: LEFT TOTAL MASTECTOMY WITH AXILLARY SENTINEL LYMPH NODE BIOPSY (Left Breast)  Patient Location: PACU  Anesthesia Type:General and Regional  Level of Consciousness: unresponsive and drowsy  Airway & Oxygen Therapy: Patient Spontanous Breathing and Patient connected to face mask oxygen  Post-op Assessment: Report given to RN and Post -op Vital signs reviewed and stable  Post vital signs: Reviewed and stable  Last Vitals:  Vitals:   05/13/17 0545 05/13/17 0930  BP: (!) 177/98 (!) 154/71  Pulse: 83 (!) 101  Resp: 20 14  Temp: 36.7 C (!) 36.4 C  SpO2: 95% 100%    Last Pain:  Vitals:   05/13/17 0603  TempSrc:   PainSc: 5       Patients Stated Pain Goal: 1 (40/37/09 6438)  Complications: No apparent anesthesia complications

## 2017-05-14 ENCOUNTER — Encounter (HOSPITAL_COMMUNITY): Payer: Self-pay | Admitting: General Surgery

## 2017-05-14 DIAGNOSIS — I739 Peripheral vascular disease, unspecified: Secondary | ICD-10-CM | POA: Diagnosis not present

## 2017-05-14 DIAGNOSIS — K219 Gastro-esophageal reflux disease without esophagitis: Secondary | ICD-10-CM | POA: Diagnosis not present

## 2017-05-14 DIAGNOSIS — C50812 Malignant neoplasm of overlapping sites of left female breast: Secondary | ICD-10-CM | POA: Diagnosis not present

## 2017-05-14 DIAGNOSIS — C773 Secondary and unspecified malignant neoplasm of axilla and upper limb lymph nodes: Secondary | ICD-10-CM | POA: Diagnosis not present

## 2017-05-14 DIAGNOSIS — F419 Anxiety disorder, unspecified: Secondary | ICD-10-CM | POA: Diagnosis not present

## 2017-05-14 DIAGNOSIS — I10 Essential (primary) hypertension: Secondary | ICD-10-CM | POA: Diagnosis not present

## 2017-05-14 LAB — CBC
HCT: 32.5 % — ABNORMAL LOW (ref 36.0–46.0)
Hemoglobin: 10.6 g/dL — ABNORMAL LOW (ref 12.0–15.0)
MCH: 32 pg (ref 26.0–34.0)
MCHC: 32.6 g/dL (ref 30.0–36.0)
MCV: 98.2 fL (ref 78.0–100.0)
Platelets: 194 10*3/uL (ref 150–400)
RBC: 3.31 MIL/uL — ABNORMAL LOW (ref 3.87–5.11)
RDW: 13.1 % (ref 11.5–15.5)
WBC: 7 10*3/uL (ref 4.0–10.5)

## 2017-05-14 MED ORDER — OXYCODONE HCL 5 MG PO TABS: 5.0000 mg | ORAL_TABLET | Freq: Four times a day (QID) | ORAL | 0 refills | Status: DC | PRN

## 2017-05-14 MED ORDER — DOCUSATE SODIUM 100 MG PO CAPS: 100.0000 mg | ORAL_CAPSULE | Freq: Two times a day (BID) | ORAL | 0 refills | Status: AC

## 2017-05-14 NOTE — Progress Notes (Signed)
Discharge instructions given to pt, drain care and emptying done with pt, verbalized understanding. Discharged to home accompanied by daughter.

## 2017-05-14 NOTE — Discharge Instructions (Signed)
[facility] °[phone] ° °MASTECTOMY: POST OP INSTRUCTIONS ° °Always review your discharge instruction sheet given to you by the facility where your surgery was performed. °IF YOU HAVE DISABILITY OR FAMILY LEAVE FORMS, YOU MUST BRING THEM TO THE OFFICE FOR PROCESSING.   °DO NOT GIVE THEM TO YOUR DOCTOR. °A prescription for pain medication may be given to you upon discharge.  Take your pain medication as prescribed, if needed.  If narcotic pain medicine is not needed, then you may take acetaminophen (Tylenol) or ibuprofen (Advil) as needed. °1. Take your usually prescribed medications unless otherwise directed. °2. If you need a refill on your pain medication, please contact your pharmacy.  They will contact our office to request authorization.  Prescriptions will not be filled after 5pm or on week-ends. °3. You should follow a light diet the first few days after arrival home, such as soup and crackers, etc.  Resume your normal diet the day after surgery. °4. Most patients will experience some swelling and bruising on the chest and underarm.  Ice packs will help.  Swelling and bruising can take several days to resolve.  °5. It is common to experience some constipation if taking pain medication after surgery.  Increasing fluid intake and taking a stool softener (such as Colace) will usually help or prevent this problem from occurring.  A mild laxative (Milk of Magnesia or Miralax) should be taken according to package instructions if there are no bowel movements after 48 hours. °6. Unless discharge instructions indicate otherwise, leave your bandage dry and in place until your next appointment in 3-5 days.  You may take a limited sponge bath.  No tube baths or showers until the drains are removed.  You may have steri-strips (small skin tapes) in place directly over the incision.  These strips should be left on the skin for 7-10 days.  If your surgeon used skin glue on the incision, you may  shower in 24 hours.  The glue will flake off over the next 2-3 weeks.  Any sutures or staples will be removed at the office during your follow-up visit. °7. DRAINS:  If you have drains in place, it is important to keep a list of the amount of drainage produced each day in your drains.  Before leaving the hospital, you should be instructed on drain care.  Call our office if you have any questions about your drains. °8. ACTIVITIES:  You may resume regular (light) daily activities beginning the next day--such as daily self-care, walking, climbing stairs--gradually increasing activities as tolerated.  You may have sexual intercourse when it is comfortable.  Refrain from any heavy lifting or straining until approved by your doctor. °a. You may drive when you are no longer taking prescription pain medication, you can comfortably wear a seatbelt, and you can safely maneuver your car and apply brakes. °b. RETURN TO WORK:  __________________________________________________________ °9. You should see your doctor in the office for a follow-up appointment approximately 3-5 days after your surgery.  Your doctor’s nurse will typically make your follow-up appointment when she calls you with your pathology report.  Expect your pathology report 2-3 business days after your surgery.  You may call to check if you do not hear from us after three days.   °10. OTHER INSTRUCTIONS: ______________________________________________________________________________________________ ____________________________________________________________________________________________ °WHEN TO CALL YOUR DOCTOR: °1. Fever over 101.0 °2. Nausea and/or vomiting °3. Extreme swelling or bruising °4. Continued bleeding from incision. °5. Increased pain, redness, or drainage from the incision. °  The clinic staff is available to answer your questions during regular business hours.  Please don’t hesitate to call and ask to speak to one of the nurses for clinical  concerns.  If you have a medical emergency, go to the nearest emergency room or call 911.  A surgeon from [facility] is always on call at the hospital. °[address], [address], [place], [place]  [address] ? [address] [address], [place], [place]   [address] °[phone] ? [phone] ? FAX [phone] °Web site: [email or web address] °

## 2017-05-14 NOTE — Discharge Summary (Signed)
Physician Discharge Summary  Patient ID: LAYNI KREAMER MRN: [id number] DOB/AGE: 10-06-1945 72 y.o.  Admit date: 05/13/2017 Discharge date: 05/14/2017  Admission Diagnoses:  Discharge Diagnoses:  Active Problems:   Cancer of left breast, stage 1, estrogen receptor positive ([place])   Discharged Condition: good  Hospital Course: Admitted for supportive care following left total mastectomy with sentinel lymph node biopsy. Did well overnight. On POD 1 her pain was well controlled. Her hgb had decreased from 13.1 to 10.6 however her incision is dry and intact without erythema or drainage, no hematoma or fluid under the flaps. Her drain output is serosanguinous and low volume. No tachycardia or clinical evidence of ongoing bleeding. She was deemed stable for discharge home.   Discharge Exam: Blood pressure 114/62, pulse 81, temperature 98.2 F (36.8 C), resp. rate 16, last menstrual period 01/09/2012, SpO2 92 %. General appearance: alert and cooperative Resp: clear to auscultation bilaterally Chest wall: left sided chest wall tenderness, appropriate post op Breasts: left side is flat without any fluctuance or swelling under the flaps. Driain output 90cc last 24h, serosanguinous Cardio: regular rate and rhythm Extremities: extremities normal, atraumatic, no cyanosis or edema Skin: Skin color, texture, turgor normal. No rashes or lesions Neurologic: Grossly normal Incision/Wound: c/d/i with dermabond  Disposition: 01-Home or Self Care  Discharge Instructions    Diet - low sodium heart healthy   Complete by:  As directed    Increase activity slowly   Complete by:  As directed      Allergies as of 05/14/2017      Reactions   Seroquel [quetiapine Fumarate] Other (See Comments)   Reaction:  Drowsiness    Amoxicillin Nausea Only, Other (See Comments)   Has patient had a PCN reaction causing immediate rash, facial/tongue/throat swelling, SOB or lightheadedness with hypotension: No Has patient  had a PCN reaction causing severe rash involving mucus membranes or skin necrosis: No Has patient had a PCN reaction that required hospitalization No Has patient had a PCN reaction occurring within the last 10 years: No If all of the above answers are "NO", then may proceed with Cephalosporin use.   Ciprofloxacin Other (See Comments)   Reaction:  Unknown    Haloperidol Lactate Other (See Comments)   Reaction:  Arm stiffness    Ketorolac Tromethamine Other (See Comments)   Reaction:  Unknown    Latuda [lurasidone Hcl] Other (See Comments)   Pt states that this medication makes her feel "weird".     Thorazine [chlorpromazine] Other (See Comments)   Reaction:  Unknown    Wellbutrin [bupropion] Other (See Comments)   Pt states that this medication makes her feel "weird".        Medication List    TAKE these medications   aspirin 325 MG tablet Take 325 mg by mouth daily.   docusate sodium 100 MG capsule Commonly known as:  COLACE Take 1 capsule (100 mg total) by mouth 2 (two) times daily.   escitalopram 10 MG tablet Commonly known as:  LEXAPRO Take 2 tablets (20 mg total) by mouth daily.   levothyroxine 125 MCG tablet Commonly known as:  SYNTHROID, LEVOTHROID Take 1 tablet (125 mcg total) by mouth daily before breakfast.   loperamide 2 MG capsule Commonly known as:  IMODIUM Take by mouth as needed for diarrhea or loose stools (as needed for diarrhea).   oxyCODONE 5 MG immediate release tablet Commonly known as:  Oxy IR/ROXICODONE Take 1-2 tablets (5-10 mg total) by mouth every 6 (six)  hours as needed for moderate pain.   promethazine 25 MG tablet Commonly known as:  PHENERGAN Take one half tablet by mouth every 6-8 hours as needed for nausea or vomiting.   tamoxifen 20 MG tablet Commonly known as:  NOLVADEX Take 1 tablet (20 mg total) by mouth daily.   traZODone 50 MG tablet Commonly known as:  DESYREL Take 25 mg by mouth at bedtime as needed for sleep.       Follow-up Information    Excell Seltzer, MD Follow up in 2 week(s).   Specialty:  General Surgery Contact information: [address] [place] [address] [phone]           Signed: Clovis Riley 05/14/2017, 8:50 AM

## 2017-05-23 ENCOUNTER — Ambulatory Visit: Payer: Medicare Other | Admitting: Hematology and Oncology

## 2017-05-24 ENCOUNTER — Telehealth: Payer: Self-pay | Admitting: Hematology and Oncology

## 2017-05-24 ENCOUNTER — Inpatient Hospital Stay: Payer: Medicare Other | Attending: Hematology and Oncology | Admitting: Hematology and Oncology

## 2017-05-24 DIAGNOSIS — C50812 Malignant neoplasm of overlapping sites of left female breast: Secondary | ICD-10-CM | POA: Insufficient documentation

## 2017-05-24 DIAGNOSIS — F329 Major depressive disorder, single episode, unspecified: Secondary | ICD-10-CM | POA: Diagnosis not present

## 2017-05-24 DIAGNOSIS — Z7982 Long term (current) use of aspirin: Secondary | ICD-10-CM | POA: Diagnosis not present

## 2017-05-24 DIAGNOSIS — Z170 Estrogen receptor positive status [ER+]: Secondary | ICD-10-CM | POA: Diagnosis not present

## 2017-05-24 DIAGNOSIS — Z79899 Other long term (current) drug therapy: Secondary | ICD-10-CM | POA: Insufficient documentation

## 2017-05-24 MED ORDER — ESCITALOPRAM OXALATE 10 MG PO TABS: 20.0000 mg | ORAL_TABLET | Freq: Every day | ORAL | 3 refills | Status: DC

## 2017-05-24 MED ORDER — TAMOXIFEN CITRATE 20 MG PO TABS: 20.0000 mg | ORAL_TABLET | Freq: Every day | ORAL | 3 refills | Status: DC

## 2017-05-24 NOTE — Progress Notes (Signed)
Patient Care Team: Guadalupe Dawn, MD as PCP - General Magrinat, Virgie Dad, MD as Consulting Physician (Oncology) Eppie Gibson, MD as Attending Physician (Radiation Oncology) Erroll Luna, MD as Consulting Physician (General Surgery)  DIAGNOSIS:  Encounter Diagnosis  Name Primary?  . Malignant neoplasm of overlapping sites of left breast in female, estrogen receptor positive ([place])     SUMMARY OF ONCOLOGIC HISTORY:   Malignant neoplasm of overlapping sites of left breast in female, estrogen receptor positive ([place])   03/04/2017 Initial Diagnosis    Palpable left breast masses with skin thickening, by ultrasound 2 masses were noted at 5:00 2.1 cm and at 1:00 3.5 cm, biopsy of both of these lesions grade 2 invasive ductal carcinoma with lymphovascular invasion and perineural invasion, ER 100%, PR 0%, HER-2 negative ratio 1.03, Ki-67 5%, T2 N0 stage II a clinical stage      05/13/2017 Surgery    Left simple mastectomy: IDC grade 2, 7.7 cm, DCIS intermediate grade, lymphovascular invasion identified, perineural invasion present, margins negative, 1/2 lymph nodes positive, ER 6200% strong, PR 0%, HER-2 negative, Ki-67 5%, T3N1 a stage IIIa       CHIEF COMPLIANT: Follow-up after recent left mastectomy  INTERVAL HISTORY: [name] is a 72 year old with above-mentioned history left breast cancer underwent left mastectomy and is here today to discuss pathology report accompanied by her daughter.  She tells me that overall she has been doing fairly well.  Her biggest issue is related to the drains.  She wishes that they were out.  She continues to tell me that she has problems with depression.  However she is not suicidal.  She sleeps for 15 hours every day.  REVIEW OF SYSTEMS:   Constitutional: Denies fevers, chills or abnormal weight loss Eyes: Denies blurriness of vision Ears, nose, mouth, throat, and face: Denies mucositis or sore throat Respiratory: Denies cough, dyspnea or  wheezes Cardiovascular: Denies palpitation, chest discomfort Gastrointestinal:  Denies nausea, heartburn or change in bowel habits Skin: Denies abnormal skin rashes Lymphatics: Denies new lymphadenopathy or easy bruising Neurological:Denies numbness, tingling or new weaknesses Behavioral/Psych: Major depression Extremities: No lower extremity edema Breast: Left mastectomy All other systems were reviewed with the patient and are negative.  I have reviewed the past medical history, past surgical history, social history and family history with the patient and they are unchanged from previous note.  ALLERGIES:  is allergic to seroquel [quetiapine fumarate]; amoxicillin; ciprofloxacin; haloperidol lactate; ketorolac tromethamine; latuda [lurasidone hcl]; thorazine [chlorpromazine]; and wellbutrin [bupropion].  MEDICATIONS:  Current Outpatient Medications  Medication Sig Dispense Refill  . aspirin 325 MG tablet Take 325 mg by mouth daily.    [name] docusate sodium (COLACE) 100 MG capsule Take 1 capsule (100 mg total) by mouth 2 (two) times daily. 60 capsule 0  . escitalopram (LEXAPRO) 10 MG tablet Take 2 tablets (20 mg total) by mouth daily. 180 tablet 3  . levothyroxine (SYNTHROID, LEVOTHROID) 125 MCG tablet Take 1 tablet (125 mcg total) by mouth daily before breakfast. 30 tablet 1  . loperamide (IMODIUM) 2 MG capsule Take by mouth as needed for diarrhea or loose stools (as needed for diarrhea).    . promethazine (PHENERGAN) 25 MG tablet Take one half tablet by mouth every 6-8 hours as needed for nausea or vomiting. 20 tablet 0  . tamoxifen (NOLVADEX) 20 MG tablet Take 1 tablet (20 mg total) by mouth daily. 90 tablet 3  . traZODone (DESYREL) 50 MG tablet Take 25 mg by mouth at  bedtime as needed for sleep.     Current Facility-Administered Medications  Medication Dose Route Frequency Provider Last Rate Last Dose  . albuterol (PROVENTIL HFA;VENTOLIN HFA) 108 (90 Base) MCG/ACT inhaler 2 puff  2 puff  Inhalation Once Verner Mould, MD        PHYSICAL EXAMINATION: ECOG PERFORMANCE STATUS: 2 - Symptomatic, <50% confined to bed  Vitals:   05/24/17 1436  BP: (!) 163/97  Pulse: 98  Resp: 17  Temp: 98.2 F (36.8 C)  SpO2: 96%   Filed Weights   05/24/17 1436  Weight: 88 lb 3.2 oz (40 kg)    GENERAL:alert, no distress and comfortable SKIN: skin color, texture, turgor are normal, no rashes or significant lesions EYES: normal, Conjunctiva are pink and non-injected, sclera clear OROPHARYNX:no exudate, no erythema and lips, buccal mucosa, and tongue normal  NECK: supple, thyroid normal size, non-tender, without nodularity LYMPH:  no palpable lymphadenopathy in the cervical, axillary or inguinal LUNGS: clear to auscultation and percussion with normal breathing effort HEART: regular rate & rhythm and no murmurs and no lower extremity edema ABDOMEN:abdomen soft, non-tender and normal bowel sounds MUSCULOSKELETAL:no cyanosis of digits and no clubbing  NEURO: alert & oriented x 3 with fluent speech, no focal motor/sensory deficits EXTREMITIES: No lower extremity edema   LABORATORY DATA:  I have reviewed the data as listed CMP Latest Ref Rng & Units 05/09/2017 03/23/2017 12/08/2016  Glucose 65 - 99 mg/dL 119(H) 136 98  BUN 6 - 20 mg/dL 10 15.2 14  Creatinine 0.44 - 1.00 mg/dL 0.89 0.9 0.74  Sodium 135 - 145 mmol/L 139 144 142  Potassium 3.5 - 5.1 mmol/L 3.0(L) 3.0(LL) 3.8  Chloride 101 - 111 mmol/L 102 - 105  CO2 22 - 32 mmol/L _0 Calcium 8.9 - 10.3 mg/dL 8.8(L) 9.2 9.4  Total Protein 6.4 - 8.3 g/dL - 6.2(L) -  Total Bilirubin 0.20 - 1.20 mg/dL - 0.39 -  Alkaline Phos 40 - 150 U/L - 97 -  AST 5 - 34 U/L - 13 -  ALT 0 - 55 U/L - 8 -    Lab Results  Component Value Date   WBC 7.0 05/14/2017   HGB 10.6 (L) 05/14/2017   HCT 32.5 (L) 05/14/2017   MCV 98.2 05/14/2017   PLT 194 05/14/2017   NEUTROABS 4.7 03/23/2017    ASSESSMENT & PLAN:  Malignant neoplasm of  overlapping sites of left breast in female, estrogen receptor positive ([place]) 05/13/2017: Left simple mastectomy: IDC grade 2, 7.7 cm, DCIS intermediate grade, lymphovascular invasion identified, perineural invasion present, margins negative, 1/2 lymph nodes positive, ER 6200% strong, PR 0%, HER-2 negative, Ki-67 5%, T3N1 a stage IIIa  Pathology counseling: I discussed the final pathology report of the patient provided  a copy of this report. I discussed the margins as well as lymph node surgeries. We also discussed the final staging along with previously performed ER/PR and HER-2/neu testing.  Plan: Patient is extremely frail and has major depression issues. She does not want to undergo adjuvant radiation therapy even though this is recommended. She wants to take tamoxifen alone. She does not even want to meet with radiation oncology.  Major depression: I provided her lots of encouragement and reinforcement. Patient sleeps for up to 15 hours every day.  I encouraged her to eat better and have more social interactions.  Encouraged her to be more physically active as well.  Return to clinic in 3 months for survivorship care plan visit.  I spent 25 minutes talking to the patient of which more than half was spent in counseling and coordination of care.  No orders of the defined types were placed in this encounter.  The patient has a good understanding of the overall plan. she agrees with it. she will call with any problems that may develop before the next visit here.   Harriette Ohara, MD 05/24/17

## 2017-05-24 NOTE — Telephone Encounter (Signed)
Gave avs and calendar for may  °

## 2017-05-24 NOTE — Assessment & Plan Note (Addendum)
05/13/2017: Left simple mastectomy: IDC grade 2, 7.7 cm, DCIS intermediate grade, lymphovascular invasion identified, perineural invasion present, margins negative, 1/2 lymph nodes positive, ER 6200% strong, PR 0%, HER-2 negative, Ki-67 5%, T3N1 a stage IIIa  Pathology counseling: I discussed the final pathology report of the patient provided  a copy of this report. I discussed the margins as well as lymph node surgeries. We also discussed the final staging along with previously performed ER/PR and HER-2/neu testing.  Plan: Patient is extremely frail and has major depression issues. She does not want to undergo adjuvant radiation therapy even though this is recommended. She wants to take tamoxifen alone. She does not even want to meet with radiation oncology.  Major depression: I provided her lots of encouragement and reinforcement.  Return to clinic in 3 months for survivorship care plan visit.

## 2017-05-25 ENCOUNTER — Telehealth: Payer: Self-pay | Admitting: General Practice

## 2017-05-25 ENCOUNTER — Telehealth: Payer: Self-pay | Admitting: Nutrition

## 2017-05-25 ENCOUNTER — Encounter: Payer: Medicare Other | Admitting: Nutrition

## 2017-05-25 ENCOUNTER — Other Ambulatory Visit: Payer: Self-pay | Admitting: Adult Health

## 2017-05-25 NOTE — Telephone Encounter (Signed)
[place] CSW Progress Notes  Referral received from nutritionist due to concerns w depression and lack of desire to eat. Lives alone in senior/disability housing [facility]).  Daughter lives near [place], visits patient monthly.  Son livesin [place].  Children were supportive during recent surgery.  Wants PCP to increase her antidepressant medication.  "I have negative thoughts from the time I get up to the time I go to bed."  "I am primarily housebound, I only go out for appointments."  Past history of inpatient psychiatric hospitalization and outpatient ECT treatments.  Used to receive mental health care from [facility] "but I cant go there any more because they don't have any wheelchairs."  "I used to be a very busy person, I got sick two years ago and it hasn't been the same ever since."  Little/no support in the community.  Patient wants information on whether she has current Medicaid, DSS worker is Ms Fabio Neighbors.    Edwyna Shell, LCSW Clinical Social Worker Phone:  [phone]

## 2017-05-25 NOTE — Telephone Encounter (Signed)
Patient called wanting to ask questions about her appetite. She reports she isn't able to eat much. Appetite is poor and she suffers from depression. States MD was going to increase her antidepressants. I provided brief education on strategies for increasing appetite, calories and protein. I will mail fact sheets with my contact information. Patient was appreciative of call.

## 2017-05-26 ENCOUNTER — Other Ambulatory Visit: Payer: Self-pay | Admitting: *Deleted

## 2017-05-31 ENCOUNTER — Encounter: Payer: Self-pay | Admitting: General Practice

## 2017-05-31 NOTE — Progress Notes (Signed)
[place] CSW Progress Note  CSW spoke w patient, obtained verbal consent for referral to [employer].  Provider will contact patient and assess for services related to mental health needs.  Provider aware that patient has Medicare at this point.  Per Estate manager/land agent, patient is not current w Medicaid but can resubmit application as she has had Medicaid in the past.  CSW will continue to monitor referral, told patient CSW will recontact her next week to assess progress.  Patient has also been referred to [employer] - referral is pending at this time.  Edwyna Shell, LCSW Clinical Social Worker Phone:  [phone]

## 2017-06-01 ENCOUNTER — Encounter: Payer: Self-pay | Admitting: General Practice

## 2017-06-01 NOTE — Progress Notes (Signed)
[place] CSW Progress Notes  Call from patient, "I have changed my mind, I do not want the services we talked about yesterday."  CSW left VM asking patient to clarify which services she did not want, will cancel referrals as indicated.  Awaiting call from patient.  Edwyna Shell, LCSW Clinical Social Worker Phone:  [phone]

## 2017-06-02 ENCOUNTER — Other Ambulatory Visit: Payer: Self-pay

## 2017-06-02 NOTE — Patient Outreach (Signed)
[place] [facility]) Care Management  06/02/2017  [name] 26-Jan-1946 [id number]   MD Referral received 06/01/17 re: Linkage to Behavior health, limited support, depression.  RNCM called for screening. No answer. HIPPA compliant message left.  Plan: Follow up call next business day.  Thea Silversmith, RN, MSN, [place] Coordinator Cell: [phone]

## 2017-06-03 ENCOUNTER — Other Ambulatory Visit: Payer: Self-pay | Admitting: *Deleted

## 2017-06-03 NOTE — Patient Outreach (Addendum)
[place] [facility]) Care Management  06/03/2017  DORINA RIBAUDO 10-20-1945 [id number]  MD Referral received 06/01/17 re: Linkage to Behavior health, limited support, depression.  RNCM called for screening. No answer. HIPPA compliant message left for return call.     1600 Incoming call from patient, she states "tell me what you do exactly", verified patient identity and HIPAA information.  Explained reason for the call, patient states she is was aware Social worker at Cancer center would be contacting us. Explained  [facility] care management services, including team of RN, Social worker and pharmacist to help with managing her health care needs.     Patient states she is not sure she is interested at this time, again reinforced the benefits of [facility] care management services.  Patient again states I don't think I am interested at this time, declined moving forward with further assessment screening.  Patient agreeable to receiving contact information on [facility] services .   Plan  Will send successful outreach letter to patient Will notify assigned care manager of patient declining [facility] services at this time. Will send CMA in basket message of patient case status not active.   Joylene Draft, RN, [place] Management Coordinator  [phone]- Mobile [phone]- Toll Free Main Office

## 2017-06-06 ENCOUNTER — Encounter: Payer: Self-pay | Admitting: *Deleted

## 2017-06-08 ENCOUNTER — Other Ambulatory Visit: Payer: Self-pay | Admitting: Hematology and Oncology

## 2017-06-17 ENCOUNTER — Encounter: Payer: Self-pay | Admitting: General Practice

## 2017-06-17 NOTE — Progress Notes (Signed)
[place] CSW Progress Notes  CSW received referral from dietician B Neff to reach out to patient due to isolation, behavioral health needs and resources needs.  CSW spoke w patient, confirmed she wanted referral to [employer] for help w accessing mental health care and [place] for community case management.  Patient later called back, said she did not want [facility] referral.  Referral withdrawn w agency.  Per chart review, patient has also declined [facility] services.  CSW signing off at this time, please reconsult if further needs arise.  Edwyna Shell, LCSW Clinical Social Worker Phone:  [phone]

## 2017-08-17 ENCOUNTER — Telehealth: Payer: Self-pay | Admitting: Adult Health

## 2017-08-17 NOTE — Telephone Encounter (Signed)
Pt called to cancel her appt scheduled with Ria Comment on 5/21.

## 2017-08-23 ENCOUNTER — Encounter: Payer: Medicare Other | Admitting: Adult Health

## 2017-09-16 ENCOUNTER — Other Ambulatory Visit: Payer: Self-pay | Admitting: *Deleted

## 2017-09-16 ENCOUNTER — Other Ambulatory Visit: Payer: Self-pay | Admitting: Hematology and Oncology

## 2017-09-29 ENCOUNTER — Other Ambulatory Visit: Payer: Self-pay | Admitting: Hematology and Oncology

## 2017-10-03 ENCOUNTER — Telehealth: Payer: Self-pay

## 2017-10-03 NOTE — Telephone Encounter (Signed)
Returned pt's call regarding her phenergan refill request.  Noted there is also a phenergan refill request from [facility].  No answer, left VM.  Per Feb 2019 notes, Dr Lindi Adie recommended f/u in 3 months, pt cancelled appt in May.  Let pt know in VM that she needs to call nurse back so we can reschedule her f/u appt and then can refill Phenergan.

## 2017-10-04 ENCOUNTER — Ambulatory Visit: Payer: Medicare Other | Admitting: Family Medicine

## 2017-11-24 ENCOUNTER — Encounter (HOSPITAL_COMMUNITY): Payer: Self-pay

## 2017-11-24 ENCOUNTER — Other Ambulatory Visit: Payer: Self-pay

## 2017-11-24 ENCOUNTER — Ambulatory Visit (HOSPITAL_COMMUNITY)
Admission: EM | Admit: 2017-11-24 | Discharge: 2017-11-24 | Disposition: A | Discharge status: Home / Self Care | Payer: Medicare Other | Attending: Family Medicine | Admitting: Family Medicine

## 2017-11-24 DIAGNOSIS — M79604 Pain in right leg: Secondary | ICD-10-CM

## 2017-11-24 DIAGNOSIS — R1033 Periumbilical pain: Secondary | ICD-10-CM | POA: Diagnosis not present

## 2017-11-24 LAB — POCT I-STAT, CHEM 8
BUN: 14 mg/dL (ref 8–23)
Calcium, Ion: 1.13 mmol/L — ABNORMAL LOW (ref 1.15–1.40)
Chloride: 106 mmol/L (ref 98–111)
Creatinine, Ser: 1 mg/dL (ref 0.44–1.00)
Glucose, Bld: 92 mg/dL (ref 70–99)
HCT: 43 % (ref 36.0–46.0)
Hemoglobin: 14.6 g/dL (ref 12.0–15.0)
Potassium: 4.1 mmol/L (ref 3.5–5.1)
Sodium: 142 mmol/L (ref 135–145)
TCO2: 26 mmol/L (ref 22–32)

## 2017-11-24 MED ORDER — PROMETHAZINE HCL 25 MG PO TABS: 25.0000 mg | ORAL_TABLET | Freq: Four times a day (QID) | ORAL | 2 refills | Status: DC | PRN

## 2017-11-24 NOTE — ED Triage Notes (Signed)
Abdominal pain and right leg pain months . Pt thinks she may have a lump in her stomach. ( she feels a lump in her stomach.

## 2017-11-24 NOTE — Discharge Instructions (Addendum)
It was nice meeting you!!  I will refill your phenergan.  You need to follow up with your Primary care provider about the hernia, your constant nausea and lack of appetite.  You may need a CT scan in the near future.

## 2017-11-25 NOTE — ED Provider Notes (Signed)
[place]    CSN: [id number] Arrival date & time: 11/24/17  1642     History   Chief Complaint Chief Complaint  Patient presents with  . Abdominal Pain  . Leg Pain    HPI [name] is a 72 y.o. female.   Pt is a 72 year old female with PMH of 41, arthritis, breast cancer, COPD, depression, diverticulosis, GERD, uterine prolapse, hypertension, hypothyroidism, OCD, osteoporosis, PUD thyroid disease.  She presents with periumbilical pain.  This is a chronic issue.  She has a history of umbilical hernia.  She reports that it is gotten more painful and larger.  Reports that she has constant nausea and takes vinegar every day for this.  She only eats once a day and uses crackers.  She does not drink much fluids.  This is all chronic issue has been ongoing for years.  She had breast cancer back in February where she had a left mastectomy.  Reports that she remains depressed most the time.  She denies any suicidal ideations today.  She has not had any vomiting or diarrhea.  She is requesting to have some blood work done.  She is scheduled to see her PCP on Monday. She denies any urinary symptoms.   She is still smoking.  PMH and family hx reviewed.   ROS per HPI      Past Medical History:  Diagnosis Date  . Anxiety   . Arthritis   . Cancer [facility])    breast cancer  . COPD (chronic obstructive pulmonary disease) (HCC)    Due to long history of tobacco abuse, still smoking  . Depression   . Diverticulosis of colon   . GERD (gastroesophageal reflux disease)   . Heart murmur    not sure never been evaluated  . History of uterine prolapse 10/2004   Dr Cletis Media  . Hypertension    not on any medications  . Hypothyroidism   . OCD (obsessive compulsive disorder)   . Osteoporosis   . PUD (peptic ulcer disease) 09/2006   H pylori  . Thyroid disease    Ho R thyroid noduel plus mild hyperthyroidism. Treated with radioiodine therapty on 04/2006. Dr Estrella Myrtle.    Patient  Active Problem List   Diagnosis Date Noted  . Cancer of left breast, stage 1, estrogen receptor positive ([place]) 05/13/2017  . Malignant neoplasm of overlapping sites of left breast in female, estrogen receptor positive ([place]) 03/08/2017  . Sinus pain 03/02/2017  . Callus of foot 11/20/2016  . Loss of appetite 12/29/2015  . Fatigue 12/29/2015  . Bipolar affective disorder, currently depressed, moderate ([place]) 11/26/2015  . Severe episode of recurrent major depressive disorder, without psychotic features ([place])   . Foreign body in foot, right 01/09/2015  . Blister of right ankle without infection 01/09/2015  . Sinusitis 11/26/2014  . Rotator cuff syndrome of right shoulder 11/14/2014  . Varicose veins of both lower extremities with pain 10/29/2014  . Neck pain 09/26/2014  . Metatarsal fracture 08/16/2014  . Pain in joint, shoulder region 08/16/2014  . UTI (urinary tract infection) 08/16/2014  . Facial skin lesion 08/01/2014  . Leg edema, left 07/25/2014  . Edema of left lower extremity 07/22/2014  . Essential hypertension   . Thyroid activity decreased   . HLD (hyperlipidemia)   . Healthcare maintenance 05/31/2014  . Emotional neurotic disorder 05/07/2014  . Seborrheic keratosis 12/05/2013  . 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 Mass of left side of neck  10/17/2012  . At high risk for falls 06/28/2012  . Allergic rhinitis 06/16/2012  . Mass of left breast 10/27/2011  . Hearing loss 01/07/2011  . Foot pain 11/25/2010  . Thyroid nodule 10/16/2010  . Nausea 09/22/2010  . URINARY INCONTINENCE, STRESS, FEMALE 04/03/2010  . HYPERLIPIDEMIA 03/25/2010  . PULMONARY NODULE 12/23/2009  . COPD (chronic obstructive pulmonary disease) ([place]) 12/03/2009  . Tobacco abuse counseling 11/05/2009  . CVA 11/05/2009  . BIPOLAR DISORDER UNSPECIFIED 11/28/2008  . HYPOTHYROIDISM 05/06/2008  . OCD (obsessive compulsive disorder) 10/13/2006  . GERD 10/13/2006  . OSTEOPOROSIS 10/13/2006     Past Surgical History:  Procedure Laterality Date  . ABDOMINAL HYSTERECTOMY    . BIOPSY THYROID  08/2008   Atypia and Hurtle cells, partial thyroidectomy   . BLADDER SUSPENSION     mesh sling  . Carotid ultrasound  09/10/09   No significant extracranial carotid artery stenosis, vertebrals are patent with antegrade flow.   [name] CATARACT EXTRACTION Right   . COLONOSCOPY  06/03/2005   Hyperplastic polyps, sigmoid diverticulosis, internal hemorroids  . EYE SURGERY     laser surgery does not know which eye  . FOREIGN BODY REMOVAL Right 02/21/2015   Procedure: RIGHT FOOT FOREIGN BODY REMOVAL ;  Surgeon: Marchia Bond, MD;  Location: [place];  Service: Orthopedics;  Laterality: Right;  . MASTECTOMY COMPLETE / SIMPLE W/ SENTINEL NODE BIOPSY Left 05/13/2017  . MASTECTOMY W/ SENTINEL NODE BIOPSY Left 05/13/2017   Procedure: LEFT TOTAL MASTECTOMY WITH AXILLARY SENTINEL LYMPH NODE BIOPSY;  Surgeon: Excell Seltzer, MD;  Location: [place];  Service: General;  Laterality: Left;  . MRI Head  09/20/09   No acute intracranial abn.  Signal abnormality suggestive of chronic small vessel ischemia, maximal in the right subinsular white and deep gray mater.   [name] PFT  11/2006   Obstructive pattern.  Poor response to bronchodilators.   . THYROIDECTOMY, PARTIAL  08/2008   Dr Ronnald Collum  . TONSILLECTOMY AND ADENOIDECTOMY  10/2004  . TRANSTHORACIC ECHOCARDIOGRAM  12/08/7074   Normal systolic function.  EF 55-60%.  Mild MR, atria ok, trivial pericardial effusion  . TRANSVAGINAL TAPE (TVT) REMOVAL  06/15/2010   Dr Mancel Bale, for urge incontinence    OB History    Gravida  2   Para      Term      Preterm      AB      Living  2     SAB      TAB      Ectopic      Multiple      Live Births               Home Medications    Prior to Admission medications   Medication Sig Start Date End Date Taking? Authorizing Provider  aspirin 325 MG tablet Take 325 mg by mouth daily.     [provider]  escitalopram (LEXAPRO) 10 MG tablet Take 2 tablets (20 mg total) by mouth daily. 05/24/17   Nicholas Lose, MD  levothyroxine (SYNTHROID, LEVOTHROID) 125 MCG tablet Take 1 tablet (125 mcg total) by mouth daily before breakfast. Patient not taking: Reported on 11/24/2017 02/28/17   Rogue Bussing, MD  loperamide (IMODIUM) 2 MG capsule Take by mouth as needed for diarrhea or loose stools (as needed for diarrhea).    [provider]  promethazine (PHENERGAN) 25 MG tablet Take 1 tablet (25 mg total) by mouth every 6 (six) hours as needed  for nausea or vomiting. 11/24/17   Loura Halt A, NP  tamoxifen (NOLVADEX) 20 MG tablet Take 1 tablet (20 mg total) by mouth daily. 05/24/17   Nicholas Lose, MD  traZODone (DESYREL) 50 MG tablet Take 25 mg by mouth at bedtime as needed for sleep.    [provider]    Family History Family History  Problem Relation Age of Onset  . Heart disease Mother   . Stomach cancer Mother   . Cirrhosis Sister   . Hypertension Sister   . Heart disease Maternal Grandmother   . Heart disease Maternal Grandfather   . Colon cancer Maternal Aunt   . Other Neg Hx     Social History Social History   Tobacco Use  . Smoking status: Former Smoker    Packs/day: 0.75    Years: 40.00    Pack years: 30.00    Types: Cigarettes    Last attempt to quit: 11/2016    Years since quitting: 1.0  . Smokeless tobacco: Never Used  Substance Use Topics  . Alcohol use: No    Alcohol/week: 0.0 standard drinks  . Drug use: No     Allergies   Seroquel [quetiapine fumarate]; Amoxicillin; Ciprofloxacin; Haloperidol lactate; Ketorolac tromethamine; Latuda [lurasidone hcl]; Thorazine [chlorpromazine]; and Wellbutrin [bupropion]   Review of Systems Review of Systems   Physical Exam Triage Vital Signs ED Triage Vitals  Enc Vitals Group     BP 11/24/17 1701 (!) 154/96     Pulse Rate 11/24/17 1654 94     Resp 11/24/17 1654 16      Temp 11/24/17 1654 97.6 F (36.4 C)     Temp Source 11/24/17 1654 Oral     SpO2 11/24/17 1654 100 %     Weight 11/24/17 1657 96 lb 9.6 oz (43.8 kg)     Height --      Head Circumference --      Peak Flow --      Pain Score --      Pain Loc --      Pain Edu? --      Excl. in [place]? --    No data found.  Updated Vital Signs BP (!) 154/96   Pulse 94   Temp 97.6 F (36.4 C) (Oral)   Resp 16   Wt 96 lb 9.6 oz (43.8 kg)   LMP 01/09/2012   SpO2 100%   BMI 22.45 kg/m   Visual Acuity Right Eye Distance:   Left Eye Distance:   Bilateral Distance:    Right Eye Near:   Left Eye Near:    Bilateral Near:     Physical Exam  Constitutional:  Non-toxic appearance. She does not appear ill.  Very unkempt and emaciated. She is wearing a nightgown and her shoes have tape on them.   HENT:  Head: Normocephalic and atraumatic.  Mouth/Throat: Oropharynx is clear and moist.  Abdominal: Soft. Bowel sounds are normal. A hernia is present.  Hernia to umbilicus. Able to push back into abdomen.  More  prominent with sitting.  Nontender to touch.  No erythema.   Neurological: She is alert.  Skin: Skin is warm. There is pallor.  Psychiatric: She expresses inappropriate judgment. She exhibits a depressed mood.  Pt repeating herself asking lots of questions about her health. She is speaking about things that have been a problem for 3 years. She is asking me to check her "metabolites and blood for cancer". She walked out after her blood was  drawn saying she would wait in the waiting area.   Nursing note and vitals reviewed.    UC Treatments / Results  Labs (all labs ordered are listed, but only abnormal results are displayed) Labs Reviewed  POCT I-STAT, CHEM 8 - Abnormal; Notable for the following components:      Result Value   Calcium, Ion 1.13 (*)    All other components within normal limits    EKG None  Radiology No results found.  Procedures Procedures (including critical care  time)  Medications Ordered in UC Medications - No data to display  Initial Impression / Assessment and Plan / UC Course  I have reviewed the triage vital signs and the nursing notes.  Pertinent labs & imaging results that were available during my care of the patient were reviewed by me and considered in my medical decision making (see chart for details).    Non toxic or ill appearing. Very unkempt. Thoughts unorganized. Depressed but denies SI. I stat normal.  Pt here for chronic issues.  No concern for incarcerated hernia.  Will have her follow up with her PCP on Monday as scheduled.  Refilled her phenergan as requested.  Final Clinical Impressions(s) / UC Diagnoses   Final diagnoses:  Periumbilical abdominal pain     Discharge Instructions     It was nice meeting you!!  I will refill your phenergan.  You need to follow up with your Primary care provider about the hernia, your constant nausea and lack of appetite.  You may need a CT scan in the near future.       ED Prescriptions    Medication Sig Dispense Auth. Provider   promethazine (PHENERGAN) 25 MG tablet Take 1 tablet (25 mg total) by mouth every 6 (six) hours as needed for nausea or vomiting. 30 tablet Loura Halt A, NP     Controlled Substance Prescriptions [place] Controlled Substance Registry consulted? Not Applicable   Orvan July, NP 11/25/17 1556

## 2017-11-28 ENCOUNTER — Ambulatory Visit (INDEPENDENT_AMBULATORY_CARE_PROVIDER_SITE_OTHER): Payer: Medicare Other | Admitting: Family Medicine

## 2017-11-28 ENCOUNTER — Other Ambulatory Visit: Payer: Self-pay

## 2017-11-28 ENCOUNTER — Encounter: Payer: Self-pay | Admitting: Family Medicine

## 2017-11-28 VITALS — BP 130/80 | HR 84 | Temp 97.8°F | Wt 95.0 lb

## 2017-11-28 DIAGNOSIS — K219 Gastro-esophageal reflux disease without esophagitis: Secondary | ICD-10-CM

## 2017-11-28 DIAGNOSIS — E039 Hypothyroidism, unspecified: Secondary | ICD-10-CM

## 2017-11-28 MED ORDER — RANITIDINE HCL 75 MG PO TABS: 75.0000 mg | ORAL_TABLET | Freq: Two times a day (BID) | ORAL | 3 refills | Status: DC

## 2017-11-28 NOTE — Patient Instructions (Addendum)
It was good to see you today!  For your hernia, - No need for surgery. Everything looks okay.  For your nausea - Try decreasing your caffeine - acid reflux ranitidine twice a day - nutrition shakes to supplement your intake   Please come to your geriatric appointment on 12/29/17.  Please bring all of your medications with you to each visit.   Sign up for My Chart to have easy access to your labs results, and communication with your primary care physician.  Feel free to call with any questions or concerns at any time, at [phone].   Take care,  Dr. Bufford Lope, [place]

## 2017-11-28 NOTE — Assessment & Plan Note (Signed)
Uncertain etiology has been nausea however given her midepigastric discomfort likely that she may have some subtle symptoms of acid reflux.  She has no red flags on history or exam as she is able to swallow food and liquids well, has no vomiting, and her belly exam is benign.  Her small abdominal hernia is easily reducible and unlikely to be responsible for her symptoms.  Discussed decreasing her caffeine intake as she is reluctant to try medications.  If this does not work patient will try H2 blocker for a couple of weeks.  Would not do PPI as patient has memory concerns over the age of 56.

## 2017-11-28 NOTE — Assessment & Plan Note (Signed)
Noncompliant on her home Synthroid.  Discussed with patient that this may be the reason that she feels chronically fatigued and not well.  She is to go back on her home Synthroid consistently for the next 1 month before rechecking a TSH level.

## 2017-11-28 NOTE — Progress Notes (Signed)
Subjective:  [name] is a 72 y.o. female who presents to the [facility] today with complaints including nausea, memory, mood, fatigue, right hip pain.   HPI:  Biggest problem is her nausea Has been dealing with ongoing nausea for the last 1 year.  This started just before her breast cancer diagnosis last September.  Feels like food is getting stuck in the mid epigastric area.  She does not have any trouble swallowing solids or liquids.  She does not have any symptoms of burning in her chest or sour taste in her throat.  No vomiting.  Has a poor appetite because food does not taste good for her she feels tired all the time No abdominal pain.  She does recall showing an area on her abdomen to a different doctor about a year ago who told her that she had a hernia.  She wants to know she needs imaging for this. She drinks a lot of unsweetened tea throughout the day No late meals or spicy or greasy foods  Has been tired for 3 years.  Feels like she has memory problems on top of this, big concern for this is that her father had Alzheimer's.  She is not getting lost or forgetting important things. She is not taking her thyroid medication has not for some time because it made her feel awful when she was on it.  Has chronic right hip pain.  No falls    ROS: Per HPI  PMH: She reports that she quit smoking about 12 months ago. Her smoking use included cigarettes. She has a 30.00 pack-year smoking history. She has never used smokeless tobacco. She reports that she does not drink alcohol or use drugs.   Objective:  Physical Exam: BP 130/80   Pulse 84   Temp 97.8 F (36.6 C) (Oral)   Wt 95 lb (43.1 kg)   LMP 01/09/2012   SpO2 92%   BMI 22.08 kg/m   Gen: NAD, resting comfortably Pulm: NWOB on room air GI: Normal bowel sounds present. Soft, Nontender, Nondistended.  Small easily reducible hernia just above the umbilicus MSK: no edema, cyanosis, or clubbing noted Skin: warm, dry Neuro:  grossly normal, moves all extremities Psych: Normal affect and thought content   Assessment/Plan:  GERD Uncertain etiology has been nausea however given her midepigastric discomfort likely that she may have some subtle symptoms of acid reflux.  She has no red flags on history or exam as she is able to swallow food and liquids well, has no vomiting, and her belly exam is benign.  Her small abdominal hernia is easily reducible and unlikely to be responsible for her symptoms.  Discussed decreasing her caffeine intake as she is reluctant to try medications.  If this does not work patient will try H2 blocker for a couple of weeks.  Would not do PPI as patient has memory concerns over the age of 67.  Hypothyroidism Noncompliant on her home Synthroid.  Discussed with patient that this may be the reason that she feels chronically fatigued and not well.  She is to go back on her home Synthroid consistently for the next 1 month before rechecking a TSH level.  Patient per chart review has been referred to geriatric clinic given her multiple concerns today agree that this would be a good opportunity to assess her memory concerns.  Patient agreeable to discussing her right leg pain at that time   Bufford Lope, DO PGY-3, [place]  11/28/2017 2:33 PM

## 2017-12-13 ENCOUNTER — Encounter (HOSPITAL_COMMUNITY): Payer: Self-pay | Admitting: Emergency Medicine

## 2017-12-13 ENCOUNTER — Other Ambulatory Visit: Payer: Self-pay

## 2017-12-13 ENCOUNTER — Ambulatory Visit (HOSPITAL_COMMUNITY)
Admission: EM | Admit: 2017-12-13 | Discharge: 2017-12-13 | Disposition: A | Discharge status: Home / Self Care | Payer: Medicare Other | Attending: Family Medicine | Admitting: Family Medicine

## 2017-12-13 ENCOUNTER — Ambulatory Visit (INDEPENDENT_AMBULATORY_CARE_PROVIDER_SITE_OTHER): Payer: Medicare Other

## 2017-12-13 DIAGNOSIS — R102 Pelvic and perineal pain: Secondary | ICD-10-CM | POA: Diagnosis not present

## 2017-12-13 DIAGNOSIS — H1013 Acute atopic conjunctivitis, bilateral: Secondary | ICD-10-CM | POA: Diagnosis not present

## 2017-12-13 DIAGNOSIS — M25559 Pain in unspecified hip: Secondary | ICD-10-CM | POA: Diagnosis not present

## 2017-12-13 MED ORDER — OLOPATADINE HCL 0.2 % OP SOLN: 1.0000 [drp] | Freq: Every day | OPHTHALMIC | 0 refills | Status: DC

## 2017-12-13 NOTE — ED Triage Notes (Signed)
Pt reports bilateral eye issues, feeling like she has something in them.  She also reports feeling like she has a bug in her right ear.  Pt is also having bilateral upper leg pain that she thinks is from jumping out of a moving truck. x1 month

## 2017-12-13 NOTE — Discharge Instructions (Signed)
You may take Tylenol as needed.
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
## 2017-12-18 ENCOUNTER — Ambulatory Visit (HOSPITAL_COMMUNITY)
Admission: EM | Admit: 2017-12-18 | Discharge: 2017-12-18 | Disposition: A | Discharge status: Home / Self Care | Payer: Medicare Other | Attending: Family Medicine | Admitting: Family Medicine

## 2017-12-18 ENCOUNTER — Encounter (HOSPITAL_COMMUNITY): Payer: Self-pay | Admitting: *Deleted

## 2017-12-18 DIAGNOSIS — M5431 Sciatica, right side: Secondary | ICD-10-CM

## 2017-12-18 DIAGNOSIS — M5432 Sciatica, left side: Secondary | ICD-10-CM | POA: Diagnosis not present

## 2017-12-18 MED ORDER — CYCLOBENZAPRINE HCL 5 MG PO TABS: 5.0000 mg | ORAL_TABLET | Freq: Two times a day (BID) | ORAL | 0 refills | Status: DC | PRN

## 2017-12-18 MED ORDER — TRAMADOL HCL 50 MG PO TABS: 50.0000 mg | ORAL_TABLET | Freq: Four times a day (QID) | ORAL | 0 refills | Status: DC | PRN

## 2017-12-18 MED ORDER — METHYLPREDNISOLONE SODIUM SUCC 125 MG IJ SOLR
INTRAMUSCULAR | Status: AC
  Filled 2017-12-18: qty 2

## 2017-12-18 MED ORDER — METHYLPREDNISOLONE SODIUM SUCC 125 MG IJ SOLR
62.5000 mg | Freq: Once | INTRAMUSCULAR | Status: AC
  Administered 2017-12-18: 62.5 mg via INTRAMUSCULAR

## 2017-12-18 NOTE — Discharge Instructions (Signed)
Keep follow-up with primary. You may benefit significantly by having an evaluation with orthopedics for persistent leg pain.

## 2017-12-18 NOTE — ED Provider Notes (Signed)
[place]   CSN: [id number] Arrival date & time: 12/18/17  1231  History   Chief Complaint Chief Complaint  Patient presents with  . Leg Pain   HPI [name] is a 72 y.o. female.  Patient last seen here at [facility] urgent care on 12/13/2017 with similar complaint of bilateral lower leg pain. Today she presents complaining only of left upper leg pain.  Recent imaging was significant for possible degenerative changes in the lower lumbar spine. Today she just complains of burning radiating pain down her left hip and thigh area.  She denies any injury or recent falls.  Over-the-counter ibuprofen and Tylenol without any significant relief of pain symptoms.  She has a follow-up scheduled with her primary care on 12/29/2017 for further evaluation of issue.  Reports pain today so severe that she cannot wait until schedule PCP visit. Past Medical History:  Diagnosis Date  . Anxiety   . Arthritis   . Cancer [facility])    breast cancer  . COPD (chronic obstructive pulmonary disease) (HCC)    Due to long history of tobacco abuse, still smoking  . Depression   . Diverticulosis of colon   . GERD (gastroesophageal reflux disease)   . Heart murmur    not sure never been evaluated  . History of uterine prolapse 10/2004   Dr Cletis Media  . Hypertension    not on any medications  . Hypothyroidism   . OCD (obsessive compulsive disorder)   . Osteoporosis   . PUD (peptic ulcer disease) 09/2006   H pylori  . Thyroid disease    Ho R thyroid noduel plus mild hyperthyroidism. Treated with radioiodine therapty on 04/2006. Dr Estrella Myrtle.    Patient Active Problem List   Diagnosis Date Noted  . Cancer of left breast, stage 1, estrogen receptor positive ([place]) 05/13/2017  . Malignant neoplasm of overlapping sites of left breast in female, estrogen receptor positive ([place]) 03/08/2017  . Sinus pain 03/02/2017  . Callus of foot 11/20/2016  . Loss of appetite 12/29/2015  . Fatigue 12/29/2015  . Bipolar  affective disorder, currently depressed, moderate ([place]) 11/26/2015  . Severe episode of recurrent major depressive disorder, without psychotic features ([place])   . Foreign body in foot, right 01/09/2015  . Blister of right ankle without infection 01/09/2015  . Sinusitis 11/26/2014  . Rotator cuff syndrome of right shoulder 11/14/2014  . Varicose veins of both lower extremities with pain 10/29/2014  . Neck pain 09/26/2014  . Metatarsal fracture 08/16/2014  . Pain in joint, shoulder region 08/16/2014  . Facial skin lesion 08/01/2014  . Leg edema, left 07/25/2014  . Edema of left lower extremity 07/22/2014  . Essential hypertension   . Thyroid activity decreased   . HLD (hyperlipidemia)   . Healthcare maintenance 05/31/2014  . Emotional neurotic disorder 05/07/2014  . Seborrheic keratosis 12/05/2013  . 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 Mass of left side of neck 10/17/2012  . At high risk for falls 06/28/2012  . Allergic rhinitis 06/16/2012  . Mass of left breast 10/27/2011  . Hearing loss 01/07/2011  . Foot pain 11/25/2010  . Thyroid nodule 10/16/2010  . Nausea 09/22/2010  . URINARY INCONTINENCE, STRESS, FEMALE 04/03/2010  . HYPERLIPIDEMIA 03/25/2010  . PULMONARY NODULE 12/23/2009  . COPD (chronic obstructive pulmonary disease) ([place]) 12/03/2009  . Tobacco abuse counseling 11/05/2009  . CVA 11/05/2009  . BIPOLAR DISORDER UNSPECIFIED 11/28/2008  . Hypothyroidism 05/06/2008  . OCD (obsessive compulsive disorder) 10/13/2006  .  GERD 10/13/2006  . OSTEOPOROSIS 10/13/2006    Past Surgical History:  Procedure Laterality Date  . ABDOMINAL HYSTERECTOMY    . BIOPSY THYROID  08/2008   Atypia and Hurtle cells, partial thyroidectomy   . BLADDER SUSPENSION     mesh sling  . Carotid ultrasound  09/10/09   No significant extracranial carotid artery stenosis, vertebrals are patent with antegrade flow.   [name] CATARACT EXTRACTION Right   . COLONOSCOPY  06/03/2005   Hyperplastic polyps,  sigmoid diverticulosis, internal hemorroids  . EYE SURGERY     laser surgery does not know which eye  . FOREIGN BODY REMOVAL Right 02/21/2015   Procedure: RIGHT FOOT FOREIGN BODY REMOVAL ;  Surgeon: Marchia Bond, MD;  Location: [place];  Service: Orthopedics;  Laterality: Right;  . MASTECTOMY COMPLETE / SIMPLE W/ SENTINEL NODE BIOPSY Left 05/13/2017  . MASTECTOMY W/ SENTINEL NODE BIOPSY Left 05/13/2017   Procedure: LEFT TOTAL MASTECTOMY WITH AXILLARY SENTINEL LYMPH NODE BIOPSY;  Surgeon: Excell Seltzer, MD;  Location: [place];  Service: General;  Laterality: Left;  . MRI Head  09/20/09   No acute intracranial abn.  Signal abnormality suggestive of chronic small vessel ischemia, maximal in the right subinsular white and deep gray mater.   [name] PFT  11/2006   Obstructive pattern.  Poor response to bronchodilators.   . THYROIDECTOMY, PARTIAL  08/2008   Dr Ronnald Collum  . TONSILLECTOMY AND ADENOIDECTOMY  10/2004  . TRANSTHORACIC ECHOCARDIOGRAM  06/11/1015   Normal systolic function.  EF 55-60%.  Mild MR, atria ok, trivial pericardial effusion  . TRANSVAGINAL TAPE (TVT) REMOVAL  06/15/2010   Dr Mancel Bale, for urge incontinence    OB History    Gravida  2   Para      Term      Preterm      AB      Living  2     SAB      TAB      Ectopic      Multiple      Live Births               Home Medications    Prior to Admission medications   Medication Sig Start Date End Date Taking? Authorizing Provider  aspirin 325 MG tablet Take 325 mg by mouth daily.   Yes [provider]  escitalopram (LEXAPRO) 10 MG tablet Take 2 tablets (20 mg total) by mouth daily. 05/24/17  Yes Nicholas Lose, MD  tamoxifen (NOLVADEX) 20 MG tablet Take 1 tablet (20 mg total) by mouth daily. 05/24/17  Yes Nicholas Lose, MD  loperamide (IMODIUM) 2 MG capsule Take by mouth as needed for diarrhea or loose stools (as needed for diarrhea).    [provider]  Olopatadine HCl 0.2 %  SOLN Apply 1 drop to eye daily. 12/13/17   Vanessa Kick, MD  traZODone (DESYREL) 50 MG tablet Take 25 mg by mouth at bedtime as needed for sleep.    [provider]    Family History Family History  Problem Relation Age of Onset  . Heart disease Mother   . Stomach cancer Mother   . Cirrhosis Sister   . Hypertension Sister   . Heart disease Maternal Grandmother   . Heart disease Maternal Grandfather   . Colon cancer Maternal Aunt   . Other Neg Hx     Social History Social History   Tobacco Use  . Smoking status: Former Smoker    Packs/day:  0.75    Years: 40.00    Pack years: 30.00    Types: Cigarettes    Last attempt to quit: 11/2016    Years since quitting: 1.1  . Smokeless tobacco: Never Used  Substance Use Topics  . Alcohol use: No    Alcohol/week: 0.0 standard drinks  . Drug use: No     Allergies   Seroquel [quetiapine fumarate]; Amoxicillin; Ciprofloxacin; Haloperidol lactate; Ketorolac tromethamine; Latuda [lurasidone hcl]; Thorazine [chlorpromazine]; and Wellbutrin [bupropion]   Review of Systems Review of Systems Pertinent negatives listed in HPI Physical Exam Triage Vital Signs ED Triage Vitals  Enc Vitals Group     BP 12/18/17 1255 111/89     Pulse Rate 12/18/17 1255 80     Resp 12/18/17 1255 18     Temp 12/18/17 1255 98 F (36.7 C)     Temp src --      SpO2 12/18/17 1255 100 %     Weight --      Height --      Head Circumference --      Peak Flow --      Pain Score 12/18/17 1256 10     Pain Loc --      Pain Edu? --      Excl. in [place]? --    No data found.  Updated Vital Signs BP 111/89   Pulse 80   Temp 98 F (36.7 C)   Resp 18   LMP 01/09/2012   SpO2 100%   Visual Acuity Right Eye Distance:   Left Eye Distance:   Bilateral Distance:    Right Eye Near:   Left Eye Near:    Bilateral Near:     Physical Exam General appearance: alert, well developed, well nourished, cooperative and in no distress Head: Normocephalic,  without obvious abnormality, atraumatic Respiratory: Respirations even and unlabored, normal respiratory rate Extremities: No gross deformities, although exam limited due to patient's level of pain.  Skin: Skin color, texture, turgor normal. No rashes seen  Psych: Appropriate mood and affect. Neurologic: Mental status: Alert, oriented to person, place, and time, thought content appropriate. UC Treatments / Results  Labs (all labs ordered are listed, but only abnormal results are displayed) Labs Reviewed - No data to display  EKG None  Radiology No results found.  Procedures Procedures (including critical care time)  Medications Ordered in UC Medications  methylPREDNISolone sodium succinate (SOLU-MEDROL) 125 mg/2 mL injection 62.5 mg (62.5 mg Intramuscular Given 12/18/17 1346)    Initial Impression / Assessment and Plan / UC Course  I have reviewed the triage vital signs and the nursing notes.  Pertinent labs & imaging results that were available during my care of the patient were reviewed by me and considered in my medical decision making (see chart for details).  Patient given methylprednisone 62.5 milligrams one-time dose of IM to reduce inflammation which is likely attributing to leg pain.  Will prescribe a short course of tramadol 50 mg as needed over the course of the next few days for moderate to severe pain.  Patient advised to use cyclobenzaprine first to see if this relieves her pain or to attempting tramadol.  Patient is scheduled to see her primary care provider within the coming weeks for the evaluation and management of current problem.  Advised patient that this is likely a chronic condition that certainly warrants follow-up with primary care opposed to urgent care.  Patient verbalized understanding and agreed with plan today.  Final Clinical  Impressions(s) / UC Diagnoses   Final diagnoses:  Bilateral sciatica   ED Prescriptions    Medication Sig Dispense Auth.  Provider   traMADol (ULTRAM) 50 MG tablet Take 1 tablet (50 mg total) by mouth every 6 (six) hours as needed. 15 tablet Scot Jun, FNP   cyclobenzaprine (FLEXERIL) 5 MG tablet Take 1 tablet (5 mg total) by mouth 2 (two) times daily as needed for muscle spasms. 15 tablet Scot Jun, FNP     Controlled Substance Prescriptions  Controlled Substance Registry consulted? Not Applicable   Scot Jun, FNP 12/19/17 1156

## 2017-12-18 NOTE — ED Triage Notes (Signed)
Was seen in [facility] 9/10 for leg pain.  States pain is now in bilat legs; pt states she "would like a cortisone shot or something".

## 2017-12-22 ENCOUNTER — Telehealth: Payer: Self-pay | Admitting: Family Medicine

## 2017-12-22 NOTE — Telephone Encounter (Signed)
Pt would like a referral to get an infusion for her arthritis. Pt said shes been seen at urgent cares and has has all the scans and stuff and given pain meds, but pt would like to try the infusions. Please call pt back with any questions.

## 2017-12-24 ENCOUNTER — Emergency Department (HOSPITAL_COMMUNITY)
Admission: EM | Admit: 2017-12-24 | Discharge: 2017-12-24 | Disposition: A | Discharge status: Home / Self Care | Payer: Medicare Other | Attending: Emergency Medicine | Admitting: Emergency Medicine

## 2017-12-24 ENCOUNTER — Emergency Department (HOSPITAL_COMMUNITY): Payer: Medicare Other

## 2017-12-24 ENCOUNTER — Encounter (HOSPITAL_COMMUNITY): Payer: Self-pay

## 2017-12-24 ENCOUNTER — Other Ambulatory Visit: Payer: Self-pay

## 2017-12-24 DIAGNOSIS — Z79899 Other long term (current) drug therapy: Secondary | ICD-10-CM | POA: Insufficient documentation

## 2017-12-24 DIAGNOSIS — J449 Chronic obstructive pulmonary disease, unspecified: Secondary | ICD-10-CM | POA: Diagnosis not present

## 2017-12-24 DIAGNOSIS — M5441 Lumbago with sciatica, right side: Secondary | ICD-10-CM | POA: Insufficient documentation

## 2017-12-24 DIAGNOSIS — Z87891 Personal history of nicotine dependence: Secondary | ICD-10-CM | POA: Diagnosis not present

## 2017-12-24 DIAGNOSIS — M5442 Lumbago with sciatica, left side: Secondary | ICD-10-CM | POA: Insufficient documentation

## 2017-12-24 DIAGNOSIS — M545 Low back pain: Secondary | ICD-10-CM | POA: Diagnosis not present

## 2017-12-24 DIAGNOSIS — M79605 Pain in left leg: Secondary | ICD-10-CM | POA: Diagnosis not present

## 2017-12-24 DIAGNOSIS — M5432 Sciatica, left side: Secondary | ICD-10-CM

## 2017-12-24 DIAGNOSIS — M5431 Sciatica, right side: Secondary | ICD-10-CM

## 2017-12-24 DIAGNOSIS — I10 Essential (primary) hypertension: Secondary | ICD-10-CM | POA: Insufficient documentation

## 2017-12-24 DIAGNOSIS — E785 Hyperlipidemia, unspecified: Secondary | ICD-10-CM | POA: Insufficient documentation

## 2017-12-24 DIAGNOSIS — R0902 Hypoxemia: Secondary | ICD-10-CM | POA: Diagnosis not present

## 2017-12-24 DIAGNOSIS — Z853 Personal history of malignant neoplasm of breast: Secondary | ICD-10-CM | POA: Insufficient documentation

## 2017-12-24 DIAGNOSIS — E039 Hypothyroidism, unspecified: Secondary | ICD-10-CM | POA: Insufficient documentation

## 2017-12-24 DIAGNOSIS — R52 Pain, unspecified: Secondary | ICD-10-CM | POA: Diagnosis not present

## 2017-12-24 DIAGNOSIS — Z7982 Long term (current) use of aspirin: Secondary | ICD-10-CM | POA: Insufficient documentation

## 2017-12-24 MED ORDER — HYDROCODONE-ACETAMINOPHEN 5-325 MG PO TABS
1.0000 | ORAL_TABLET | Freq: Once | ORAL | Status: AC
  Administered 2017-12-24: 1 via ORAL
  Filled 2017-12-24: qty 1

## 2017-12-24 MED ORDER — PREDNISONE 50 MG PO TABS: 50.0000 mg | ORAL_TABLET | Freq: Every day | ORAL | 0 refills | Status: DC

## 2017-12-24 MED ORDER — ACETAMINOPHEN-CODEINE 300-30 MG PO TABS: 1.0000 | ORAL_TABLET | Freq: Four times a day (QID) | ORAL | 0 refills | Status: DC | PRN

## 2017-12-24 MED ORDER — TRAMADOL HCL 50 MG PO TABS
50.0000 mg | ORAL_TABLET | Freq: Once | ORAL | Status: AC
  Administered 2017-12-24: 50 mg via ORAL
  Filled 2017-12-24: qty 1

## 2017-12-24 NOTE — Discharge Instructions (Addendum)
Return here as needed.  Follow-up with your doctor as soon as possible.  Your x-rays do not show any significant abnormalities other than degenerative changes in your lower back which is the equivalent of arthritis.

## 2017-12-24 NOTE — ED Notes (Signed)
Bed: WA06 Expected date: 12/24/17 Expected time: 4:41 PM Means of arrival: Ambulance Comments: Bil leg pain

## 2017-12-24 NOTE — ED Triage Notes (Signed)
Per EMS: Pt c/o of sciatic pain beginning 24 hours ago.  Pt seen at [facility] yesterday and discharged with trazodone.  Pt states that medicine isn't working.
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
## 2017-12-24 NOTE — ED Notes (Signed)
Patient in xray 

## 2017-12-24 NOTE — ED Provider Notes (Signed)
[facility] DEPT Provider Note   CSN: [id number] Arrival date & time: 12/24/17  1639     History   Chief Complaint Chief Complaint  Patient presents with  . Back Pain    HPI [name] is a 72 y.o. female.  HPI Patient presents to the emergency department with lower back pain that radiates into both legs.  Patient states that she has been seen for this recently and has an appointment with her doctor.  The patient states that the pain got worse tonight and she was prescribed tramadol which is not helping.  Patient states that certain movements palpation make the pain worse.  Patient states she does use a walker but has not been using it recently.  The patient states that she did not take any other medications prior to arrival for symptoms.  The patient denies chest pain, shortness of breath, headache,blurred vision, neck pain, fever, cough, weakness, numbness, dizziness, anorexia, edema, abdominal pain, nausea, vomiting, diarrhea, rash, dysuria, hematemesis, bloody stool, near syncope, or syncope. Past Medical History:  Diagnosis Date  . Anxiety   . Arthritis   . Cancer [facility])    breast cancer  . COPD (chronic obstructive pulmonary disease) (HCC)    Due to long history of tobacco abuse, still smoking  . Depression   . Diverticulosis of colon   . GERD (gastroesophageal reflux disease)   . Heart murmur    not sure never been evaluated  . History of uterine prolapse 10/2004   Dr Cletis Media  . Hypertension    not on any medications  . Hypothyroidism   . OCD (obsessive compulsive disorder)   . Osteoporosis   . PUD (peptic ulcer disease) 09/2006   H pylori  . Thyroid disease    Ho R thyroid noduel plus mild hyperthyroidism. Treated with radioiodine therapty on 04/2006. Dr Estrella Myrtle.    Patient Active Problem List   Diagnosis Date Noted  . Cancer of left breast, stage 1, estrogen receptor positive ([place]) 05/13/2017  . Malignant neoplasm of overlapping  sites of left breast in female, estrogen receptor positive ([place]) 03/08/2017  . Sinus pain 03/02/2017  . Callus of foot 11/20/2016  . Loss of appetite 12/29/2015  . Fatigue 12/29/2015  . Bipolar affective disorder, currently depressed, moderate ([place]) 11/26/2015  . Severe episode of recurrent major depressive disorder, without psychotic features ([place])   . Foreign body in foot, right 01/09/2015  . Blister of right ankle without infection 01/09/2015  . Sinusitis 11/26/2014  . Rotator cuff syndrome of right shoulder 11/14/2014  . Varicose veins of both lower extremities with pain 10/29/2014  . Neck pain 09/26/2014  . Metatarsal fracture 08/16/2014  . Pain in joint, shoulder region 08/16/2014  . Facial skin lesion 08/01/2014  . Leg edema, left 07/25/2014  . Edema of left lower extremity 07/22/2014  . Essential hypertension   . Thyroid activity decreased   . HLD (hyperlipidemia)   . Healthcare maintenance 05/31/2014  . Emotional neurotic disorder 05/07/2014  . Seborrheic keratosis 12/05/2013  . 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 Mass of left side of neck 10/17/2012  . At high risk for falls 06/28/2012  . Allergic rhinitis 06/16/2012  . Mass of left breast 10/27/2011  . Hearing loss 01/07/2011  . Foot pain 11/25/2010  . Thyroid nodule 10/16/2010  . Nausea 09/22/2010  . URINARY INCONTINENCE, STRESS, FEMALE 04/03/2010  . HYPERLIPIDEMIA 03/25/2010  . PULMONARY NODULE 12/23/2009  . COPD (chronic obstructive pulmonary disease) ([place]) 12/03/2009  .  Tobacco abuse counseling 11/05/2009  . CVA 11/05/2009  . BIPOLAR DISORDER UNSPECIFIED 11/28/2008  . Hypothyroidism 05/06/2008  . OCD (obsessive compulsive disorder) 10/13/2006  . GERD 10/13/2006  . OSTEOPOROSIS 10/13/2006    Past Surgical History:  Procedure Laterality Date  . ABDOMINAL HYSTERECTOMY    . BIOPSY THYROID  08/2008   Atypia and Hurtle cells, partial thyroidectomy   . BLADDER SUSPENSION     mesh sling  . Carotid  ultrasound  09/10/09   No significant extracranial carotid artery stenosis, vertebrals are patent with antegrade flow.   [name] CATARACT EXTRACTION Right   . COLONOSCOPY  06/03/2005   Hyperplastic polyps, sigmoid diverticulosis, internal hemorroids  . EYE SURGERY     laser surgery does not know which eye  . FOREIGN BODY REMOVAL Right 02/21/2015   Procedure: RIGHT FOOT FOREIGN BODY REMOVAL ;  Surgeon: Marchia Bond, MD;  Location: [facility];  Service: Orthopedics;  Laterality: Right;  . MASTECTOMY COMPLETE / SIMPLE W/ SENTINEL NODE BIOPSY Left 05/13/2017  . MASTECTOMY W/ SENTINEL NODE BIOPSY Left 05/13/2017   Procedure: LEFT TOTAL MASTECTOMY WITH AXILLARY SENTINEL LYMPH NODE BIOPSY;  Surgeon: Excell Seltzer, MD;  Location: [facility];  Service: General;  Laterality: Left;  . MRI Head  09/20/09   No acute intracranial abn.  Signal abnormality suggestive of chronic small vessel ischemia, maximal in the right subinsular white and deep gray mater.   [name] PFT  11/2006   Obstructive pattern.  Poor response to bronchodilators.   . THYROIDECTOMY, PARTIAL  08/2008   Dr Ronnald Collum  . TONSILLECTOMY AND ADENOIDECTOMY  10/2004  . TRANSTHORACIC ECHOCARDIOGRAM  12/11/3530   Normal systolic function.  EF 55-60%.  Mild MR, atria ok, trivial pericardial effusion  . TRANSVAGINAL TAPE (TVT) REMOVAL  06/15/2010   Dr Mancel Bale, for urge incontinence     OB History    Gravida  2   Para      Term      Preterm      AB      Living  2     SAB      TAB      Ectopic      Multiple      Live Births               Home Medications    Prior to Admission medications   Medication Sig Start Date End Date Taking? Authorizing Provider  aspirin 325 MG tablet Take 325 mg by mouth daily.    [provider]  cyclobenzaprine (FLEXERIL) 5 MG tablet Take 1 tablet (5 mg total) by mouth 2 (two) times daily as needed for muscle spasms. 12/18/17   Scot Jun, FNP  escitalopram (LEXAPRO) 10 MG  tablet Take 2 tablets (20 mg total) by mouth daily. 05/24/17   Nicholas Lose, MD  loperamide (IMODIUM) 2 MG capsule Take by mouth as needed for diarrhea or loose stools (as needed for diarrhea).    [provider]  Olopatadine HCl 0.2 % SOLN Apply 1 drop to eye daily. 12/13/17   Vanessa Kick, MD  tamoxifen (NOLVADEX) 20 MG tablet Take 1 tablet (20 mg total) by mouth daily. 05/24/17   Nicholas Lose, MD  traMADol (ULTRAM) 50 MG tablet Take 1 tablet (50 mg total) by mouth every 6 (six) hours as needed. 12/18/17   Scot Jun, FNP    Family History Family History  Problem Relation Age of Onset  . Heart disease Mother   .  Stomach cancer Mother   . Cirrhosis Sister   . Hypertension Sister   . Heart disease Maternal Grandmother   . Heart disease Maternal Grandfather   . Colon cancer Maternal Aunt   . Other Neg Hx     Social History Social History   Tobacco Use  . Smoking status: Former Smoker    Packs/day: 0.75    Years: 40.00    Pack years: 30.00    Types: Cigarettes    Last attempt to quit: 11/2016    Years since quitting: 1.1  . Smokeless tobacco: Never Used  Substance Use Topics  . Alcohol use: No    Alcohol/week: 0.0 standard drinks  . Drug use: No     Allergies   Seroquel [quetiapine fumarate]; Amoxicillin; Ciprofloxacin; Haloperidol lactate; Ketorolac tromethamine; Latuda [lurasidone hcl]; Thorazine [chlorpromazine]; and Wellbutrin [bupropion]   Review of Systems Review of Systems All other systems negative except as documented in the HPI. All pertinent positives and negatives as reviewed in the HPI.  Physical Exam Updated Vital Signs BP (!) 156/96 (BP Location: Right Arm)   Pulse 82   Temp 98.3 F (36.8 C) (Oral)   Resp 18   Ht 4\' 8"  (1.422 m)   Wt 42.6 kg   LMP 01/09/2012   SpO2 95%   BMI 21.07 kg/m   Physical Exam  Constitutional: She is oriented to person, place, and time. She appears well-developed and well-nourished. No distress.    HENT:  Head: Normocephalic and atraumatic.  Mouth/Throat: Oropharynx is clear and moist.  Eyes: Pupils are equal, round, and reactive to light.  Neck: Normal range of motion. Neck supple.  Cardiovascular: Normal rate, regular rhythm and normal heart sounds. Exam reveals no gallop and no friction rub.  No murmur heard. Pulmonary/Chest: Effort normal and breath sounds normal. No respiratory distress. She has no wheezes.  Abdominal: Soft. Bowel sounds are normal. She exhibits no distension. There is no tenderness.  Musculoskeletal:       Lumbar back: She exhibits tenderness and pain. She exhibits normal range of motion, no bony tenderness, no swelling and no deformity.  Neurological: She is alert and oriented to person, place, and time. She displays normal reflexes. No sensory deficit. She exhibits normal muscle tone. Coordination normal.  Skin: Skin is warm and dry. Capillary refill takes less than 2 seconds. No rash noted. No erythema.  Psychiatric: She has a normal mood and affect. Her behavior is normal.  Nursing note and vitals reviewed.    ED Treatments / Results  Labs (all labs ordered are listed, but only abnormal results are displayed) Labs Reviewed - No data to display  EKG None  Radiology Dg Lumbar Spine Complete  Result Date: 12/24/2017 CLINICAL DATA:  Sciatic pain.  Low back pain. EXAM: LUMBAR SPINE - COMPLETE 4+ VIEW COMPARISON:  CT 02/21/2016 FINDINGS: Degenerative facet disease and degenerative disc disease throughout the lumbar spine diffusely, most pronounced in the lower lumbar spine. No fracture or subluxation. SI joints are symmetric and unremarkable. Aortic atherosclerosis. No visible aneurysm. IMPRESSION: Degenerative disc and facet disease diffusely throughout the lumbar spine. No acute bony abnormality. Electronically Signed   By: Rolm Baptise M.D.   On: 12/24/2017 19:26    Procedures Procedures (including critical care time)  Medications Ordered in  ED Medications  HYDROcodone-acetaminophen (NORCO/VICODIN) 5-325 MG per tablet 1 tablet (has no administration in time range)  traMADol (ULTRAM) tablet 50 mg (50 mg Oral Given 12/24/17 1931)     Initial Impression /  Assessment and Plan / ED Course  I have reviewed the triage vital signs and the nursing notes.  Pertinent labs & imaging results that were available during my care of the patient were reviewed by me and considered in my medical decision making (see chart for details).     Patient will be treated for sciatica and advised she will need to follow-up with her doctor.  The patient agrees the plan and all questions were answered.  Patient has no neurological deficits noted on exam at this time.  Final Clinical Impressions(s) / ED Diagnoses   Final diagnoses:  None    ED Discharge Orders    None       Dalia Heading, PA-C 12/27/17 [id number]    Fredia Sorrow, MD 12/28/17 1257

## 2017-12-29 ENCOUNTER — Ambulatory Visit: Payer: Medicare Other | Attending: Family Medicine | Admitting: Physical Therapy

## 2017-12-29 ENCOUNTER — Ambulatory Visit (INDEPENDENT_AMBULATORY_CARE_PROVIDER_SITE_OTHER): Payer: Medicare Other | Admitting: Family Medicine

## 2017-12-29 ENCOUNTER — Other Ambulatory Visit: Payer: Self-pay

## 2017-12-29 ENCOUNTER — Encounter: Payer: Self-pay | Admitting: Family Medicine

## 2017-12-29 ENCOUNTER — Ambulatory Visit: Payer: Medicare Other | Admitting: Licensed Clinical Social Worker

## 2017-12-29 VITALS — HR 74 | Temp 98.1°F | Ht <= 58 in

## 2017-12-29 DIAGNOSIS — F0281 Dementia in other diseases classified elsewhere with behavioral disturbance: Secondary | ICD-10-CM | POA: Diagnosis not present

## 2017-12-29 DIAGNOSIS — F02818 Dementia in other diseases classified elsewhere, unspecified severity, with other behavioral disturbance: Secondary | ICD-10-CM

## 2017-12-29 DIAGNOSIS — G8929 Other chronic pain: Secondary | ICD-10-CM | POA: Diagnosis not present

## 2017-12-29 DIAGNOSIS — Z7189 Other specified counseling: Secondary | ICD-10-CM

## 2017-12-29 DIAGNOSIS — G301 Alzheimer's disease with late onset: Secondary | ICD-10-CM

## 2017-12-29 DIAGNOSIS — M5441 Lumbago with sciatica, right side: Secondary | ICD-10-CM | POA: Insufficient documentation

## 2017-12-29 DIAGNOSIS — M25552 Pain in left hip: Secondary | ICD-10-CM | POA: Diagnosis not present

## 2017-12-29 DIAGNOSIS — M5442 Lumbago with sciatica, left side: Secondary | ICD-10-CM | POA: Insufficient documentation
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
## 2017-12-29 NOTE — Patient Instructions (Signed)
Thank you for coming in to see [place] today. Please see below to review our plan for today's visit.  1. Continue the Lexapro. We think it would be great to establish with a provider for your depression when you move to [facility].  2. You would benefit from home health PT concerning your left hip pain. We will first get an MRI of your hip to rule out a fracture. We will inform your primary care physician.  Please call the clinic at [phone] if your symptoms worsen or you have any concerns. It was our pleasure to serve you.  Harriet Butte, [place], PGY-3

## 2017-12-30 NOTE — BH Specialist Note (Signed)
.  Type of Service: Clinical Social Work [facility] Assessment  Demographics [name] is a 72 y.o. female  referred by Dr. McDiarmid for assessing social needs, support and discuss advance directives.  Patient  was accompanied by her sister [name]. Reports :no concerns with social or financial needs today. No barriers to care identified. Concerns with walking.  Evaluated by PT during this visit see notes for treatment recommendations. Family/Social Information patient lives alone in senior housing, Patient enjoys interacting with her neighbors. States she will miss then when she move to [place] to live with daughter [name]. Transportation to appointments provided by sister or friend. Family members/support persons is daughter [name] and sister [name]. Neighbor cooks meals for patient.    Advance Directives: Living Will or Medical POA? Yes , per patient her daughter [name] has it.  They will ask [name] to bring a copy. Durable POA? Yes.   ; Is it on [facility] at [facility] No.  Interventions :    Psychoeducation   Clinical Impressions/Recommendations :No psychosocial stressors or barriers identified today. Assessed for SDOH. Patient is not established with psychiatry, last treated a [facility] many years ago and is not interested in reestablishing psychiatry care at this time.  Patient continues to take lexapro and states this helps with her depression. Patient may benefit from, and is in agreement to establish with psychiatry when she move with daughter. Educational information offered to patient/family/caregiver: . Living Will and Medical POA,  Plan:    1. Patient will  establish with medical and mental health providers when she moves to [facility]  2. Patient and sister will ask patient's daughter [name] to provide copy of POA and advance directives when she comes to visit.   3.  Patient will provide a copy papers when she returns to see PCP.  Dana Lanius, LCSW Licensed Clinical Social Worker [place]   [phone] 1:21 PM

## 2018-01-01 NOTE — Progress Notes (Signed)
I have interviewed and examined the patient.  I have discussed the case and verified the key findings with Dr. Yisroel Ramming.   I agree with their assessments and plans as documented in their note for today.   60 minutes face to face where spent in total with counseling / coordination of care took more than 50% of the total time. Counseling involved discussion with patient's children about applying services . Nature of advanced directives and role of HC POA in care of patients who are unable to speak for themselves.   Advance Care and [facility] POA documents were provided to family.  Care was coordinated with Physical therapy, and Social work, and Pharmacy who had reviewed patient's record prior to visit and made recommendations.

## 2018-01-01 NOTE — Addendum Note (Signed)
Addended byWendy Poet, Jaiona Simien D on: 01/01/2018 02:25 PM   Modules accepted: Level of Service

## 2018-01-10 ENCOUNTER — Telehealth: Payer: Self-pay | Admitting: *Deleted

## 2018-01-10 DIAGNOSIS — F329 Major depressive disorder, single episode, unspecified: Secondary | ICD-10-CM | POA: Diagnosis not present

## 2018-01-10 DIAGNOSIS — R262 Difficulty in walking, not elsewhere classified: Secondary | ICD-10-CM | POA: Diagnosis not present

## 2018-01-10 DIAGNOSIS — M6281 Muscle weakness (generalized): Secondary | ICD-10-CM | POA: Diagnosis not present

## 2018-01-10 DIAGNOSIS — M25552 Pain in left hip: Secondary | ICD-10-CM | POA: Diagnosis not present

## 2018-01-10 DIAGNOSIS — Z853 Personal history of malignant neoplasm of breast: Secondary | ICD-10-CM | POA: Diagnosis not present

## 2018-01-10 DIAGNOSIS — F0390 Unspecified dementia without behavioral disturbance: Secondary | ICD-10-CM | POA: Diagnosis not present

## 2018-01-10 NOTE — Telephone Encounter (Signed)
[name] is requesting the following verbal orders:  To continue PT as follows: 2 x week for 4 weeks, then 1 x week for 1 week.   Keshonna Valvo, Salome Spotted, CMA

## 2018-01-11 NOTE — Telephone Encounter (Signed)
Called and left verbal orders on secure answering machine.  Guadalupe Dawn MD PGY-2 Family Medicine Resident

## 2018-01-12 DIAGNOSIS — Z853 Personal history of malignant neoplasm of breast: Secondary | ICD-10-CM | POA: Diagnosis not present

## 2018-01-12 DIAGNOSIS — M6281 Muscle weakness (generalized): Secondary | ICD-10-CM | POA: Diagnosis not present

## 2018-01-12 DIAGNOSIS — F329 Major depressive disorder, single episode, unspecified: Secondary | ICD-10-CM | POA: Diagnosis not present

## 2018-01-12 DIAGNOSIS — F0390 Unspecified dementia without behavioral disturbance: Secondary | ICD-10-CM | POA: Diagnosis not present

## 2018-01-12 DIAGNOSIS — M25552 Pain in left hip: Secondary | ICD-10-CM | POA: Diagnosis not present

## 2018-01-12 DIAGNOSIS — R262 Difficulty in walking, not elsewhere classified: Secondary | ICD-10-CM | POA: Diagnosis not present

## 2018-01-16 ENCOUNTER — Ambulatory Visit: Payer: Medicare Other | Admitting: Family Medicine

## 2018-01-26 DIAGNOSIS — H524 Presbyopia: Secondary | ICD-10-CM | POA: Diagnosis not present

## 2018-01-26 DIAGNOSIS — Z961 Presence of intraocular lens: Secondary | ICD-10-CM | POA: Diagnosis not present

## 2018-01-26 DIAGNOSIS — H2512 Age-related nuclear cataract, left eye: Secondary | ICD-10-CM | POA: Diagnosis not present

## 2018-01-26 DIAGNOSIS — H52223 Regular astigmatism, bilateral: Secondary | ICD-10-CM | POA: Diagnosis not present

## 2018-01-26 DIAGNOSIS — H5203 Hypermetropia, bilateral: Secondary | ICD-10-CM | POA: Diagnosis not present

## 2018-04-24 ENCOUNTER — Ambulatory Visit: Payer: Medicare Other | Admitting: Family Medicine

## 2018-05-14 ENCOUNTER — Other Ambulatory Visit: Payer: Self-pay

## 2018-05-14 ENCOUNTER — Emergency Department (HOSPITAL_COMMUNITY)
Admission: EM | Admit: 2018-05-14 | Discharge: 2018-05-14 | Disposition: A | Discharge status: Home / Self Care | Payer: Medicare Other | Attending: Emergency Medicine | Admitting: Emergency Medicine

## 2018-05-14 ENCOUNTER — Emergency Department (HOSPITAL_COMMUNITY): Payer: Medicare Other

## 2018-05-14 DIAGNOSIS — Z79899 Other long term (current) drug therapy: Secondary | ICD-10-CM | POA: Diagnosis not present

## 2018-05-14 DIAGNOSIS — Z853 Personal history of malignant neoplasm of breast: Secondary | ICD-10-CM | POA: Insufficient documentation

## 2018-05-14 DIAGNOSIS — R200 Anesthesia of skin: Secondary | ICD-10-CM | POA: Insufficient documentation

## 2018-05-14 DIAGNOSIS — R531 Weakness: Secondary | ICD-10-CM | POA: Insufficient documentation

## 2018-05-14 DIAGNOSIS — Z87891 Personal history of nicotine dependence: Secondary | ICD-10-CM | POA: Insufficient documentation

## 2018-05-14 DIAGNOSIS — J449 Chronic obstructive pulmonary disease, unspecified: Secondary | ICD-10-CM | POA: Insufficient documentation

## 2018-05-14 DIAGNOSIS — Z7982 Long term (current) use of aspirin: Secondary | ICD-10-CM | POA: Diagnosis not present

## 2018-05-14 DIAGNOSIS — I10 Essential (primary) hypertension: Secondary | ICD-10-CM | POA: Diagnosis not present

## 2018-05-14 DIAGNOSIS — E039 Hypothyroidism, unspecified: Secondary | ICD-10-CM | POA: Diagnosis not present

## 2018-05-14 DIAGNOSIS — R202 Paresthesia of skin: Secondary | ICD-10-CM | POA: Diagnosis not present

## 2018-05-14 LAB — URINALYSIS, ROUTINE W REFLEX MICROSCOPIC
Bilirubin Urine: NEGATIVE
Glucose, UA: NEGATIVE mg/dL
Ketones, ur: NEGATIVE mg/dL
Leukocytes, UA: NEGATIVE
Nitrite: NEGATIVE
Protein, ur: NEGATIVE mg/dL
Specific Gravity, Urine: 1.01 (ref 1.005–1.030)
pH: 7 (ref 5.0–8.0)

## 2018-05-14 LAB — COMPREHENSIVE METABOLIC PANEL
ALT: 11 U/L (ref 0–44)
AST: 17 U/L (ref 15–41)
Albumin: 3.5 g/dL (ref 3.5–5.0)
Alkaline Phosphatase: 177 U/L — ABNORMAL HIGH (ref 38–126)
Anion gap: 7 (ref 5–15)
BUN: 10 mg/dL (ref 8–23)
CO2: 25 mmol/L (ref 22–32)
Calcium: 8.3 mg/dL — ABNORMAL LOW (ref 8.9–10.3)
Chloride: 109 mmol/L (ref 98–111)
Creatinine, Ser: 0.85 mg/dL (ref 0.44–1.00)
GFR calc Af Amer: >60 mL/min (ref >60)
GFR calc non Af Amer: >60 mL/min (ref >60)
Glucose, Bld: 91 mg/dL (ref 70–99)
Potassium: 4 mmol/L (ref 3.5–5.1)
Sodium: 141 mmol/L (ref 135–145)
Total Bilirubin: 0.5 mg/dL (ref 0.3–1.2)
Total Protein: 6 g/dL — ABNORMAL LOW (ref 6.5–8.1)

## 2018-05-14 LAB — CBC WITH DIFFERENTIAL/PLATELET
Abs Immature Granulocytes: 0.01 10*3/uL (ref 0.00–0.07)
Basophils Absolute: 0 10*3/uL (ref 0.0–0.1)
Basophils Relative: 1 %
Eosinophils Absolute: 0.3 10*3/uL (ref 0.0–0.5)
Eosinophils Relative: 6 %
HCT: 40.7 % (ref 36.0–46.0)
Hemoglobin: 12.8 g/dL (ref 12.0–15.0)
Immature Granulocytes: 0 %
Lymphocytes Relative: 27 %
Lymphs Abs: 1.1 10*3/uL (ref 0.7–4.0)
MCH: 32.4 pg (ref 26.0–34.0)
MCHC: 31.4 g/dL (ref 30.0–36.0)
MCV: 103 fL — ABNORMAL HIGH (ref 80.0–100.0)
Monocytes Absolute: 0.4 10*3/uL (ref 0.1–1.0)
Monocytes Relative: 9 %
Neutro Abs: 2.4 10*3/uL (ref 1.7–7.7)
Neutrophils Relative %: 57 %
Platelets: 241 10*3/uL (ref 150–400)
RBC: 3.95 MIL/uL (ref 3.87–5.11)
RDW: 14.1 % (ref 11.5–15.5)
WBC: 4.1 10*3/uL (ref 4.0–10.5)
nRBC: 0 % (ref 0.0–0.2)

## 2018-05-14 LAB — I-STAT TROPONIN, ED: Troponin i, poc: 0 ng/mL (ref 0.00–0.08)

## 2018-05-14 LAB — URINALYSIS, MICROSCOPIC (REFLEX)

## 2018-05-14 LAB — CBG MONITORING, ED: Glucose-Capillary: 80 mg/dL (ref 70–99)

## 2018-05-14 NOTE — ED Notes (Signed)
Pt back in room from CT 

## 2018-05-14 NOTE — ED Triage Notes (Signed)
Per EMS, patient coming from home with complaints of generalized weakness and left arm tingling. Stroke screen is negative. Patient states that she "feels off"   Patient is complaining of decreased PO intake that has been happening for the past 3 years.
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
## 2018-05-14 NOTE — ED Notes (Signed)
PT attempting to void again, pt denied an in and out cath

## 2018-05-14 NOTE — ED Notes (Signed)
Pt attempted to provide urine specimen but unable to void at this time.

## 2018-05-14 NOTE — ED Notes (Signed)
Pt transported to CT ?

## 2018-05-14 NOTE — ED Provider Notes (Addendum)
CHIEF COMPLAINT: Generalized weakness x1 year, left hand numbness intermittently x2 months  HPI: Patient is a 73 year old female with history of hypertension, hypothyroidism, COPD who presents to the emergency department with EMS for complaints of generalized weakness for over 1 year.  Also reports intermittent numbness in the entire left hand for 2 months.  No numbness currently.  Denies headache, neck or back pain.  No head injury.  Is concerned she could have had a stroke.  She denies chest pain, shortness of breath, fevers, cough, vomiting, diarrhea, bloody stools, melena.  No other numbness or focal weakness.  ROS: See HPI Constitutional: no fever  Eyes: no drainage  ENT: no runny nose   Cardiovascular:  no chest pain  Resp: no SOB  GI: no vomiting GU: no dysuria Integumentary: no rash  Allergy: no hives  Musculoskeletal: no leg swelling  Neurological: no slurred speech ROS otherwise negative  PAST MEDICAL HISTORY/PAST SURGICAL HISTORY:  Past Medical History:  Diagnosis Date  . Anxiety   . Arthritis   . Cancer [facility])    breast cancer  . COPD (chronic obstructive pulmonary disease) (HCC)    Due to long history of tobacco abuse, still smoking  . Depression   . Diverticulosis of colon   . GERD (gastroesophageal reflux disease)   . Heart murmur    not sure never been evaluated  . History of uterine prolapse 10/2004   Dr Cletis Media  . Hypertension    not on any medications  . Hypothyroidism   . OCD (obsessive compulsive disorder)   . Osteoporosis   . PUD (peptic ulcer disease) 09/2006   H pylori  . Thyroid disease    Ho R thyroid noduel plus mild hyperthyroidism. Treated with radioiodine therapty on 04/2006. Dr Estrella Myrtle.    MEDICATIONS:  Prior to Admission medications   Medication Sig Start Date End Date Taking? Authorizing Provider  Acetaminophen-Codeine (TYLENOL/CODEINE #3) 300-30 MG tablet Take 1 tablet by mouth every 6 (six) hours as needed for pain. 12/24/17   Lawyer,  Harrell Gave, PA-C  aspirin 325 MG tablet Take 325 mg by mouth daily.    [provider]  cyclobenzaprine (FLEXERIL) 5 MG tablet Take 1 tablet (5 mg total) by mouth 2 (two) times daily as needed for muscle spasms. 12/18/17   Scot Jun, FNP  escitalopram (LEXAPRO) 10 MG tablet Take 2 tablets (20 mg total) by mouth daily. 05/24/17   Nicholas Lose, MD  loperamide (IMODIUM) 2 MG capsule Take by mouth as needed for diarrhea or loose stools (as needed for diarrhea).    [provider]  Olopatadine HCl 0.2 % SOLN Apply 1 drop to eye daily. 12/13/17   Vanessa Kick, MD  predniSONE (DELTASONE) 50 MG tablet Take 1 tablet (50 mg total) by mouth daily with breakfast. 12/24/17   Lawyer, Harrell Gave, PA-C  tamoxifen (NOLVADEX) 20 MG tablet Take 1 tablet (20 mg total) by mouth daily. 05/24/17   Nicholas Lose, MD  traMADol (ULTRAM) 50 MG tablet Take 1 tablet (50 mg total) by mouth every 6 (six) hours as needed. 12/18/17   Scot Jun, FNP    ALLERGIES:  Allergies  Allergen Reactions  . Seroquel [Quetiapine Fumarate] Other (See Comments)    Reaction:  Drowsiness   . Amoxicillin Nausea Only and Other (See Comments)    Has patient had a PCN reaction causing immediate rash, facial/tongue/throat swelling, SOB or lightheadedness with hypotension: No Has patient had a PCN reaction causing severe rash involving mucus membranes or skin  necrosis: No Has patient had a PCN reaction that required hospitalization No Has patient had a PCN reaction occurring within the last 10 years: No If all of the above answers are "NO", then may proceed with Cephalosporin use.  . Ciprofloxacin Other (See Comments)    Reaction:  Unknown   . Haloperidol Lactate Other (See Comments)    Reaction:  Arm stiffness   . Ketorolac Tromethamine Other (See Comments)    Reaction:  Unknown   . Latuda [Lurasidone Hcl] Other (See Comments)    Pt states that this medication makes her feel "weird".    . Thorazine  [Chlorpromazine] Other (See Comments)    Reaction:  Unknown   . Wellbutrin [Bupropion] Other (See Comments)    Pt states that this medication makes her feel "weird".      SOCIAL HISTORY:  Social History   Tobacco Use  . Smoking status: Former Smoker    Packs/day: 0.75    Years: 40.00    Pack years: 30.00    Types: Cigarettes    Last attempt to quit: 11/2016    Years since quitting: 1.5  . Smokeless tobacco: Never Used  Substance Use Topics  . Alcohol use: No    Alcohol/week: 0.0 standard drinks    FAMILY HISTORY: Family History  Problem Relation Age of Onset  . Heart disease Mother   . Stomach cancer Mother   . Cirrhosis Sister   . Hypertension Sister   . Heart disease Maternal Grandmother   . Heart disease Maternal Grandfather   . Colon cancer Maternal Aunt   . Other Neg Hx     EXAM: BP (!) 141/79 (BP Location: Left Arm)   Pulse 63   Temp 98.5 F (36.9 C) (Oral)   Resp 20   Ht 4\' 9"  (1.448 m)   Wt 42.2 kg   LMP 01/09/2012   SpO2 100%   BMI 20.13 kg/m  CONSTITUTIONAL: Alert and oriented and responds appropriately to questions. Well-appearing; well-nourished, elderly, no distress HEAD: Normocephalic EYES: Conjunctivae clear, pupils appear equal, EOMI ENT: normal nose; moist mucous membranes NECK: Supple, no meningismus, no nuchal rigidity, no LAD  CARD: RRR; S1 and S2 appreciated; no murmurs, no clicks, no rubs, no gallops RESP: Normal chest excursion without splinting or tachypnea; breath sounds clear and equal bilaterally; no wheezes, no rhonchi, no rales, no hypoxia or respiratory distress, speaking full sentences ABD/GI: Normal bowel sounds; non-distended; soft, non-tender, no rebound, no guarding, no peritoneal signs, no hepatosplenomegaly BACK:  The back appears normal and is non-tender to palpation, there is no CVA tenderness EXT: Normal ROM in all joints; non-tender to palpation; no edema; normal capillary refill; no cyanosis, no calf tenderness or  swelling; 2+ radial pulses bilaterally SKIN: Normal color for age and race; warm; no rash NEURO: Moves all extremities equally, no drift, sensation to light touch intact diffusely, cranial nerves II through XII intact, normal speech PSYCH: Poor hygiene.  Mood appropriate.  MEDICAL DECISION MAKING: Patient here with complaints of chronic conditions.  Complains of generalized weakness for 1 year and left hand numbness intermittently for 2 months.  Currently neurologically intact without focal neurologic deficits.  No recent infectious symptoms.  Patient unable to tell me what changed tonight and prompted her to come to the emergency department by ambulance.  She has outpatient follow-up per her report.  Will check basic labs, urine, head CT.  EKG shows no ischemic changes, arrhythmia or interval abnormalities.  ED PROGRESS: Labs unremarkable.  Urine does  not appear infected.  Head CT normal.  I have recommended outpatient follow-up for her chronic symptoms including outpatient follow-up with her PCP and neurology.  Patient agrees with this plan.  I do not feel she needs medical admission at this time given no significant change in symptoms for weeks.   At this time, I do not feel there is any life-threatening condition present. I have reviewed and discussed all results (EKG, imaging, lab, urine as appropriate) and exam findings with patient/family. I have reviewed nursing notes and appropriate previous records.  I feel the patient is safe to be discharged home without further emergent workup and can continue workup as an outpatient as needed. Discussed usual and customary return precautions. Patient/family verbalize understanding and are comfortable with this plan.  Outpatient follow-up has been provided as needed. All questions have been answered.       EKG Interpretation  Date/Time:  Sunday May 14 2018 05:43:04 EST Ventricular Rate:  70 PR Interval:    QRS Duration: 97 QT  Interval:  480 QTC Calculation: 518 R Axis:   83 Text Interpretation:  Sinus rhythm Anterior infarct, old Prolonged QT interval No significant change since last tracing Confirmed by Pryor Curia [phone]) on 05/14/2018 5:50:59 AM         Efosa Treichler, Delice Bison, DO 05/14/18 0921    Patient was documented to be hypoxic on discharge by nursing staff.  Confirm with nurse that this is an error.  Patient with normal oxygen saturation while in the ED.  No shortness of breath here.   Astou Lada, Delice Bison, DO 05/14/18 [phone]

## 2018-05-14 NOTE — Discharge Instructions (Addendum)
Your labs, urine, EKG and head CT were normal today.  You need to follow-up with an outpatient primary care physician for your chronic symptoms.   Please follow-up with a neurologist as an outpatient for your left hand numbness.  Steps to find a Primary Care Provider (PCP):  Call [phone] or [phone] to access "[place] a Doctor Service."  2.  You may also go on the [facility] website at [email or web address]  3.  [facility] also frequently accepts new patients.  [facility] [phone]  4.  There are also multiple [facility], [facility] and [facility] practices throughout the [place] that are frequently accepting new patients. You may find a clinic that is close to your home and contact them.  [facility] [email or web address] [phone]  [facility] [email or web address]  [facility] [email or web address] [place] [email or web address] [phone]  5.  Local Health Departments also can provide primary care services.  [facility]  [place] [address] [phone]  [facility] [place] [address] [place] [facility] [place] [phone]

## 2018-06-03 ENCOUNTER — Other Ambulatory Visit: Payer: Self-pay | Admitting: Hematology and Oncology

## 2018-06-12 ENCOUNTER — Telehealth: Payer: Self-pay | Admitting: *Deleted

## 2018-06-12 NOTE — Telephone Encounter (Signed)
Pt lm on RN VM requesting a return call.   Attempted to call pt back, [email or web address] for callback. Fleeger, Salome Spotted, CMA

## 2018-06-13 ENCOUNTER — Encounter: Payer: Self-pay | Admitting: Family Medicine

## 2018-06-13 ENCOUNTER — Other Ambulatory Visit: Payer: Self-pay

## 2018-06-13 ENCOUNTER — Ambulatory Visit (INDEPENDENT_AMBULATORY_CARE_PROVIDER_SITE_OTHER): Payer: Medicare Other | Admitting: Family Medicine

## 2018-06-13 VITALS — BP 140/80 | HR 79 | Temp 97.8°F | Wt 97.0 lb

## 2018-06-13 DIAGNOSIS — R110 Nausea: Secondary | ICD-10-CM

## 2018-06-13 DIAGNOSIS — N6320 Unspecified lump in the left breast, unspecified quadrant: Secondary | ICD-10-CM | POA: Diagnosis not present

## 2018-06-13 DIAGNOSIS — Z9012 Acquired absence of left breast and nipple: Secondary | ICD-10-CM | POA: Diagnosis not present

## 2018-06-13 DIAGNOSIS — Z170 Estrogen receptor positive status [ER+]: Secondary | ICD-10-CM

## 2018-06-13 DIAGNOSIS — C50912 Malignant neoplasm of unspecified site of left female breast: Secondary | ICD-10-CM

## 2018-06-13 DIAGNOSIS — G479 Sleep disorder, unspecified: Secondary | ICD-10-CM

## 2018-06-13 DIAGNOSIS — Z719 Counseling, unspecified: Secondary | ICD-10-CM | POA: Diagnosis not present

## 2018-06-13 MED ORDER — PROMETHAZINE HCL 12.5 MG PO TABS: 12.5000 mg | ORAL_TABLET | Freq: Three times a day (TID) | ORAL | 0 refills | Status: DC | PRN

## 2018-06-13 MED ORDER — TRAZODONE HCL 50 MG PO TABS: 25.0000 mg | ORAL_TABLET | Freq: Every evening | ORAL | 0 refills | Status: DC | PRN

## 2018-06-13 NOTE — Assessment & Plan Note (Signed)
Patient educated on corona virus outbreak and need for proper hand hygiene.  Patient also encouraged to avoid those who are sick and exhibiting symptoms of cough, fever, shortness of breath.  Patient voiced understanding.

## 2018-06-13 NOTE — Patient Instructions (Signed)
Thank you for coming to see me today. It was a pleasure. Today we talked about:   The lumps.  I have placed a referral to Oncology, Dr. Lindi Adie, and Surgery, Dr. Excell Seltzer for the lump.  If you do not hear from them in the next  week, please give [place] a call.  Please follow-up with Dr. Kris Mouton in 1 month.  If you have any questions or concerns, please do not hesitate to call the office at [phone].  Best,   Ari[place] Constable, DO

## 2018-06-13 NOTE — Progress Notes (Signed)
Subjective: Chief Complaint  Patient presents with  . Breast Problem     HPI: [name] is a 73 y.o. presenting to clinic today to discuss the following:  1 Left breast lumps Patient has a history of estrogen receptor positive breast cancer first diagnosed in 2018.  She is s/p left total mastectomy on 05/13/2017.  Per notes, was recommended that she undergo adjuvant radiation therapy but patient declined.  She has been taking tamoxifen since February 2019.  She has not been following with oncology as scheduled, and was last seen by them 05/24/2017.  Patient states that about 3 days ago she believes she noticed some lumps on the left side of her chest.  She has not had any overlying skin changes or drainage and states that they are not painful.  She is concerned that she may have cancer again.  She does not note any weight changes or night sweats.  She does state that she does not have an appetite and that she has been feeling very tired and weak.  2 COVID-19 Concerns Patient would like to know if she should be worried about COVID-19 and what she should do to avoid it.  She has not had any recent travel, cough, fever, shortness of breath.  3 Refill Requests Patient also notes that she would like refills of her medications: Promethazine and Trazodone.  She states that she takes promethazine for chronic nausea.  She states that she tolerates the medication well.  She takes trazodone prn for difficulty sleeping without complaints or concerns.  ROS noted in HPI.   Past Medical, Surgical, Social, and Family History Reviewed & Updated per EMR.   Pertinent Historical Findings include:   Social History   Tobacco Use  Smoking Status Former Smoker  . Packs/day: 0.75  . Years: 40.00  . Pack years: 30.00  . Types: Cigarettes  . Last attempt to quit: 11/2016  . Years since quitting: 1.6  Smokeless Tobacco Never Used      Objective: BP 140/80   Pulse 79   Temp 97.8 F (36.6 C)  (Oral)   Wt 97 lb (44 kg)   LMP 01/09/2012   SpO2 99%   BMI 20.99 kg/m  Vitals and nursing notes reviewed  Physical Exam: General: 73 y.o. female in NAD Lungs: no increased work or breathing, on RA Chest: see below  Skin: warm and dry Extremities: No edema Lymph nodes: No bilateral axillary lymphadenopathy palpated Physical Exam  Pulmonary/Chest: Right breast exhibits no mass and no skin change.         No results found for this or any previous visit (from the past 72 hour(s)).  Assessment/Plan:  Mass of left breast Patient has a history of estrogen receptor positive breast cancer.  S/p left total mastectomy 05/13/2017.  Has been on tamoxifen.  She has not followed up well with oncology and has not been seen in over a year.  Soft tissue masses suspect for breast cancer, although could be skin changes following surgery.  Given patient's concerning history, however, would offer her to be seen by surgeon and should reestablish with oncology for surveillance.  We will hold off on imaging at this time and defer to surgeon.  Patient agrees to this plan and states that she will ensure that she follows up with these appointments. -Referral to general surgery, Dr. Excell Seltzer, would like seen within the next 2 weeks -Referral to oncology, Dr. Lindi Adie, to reestablish for surveillance  Sleep disturbance  Will refill patient's trazodone. -Trazodone 50 mg nightly as needed for sleep disturbance  Nausea Longstanding problem which seems well controlled on Phenergan PRN.  Will refill Phenergan. -Phenergan 12.5 mg every 8 hours as needed  Health education Patient educated on corona virus outbreak and need for proper hand hygiene.  Patient also encouraged to avoid those who are sick and exhibiting symptoms of cough, fever, shortness of breath.  Patient voiced understanding.   Discussed case with Dr. McDiarmid.  PATIENT EDUCATION PROVIDED: See AVS    Diagnosis and plan along with any newly  prescribed medication(s) were discussed in detail with this patient today. The patient verbalized understanding and agreed with the plan. Patient advised if symptoms worsen return to clinic or ER.    Orders Placed This Encounter  Procedures  . Ambulatory referral to General Surgery    Referral Priority:   Routine    Referral Type:   Surgical    Referral Reason:   Specialty Services Required    Requested Specialty:   General Surgery    Number of Visits Requested:   1  . Ambulatory referral to Oncology    Referral Priority:   Routine    Referral Type:   Consultation    Referral Reason:   Specialty Services Required    Number of Visits Requested:   1    Meds ordered this encounter  Medications  . DISCONTD: promethazine (PHENERGAN) 12.5 MG tablet    Sig: Take 1 tablet (12.5 mg total) by mouth every 8 (eight) hours as needed for nausea or vomiting.    Dispense:  20 tablet    Refill:  0  . traZODone (DESYREL) 50 MG tablet    Sig: Take 0.5-1 tablets (25-50 mg total) by mouth at bedtime as needed for sleep.    Dispense:  30 tablet    Refill:  0  . promethazine (PHENERGAN) 12.5 MG tablet    Sig: Take 1 tablet (12.5 mg total) by mouth every 8 (eight) hours as needed for nausea or vomiting.    Dispense:  20 tablet    Refill:  0     [place] Constable, DO 06/13/2018, 11:44 AM PGY-1 [place]

## 2018-06-13 NOTE — Assessment & Plan Note (Signed)
Patient has a history of estrogen receptor positive breast cancer.  S/p left total mastectomy 05/13/2017.  Has been on tamoxifen.  She has not followed up well with oncology and has not been seen in over a year.  Soft tissue masses suspect for breast cancer, although could be skin changes following surgery.  Given patient's concerning history, however, would offer her to be seen by surgeon and should reestablish with oncology for surveillance.  We will hold off on imaging at this time and defer to surgeon.  Patient agrees to this plan and states that she will ensure that she follows up with these appointments. -Referral to general surgery, Dr. Excell Seltzer, would like seen within the next 2 weeks -Referral to oncology, Dr. Lindi Adie, to reestablish for surveillance

## 2018-06-13 NOTE — Assessment & Plan Note (Signed)
Longstanding problem which seems well controlled on Phenergan PRN.  Will refill Phenergan. -Phenergan 12.5 mg every 8 hours as needed

## 2018-06-13 NOTE — Assessment & Plan Note (Signed)
Will refill patient's trazodone. -Trazodone 50 mg nightly as needed for sleep disturbance

## 2018-06-23 ENCOUNTER — Telehealth: Payer: Self-pay | Admitting: Family Medicine

## 2018-06-23 NOTE — Telephone Encounter (Signed)
Pt called to speak to CMA. Pt had an appt with her surgeon concerning the lumps on her breast but she cancelled it due to the virus going around. Pt would like to know what to do and when to make another appt with the surgeon. Please call pt back to discuss this.

## 2018-06-23 NOTE — Telephone Encounter (Signed)
LMOVM telling pt she would have to call her surgeon back to reschedule the surgery. Deseree Kennon Holter, CMA

## 2018-07-09 ENCOUNTER — Other Ambulatory Visit: Payer: Self-pay | Admitting: Family Medicine

## 2018-07-09 DIAGNOSIS — G479 Sleep disorder, unspecified: Secondary | ICD-10-CM

## 2018-07-19 DIAGNOSIS — Z170 Estrogen receptor positive status [ER+]: Secondary | ICD-10-CM | POA: Diagnosis not present

## 2018-07-19 DIAGNOSIS — C50812 Malignant neoplasm of overlapping sites of left female breast: Secondary | ICD-10-CM | POA: Diagnosis not present

## 2018-08-01 ENCOUNTER — Other Ambulatory Visit: Payer: Self-pay | Admitting: Hematology and Oncology

## 2018-08-02 ENCOUNTER — Telehealth: Payer: Self-pay | Admitting: Hematology and Oncology

## 2018-08-02 NOTE — Telephone Encounter (Signed)
Scheduled yearly appt per sch msg

## 2018-10-23 ENCOUNTER — Other Ambulatory Visit: Payer: Self-pay | Admitting: Hematology and Oncology

## 2018-10-24 ENCOUNTER — Other Ambulatory Visit: Payer: Self-pay | Admitting: *Deleted

## 2018-10-27 NOTE — Assessment & Plan Note (Deleted)
05/13/2017: Left simple mastectomy: IDC grade 2, 7.7 cm, DCIS intermediate grade, lymphovascular invasion identified, perineural invasion present, margins negative, 1/2 lymph nodes positive, ER 6200% strong, PR 0%, HER-2 negative, Ki-67 5%, T3N1 a stage IIIa  Plan: Patient is extremely frail and has major depression issues. Current treatment: Tamoxifen 20 mg daily started 05/24/2017  Tamoxifen toxicities:  Breast cancer surveillance: Patient needs a mammogram for surveillance.  Major depression: Patient sleeps for up to 15 hours every day.  I encouraged her to eat better and have more social interactions.  Encouraged her to be more physically active as well.  Return to clinic in 1 year for follow-up

## 2018-11-02 ENCOUNTER — Inpatient Hospital Stay: Payer: Medicare Other | Attending: Hematology and Oncology | Admitting: Hematology and Oncology

## 2019-01-10 ENCOUNTER — Other Ambulatory Visit: Payer: Self-pay | Admitting: Hematology and Oncology

## 2019-01-11 ENCOUNTER — Telehealth: Payer: Self-pay | Admitting: Hematology and Oncology

## 2019-01-11 NOTE — Telephone Encounter (Signed)
Scheduled appt per 10/8 sch message - no answer and no vmail - mailed letter with appt date and time

## 2019-01-12 ENCOUNTER — Other Ambulatory Visit: Payer: Self-pay

## 2019-01-12 DIAGNOSIS — R110 Nausea: Secondary | ICD-10-CM

## 2019-01-13 MED ORDER — PROMETHAZINE HCL 12.5 MG PO TABS: 12.5000 mg | ORAL_TABLET | Freq: Three times a day (TID) | ORAL | 0 refills | Status: DC | PRN

## 2019-01-19 ENCOUNTER — Other Ambulatory Visit: Payer: Self-pay

## 2019-01-19 DIAGNOSIS — R110 Nausea: Secondary | ICD-10-CM

## 2019-01-19 MED ORDER — PROMETHAZINE HCL 12.5 MG PO TABS: 12.5000 mg | ORAL_TABLET | Freq: Three times a day (TID) | ORAL | 0 refills | Status: DC | PRN

## 2019-01-19 MED ORDER — TAMOXIFEN CITRATE 20 MG PO TABS: 20.0000 mg | ORAL_TABLET | Freq: Every day | ORAL | 0 refills | Status: DC

## 2019-02-13 ENCOUNTER — Inpatient Hospital Stay: Payer: Medicare Other | Attending: Hematology and Oncology | Admitting: Hematology and Oncology

## 2019-02-13 NOTE — Assessment & Plan Note (Deleted)
05/13/2017: Left simple mastectomy: IDC grade 2, 7.7 cm, DCIS intermediate grade, lymphovascular invasion identified, perineural invasion present, margins negative, 1/2 lymph nodes positive, ER 62% strong, PR 0%, HER-2 negative, Ki-67 5%, T3N1 a stage IIIa  Plan: Patient is extremely frail and has major depression issues. Refused adjuvant radiation  Current treatment: Adjuvant tamoxifen started 05/24/2017 Tamoxifen toxicities:  Breast cancer surveillance: 1.  Breast exam 02/13/2019: Benign 2.  Mammogram the right breast will need to be scheduled  Major depression:  Return to clinic in 1 year for follow-up

## 2019-04-08 ENCOUNTER — Other Ambulatory Visit: Payer: Self-pay | Admitting: Hematology and Oncology

## 2019-04-14 ENCOUNTER — Other Ambulatory Visit: Payer: Self-pay | Admitting: Hematology and Oncology

## 2019-04-14 DIAGNOSIS — R110 Nausea: Secondary | ICD-10-CM

## 2019-04-16 ENCOUNTER — Telehealth: Payer: Self-pay | Admitting: Hematology and Oncology

## 2019-04-16 NOTE — Telephone Encounter (Signed)
Scheduled apt per 1/11 sch message - unable to reach pt . Left message with appt date and time and mailed reminder letter

## 2019-04-18 ENCOUNTER — Telehealth: Payer: Self-pay | Admitting: *Deleted

## 2019-04-18 NOTE — Telephone Encounter (Signed)
Attempt x2 to contact pt regarding VM left for nausea medication refill, no answer, lvm to call the office.

## 2019-04-18 NOTE — Telephone Encounter (Signed)
Attempt x1 to return call to pt, no answer, lvm to call office back.

## 2019-04-19 ENCOUNTER — Other Ambulatory Visit: Payer: Self-pay | Admitting: Hematology and Oncology

## 2019-04-19 DIAGNOSIS — R110 Nausea: Secondary | ICD-10-CM

## 2019-05-02 ENCOUNTER — Telehealth: Payer: Self-pay | Admitting: Hematology and Oncology

## 2019-05-02 NOTE — Telephone Encounter (Signed)
Rescheduled per MD. Hulen Skains and left a msg. Mailing printout

## 2019-05-17 ENCOUNTER — Ambulatory Visit: Payer: Medicare Other | Admitting: Hematology and Oncology

## 2019-05-24 ENCOUNTER — Telehealth: Payer: Self-pay | Admitting: Hematology and Oncology

## 2019-05-24 ENCOUNTER — Inpatient Hospital Stay: Payer: Medicare Other | Attending: Hematology and Oncology | Admitting: Hematology and Oncology

## 2019-05-24 NOTE — Telephone Encounter (Signed)
Per 2/17 sch msg. Left VM for pt to call the office back to reschedule her 2/18 appt that she requested to reschedule.

## 2019-05-24 NOTE — Assessment & Plan Note (Deleted)
05/13/2017: Left simple mastectomy: IDC grade 2, 7.7 cm, DCIS intermediate grade, lymphovascular invasion identified, perineural invasion present, margins negative, 1/2 lymph nodes positive, ER 100% strong, PR 0%, HER-2 negative, Ki-67 5%, T3N1 a stage IIIa  Patient is extremely frail and has major depression issues. She does not want to undergo adjuvant radiation therapy  Current Treatment: Tamoxifen Tamoxifen Toxicities:  Breast Cancer Surveillance: Mammogram: Not done

## 2019-05-25 ENCOUNTER — Telehealth: Payer: Self-pay | Admitting: *Deleted

## 2019-05-25 NOTE — Telephone Encounter (Signed)
Receive after hours message from pt.  Attempt x1 to return call, no answer, LVM to return call to the office.

## 2019-06-15 ENCOUNTER — Other Ambulatory Visit: Payer: Self-pay

## 2019-06-15 ENCOUNTER — Ambulatory Visit (HOSPITAL_COMMUNITY)
Admission: EM | Admit: 2019-06-15 | Discharge: 2019-06-15 | Disposition: A | Discharge status: Home / Self Care | Payer: Medicare Other | Attending: Family Medicine | Admitting: Family Medicine

## 2019-06-15 ENCOUNTER — Encounter (HOSPITAL_COMMUNITY): Payer: Self-pay

## 2019-06-15 ENCOUNTER — Ambulatory Visit (INDEPENDENT_AMBULATORY_CARE_PROVIDER_SITE_OTHER): Payer: Medicare Other

## 2019-06-15 DIAGNOSIS — R110 Nausea: Secondary | ICD-10-CM | POA: Diagnosis not present

## 2019-06-15 DIAGNOSIS — R030 Elevated blood-pressure reading, without diagnosis of hypertension: Secondary | ICD-10-CM

## 2019-06-15 DIAGNOSIS — S50311A Abrasion of right elbow, initial encounter: Secondary | ICD-10-CM

## 2019-06-15 DIAGNOSIS — Y92009 Unspecified place in unspecified non-institutional (private) residence as the place of occurrence of the external cause: Secondary | ICD-10-CM

## 2019-06-15 DIAGNOSIS — R0789 Other chest pain: Secondary | ICD-10-CM

## 2019-06-15 DIAGNOSIS — W19XXXA Unspecified fall, initial encounter: Secondary | ICD-10-CM

## 2019-06-15 DIAGNOSIS — S299XXA Unspecified injury of thorax, initial encounter: Secondary | ICD-10-CM | POA: Diagnosis not present

## 2019-06-15 MED ORDER — PROMETHAZINE HCL 12.5 MG PO TABS: 12.5000 mg | ORAL_TABLET | Freq: Three times a day (TID) | ORAL | 2 refills | Status: DC | PRN

## 2019-06-15 NOTE — Discharge Instructions (Addendum)
Your blood pressure is elevated today.  I am concerned that you get dizzy when you stand up so I am not starting you on blood pressure medicine until you see your primary care doctor.  You should make an appointment to have this evaluated in the next week or so.  Taking a warm shower will help.

## 2019-06-15 NOTE — ED Provider Notes (Signed)
[facility]    CSN: [id number] Arrival date & time: 06/15/19  1459      History   Chief Complaint Chief Complaint  Patient presents with  . Fall    HPI [name] is a 74 y.o. female.   This is a 74 year old woman who presents for evaluation of rib pain having fallen 5 days ago.  She is an established [employer] urgent care patient.  Patient appears to be in a very poor state.  Her clothes are dirty, her shoes are taped to cover holes and hold the sole on, and she is disheveled and does not appear to be clean.  Patient states that she has right anterior rib pain.  She denies having hit her head.  She denies any other injuries except for an abrasion on her right elbow.  She has no abdominal pain, shortness of breath, or anterior chest pain.  She does not have a history of syncope.  She does state that she gets dizzy when she gets up quickly.  Patient is a former smoker.  She has had a left mastectomy for breast cancer.  Patient brought her by cab,  Call Suezanne Jacquet  [phone] who was estranged in childhood because of pt's mental illness.    Patient goes to [employer].  Past Medical History:  Diagnosis Date  . Anxiety   . Arthritis   . Cancer [facility])    breast cancer  . COPD (chronic obstructive pulmonary disease) (HCC)    Due to long history of tobacco abuse, still smoking  . Depression   . Diverticulosis of colon   . GERD (gastroesophageal reflux disease)   . Heart murmur    not sure never been evaluated  . History of uterine prolapse 10/2004   Dr Cletis Media  . Hypertension    not on any medications  . Hypothyroidism   . OCD (obsessive compulsive disorder)   . Osteoporosis   . PUD (peptic ulcer disease) 09/2006   H pylori  . Thyroid disease    Ho R thyroid noduel plus mild hyperthyroidism. Treated with radioiodine therapty on 04/2006. Dr Estrella Myrtle.    Patient Active Problem List   Diagnosis Date Noted  . Sleep disturbance 06/13/2018  .  Health education 06/13/2018  . Chronic left hip pain 12/29/2017  . Late onset Alzheimer's disease with behavioral disturbance ([place]) 12/29/2017  . Cancer of left breast, stage 1, estrogen receptor positive ([place]) 05/13/2017  . Malignant neoplasm of overlapping sites of left breast in female, estrogen receptor positive ([place]) 03/08/2017  . Sinus pain 03/02/2017  . Callus of foot 11/20/2016  . Loss of appetite 12/29/2015  . Fatigue 12/29/2015  . Bipolar affective disorder, currently depressed, moderate ([place]) 11/26/2015  . Severe episode of recurrent major depressive disorder, without psychotic features ([place])   . Foreign body in foot, right 01/09/2015  . Blister of right ankle without infection 01/09/2015  . Sinusitis 11/26/2014  . Rotator cuff syndrome of right shoulder 11/14/2014  . Varicose veins of both lower extremities with pain 10/29/2014  . Neck pain 09/26/2014  . Metatarsal fracture 08/16/2014  . Pain in joint, shoulder region 08/16/2014  . Facial skin lesion 08/01/2014  . Leg edema, left 07/25/2014  . Edema of left lower extremity 07/22/2014  . Essential hypertension   . Thyroid activity decreased   . HLD (hyperlipidemia)   . Healthcare maintenance 05/31/2014  . Emotional neurotic disorder 05/07/2014  . Seborrheic keratosis 12/05/2013  . Angiomyolipoma of right  kidney 06/08/2013  . Mass of left side of neck 10/17/2012  . At high risk for falls 06/28/2012  . Allergic rhinitis 06/16/2012  . Mass of left breast 10/27/2011  . Hearing loss 01/07/2011  . Foot pain 11/25/2010  . Thyroid nodule 10/16/2010  . Nausea 09/22/2010  . URINARY INCONTINENCE, STRESS, FEMALE 04/03/2010  . HYPERLIPIDEMIA 03/25/2010  . PULMONARY NODULE 12/23/2009  . COPD (chronic obstructive pulmonary disease) ([place]) 12/03/2009  . Tobacco abuse counseling 11/05/2009  . CVA 11/05/2009  . BIPOLAR DISORDER UNSPECIFIED 11/28/2008  . Hypothyroidism 05/06/2008  . OCD (obsessive compulsive disorder) 10/13/2006    . GERD 10/13/2006  . OSTEOPOROSIS 10/13/2006    Past Surgical History:  Procedure Laterality Date  . ABDOMINAL HYSTERECTOMY    . BIOPSY THYROID  08/2008   Atypia and Hurtle cells, partial thyroidectomy   . BLADDER SUSPENSION     mesh sling  . Carotid ultrasound  09/10/09   No significant extracranial carotid artery stenosis, vertebrals are patent with antegrade flow.   [name] CATARACT EXTRACTION Right   . COLONOSCOPY  06/03/2005   Hyperplastic polyps, sigmoid diverticulosis, internal hemorroids  . EYE SURGERY     laser surgery does not know which eye  . FOREIGN BODY REMOVAL Right 02/21/2015   Procedure: RIGHT FOOT FOREIGN BODY REMOVAL ;  Surgeon: Marchia Bond, MD;  Location: [facility];  Service: Orthopedics;  Laterality: Right;  . MASTECTOMY COMPLETE / SIMPLE W/ SENTINEL NODE BIOPSY Left 05/13/2017  . MASTECTOMY W/ SENTINEL NODE BIOPSY Left 05/13/2017   Procedure: LEFT TOTAL MASTECTOMY WITH AXILLARY SENTINEL LYMPH NODE BIOPSY;  Surgeon: Excell Seltzer, MD;  Location: [facility];  Service: General;  Laterality: Left;  . MRI Head  09/20/09   No acute intracranial abn.  Signal abnormality suggestive of chronic small vessel ischemia, maximal in the right subinsular white and deep gray mater.   [name] PFT  11/2006   Obstructive pattern.  Poor response to bronchodilators.   . THYROIDECTOMY, PARTIAL  08/2008   Dr Ronnald Collum  . TONSILLECTOMY AND ADENOIDECTOMY  10/2004  . TRANSTHORACIC ECHOCARDIOGRAM  123456   Normal systolic function.  EF 55-60%.  Mild MR, atria ok, trivial pericardial effusion  . TRANSVAGINAL TAPE (TVT) REMOVAL  06/15/2010   Dr Mancel Bale, for urge incontinence    OB History    Gravida  2   Para      Term      Preterm      AB      Living  2     SAB      TAB      Ectopic      Multiple      Live Births               Home Medications    Prior to Admission medications   Medication Sig Start Date End Date Taking? Authorizing Provider  aspirin  325 MG tablet Take 325 mg by mouth daily.   Yes [provider]  tamoxifen (NOLVADEX) 20 MG tablet Take 1 tablet (20 mg total) by mouth daily. 01/19/19  Yes Nicholas Lose, MD  escitalopram (LEXAPRO) 10 MG tablet TAKE 2 TABLETS BY MOUTH EVERY DAY 01/11/19   Nicholas Lose, MD  loperamide (IMODIUM) 2 MG capsule Take by mouth as needed for diarrhea or loose stools (as needed for diarrhea).    [provider]  promethazine (PHENERGAN) 12.5 MG tablet Take 1 tablet (12.5 mg total) by mouth every 8 (eight) hours as needed for  nausea or vomiting. 06/15/19   Robyn Haber, MD  traZODone (DESYREL) 50 MG tablet TAKE 1/2 TO 1 TABLET AT BEDTIME AS NEEDED FOR SLEEP 07/10/18 06/15/19  Guadalupe Dawn, MD    Family History Family History  Problem Relation Age of Onset  . Heart disease Mother   . Stomach cancer Mother   . Cirrhosis Sister   . Hypertension Sister   . Heart disease Maternal Grandmother   . Heart disease Maternal Grandfather   . Colon cancer Maternal Aunt   . Other Neg Hx     Social History Social History   Tobacco Use  . Smoking status: Former Smoker    Packs/day: 0.75    Years: 40.00    Pack years: 30.00    Types: Cigarettes    Quit date: 11/2016    Years since quitting: 2.6  . Smokeless tobacco: Never Used  Substance Use Topics  . Alcohol use: No    Alcohol/week: 0.0 standard drinks  . Drug use: No     Allergies   Seroquel [quetiapine fumarate], Amoxicillin, Ciprofloxacin, Haloperidol lactate, Ketorolac tromethamine, Latuda [lurasidone hcl], Thorazine [chlorpromazine], and Wellbutrin [bupropion]   Review of Systems Review of Systems  Cardiovascular: Positive for chest pain.  Skin: Positive for wound.  All other systems reviewed and are negative.    Physical Exam Triage Vital Signs ED Triage Vitals [06/15/19 1547]  Enc Vitals Group     BP (!) 165/112     Pulse Rate 76     Resp 20     Temp 98.1 F (36.7 C)     Temp Source Oral     SpO2 98 %      Weight      Height      Head Circumference      Peak Flow      Pain Score      Pain Loc      Pain Edu?      Excl. in [place]?    No data found.  Updated Vital Signs BP (!) 165/112 (BP Location: Left Arm)   Pulse 76   Temp 98.1 F (36.7 C) (Oral)   Resp 20   LMP 01/09/2012   SpO2 98%    Physical Exam Vitals and nursing note reviewed.  Constitutional:      General: She is not in acute distress.    Appearance: Normal appearance. She is normal weight. She is ill-appearing. She is not toxic-appearing or diaphoretic.     Comments: Patient appears disheveled with hair not combed, clothes dirty, shoes taped up, and an old adhesive tape dressing on right elbow. She is evaluated in a wheelchair.   At first she said she hit her head, but later denied this.  Her daughter was angry when we offered ED evaluation saying she would get COVID in the ED.    HENT:     Head: Normocephalic and atraumatic.     Nose: Nose normal.     Mouth/Throat:     Comments: Teeth there thickly stained with yellow residue Eyes:     Extraocular Movements: Extraocular movements intact.     Conjunctiva/sclera: Conjunctivae normal.     Pupils: Pupils are equal, round, and reactive to light.  Cardiovascular:     Rate and Rhythm: Normal rate.     Heart sounds: Normal heart sounds.  Pulmonary:     Effort: Pulmonary effort is normal.     Breath sounds: Wheezing present.     Comments: Expiratory wheezes throughout lung  fields Abdominal:     General: Abdomen is flat.     Tenderness: There is no abdominal tenderness.  Musculoskeletal:        General: Normal range of motion.     Cervical back: Normal range of motion and neck supple. No tenderness.     Right lower leg: No edema.     Left lower leg: No edema.  Skin:    General: Skin is warm and dry.     Comments: 2 cm abrasion on right elbow  Neurological:     General: No focal deficit present.     Mental Status: She is alert and oriented to person, place, and  time.     Comments: Patient knows location, date, and is oriented to the staff.  She knows President is Aeronautical engineer.  She is examined while in a wheelchair.  She has a parkinsonian 3 to 5 beat per second resting tremor in her hands  Psychiatric:        Mood and Affect: Mood normal.      UC Treatments / Results  Labs (all labs ordered are listed, but only abnormal results are displayed) Labs Reviewed - No data to display  EKG   Radiology DG Ribs Unilateral W/Chest Right  Result Date: 06/15/2019 CLINICAL DATA:  Fall several days ago with right-sided chest pain, initial encounter EXAM: RIGHT RIBS AND CHEST - 3+ VIEW COMPARISON:  10/08/2015 FINDINGS: Cardiac shadow is within normal limits. Aortic calcifications are seen. The lungs are hyperinflated consistent with COPD. No infiltrate, effusion or pneumothorax is seen. No acute rib fracture is seen. IMPRESSION: No evidence of acute rib fracture. COPD without acute abnormality. Electronically Signed   By: Inez Catalina M.D.   On: 06/15/2019 16:26    Procedures Procedures (including critical care time)  Medications Ordered in UC Medications - No data to display  Initial Impression / Assessment and Plan / UC Course  I have reviewed the triage vital signs and the nursing notes.  Pertinent labs & imaging results that were available during my care of the patient were reviewed by me and considered in my medical decision making (see chart for details).    Final Clinical Impressions(s) / UC Diagnoses   Final diagnoses:  Chest wall pain  Fall in home, initial encounter  Elevated blood pressure, situational  Abrasion of right elbow, initial encounter  Chronic nausea     Discharge Instructions     Your blood pressure is elevated today.  I am concerned that you get dizzy when you stand up so I am not starting you on blood pressure medicine until you see your primary care doctor.  You should make an appointment to have this evaluated in the  next week or so.  Taking a warm shower will help.    ED Prescriptions    Medication Sig Dispense Auth. Provider   promethazine (PHENERGAN) 12.5 MG tablet Take 1 tablet (12.5 mg total) by mouth every 8 (eight) hours as needed for nausea or vomiting. 30 tablet Robyn Haber, MD     I have reviewed the PDMP during this encounter.   Robyn Haber, MD 06/15/19 [phone]

## 2019-06-15 NOTE — ED Triage Notes (Signed)
Pt presents s/p fall.  Unclear as to date of fall - pt initially stated yesterday then 5 days ago.  Initially stated she hit her head then denied that she hit her head.  Denies LOC.  C/o R elbow and R rib pain.  Painful to take deep breath.

## 2019-06-15 NOTE — ED Notes (Signed)
Patient is being discharged from the [facility] and sent to the Emergency Department via personal vehicle by family member. Per Dr Joseph Art, patient is stable but in need of higher level of care due to fall injuries. Patient is aware and verbalizes understanding of plan of care. There were no vitals filed for this visit.

## 2019-07-08 ENCOUNTER — Other Ambulatory Visit: Payer: Self-pay | Admitting: Hematology and Oncology

## 2019-07-09 ENCOUNTER — Other Ambulatory Visit: Payer: Self-pay | Admitting: Hematology and Oncology

## 2019-07-10 ENCOUNTER — Telehealth: Payer: Self-pay | Admitting: Hematology and Oncology

## 2019-07-10 NOTE — Telephone Encounter (Signed)
Scheduled appt per 4/5 sch message  Unable to reach pt - left message for patient with appt date and time

## 2019-07-22 ENCOUNTER — Other Ambulatory Visit: Payer: Self-pay | Admitting: Hematology and Oncology

## 2019-07-24 ENCOUNTER — Inpatient Hospital Stay: Payer: Medicare Other | Admitting: Hematology and Oncology

## 2019-07-24 NOTE — Assessment & Plan Note (Deleted)
05/13/2017: Left simple mastectomy: IDC grade 2, 7.7 cm, DCIS intermediate grade, lymphovascular invasion identified, perineural invasion present, margins negative, 1/2 lymph nodes positive, ER 6200% strong, PR 0%, HER-2 negative, Ki-67 5%, T3N1 a stage IIIa Patient did not want to receive radiation.  Current treatment: Tamoxifen started 05/24/2017 Tamoxifen toxicities:  Breast cancer surveillance: Breast exam 07/24/2019  Major depression: Provided her lots of encouragement and counseling.  Return to clinic in 1 year for follow-up

## 2019-08-16 ENCOUNTER — Ambulatory Visit: Payer: Medicare Other | Admitting: Licensed Clinical Social Worker

## 2019-08-16 ENCOUNTER — Other Ambulatory Visit: Payer: Self-pay

## 2019-08-16 ENCOUNTER — Ambulatory Visit (INDEPENDENT_AMBULATORY_CARE_PROVIDER_SITE_OTHER): Payer: Medicare Other | Admitting: Family Medicine

## 2019-08-16 VITALS — BP 160/80 | HR 85 | Wt 113.0 lb

## 2019-08-16 DIAGNOSIS — G301 Alzheimer's disease with late onset: Secondary | ICD-10-CM | POA: Diagnosis not present

## 2019-08-16 DIAGNOSIS — L989 Disorder of the skin and subcutaneous tissue, unspecified: Secondary | ICD-10-CM

## 2019-08-16 DIAGNOSIS — F0281 Dementia in other diseases classified elsewhere with behavioral disturbance: Secondary | ICD-10-CM

## 2019-08-16 DIAGNOSIS — Z139 Encounter for screening, unspecified: Secondary | ICD-10-CM

## 2019-08-16 DIAGNOSIS — L570 Actinic keratosis: Secondary | ICD-10-CM | POA: Diagnosis not present

## 2019-08-16 DIAGNOSIS — L814 Other melanin hyperpigmentation: Secondary | ICD-10-CM | POA: Diagnosis not present

## 2019-08-16 NOTE — Assessment & Plan Note (Signed)
Assessment: Small scaly lesion of right eyebrow. Differential can include seborrheic keratosis vs actinic keratosis. Cannot rule out squamous cell skin cancer.  Plan: - Performed punch biopsy and sent sample to pathology - Will follow up with patient in 3-4 weeks in dermatology clinic

## 2019-08-16 NOTE — Assessment & Plan Note (Signed)
Assessment: Three small scaly lesions of right upper arm that are raised in appearence.  Differential can include seborrheic keratosis vs actinic keratosis. Cannot rule out squamous cell skin cancer.  Plan: -Cryotherapy performed on each of the three lesions - Patient to return in 3-4 weeks to derm clinic to determine if this has resolved the lesions

## 2019-08-16 NOTE — Progress Notes (Signed)
    SUBJECTIVE:   CHIEF COMPLAINT / HPI:   Lesion above left eye. Patient not sure how long it's been present. Doesn't itch or hurt. Patient expresses concerns over cancer.  Lesions on right arm: - Unsure how long have been there. Don't hurt or itch. Has not tried anything to make them better or found anything that makes them worse.  PERTINENT  PMH / PSH: None  OBJECTIVE:   BP (!) 160/80   Pulse 85   Wt 113 lb (51.3 kg)   LMP 01/09/2012   SpO2 95%   BMI 24.45 kg/m   General: Alert and oriented in no apparent distress Skin: Face with scaly lesion of right eyebrow without erythema. Right arm with 3 small scaly/crusty raised lesions.   Procedure: Punch biopsy: After informed consent was obtained the area was cleaned with alcohol swabs and numbed with 1cc 1% lidocaine. Once area was numb a 76mm punch biopsy was used to obtain a sample of the lesion. This was placed in a pathology container. Area was closed with a small circular band-aid. Followup: The patient tolerated the procedure well without complications.  Standard post-procedure care is explained and return precautions are given.  Cryotherapy: After informed consent was obtained, cryotherapy was performed on each of the three areas on the right upper arm. The patient tolerated the procedure well without complications.  Standard post-procedure care is explained and return precautions are given.  ASSESSMENT/PLAN:   Facial skin lesion Assessment: Small scaly lesion of right eyebrow. Differential can include seborrheic keratosis vs actinic keratosis. Cannot rule out squamous cell skin cancer.  Plan: - Performed punch biopsy and sent sample to pathology - Will follow up with patient in 3-4 weeks in dermatology clinic   Skin lesion of right arm Assessment: Three small scaly lesions of right upper arm that are raised in appearence.  Differential can include seborrheic keratosis vs actinic keratosis. Cannot rule out squamous cell  skin cancer.  Plan: -Cryotherapy performed on each of the three lesions - Patient to return in 3-4 weeks to derm clinic to determine if this has resolved the lesions   - Plan for patient to follow up with PCP in next few days for chronic issues including blood pressure and fatigue.   Lurline Del, [place]

## 2019-08-16 NOTE — Patient Instructions (Signed)
It was great to meet you!   Our plan today: - We took a biopsy of the spot above your left eye. We will send this to pathology to determine what the cause of the spot is. - We also froze 3 little spots on your right arm. We would like for you to schedule back with dermatology clinic in 3-4 weeks to see if these spots went away. - We would also like for you to schedule an appointment with your PCP in the next 1-2 months.

## 2019-08-17 ENCOUNTER — Ambulatory Visit: Payer: Self-pay | Admitting: Licensed Clinical Social Worker

## 2019-08-17 NOTE — Chronic Care Management (AMB) (Signed)
    Clinical Social Work  Care Management Outreach   08/17/2019 Name: [name] MRN: [id number] DOB: [birth year]  [name] is a 74 y.o. year old female who is a primary care patient of Guadalupe Dawn, MD .  The Care Management team was consulted for assistance with Food Insecurity and assessment of needs  LCSW reached out to [name] today by phone for ongoing assessment of needs and offer Care Management services.  The outreach was unsuccessful. A HIPPA compliant phone message was left for the patient providing contact information and requesting a return call.   LCSW also called patient's housing case Actuary at [employer]. Left  voice message. Need to collaborate on patient's social needs.  Review of patient status, including review of consultants reports, relevant laboratory and other test results, and collaboration with appropriate care team members and the patient's provider was performed as part of comprehensive patient evaluation and provision of care management services.    Follow Up Plan: LCSW will F/U with patient and case manager at [facility] in 3 to 5 days.  Casimer Lanius, [place] / [place]   [phone] 4:19 PM

## 2019-08-17 NOTE — Chronic Care Management (AMB) (Signed)
  Care Management   Clinical Social Work initial Note  08/17/2019 Name: [name] MRN: [id number] DOB: 11-01-1945  The Care Management team was consulted by CMA to assist the patient with possible Food Insecurity. Patient agreed to services provided today and verbal consent obtained.  Dana DIFFEE is a 74 y.o. year old female who sees Guadalupe Dawn, MD for primary care. LCSW briefly met with [name] today to introduce self, assess food concerns and provide her with an [facility] foodbox.   Assessment: Patient is pleasant and engaged in conversation.  Appears a bit confused at times. Reports she has no food in her home but then starts to name all of the food she has and what she likes and doesn't like.  Patient lives in [place] does not like going down stairs to eat, does not like mobile meals and does not want the food box. Patient later stated she is unable to take the box to her apartment as she is unable to carry it.  Recommendation: Patient may benefit from a full assessment of her social needs based on having tape on her shoes, and concerns about food. Review of patient status, including review of consultants reports, relevant laboratory and other test results, and collaboration with appropriate care team members and the patient's provider was performed as part of comprehensive patient evaluation and provision of chronic care management services.   SDOH (Social Determinants of Health) assessments performed: Yes:    Outpatient Encounter Medications as of 08/16/2019  Medication Sig Note  . aspirin 325 MG tablet Take 325 mg by mouth daily. 05/03/2017: On Hold  . escitalopram (LEXAPRO) 10 MG tablet TAKE 2 TABLETS BY MOUTH EVERY DAY   . loperamide (IMODIUM) 2 MG capsule Take by mouth as needed for diarrhea or loose stools (as needed for diarrhea).   . promethazine (PHENERGAN) 12.5 MG tablet Take 1 tablet (12.5 mg total) by mouth every 8 (eight) hours as needed for nausea or vomiting.   .  tamoxifen (NOLVADEX) 20 MG tablet TAKE 1 TABLET BY MOUTH EVERY DAY   . [DISCONTINUED] traZODone (DESYREL) 50 MG tablet TAKE 1/2 TO 1 TABLET AT BEDTIME AS NEEDED FOR SLEEP    No facility-administered encounter medications on file as of 08/16/2019.    Follow Up Plan:   1. LCSW will contact [name] [phone] the case manager at [facility] to provider support for patient 2. LCSW will call patient tomorrow after 2:00 as she has requested to obtain additional information about her living situation.   Casimer Lanius, [place] / [place]   [phone] 8:25 AM

## 2019-08-17 NOTE — Addendum Note (Signed)
Addended by: Leonia Corona R on: 08/17/2019 12:02 PM   Modules accepted: Orders

## 2019-08-18 ENCOUNTER — Other Ambulatory Visit: Payer: Self-pay | Admitting: Hematology and Oncology

## 2019-08-20 ENCOUNTER — Telehealth: Payer: Self-pay | Admitting: Hematology and Oncology

## 2019-08-20 NOTE — Telephone Encounter (Signed)
Unable to reach pt. Scheduled per 5/17 sch messgae. - Left voicemail- appt on 6/17.

## 2019-08-21 ENCOUNTER — Ambulatory Visit: Payer: Self-pay | Admitting: Licensed Clinical Social Worker

## 2019-08-21 NOTE — Chronic Care Management (AMB) (Signed)
   Social Work  Care Management Collaboration 08/21/2019 Name: [name] MRN: [id number] DOB: 1945/05/14  [name] is a 74 y.o. year old female who sees Guadalupe Dawn, MD for primary care. LCSW was consulted to assistance patient with food concerns.   LCSW has not been able to contact patient by phone.  Received return call from [place] with [name].  [phone].  She has provided the following support for patient and will continue to offer her additional resources for food (  Pre-cooked meals from [employer], as well as shelf stable food boxes from [employer]. She also purchased 3 pair of shoes for patient, however patient refuses to wear the shoes.  Patient continues to wear current shoes with the toes taped.    Recommendation: After collaboration with Hester Mates case manager it is determined that patient may benefit from having PCS support.  LCSW will contact her daughter for additional information.  Intervention: Patient was not interviewed or contacted during this encounter.  LCSW collaborated with [employer]. Review of patient status, including review of consultants reports, relevant laboratory and other test results, and collaboration with appropriate care team members and the patient's provider was performed as part of comprehensive patient evaluation and provision of chronic care management services.    Plan:  1. [employer] case manager will make sure patient has food 2. LCSW will continue to reach out to patient and daughter to assess other needs  [name], [place] / [place]   [phone] 10:02 AM

## 2019-08-22 ENCOUNTER — Telehealth: Payer: Self-pay | Admitting: Family Medicine

## 2019-08-22 NOTE — Telephone Encounter (Signed)
HIPAA compliant callback message left.   Note: biopsy is benign. She will need Derm clinic f/u for full skin exam and biopsy of lesions on her chest.  I will route to her PCP to follow-up with her on this.

## 2019-08-23 ENCOUNTER — Encounter: Payer: Self-pay | Admitting: Licensed Clinical Social Worker

## 2019-08-23 ENCOUNTER — Ambulatory Visit: Payer: Self-pay | Admitting: Licensed Clinical Social Worker

## 2019-08-23 NOTE — Chronic Care Management (AMB) (Signed)
   Clinical Social Work  Care Management referral   08/23/2019 Name: [name] MRN: [id number] DOB: 1945-10-02  [name] is a 74 y.o. year old female who is a primary care patient of Guadalupe Dawn, MD .  2nd outreach to patient. Left voice message to call LCSW.  LSCW returned call from patient's daughter.  POA (per daughter)  Assessment:  Reports patient's needs are met.  She has new shoes but refuses to wear them. Patient sleeps during the day and is up at night which is why it is difficult to reach her. Daughter checks with patient weekly, purchases food and gets medications.  Reports patient does fine living alone.  Patient is very independent and not open to help with personal care needs.  She has patient set up for the PACE program however patient refused to go.  Concerns:  Only concerns daughter expressed is that patient is having difficulty hearing and needs to have her hearing checked.  Daughter also states patient needs a wheelchair.  LCSW will share concerns with PCP to address during next office visit. SDOH (Social Determinants of Health) assessments performed: Yes: no concerns per daughter.  Housing Resident case manager will also provide patient with prepared meals.    Review of patient status, including review of consultants reports, relevant laboratory and other test results, and collaboration with appropriate care team members and the patient's provider was performed as part of comprehensive patient evaluation and provision of care management services.    Plan:  1. No follow up scheduled  2. Provided daughter with direct phone number for LCSW.  She will call if needed.  Casimer Lanius, [place] / [place]   [phone] 3:23 PM

## 2019-09-05 ENCOUNTER — Telehealth: Payer: Self-pay | Admitting: Hematology and Oncology

## 2019-09-05 NOTE — Telephone Encounter (Signed)
Rescheduled appt on 6/17 to 6/22. Provider on PAL. Unable to reach pt. Left voicemail.

## 2019-09-13 ENCOUNTER — Other Ambulatory Visit: Payer: Self-pay | Admitting: Hematology and Oncology

## 2019-09-20 ENCOUNTER — Ambulatory Visit: Payer: Medicare Other | Admitting: Hematology and Oncology

## 2019-09-21 ENCOUNTER — Ambulatory Visit: Payer: Medicare Other | Admitting: Family Medicine

## 2019-09-25 ENCOUNTER — Ambulatory Visit: Payer: Medicare Other | Admitting: Hematology and Oncology

## 2019-09-25 NOTE — Assessment & Plan Note (Deleted)
positive ([place]) 05/13/2017: Left simple mastectomy: IDC grade 2, 7.7 cm, DCIS intermediate grade, lymphovascular invasion identified, perineural invasion present, margins negative, 1/2 lymph nodes positive, ER 6200% strong, PR 0%, HER-2 negative, Ki-67 5%, T3N1 a stage IIIa  Plan: Patient is extremely frail and has major depression issues. Therefore radiation was not given.  Current treatment: Adjuvant tamoxifen Tamoxifen toxicities:  Breast cancer surveillance:

## 2019-09-26 ENCOUNTER — Ambulatory Visit: Payer: Medicare Other | Admitting: Family Medicine

## 2019-11-17 ENCOUNTER — Other Ambulatory Visit: Payer: Self-pay | Admitting: Hematology and Oncology

## 2019-11-27 ENCOUNTER — Other Ambulatory Visit: Payer: Self-pay | Admitting: Hematology and Oncology

## 2020-01-06 ENCOUNTER — Other Ambulatory Visit: Payer: Self-pay | Admitting: Hematology and Oncology

## 2020-01-07 ENCOUNTER — Other Ambulatory Visit: Payer: Self-pay | Admitting: Hematology and Oncology

## 2020-01-24 ENCOUNTER — Other Ambulatory Visit: Payer: Self-pay | Admitting: *Deleted

## 2020-01-24 DIAGNOSIS — R110 Nausea: Secondary | ICD-10-CM

## 2020-01-24 NOTE — Telephone Encounter (Signed)
Patient called asking for a refill on her promethazine.  She will be calling back to make an appt to meet her new doctor as she has to find a ride first.  [facility]

## 2020-01-28 MED ORDER — PROMETHAZINE HCL 12.5 MG PO TABS: 12.5000 mg | ORAL_TABLET | Freq: Three times a day (TID) | ORAL | 2 refills | Status: DC | PRN

## 2020-01-29 ENCOUNTER — Telehealth: Payer: Self-pay | Admitting: *Deleted

## 2020-01-29 NOTE — Telephone Encounter (Signed)
Received after hours message from pt requesting yearly f/u apt with MD.  Attempt x1 to return call to pt.  No answer, LVM to return call to the office and ask to speak with scheduling to arrange apt.

## 2020-01-31 ENCOUNTER — Telehealth: Payer: Self-pay | Admitting: Hematology and Oncology

## 2020-01-31 NOTE — Telephone Encounter (Signed)
Called pt per 10/26 sch msg - no answer. Left message for patient to call back to schedule yearly f/u

## 2020-02-03 ENCOUNTER — Other Ambulatory Visit: Payer: Self-pay | Admitting: Hematology and Oncology

## 2020-02-06 ENCOUNTER — Telehealth: Payer: Self-pay | Admitting: *Deleted

## 2020-02-06 ENCOUNTER — Telehealth: Payer: Self-pay

## 2020-02-06 NOTE — Telephone Encounter (Signed)
Received fax from pharmacy, PA needed on Promethazine. Clinical questions submitted via Cover My Meds. Waiting on response, could take up to 72 hours.  Cover My Meds info: Key: [id number]

## 2020-02-06 NOTE — Telephone Encounter (Signed)
Received VM from pt regarding scheduling yearly f/u with MD as a telephone visit.  Attempt x1 to contact pt to schedule apt.  No answer, LVM to return call to the office and ask to speak with scheduling to set up phone visit.

## 2020-02-14 NOTE — Telephone Encounter (Signed)
Hi sorry I'm not familiar with this. Is there something I need to do on my end? Thank you

## 2020-02-20 ENCOUNTER — Other Ambulatory Visit: Payer: Self-pay | Admitting: Hematology and Oncology

## 2020-03-22 ENCOUNTER — Other Ambulatory Visit: Payer: Self-pay | Admitting: Hematology and Oncology

## 2020-03-24 ENCOUNTER — Telehealth: Payer: Self-pay | Admitting: Hematology and Oncology

## 2020-03-24 NOTE — Telephone Encounter (Signed)
Called pt per 12/20 sch msg - no answer. Left message for patient to call back to set up f/u appt.

## 2020-03-25 ENCOUNTER — Inpatient Hospital Stay: Payer: Medicare Other | Attending: Hematology and Oncology | Admitting: Hematology and Oncology

## 2020-03-25 DIAGNOSIS — Z170 Estrogen receptor positive status [ER+]: Secondary | ICD-10-CM

## 2020-03-25 DIAGNOSIS — C50812 Malignant neoplasm of overlapping sites of left female breast: Secondary | ICD-10-CM

## 2020-03-25 MED ORDER — TAMOXIFEN CITRATE 20 MG PO TABS: 20.0000 mg | ORAL_TABLET | Freq: Every day | ORAL | 3 refills | Status: DC

## 2020-03-25 NOTE — Progress Notes (Signed)
HEMATOLOGY-ONCOLOGY TELEPHONE VISIT PROGRESS NOTE  I connected with _0 @ on 03/25/20 at  4:30 PM EST by telephone and verified that I am speaking with the correct person using two identifiers.  I discussed the limitations, risks, security and privacy concerns of performing an evaluation and management service by telephone and the availability of in person appointments.  I also discussed with the patient that there may be a patient responsible charge related to this service. The patient expressed understanding and agreed to proceed.   History of Present Illness: Severe fatigue, sleeps all day.  Patient has a very difficult situation where she does not have any family or friends who can help her with daily food or other forms of assistance.  She is too weak to go anywhere by herself and she is completely homebound.  She cannot drive either.  Her daughter lives in [place] who comes once a month and provides her with some groceries.  She mostly eats peanut butter and jelly sandwich is multiple meals most of the time.  She sleeps through the night and therefore she does not answer her phone calls during the daytime because she is sleeping at times she can sleep up to 2 to 3 days at a stretch.  Oncology History  Malignant neoplasm of overlapping sites of left breast in female, estrogen receptor positive ([place])  03/04/2017 Initial Diagnosis   Palpable left breast masses with skin thickening, by ultrasound 2 masses were noted at 5:00 2.1 cm and at 1:00 3.5 cm, biopsy of both of these lesions grade 2 invasive ductal carcinoma with lymphovascular invasion and perineural invasion, ER 100%, PR 0%, HER-2 negative ratio 1.03, Ki-67 5%, T2 N0 stage II a clinical stage   05/13/2017 Surgery   Left simple mastectomy: IDC grade 2, 7.7 cm, DCIS intermediate grade, lymphovascular invasion identified, perineural invasion present, margins negative, 1/2 lymph nodes positive, ER 6200% strong, PR 0%, HER-2 negative, Ki-67 5%,  T3N1 a stage IIIa    Radiation Therapy   declined   05/24/2017 -  Anti-estrogen oral therapy   Tamoxifen 51m daily      Assessment Plan:  Malignant neoplasm of overlapping sites of left breast in female, estrogen receptor positive ([place] 05/13/2017: Left simple mastectomy: IDC grade 2, 7.7 cm, DCIS intermediate grade, lymphovascular invasion identified, perineural invasion present, margins negative, 1/2 lymph nodes positive, ER 6200% strong, PR 0%, HER-2 negative, Ki-67 5%, T3N1 a stage IIIa Has major depression: On Lexapro  Sleeps all day and wakes up all night. Severe fatigue limiting all activities Did not respond to multiple calls Profound weight loss.  Taking Tamoxifen: No Adverse effects Has no friends or family to help her. She needs a mammogram on right breast but doesn't have anyone to take her. Her daughter lives in [place]  [place].  She tells me that the Meals on Wheels program is no longer helping seniors.  We will figure out what other options she may have.  Telephone visit once a year.  I discussed the assessment and treatment plan with the patient. The patient was provided an opportunity to ask questions and all were answered. The patient agreed with the plan and demonstrated an understanding of the instructions. The patient was advised to call back or seek an in-person evaluation if the symptoms worsen or if the condition fails to improve as anticipated.   I provided 20 minutes of non-face-to-face time during this encounter. VHarriette Ohara MD

## 2020-03-25 NOTE — Assessment & Plan Note (Addendum)
05/13/2017: Left simple mastectomy: IDC grade 2, 7.7 cm, DCIS intermediate grade, lymphovascular invasion identified, perineural invasion present, margins negative, 1/2 lymph nodes positive, ER 6200% strong, PR 0%, HER-2 negative, Ki-67 5%, T3N1 a stage IIIa Has major depression: On Lexapro  Sleeps all day and wakes up all night. Severe fatigue limiting all activities Did not respond to multiple calls Profound weight loss.  Taking Tamoxifen: No Adverse effects Has no friends or family to help her. She needs a mammogram on right breast but doesn't have anyone to take her. Her daughter lives in [place].  Telephone visit once a year.

## 2020-03-26 ENCOUNTER — Telehealth: Payer: Self-pay | Admitting: Hematology and Oncology

## 2020-03-26 ENCOUNTER — Encounter: Payer: Self-pay | Admitting: *Deleted

## 2020-03-26 ENCOUNTER — Telehealth: Payer: Self-pay | Admitting: Licensed Clinical Social Worker

## 2020-03-26 ENCOUNTER — Ambulatory Visit: Payer: Self-pay | Admitting: Licensed Clinical Social Worker

## 2020-03-26 ENCOUNTER — Telehealth: Payer: Self-pay | Admitting: *Deleted

## 2020-03-26 NOTE — Chronic Care Management (AMB) (Signed)
   Social Work  Care Management Collaboration 03/26/2020 Name: [name] MRN: [id number] DOB: 07-Mar-1946 [name] is a 74 y.o. year old female who sees Shary Key, DO for primary care. LCSW was consulted by Ronaldo Miyamoto at [facility] reference concerns related to possible food insecurities. Lowella Grip called patient and unable to reach her.  Patient recently had a visit with provider at the [facility].  Recommendation: After collaboration with Lowella Grip it is determined that patient may benefit from APS referral if she believes patient is in danger and in need of protect or self neglect.    Intervention: Patient was not interviewed or contacted during this encounter.  LCSW collaborated with Lowella Grip, Provided [employer] with phone number for case Freight forwarder at [employer].  LCSW also called Traci who confirmed she has spoken with patient last week, and states that patient still receives food boxes as well as pre-cooked food to warm up.  Based on conversation with Traci there are no food insecurities.   Review of patient status, including review of consultants reports, relevant laboratory and other test results, and collaboration with appropriate care team members and the patient's provider was performed as part of comprehensive patient evaluation and provision of chronic care management services.    Plan: Traci with [name] will go check on patient today. Will consult LCSW if needed.  No follow up need by LCSW at this time.  Casimer Lanius, [place] / [place]   [phone] 9:28 AM

## 2020-03-26 NOTE — Telephone Encounter (Signed)
Received after hours message from pt stating she is experiencing increase in thoughts with death and dying but denying suicidal thoughts.  RN attempt x2 to contact pt.  No answer, LVM to return call to the office.

## 2020-03-26 NOTE — Progress Notes (Signed)
Received call from case manager Olivia Mackie who states she went to the pts residence today to check on her and pt was okay.  Pt verbalized to Olivia Mackie no current thought of death, dying, or suicide.

## 2020-03-26 NOTE — Telephone Encounter (Signed)
Scheduled follow-up appointment. Left message for patient to call back to reschedule if date/time does not work.

## 2020-03-26 NOTE — Telephone Encounter (Signed)
[place] Work  Clinical Social Work was referred by Therapist, sports for assessment of psychosocial needs related to food. Per RN & MD, pt is unable to go grocery shopping due to fatigue, daughter drops off food about 1x/ month, and used to have meals delivered to home, but not currently.   Clinical Social Worker attempted to contact patient by phone  to offer support and assess for needs.  Potentially could refer to Meals on Wheels (has a waitlist) or PACE program although pt has declined in the past.  No answer. CSW left VM requesting patient call back.      [place], [place] Worker [employer]

## 2020-03-26 NOTE — Progress Notes (Signed)
During pt telephone visit with MD yesterday, pt expressed that she has difficulty receiving food and does not have any local family or friends to help her. RN received after hours message from pt last night stating she has increase thoughts of death and dying but denies suicidal thoughts. This RN reached out to [place] who has been in contact with pt in the past.  Neoma Laming states pt lives in [place] which is a type of dorm room for the elderly.  Neoma Laming also states [name] has a Tourist information centre manager on site, Olivia Mackie [phone]) who is in constant contact with patient.  CSW states she will attempt to reach out to pt to offer assistance.  RN placed call to [facility] case Freight forwarder who states she will go and check on the patient today. Olivia Mackie also states she provides pt with weekly boxes of food and pt has denied hot meal services in the past but will offer the services again. RN will f/u with Olivia Mackie later today to ensure safety of pt.

## 2020-04-23 ENCOUNTER — Other Ambulatory Visit: Payer: Self-pay

## 2020-04-23 ENCOUNTER — Encounter: Payer: Self-pay | Admitting: Family Medicine

## 2020-04-23 ENCOUNTER — Ambulatory Visit (INDEPENDENT_AMBULATORY_CARE_PROVIDER_SITE_OTHER): Payer: Medicare Other | Admitting: Family Medicine

## 2020-04-23 VITALS — BP 160/94 | HR 80 | Ht <= 58 in

## 2020-04-23 DIAGNOSIS — E039 Hypothyroidism, unspecified: Secondary | ICD-10-CM

## 2020-04-23 DIAGNOSIS — Z1159 Encounter for screening for other viral diseases: Secondary | ICD-10-CM | POA: Diagnosis not present

## 2020-04-23 NOTE — Progress Notes (Unsigned)
SUBJECTIVE:   CHIEF COMPLAINT / HPI:   [name] is a 75 yo who presents for concerns of skin lesions.  Skin lesions: Patient complains of skin lesions on her right lower leg over her shin and calf, a lesion on her right buttock, and on her face beside her left nose.  She was previously seen in dermatology clinic on May 13 where she had cryotherapy performed for 3 lesions, and a biopsy for lesion on her right eyebrow.  She denies any pain or itching of any of the lesions.  Reports that she noticed the lesions on her lower leg and buttock few weeks ago, and the lesion on her nose a couple of months ago.  She is requesting removal of her skin lesions and to do as much as possible in the clinic today since she states it is difficult for her to get to the clinic.   BP: Blood pressure 160/94 when she arrived in the clinic today, and on repeat it was 148/90.  She does not remember ever taking the medication for her blood pressure.  She does not endorse any chest pain, or acute headaches or vision changes.  Does endorse chronic right eye dryness and irritation after her cataract surgery.  Fatigue: Patient reports fatigue, stating she cannot get up to do much as she does not have the energy to do anything.  She endorses pain tired all of the time.  She also endorses hair loss. She is requesting to have her thyroid checked.   Ankle pain: Patient states that her right ankle is painful when she stands up and puts pressure on it.  Does not bother her or cause her pain when she is sitting or lying. States this started bothering her a few weeks ago.  She denies any inciting injury.  States she takes an aspirin daily which helps with the pain  PERTINENT  PMH / PSH:  Hx SK, hypothyroidism, HTN  OBJECTIVE:   LMP 01/09/2012    Physical exam  General: well appearing, NAD HEENT: No thyromegaly appreciated  Cardiovascular: RRR, no murmurs Lungs: CTAB. Normal WOB Abdomen: soft, non-distended,  non-tender MSK: R ankle without erythema or edema. Non tender to palpation. Normal ROM Skin: warm, dry. No edema. On R anterior leg scaly lesion on top of a hyperpigmented/erythematous base. On R buttock hyperpigmented papule, and on L side of nose skin colored papule             ASSESSMENT/PLAN:   No problem-specific Assessment & Plan notes found for this encounter.   Skin lesion Scaly slightly raised lesion on R anterior leg likely AK vs potential SCC. Hyperpigmented papule on R buttock, and skin colored papule on L side of nose less concerning, but patient requesting removal - return to derm clinic for cryotherapy and biopsy of lesions   Hypothyroidism Patient endorses fatigue, low energy, and hair loss. Last TSH checked in 2016 - TSH, CBC, BMP - consider starting synthroid based on results  HTN BP in on arrival 160/94. Repeat BP 148/90. Asymptomatic. Discussed starting BP medication and patient requests to wait until follow up visit for recheck and potentially start at that time - f/u in clinic 2 weeks or when able to   Ankle pain Endorses R ankle pain when standing and putting pressure on it. Non tender to palpation. Declines imaging. Would like conservative management for now and will return of pain does not improve.   Health maintenance Did not have vaccines at time  of appointment due to inclement weather. Offer COVID vaccine at follow up visit  - Hep C screen   Patient to f/u with derm clinic and with myself in the next 2 weeks if she is able to get to the clinic to f/u up some of her chronic conditions (HTN, hypothyroidism, etc).    [place]

## 2020-04-23 NOTE — Patient Instructions (Signed)
It was great seeing you today! Today we examined your skin lesions, discussed your blood pressure, and will get your blood work done. I will call you with any abnormal results.  Please check-out at the front desk before leaving the clinic. Please schedule an appointment with the dermatology clinic to remove your skin lesions. I would also like to see you again in 2 weeks for a follow up appointment, but if you can only come in once we can recheck your blood pressure when you come in to the derm clinic, and if still elevated we can start you on a blood pressure medication. If you need to be seen earlier than that for any new issues we're happy to fit you in, just give [place] a call!  Regarding lab work today:  Due to recent changes in healthcare laws, you may see the results of your imaging and laboratory studies on MyChart before your provider has had a chance to review them.  I understand that in some cases there may be results that are confusing or concerning to you. Not all laboratory results come back in the same time frame and you may be waiting for multiple results in order to interpret others.  Please give [place] 72 hours in order for your provider to thoroughly review all the results before contacting the office for clarification of your results. If everything is normal, you will get a letter in the mail or a message in My Chart. Please give [place] a call if you do not hear from [place] after 2 weeks.  Feel free to call with any questions or concerns at any time, at [phone].   Take care,  Dr. Shary Key [facility]

## 2020-04-24 ENCOUNTER — Other Ambulatory Visit: Payer: Self-pay

## 2020-04-24 ENCOUNTER — Ambulatory Visit (INDEPENDENT_AMBULATORY_CARE_PROVIDER_SITE_OTHER): Payer: Medicare Other | Admitting: Family Medicine

## 2020-04-24 DIAGNOSIS — J3489 Other specified disorders of nose and nasal sinuses: Secondary | ICD-10-CM

## 2020-04-24 DIAGNOSIS — L720 Epidermal cyst: Secondary | ICD-10-CM

## 2020-04-24 DIAGNOSIS — L989 Disorder of the skin and subcutaneous tissue, unspecified: Secondary | ICD-10-CM | POA: Diagnosis not present

## 2020-04-24 LAB — CBC
Hematocrit: 41.9 % (ref 34.0–46.6)
Hemoglobin: 14.1 g/dL (ref 11.1–15.9)
MCH: 33.1 pg — ABNORMAL HIGH (ref 26.6–33.0)
MCHC: 33.7 g/dL (ref 31.5–35.7)
MCV: 98 fL — ABNORMAL HIGH (ref 79–97)
Platelets: 209 10*3/uL (ref 150–450)
RBC: 4.26 x10E6/uL (ref 3.77–5.28)
RDW: 12.4 % (ref 11.7–15.4)
WBC: 4.7 10*3/uL (ref 3.4–10.8)

## 2020-04-24 LAB — TSH: TSH: 145 u[IU]/mL — ABNORMAL HIGH (ref 0.450–4.500)

## 2020-04-24 LAB — BASIC METABOLIC PANEL
BUN/Creatinine Ratio: 13 (ref 12–28)
BUN: 12 mg/dL (ref 8–27)
CO2: 25 mmol/L (ref 20–29)
Calcium: 8.9 mg/dL (ref 8.7–10.3)
Chloride: 105 mmol/L (ref 96–106)
Creatinine, Ser: 0.9 mg/dL (ref 0.57–1.00)
GFR calc Af Amer: 72 mL/min/{1.73_m2} (ref >59)
GFR calc non Af Amer: 62 mL/min/{1.73_m2} (ref >59)
Glucose: 92 mg/dL (ref 65–99)
Potassium: 4.2 mmol/L (ref 3.5–5.2)
Sodium: 141 mmol/L (ref 134–144)

## 2020-04-24 LAB — HCV AB W REFLEX TO QUANT PCR: HCV Ab: <0.1 s/co ratio (ref 0.0–0.9)

## 2020-04-24 LAB — HCV INTERPRETATION

## 2020-04-24 NOTE — Patient Instructions (Signed)
Thank you for coming to see me today. It was a pleasure. Today we discussed the lesions on your legs. They are most probably because of bleeding. We applied a bandaid in the clinic. You can apply anti-bacterial cream to them daily and dress them with a bandaid. Would also recommend applying a moisturizer or vaseline to your legs and keeping them covered with socks.  Please follow-up with me Dr Arby Barrette in 1 month for the lesion on your nose. We did not think it needed biopsying today in the clinic.   If you have any questions or concerns, please do not hesitate to call the office at [phone].  If you develop fevers>100.5, shortness of breath, chest pain, palpitations, dizziness, abdominal pain, nausea, vomiting, diarrhea or cannot eat or drink then please go to the ER immediately.  Best wishes,   Dr Posey Pronto

## 2020-04-24 NOTE — Assessment & Plan Note (Signed)
3 mm inclusion cyst over right buttock removed by squeezing it and applying gentle pressure. Reassured patient that this is benign.

## 2020-04-24 NOTE — Assessment & Plan Note (Signed)
Patient had multiple areas of ecchymosis and bleeding over the right leg and crusting lesion over the anterior leg which was easily removed by hand in clinic today. The crusting lesion could be actinic keratosis or squamous cell carcinoma. The surrounding ecchymosis is likely due to to taking aspirin 325 mg on most days for generalized body pains. She denies history of stroke or MI. CBC checked yesterday, platelets were 209. Recommended the patient switches to Tylenol for analgesia due to the concerns of bleeding with aspirin however she had concerns about this causing liver damage due to liver cirrhosis running in the family. Instead we recommended reducing the dose. Would recommend follow-up of lesions over the legs. Would consider biopsy if the crusting lesion reappears.

## 2020-04-24 NOTE — Assessment & Plan Note (Addendum)
Most likely benign skin lesion on the nose and low suspicion for basal cell carcinoma/squamous of carcinoma. Recommended that patient follows up with PCP and keep an eye on the size of the lesion. Would consider biopsying if it changes in nature i.e. crusting, telangiectasia or grows in size or color. Reassured patient and she expressed understanding and is happy with the plan.

## 2020-04-24 NOTE — Progress Notes (Signed)
     SUBJECTIVE:   CHIEF COMPLAINT / HPI:   [name] is a 75 y.o. female presents with multiple skin concerns today  Lesion over left nose Patient noticed a skin colored lesion over the left nose recently and is concerned it is skin cancer. Denies history of skin cancer before  Lesion of anterior right leg Patient noticed a dark colored lesion over the right anterior leg a few days ago and thinks this is cancer and would like it removed. Her skin around this has been bleeding. She denies history of trauma but does take aspirin 325 mg daily for generalized body pains.  Right buttock lesion Patient pointed to a small dark lesion on her right buttock and wanted to know what this was and whether we can remove it.  PERTINENT  PMH / PSH:   OBJECTIVE:   BP (!) 155/92   Pulse 80   Ht 4\' 6"  (1.372 m)   LMP 01/09/2012   SpO2 99%   BMI 27.25 kg/m    General: Alert, no acute distress, pleasant, disheveled Cardio: Well-perfused Pulm: Cnormal work of breathing Extremities: No peripheral edema.  Neuro: Cranial nerves grossly intact  Skin: Nose-4 mm skin colored bump over left nose. No evidence of pearly skin, telangiectasia, crusting etc. Anterior right leg-crusting of skin surrounded by ecchymosis, crusting and skin was easily removed Posterior right leg-multiple areas of ecchymosis and 1 scab which easily bleeds         ASSESSMENT/PLAN:   Lesion of nose Most likely benign skin lesion on the nose and low suspicion for basal cell carcinoma/squamous of carcinoma. Recommended that patient follows up with PCP and keep an eye on the size of the lesion. Would consider biopsying if it changes in nature i.e. crusting, telangiectasia or grows in size or color. Reassured patient and she expressed understanding and is happy with the plan.  Skin lesion of right leg Patient had multiple areas of ecchymosis and bleeding over the right leg and crusting lesion over the anterior leg which  was easily removed by hand in clinic today. The crusting lesion could be actinic keratosis or squamous cell carcinoma. The surrounding ecchymosis is likely due to to taking aspirin 325 mg on most days for generalized body pains. She denies history of stroke or MI. CBC checked yesterday, platelets were 209. Recommended the patient switches to Tylenol for analgesia due to the concerns of bleeding with aspirin however she had concerns about this causing liver damage due to liver cirrhosis running in the family. Instead we recommended reducing the dose. Would recommend follow-up of lesions over the legs. Would consider biopsy if the crusting lesion reappears.  Inclusion cyst 3 mm inclusion cyst over right buttock removed by squeezing it and applying gentle pressure. Reassured patient that this is benign.     Lattie Haw, MD PGY-2 [place]

## 2020-05-02 ENCOUNTER — Telehealth: Payer: Self-pay | Admitting: Family Medicine

## 2020-05-02 NOTE — Telephone Encounter (Signed)
Patient is calling stating she is wanting her results mailed to her. Thanks

## 2020-05-02 NOTE — Telephone Encounter (Signed)
Patient calls nurse line wanting to know recent lab results. I believe she saw her TSH on mychart and wants to know the plan. Will forward to PCP.

## 2020-05-05 NOTE — Telephone Encounter (Signed)
Thanks I have left her voice mails I will try again later today.

## 2020-05-21 ENCOUNTER — Other Ambulatory Visit: Payer: Self-pay

## 2020-05-21 ENCOUNTER — Ambulatory Visit (INDEPENDENT_AMBULATORY_CARE_PROVIDER_SITE_OTHER): Payer: Medicare Other

## 2020-05-21 ENCOUNTER — Ambulatory Visit (HOSPITAL_COMMUNITY): Payer: Medicare Other

## 2020-05-21 ENCOUNTER — Ambulatory Visit (HOSPITAL_COMMUNITY)
Admission: EM | Admit: 2020-05-21 | Discharge: 2020-05-21 | Disposition: A | Discharge status: Home / Self Care | Payer: Medicare Other | Attending: Family Medicine | Admitting: Family Medicine

## 2020-05-21 ENCOUNTER — Encounter (HOSPITAL_COMMUNITY): Payer: Self-pay

## 2020-05-21 DIAGNOSIS — M25571 Pain in right ankle and joints of right foot: Secondary | ICD-10-CM

## 2020-05-21 DIAGNOSIS — G8929 Other chronic pain: Secondary | ICD-10-CM

## 2020-05-21 DIAGNOSIS — M2550 Pain in unspecified joint: Secondary | ICD-10-CM | POA: Diagnosis not present

## 2020-05-21 DIAGNOSIS — M85861 Other specified disorders of bone density and structure, right lower leg: Secondary | ICD-10-CM | POA: Diagnosis not present

## 2020-05-21 DIAGNOSIS — I83893 Varicose veins of bilateral lower extremities with other complications: Secondary | ICD-10-CM | POA: Diagnosis not present

## 2020-05-21 DIAGNOSIS — I739 Peripheral vascular disease, unspecified: Secondary | ICD-10-CM | POA: Diagnosis not present

## 2020-05-21 MED ORDER — MELOXICAM 7.5 MG PO TABS: 7.5000 mg | ORAL_TABLET | Freq: Every day | ORAL | 0 refills | Status: DC | PRN

## 2020-05-21 NOTE — Discharge Instructions (Addendum)
Wear compression stockings at least to the knee on both legs daily, elevate your legs at rest  Take meloxicam daily as needed for your joint pains Follow up with your Primary Care provider as soon as able to re-check your concerns.

## 2020-05-21 NOTE — ED Triage Notes (Signed)
Pt presents with multiple complaints. States she has bilateral shoulder pain , and right leg pain, pt also has lump on back of neck ans skin lesions that she has seen dermatology for.

## 2020-05-21 NOTE — ED Provider Notes (Signed)
[place]    CSN: [id number] Arrival date & time: 05/21/20  6568      History   Chief Complaint Chief Complaint  Patient presents with  . Leg Pain  . Shoulder Pain    HPI [name] is a 75 y.o. female.   Presenting today with pain in b/l shoulders right ankle up to knee, hips, and weakness/stiffness when trying to get up and down. Denies injury, redness, swelling in joints and states this has been ongoing for years but right ankle is a bit worse than usual at the moment. She also has an area of medial right lower leg that swells up when she has her leg down or stands for too long and states her legs feel heavy and tired. Denies redness, hx of blood clots, diffuse swelling, recent surgeries or sedentary behaviors out of the ordinary. Takes aspirin daily but otherwise has not tried anything for these sxs.       Past Medical History:  Diagnosis Date  . Anxiety   . Arthritis   . Cancer [facility])    breast cancer  . COPD (chronic obstructive pulmonary disease) (HCC)    Due to long history of tobacco abuse, still smoking  . Depression   . Diverticulosis of colon   . GERD (gastroesophageal reflux disease)   . Heart murmur    not sure never been evaluated  . History of uterine prolapse 10/2004   Dr Cletis Media  . Hypertension    not on any medications  . Hypothyroidism   . OCD (obsessive compulsive disorder)   . Osteoporosis   . PUD (peptic ulcer disease) 09/2006   H pylori  . Thyroid disease    Ho R thyroid noduel plus mild hyperthyroidism. Treated with radioiodine therapty on 04/2006. Dr Estrella Myrtle.    Patient Active Problem List   Diagnosis Date Noted  . Skin lesion of right leg 04/24/2020  . Inclusion cyst 04/24/2020  . Skin lesion of right arm 08/16/2019  . Sleep disturbance 06/13/2018  . Health education 06/13/2018  . Chronic left hip pain 12/29/2017  . Late onset Alzheimer's disease with behavioral disturbance ([place]) 12/29/2017  . Cancer of left breast,  stage 1, estrogen receptor positive ([place]) 05/13/2017  . Malignant neoplasm of overlapping sites of left breast in female, estrogen receptor positive ([place]) 03/08/2017  . Lesion of nose 03/02/2017  . Callus of foot 11/20/2016  . Loss of appetite 12/29/2015  . Fatigue 12/29/2015  . Bipolar affective disorder, currently depressed, moderate ([place]) 11/26/2015  . Severe episode of recurrent major depressive disorder, without psychotic features ([place])   . Foreign body in foot, right 01/09/2015  . Blister of right ankle without infection 01/09/2015  . Sinusitis 11/26/2014  . Rotator cuff syndrome of right shoulder 11/14/2014  . Varicose veins of both lower extremities with pain 10/29/2014  . Neck pain 09/26/2014  . Metatarsal fracture 08/16/2014  . Pain in joint, shoulder region 08/16/2014  . Facial skin lesion 08/01/2014  . Leg edema, left 07/25/2014  . Edema of left lower extremity 07/22/2014  . Essential hypertension   . Thyroid activity decreased   . HLD (hyperlipidemia)   . Healthcare maintenance 05/31/2014  . Emotional neurotic disorder 05/07/2014  . Seborrheic keratosis 12/05/2013  . 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 Mass of left side of neck 10/17/2012  . At high risk for falls 06/28/2012  . Allergic rhinitis 06/16/2012  . Mass of left breast 10/27/2011  . Hearing loss 01/07/2011  .  Foot pain 11/25/2010  . Thyroid nodule 10/16/2010  . Nausea 09/22/2010  . URINARY INCONTINENCE, STRESS, FEMALE 04/03/2010  . HYPERLIPIDEMIA 03/25/2010  . PULMONARY NODULE 12/23/2009  . COPD (chronic obstructive pulmonary disease) ([place]) 12/03/2009  . Tobacco abuse counseling 11/05/2009  . CVA 11/05/2009  . BIPOLAR DISORDER UNSPECIFIED 11/28/2008  . Hypothyroidism 05/06/2008  . OCD (obsessive compulsive disorder) 10/13/2006  . GERD 10/13/2006  . OSTEOPOROSIS 10/13/2006    Past Surgical History:  Procedure Laterality Date  . ABDOMINAL HYSTERECTOMY    . BIOPSY THYROID  08/2008    Atypia and Hurtle cells, partial thyroidectomy   . BLADDER SUSPENSION     mesh sling  . Carotid ultrasound  09/10/09   No significant extracranial carotid artery stenosis, vertebrals are patent with antegrade flow.   [name] CATARACT EXTRACTION Right   . COLONOSCOPY  06/03/2005   Hyperplastic polyps, sigmoid diverticulosis, internal hemorroids  . EYE SURGERY     laser surgery does not know which eye  . FOREIGN BODY REMOVAL Right 02/21/2015   Procedure: RIGHT FOOT FOREIGN BODY REMOVAL ;  Surgeon: Marchia Bond, MD;  Location: [place];  Service: Orthopedics;  Laterality: Right;  . MASTECTOMY COMPLETE / SIMPLE W/ SENTINEL NODE BIOPSY Left 05/13/2017  . MASTECTOMY W/ SENTINEL NODE BIOPSY Left 05/13/2017   Procedure: LEFT TOTAL MASTECTOMY WITH AXILLARY SENTINEL LYMPH NODE BIOPSY;  Surgeon: Excell Seltzer, MD;  Location: [place];  Service: General;  Laterality: Left;  . MRI Head  09/20/09   No acute intracranial abn.  Signal abnormality suggestive of chronic small vessel ischemia, maximal in the right subinsular white and deep gray mater.   [name] PFT  11/2006   Obstructive pattern.  Poor response to bronchodilators.   . THYROIDECTOMY, PARTIAL  08/2008   Dr Ronnald Collum  . TONSILLECTOMY AND ADENOIDECTOMY  10/2004  . TRANSTHORACIC ECHOCARDIOGRAM  04/09/4006   Normal systolic function.  EF 55-60%.  Mild MR, atria ok, trivial pericardial effusion  . TRANSVAGINAL TAPE (TVT) REMOVAL  06/15/2010   Dr Mancel Bale, for urge incontinence    OB History    Gravida  2   Para      Term      Preterm      AB      Living  2     SAB      IAB      Ectopic      Multiple      Live Births               Home Medications    Prior to Admission medications   Medication Sig Start Date End Date Taking? Authorizing Provider  meloxicam (MOBIC) 7.5 MG tablet Take 1 tablet (7.5 mg total) by mouth daily as needed for pain. 05/21/20  Yes Volney American, PA-C  aspirin 325 MG tablet Take 325 mg  by mouth daily.    [provider]  escitalopram (LEXAPRO) 20 MG tablet TAKE 1 TABLET BY MOUTH EVERY DAY 02/21/20   Nicholas Lose, MD  loperamide (IMODIUM) 2 MG capsule Take by mouth as needed for diarrhea or loose stools (as needed for diarrhea).    [provider]  promethazine (PHENERGAN) 12.5 MG tablet Take 1 tablet (12.5 mg total) by mouth every 8 (eight) hours as needed for nausea or vomiting. 01/28/20   Shary Key, DO  tamoxifen (NOLVADEX) 20 MG tablet Take 1 tablet (20 mg total) by mouth daily. 03/25/20   Nicholas Lose, MD  traZODone ([place])  50 MG tablet TAKE 1/2 TO 1 TABLET AT BEDTIME AS NEEDED FOR SLEEP 07/10/18 06/15/19  Guadalupe Dawn, MD    Family History Family History  Problem Relation Age of Onset  . Heart disease Mother   . Stomach cancer Mother   . Cirrhosis Sister   . Hypertension Sister   . Heart disease Maternal Grandmother   . Heart disease Maternal Grandfather   . Colon cancer Maternal Aunt   . Other Neg Hx     Social History Social History   Tobacco Use  . Smoking status: Former Smoker    Packs/day: 0.75    Years: 40.00    Pack years: 30.00    Types: Cigarettes    Quit date: 11/2016    Years since quitting: 3.5  . Smokeless tobacco: Never Used  Vaping Use  . Vaping Use: Never used  Substance Use Topics  . Alcohol use: No    Alcohol/week: 0.0 standard drinks  . Drug use: No     Allergies   Seroquel [quetiapine fumarate], Amoxicillin, Ciprofloxacin, Haloperidol lactate, Ketorolac tromethamine, Latuda [lurasidone hcl], Thorazine [chlorpromazine], and Wellbutrin [bupropion]   Review of Systems Review of Systems PER HPI    Physical Exam Triage Vital Signs ED Triage Vitals  Enc Vitals Group     BP 05/21/20 0903 (!) 140/98     Pulse Rate 05/21/20 0903 95     Resp 05/21/20 0903 18     Temp 05/21/20 0903 97.8 F (36.6 C)     Temp src --      SpO2 05/21/20 0903 94 %     Weight --      Height --      Head  Circumference --      Peak Flow --      Pain Score 05/21/20 1102 5     Pain Loc --      Pain Edu? --      Excl. in [place]? --    No data found.  Updated Vital Signs BP (!) 140/98   Pulse 95   Temp 97.8 F (36.6 C)   Resp 18   LMP 01/09/2012   SpO2 94%   Visual Acuity Right Eye Distance:   Left Eye Distance:   Bilateral Distance:    Right Eye Near:   Left Eye Near:    Bilateral Near:     Physical Exam Vitals and nursing note reviewed.  Constitutional:      Appearance: Normal appearance. She is not ill-appearing.  HENT:     Head: Atraumatic.  Eyes:     Extraocular Movements: Extraocular movements intact.     Conjunctiva/sclera: Conjunctivae normal.  Cardiovascular:     Rate and Rhythm: Normal rate and regular rhythm.     Pulses: Normal pulses.     Heart sounds: Normal heart sounds.     Comments: Varicose veins diffusely b/l lower legs, worst in area right medial lower leg where she notes focal edema at times. Resolves with leg elevation Pulmonary:     Effort: Pulmonary effort is normal.     Breath sounds: Normal breath sounds.  Musculoskeletal:     Cervical back: Normal range of motion and neck supple.     Comments: In wheelchair, unclear what her baseline is  Good ROM in all 4 extremities, no joint edema, erythema, point tenderness to palpation.   Skin:    General: Skin is warm and dry.  Neurological:     Mental Status: She is alert.  Sensory: No sensory deficit.  Psychiatric:        Mood and Affect: Mood normal.        Thought Content: Thought content normal.        Judgment: Judgment normal.      UC Treatments / Results  Labs (all labs ordered are listed, but only abnormal results are displayed) Labs Reviewed - No data to display  EKG   Radiology DG Tibia/Fibula Right  Result Date: 05/21/2020 CLINICAL DATA:  Chronic right ankle pain.  No injury. EXAM: RIGHT TIBIA AND FIBULA - 2 VIEW COMPARISON:  01/09/2015. FINDINGS: Diffuse osteopenia. No  acute bony or joint abnormality identified. No evidence of fracture or dislocation. Soft tissues are unremarkable. Peripheral vascular calcification. IMPRESSION: 1. Diffuse osteopenia. No acute bony or joint abnormality identified. 2.  Peripheral vascular disease. Electronically Signed   By: Marcello Moores  Register   On: 05/21/2020 10:38    Procedures Procedures (including critical care time)  Medications Ordered in UC Medications - No data to display  Initial Impression / Assessment and Plan / UC Course  I have reviewed the triage vital signs and the nursing notes.  Pertinent labs & imaging results that were available during my care of the patient were reviewed by me and considered in my medical decision making (see chart for details).     Initially x-ray was ordered for right ankle given this area was worsening pain but patient requesting further upward imaging so changed to tibia/fibula x-ray. This showed osteopenia but no other bony abnormality. Suspect her diffuse joint pains are related to arthritis. Meloxicam prn, close PCP f/u for recheck. Suspect the heaviness in legs and swelling are from varicose veins, discussed leg elevation, exercise, compression stockings for this. Return for f/u with PCP as soon as able.   Final Clinical Impressions(s) / UC Diagnoses   Final diagnoses:  Arthralgia, unspecified joint  Varicose veins of bilateral lower extremities with other complications     Discharge Instructions     Wear compression stockings at least to the knee on both legs daily, elevate your legs at rest  Take meloxicam daily as needed for your joint pains Follow up with your Primary Care provider as soon as able to re-check your concerns.     ED Prescriptions    Medication Sig Dispense Auth. Provider   meloxicam (MOBIC) 7.5 MG tablet Take 1 tablet (7.5 mg total) by mouth daily as needed for pain. 30 tablet Volney American, [place]     PDMP not reviewed this encounter.    Volney American, [place] 05/21/20 1800

## 2020-05-21 NOTE — ED Notes (Signed)
Called [facility] [employer] at patient's request

## 2020-05-23 ENCOUNTER — Ambulatory Visit: Payer: Medicare Other | Admitting: Family Medicine

## 2020-06-03 ENCOUNTER — Telehealth: Payer: Self-pay

## 2020-06-03 NOTE — Telephone Encounter (Signed)
Thanks yes I called her about her results previously and left a message to get back to me. I can fill it for now and will let her know I'd like to see her in clinic to recheck labs and discuss.Thank you!

## 2020-06-03 NOTE — Telephone Encounter (Signed)
Patient calls nurse line requesting a refill on Synthroid, however I do not see this on her medication list. Patient reports no one called her about her results. I advised patient she did miss her FU apt, however she declines to make one at this time. Will forward to PCP.

## 2020-06-14 ENCOUNTER — Other Ambulatory Visit: Payer: Self-pay | Admitting: Family Medicine

## 2020-06-14 ENCOUNTER — Encounter: Payer: Self-pay | Admitting: Family Medicine

## 2020-06-14 MED ORDER — LEVOTHYROXINE SODIUM 125 MCG PO TABS: 125.0000 ug | ORAL_TABLET | ORAL | 3 refills | Status: DC

## 2020-06-14 NOTE — Progress Notes (Signed)
Refilling patient's Synthroid 112mcg. Patient missed last appointment, advised her to make another appointment in the clinic to recheck her TSH to ensure proper dosing of Synthroid.

## 2020-06-23 ENCOUNTER — Other Ambulatory Visit: Payer: Self-pay | Admitting: Hematology and Oncology

## 2020-07-06 ENCOUNTER — Encounter (HOSPITAL_COMMUNITY): Payer: Self-pay | Admitting: Emergency Medicine

## 2020-07-06 ENCOUNTER — Emergency Department (HOSPITAL_COMMUNITY)
Admission: EM | Admit: 2020-07-06 | Discharge: 2020-07-06 | Disposition: A | Discharge status: Home / Self Care | Payer: Medicare Other | Attending: Emergency Medicine | Admitting: Emergency Medicine

## 2020-07-06 ENCOUNTER — Other Ambulatory Visit: Payer: Self-pay

## 2020-07-06 DIAGNOSIS — R112 Nausea with vomiting, unspecified: Secondary | ICD-10-CM | POA: Insufficient documentation

## 2020-07-06 DIAGNOSIS — Z5321 Procedure and treatment not carried out due to patient leaving prior to being seen by health care provider: Secondary | ICD-10-CM | POA: Insufficient documentation

## 2020-07-06 DIAGNOSIS — R001 Bradycardia, unspecified: Secondary | ICD-10-CM | POA: Diagnosis not present

## 2020-07-06 DIAGNOSIS — M791 Myalgia, unspecified site: Secondary | ICD-10-CM | POA: Insufficient documentation

## 2020-07-06 DIAGNOSIS — R42 Dizziness and giddiness: Secondary | ICD-10-CM | POA: Diagnosis not present

## 2020-07-06 DIAGNOSIS — R0902 Hypoxemia: Secondary | ICD-10-CM | POA: Diagnosis not present

## 2020-07-06 DIAGNOSIS — I10 Essential (primary) hypertension: Secondary | ICD-10-CM | POA: Diagnosis not present

## 2020-07-06 LAB — CBC WITH DIFFERENTIAL/PLATELET
Abs Immature Granulocytes: 0.02 10*3/uL (ref 0.00–0.07)
Basophils Absolute: 0 10*3/uL (ref 0.0–0.1)
Basophils Relative: 1 %
Eosinophils Absolute: 0.1 10*3/uL (ref 0.0–0.5)
Eosinophils Relative: 3 %
HCT: 40 % (ref 36.0–46.0)
Hemoglobin: 12.6 g/dL (ref 12.0–15.0)
Immature Granulocytes: 1 %
Lymphocytes Relative: 20 %
Lymphs Abs: 0.9 10*3/uL (ref 0.7–4.0)
MCH: 33.3 pg (ref 26.0–34.0)
MCHC: 31.5 g/dL (ref 30.0–36.0)
MCV: 105.8 fL — ABNORMAL HIGH (ref 80.0–100.0)
Monocytes Absolute: 0.4 10*3/uL (ref 0.1–1.0)
Monocytes Relative: 8 %
Neutro Abs: 3 10*3/uL (ref 1.7–7.7)
Neutrophils Relative %: 67 %
Platelets: 172 10*3/uL (ref 150–400)
RBC: 3.78 MIL/uL — ABNORMAL LOW (ref 3.87–5.11)
RDW: 13.9 % (ref 11.5–15.5)
WBC: 4.4 10*3/uL (ref 4.0–10.5)
nRBC: 0 % (ref 0.0–0.2)

## 2020-07-06 LAB — COMPREHENSIVE METABOLIC PANEL
ALT: 8 U/L (ref 0–44)
AST: 15 U/L (ref 15–41)
Albumin: 3.4 g/dL — ABNORMAL LOW (ref 3.5–5.0)
Alkaline Phosphatase: 368 U/L — ABNORMAL HIGH (ref 38–126)
Anion gap: 9 (ref 5–15)
BUN: 17 mg/dL (ref 8–23)
CO2: 21 mmol/L — ABNORMAL LOW (ref 22–32)
Calcium: 8.6 mg/dL — ABNORMAL LOW (ref 8.9–10.3)
Chloride: 112 mmol/L — ABNORMAL HIGH (ref 98–111)
Creatinine, Ser: 0.89 mg/dL (ref 0.44–1.00)
GFR, Estimated: >60 mL/min (ref >60)
Glucose, Bld: 99 mg/dL (ref 70–99)
Potassium: 3.5 mmol/L (ref 3.5–5.1)
Sodium: 142 mmol/L (ref 135–145)
Total Bilirubin: 0.5 mg/dL (ref 0.3–1.2)
Total Protein: 5.8 g/dL — ABNORMAL LOW (ref 6.5–8.1)

## 2020-07-06 LAB — LIPASE, BLOOD: Lipase: 35 U/L (ref 11–51)

## 2020-07-06 LAB — TROPONIN I (HIGH SENSITIVITY): Troponin I (High Sensitivity): 59 ng/L — ABNORMAL HIGH (ref <18)

## 2020-07-06 NOTE — ED Notes (Signed)
Patient states she called a cab and wants me to push her out. Advised patient to leave. lwbs

## 2020-07-06 NOTE — ED Triage Notes (Addendum)
Pt to triage via [facility] from home.  Stayed in bed until 3pm.  Reports dizziness,  nausea, and vomiting since getting up.  Reports chronic dizziness.  Reports generalized chronic pain all over.  20g L AC.  Zofran 4mg  given PTA.

## 2020-07-06 NOTE — ED Provider Notes (Signed)
MSE was initiated and I personally evaluated the patient and placed orders (if any) at  6:27 PM on July 06, 2020.  Patient placed in Quick Look pathway, seen and evaluated   Chief Complaint: LH w/ standing  HPI:   Pt states today she has become LH when standing  No CP, SOB Vomited x3 today -- usually vomits rarely w phenergan use. Seems to have chronic nausea. No abd pain. No urinary frequency, urgency or dysuria.  Right leg pain for over 2 months.   Nearly passed out twice today before EMS came.   ROS: LH (one) No abd pain, CP, SOB, rarely vertigo. No headache.   Physical Exam:   Gen: No distress  Neuro: Awake and Alert  Skin: Warm    Focused Exam: Patient is with poor hygiene, appears disheveled, oriented x3, able to answer questions appropriately. Bilateral radial artery pulses 3+ and symmetric.  Grip strength 5/5 in bilateral upper extremities.  Moves all 4 extremities spontaneously of good dexterity. no JVD, lungs are clear to auscultation in all fields.  Heart regular rhythm and rate No lower extremity edema.   Initiation of care has begun. The patient has been counseled on the process, plan, and necessity for staying for the completion/evaluation, and the remainder of the medical screening examination   The patient appears stable so that the remainder of the MSE may be completed by another provider.   Pati Gallo [place], [place] 07/06/20 1835    Arnaldo Natal, MD 07/06/20 Dorthula Perfect

## 2020-07-11 ENCOUNTER — Inpatient Hospital Stay (HOSPITAL_COMMUNITY)
Admission: EM | Admit: 2020-07-11 | Discharge: 2020-07-16 | DRG: 641 | Disposition: A | Discharge status: Skilled Nursing Facility | Payer: Medicare Other | Attending: Internal Medicine | Admitting: Internal Medicine

## 2020-07-11 ENCOUNTER — Other Ambulatory Visit: Payer: Self-pay

## 2020-07-11 ENCOUNTER — Emergency Department (HOSPITAL_COMMUNITY): Payer: Medicare Other

## 2020-07-11 DIAGNOSIS — I160 Hypertensive urgency: Principal | ICD-10-CM

## 2020-07-11 DIAGNOSIS — F0281 Dementia in other diseases classified elsewhere with behavioral disturbance: Secondary | ICD-10-CM | POA: Diagnosis present

## 2020-07-11 DIAGNOSIS — R7989 Other specified abnormal findings of blood chemistry: Secondary | ICD-10-CM

## 2020-07-11 DIAGNOSIS — J309 Allergic rhinitis, unspecified: Secondary | ICD-10-CM | POA: Diagnosis present

## 2020-07-11 DIAGNOSIS — C50919 Malignant neoplasm of unspecified site of unspecified female breast: Secondary | ICD-10-CM

## 2020-07-11 DIAGNOSIS — Z79899 Other long term (current) drug therapy: Secondary | ICD-10-CM

## 2020-07-11 DIAGNOSIS — F419 Anxiety disorder, unspecified: Secondary | ICD-10-CM | POA: Diagnosis present

## 2020-07-11 DIAGNOSIS — Z800 Family history of malignant neoplasm of digestive organs: Secondary | ICD-10-CM

## 2020-07-11 DIAGNOSIS — Z8249 Family history of ischemic heart disease and other diseases of the circulatory system: Secondary | ICD-10-CM

## 2020-07-11 DIAGNOSIS — R9431 Abnormal electrocardiogram [ECG] [EKG]: Secondary | ICD-10-CM

## 2020-07-11 DIAGNOSIS — E860 Dehydration: Secondary | ICD-10-CM | POA: Diagnosis present

## 2020-07-11 DIAGNOSIS — Z8711 Personal history of peptic ulcer disease: Secondary | ICD-10-CM

## 2020-07-11 DIAGNOSIS — N182 Chronic kidney disease, stage 2 (mild): Secondary | ICD-10-CM | POA: Diagnosis not present

## 2020-07-11 DIAGNOSIS — C50912 Malignant neoplasm of unspecified site of left female breast: Secondary | ICD-10-CM | POA: Diagnosis not present

## 2020-07-11 DIAGNOSIS — E785 Hyperlipidemia, unspecified: Secondary | ICD-10-CM | POA: Diagnosis present

## 2020-07-11 DIAGNOSIS — Z7982 Long term (current) use of aspirin: Secondary | ICD-10-CM

## 2020-07-11 DIAGNOSIS — Z79890 Hormone replacement therapy (postmenopausal): Secondary | ICD-10-CM

## 2020-07-11 DIAGNOSIS — R110 Nausea: Secondary | ICD-10-CM | POA: Diagnosis not present

## 2020-07-11 DIAGNOSIS — R42 Dizziness and giddiness: Secondary | ICD-10-CM

## 2020-07-11 DIAGNOSIS — E039 Hypothyroidism, unspecified: Secondary | ICD-10-CM | POA: Diagnosis not present

## 2020-07-11 DIAGNOSIS — R55 Syncope and collapse: Secondary | ICD-10-CM | POA: Diagnosis not present

## 2020-07-11 DIAGNOSIS — Z8673 Personal history of transient ischemic attack (TIA), and cerebral infarction without residual deficits: Secondary | ICD-10-CM

## 2020-07-11 DIAGNOSIS — F32A Depression, unspecified: Secondary | ICD-10-CM | POA: Diagnosis present

## 2020-07-11 DIAGNOSIS — R1110 Vomiting, unspecified: Secondary | ICD-10-CM | POA: Diagnosis not present

## 2020-07-11 DIAGNOSIS — F0390 Unspecified dementia without behavioral disturbance: Secondary | ICD-10-CM | POA: Diagnosis present

## 2020-07-11 DIAGNOSIS — C7951 Secondary malignant neoplasm of bone: Secondary | ICD-10-CM | POA: Diagnosis not present

## 2020-07-11 DIAGNOSIS — R778 Other specified abnormalities of plasma proteins: Secondary | ICD-10-CM

## 2020-07-11 DIAGNOSIS — Z87891 Personal history of nicotine dependence: Secondary | ICD-10-CM

## 2020-07-11 DIAGNOSIS — Z20822 Contact with and (suspected) exposure to covid-19: Secondary | ICD-10-CM | POA: Diagnosis not present

## 2020-07-11 DIAGNOSIS — Z881 Allergy status to other antibiotic agents status: Secondary | ICD-10-CM

## 2020-07-11 DIAGNOSIS — R000 Tachycardia, unspecified: Secondary | ICD-10-CM | POA: Diagnosis not present

## 2020-07-11 DIAGNOSIS — J449 Chronic obstructive pulmonary disease, unspecified: Secondary | ICD-10-CM | POA: Diagnosis present

## 2020-07-11 DIAGNOSIS — Z9012 Acquired absence of left breast and nipple: Secondary | ICD-10-CM

## 2020-07-11 DIAGNOSIS — K219 Gastro-esophageal reflux disease without esophagitis: Secondary | ICD-10-CM | POA: Diagnosis present

## 2020-07-11 DIAGNOSIS — Z9841 Cataract extraction status, right eye: Secondary | ICD-10-CM

## 2020-07-11 DIAGNOSIS — Z853 Personal history of malignant neoplasm of breast: Secondary | ICD-10-CM | POA: Diagnosis not present

## 2020-07-11 DIAGNOSIS — I129 Hypertensive chronic kidney disease with stage 1 through stage 4 chronic kidney disease, or unspecified chronic kidney disease: Secondary | ICD-10-CM | POA: Diagnosis present

## 2020-07-11 DIAGNOSIS — F429 Obsessive-compulsive disorder, unspecified: Secondary | ICD-10-CM | POA: Diagnosis present

## 2020-07-11 DIAGNOSIS — I10 Essential (primary) hypertension: Secondary | ICD-10-CM | POA: Diagnosis not present

## 2020-07-11 DIAGNOSIS — Z880 Allergy status to penicillin: Secondary | ICD-10-CM

## 2020-07-11 DIAGNOSIS — Z923 Personal history of irradiation: Secondary | ICD-10-CM

## 2020-07-11 LAB — CBC WITH DIFFERENTIAL/PLATELET
Abs Immature Granulocytes: 0.01 10*3/uL (ref 0.00–0.07)
Basophils Absolute: 0 10*3/uL (ref 0.0–0.1)
Basophils Relative: 1 %
Eosinophils Absolute: 0.1 10*3/uL (ref 0.0–0.5)
Eosinophils Relative: 2 %
HCT: 38.8 % (ref 36.0–46.0)
Hemoglobin: 12.3 g/dL (ref 12.0–15.0)
Immature Granulocytes: 0 %
Lymphocytes Relative: 21 %
Lymphs Abs: 0.9 10*3/uL (ref 0.7–4.0)
MCH: 32.7 pg (ref 26.0–34.0)
MCHC: 31.7 g/dL (ref 30.0–36.0)
MCV: 103.2 fL — ABNORMAL HIGH (ref 80.0–100.0)
Monocytes Absolute: 0.5 10*3/uL (ref 0.1–1.0)
Monocytes Relative: 11 %
Neutro Abs: 2.8 10*3/uL (ref 1.7–7.7)
Neutrophils Relative %: 65 %
Platelets: 189 10*3/uL (ref 150–400)
RBC: 3.76 MIL/uL — ABNORMAL LOW (ref 3.87–5.11)
RDW: 13.7 % (ref 11.5–15.5)
WBC: 4.2 10*3/uL (ref 4.0–10.5)
nRBC: 0 % (ref 0.0–0.2)

## 2020-07-11 LAB — COMPREHENSIVE METABOLIC PANEL
ALT: 8 U/L (ref 0–44)
AST: 12 U/L — ABNORMAL LOW (ref 15–41)
Albumin: 3.3 g/dL — ABNORMAL LOW (ref 3.5–5.0)
Alkaline Phosphatase: 392 U/L — ABNORMAL HIGH (ref 38–126)
Anion gap: 8 (ref 5–15)
BUN: 21 mg/dL (ref 8–23)
CO2: 25 mmol/L (ref 22–32)
Calcium: 8.3 mg/dL — ABNORMAL LOW (ref 8.9–10.3)
Chloride: 110 mmol/L (ref 98–111)
Creatinine, Ser: 0.74 mg/dL (ref 0.44–1.00)
GFR, Estimated: >60 mL/min (ref >60)
Glucose, Bld: 89 mg/dL (ref 70–99)
Potassium: 3.6 mmol/L (ref 3.5–5.1)
Sodium: 143 mmol/L (ref 135–145)
Total Bilirubin: 0.6 mg/dL (ref 0.3–1.2)
Total Protein: 5.9 g/dL — ABNORMAL LOW (ref 6.5–8.1)

## 2020-07-11 LAB — RESP PANEL BY RT-PCR (FLU A&B, COVID) ARPGX2
Influenza A by PCR: NEGATIVE
Influenza B by PCR: NEGATIVE
SARS Coronavirus 2 by RT PCR: NEGATIVE

## 2020-07-11 LAB — I-STAT CHEM 8, ED
BUN: 21 mg/dL (ref 8–23)
Calcium, Ion: 1.1 mmol/L — ABNORMAL LOW (ref 1.15–1.40)
Chloride: 111 mmol/L (ref 98–111)
Creatinine, Ser: 0.8 mg/dL (ref 0.44–1.00)
Glucose, Bld: 85 mg/dL (ref 70–99)
HCT: 37 % (ref 36.0–46.0)
Hemoglobin: 12.6 g/dL (ref 12.0–15.0)
Potassium: 3.6 mmol/L (ref 3.5–5.1)
Sodium: 144 mmol/L (ref 135–145)
TCO2: 25 mmol/L (ref 22–32)

## 2020-07-11 LAB — LIPASE, BLOOD: Lipase: 37 U/L (ref 11–51)

## 2020-07-11 LAB — TROPONIN I (HIGH SENSITIVITY)
Troponin I (High Sensitivity): 113 ng/L (ref <18)
Troponin I (High Sensitivity): 112 ng/L (ref <18)

## 2020-07-11 MED ORDER — IOHEXOL 350 MG/ML SOLN
75.0000 mL | Freq: Once | INTRAVENOUS | Status: AC | PRN
  Administered 2020-07-11: 75 mL via INTRAVENOUS

## 2020-07-11 MED ORDER — SODIUM CHLORIDE 0.9 % IV BOLUS
1000.0000 mL | Freq: Once | INTRAVENOUS | Status: AC
  Administered 2020-07-11: 1000 mL via INTRAVENOUS

## 2020-07-11 MED ORDER — ENOXAPARIN SODIUM 40 MG/0.4ML ~~LOC~~ SOLN
40.0000 mg | SUBCUTANEOUS | Status: DC
  Administered 2020-07-12 – 2020-07-16 (×5): 40 mg via SUBCUTANEOUS
  Filled 2020-07-11 (×5): qty 0.4

## 2020-07-11 MED ORDER — MECLIZINE HCL 25 MG PO TABS
25.0000 mg | ORAL_TABLET | Freq: Once | ORAL | Status: AC
  Administered 2020-07-11: 25 mg via ORAL
  Filled 2020-07-11: qty 1

## 2020-07-11 MED ORDER — GADOBUTROL 1 MMOL/ML IV SOLN
6.0000 mL | Freq: Once | INTRAVENOUS | Status: AC | PRN
  Administered 2020-07-11: 6 mL via INTRAVENOUS

## 2020-07-11 MED ORDER — ONDANSETRON HCL 4 MG/2ML IJ SOLN
4.0000 mg | Freq: Once | INTRAMUSCULAR | Status: AC
  Administered 2020-07-11: 4 mg via INTRAVENOUS
  Filled 2020-07-11: qty 2

## 2020-07-11 MED ORDER — LORAZEPAM 2 MG/ML IJ SOLN
1.0000 mg | Freq: Once | INTRAMUSCULAR | Status: AC
  Administered 2020-07-11: 1 mg via INTRAVENOUS
  Filled 2020-07-11: qty 1

## 2020-07-11 MED ORDER — HYDRALAZINE HCL 20 MG/ML IJ SOLN
5.0000 mg | Freq: Once | INTRAMUSCULAR | Status: AC
  Administered 2020-07-11: 5 mg via INTRAVENOUS
  Filled 2020-07-11: qty 1

## 2020-07-11 NOTE — ED Triage Notes (Signed)
Per EMS, patient from home, c/o dizziness with nausea since 1500. Hx of same. Hypertensive with EMS 180/110.

## 2020-07-11 NOTE — ED Provider Notes (Signed)
[facility] DEPT Provider Note   CSN: [id number] Arrival date & time: 07/11/20  1653     History Chief Complaint  Patient presents with  . Weakness    [name] is a 75 y.o. female history of COPD, hypertension, breast cancer, here presenting with multiple complaints.  Patient states that she has been feeling dizzy for several weeks.  She states that the room is spinning.  She also has chronic nausea that has been getting worse recently.  He states that she also felt a lump in her neck and was concerned that she may have metastatic breast cancer.  She has a history of breast cancer required lumpectomy previously.  She last seen her cancer doctor several years ago and was in remission at that time.  Patient was noted to be hypertensive in the ED.  Patient denies any chest pain or abdominal pain.  Patient went to count several days ago and was seen in triage and had mildly elevated troponin of 59 and left without being fully evaluated.   The history is provided by the patient.       Past Medical History:  Diagnosis Date  . Anxiety   . Arthritis   . Cancer [facility])    breast cancer  . COPD (chronic obstructive pulmonary disease) (HCC)    Due to long history of tobacco abuse, still smoking  . Depression   . Diverticulosis of colon   . GERD (gastroesophageal reflux disease)   . Heart murmur    not sure never been evaluated  . History of uterine prolapse 10/2004   Dr Cletis Media  . Hypertension    not on any medications  . Hypothyroidism   . OCD (obsessive compulsive disorder)   . Osteoporosis   . PUD (peptic ulcer disease) 09/2006   H pylori  . Thyroid disease    Ho R thyroid noduel plus mild hyperthyroidism. Treated with radioiodine therapty on 04/2006. Dr Estrella Myrtle.    Patient Active Problem List   Diagnosis Date Noted  . Skin lesion of right leg 04/24/2020  . Inclusion cyst 04/24/2020  . Skin lesion of right arm 08/16/2019  . Sleep disturbance  06/13/2018  . Health education 06/13/2018  . Chronic left hip pain 12/29/2017  . Late onset Alzheimer's disease with behavioral disturbance ([place]) 12/29/2017  . Cancer of left breast, stage 1, estrogen receptor positive ([place]) 05/13/2017  . Malignant neoplasm of overlapping sites of left breast in female, estrogen receptor positive ([place]) 03/08/2017  . Lesion of nose 03/02/2017  . Callus of foot 11/20/2016  . Loss of appetite 12/29/2015  . Fatigue 12/29/2015  . Bipolar affective disorder, currently depressed, moderate ([place]) 11/26/2015  . Severe episode of recurrent major depressive disorder, without psychotic features ([place])   . Foreign body in foot, right 01/09/2015  . Blister of right ankle without infection 01/09/2015  . Sinusitis 11/26/2014  . Rotator cuff syndrome of right shoulder 11/14/2014  . Varicose veins of both lower extremities with pain 10/29/2014  . Neck pain 09/26/2014  . Metatarsal fracture 08/16/2014  . Pain in joint, shoulder region 08/16/2014  . Facial skin lesion 08/01/2014  . Leg edema, left 07/25/2014  . Edema of left lower extremity 07/22/2014  . Essential hypertension   . Thyroid activity decreased   . HLD (hyperlipidemia)   . Healthcare maintenance 05/31/2014  . Emotional neurotic disorder 05/07/2014  . Seborrheic keratosis 12/05/2013  . 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 Mass of left side of  neck 10/17/2012  . At high risk for falls 06/28/2012  . Allergic rhinitis 06/16/2012  . Mass of left breast 10/27/2011  . Hearing loss 01/07/2011  . Foot pain 11/25/2010  . Thyroid nodule 10/16/2010  . Nausea 09/22/2010  . URINARY INCONTINENCE, STRESS, FEMALE 04/03/2010  . HYPERLIPIDEMIA 03/25/2010  . PULMONARY NODULE 12/23/2009  . COPD (chronic obstructive pulmonary disease) ([place]) 12/03/2009  . Tobacco abuse counseling 11/05/2009  . CVA 11/05/2009  . BIPOLAR DISORDER UNSPECIFIED 11/28/2008  . Hypothyroidism 05/06/2008  . OCD (obsessive compulsive  disorder) 10/13/2006  . GERD 10/13/2006  . OSTEOPOROSIS 10/13/2006    Past Surgical History:  Procedure Laterality Date  . ABDOMINAL HYSTERECTOMY    . BIOPSY THYROID  08/2008   Atypia and Hurtle cells, partial thyroidectomy   . BLADDER SUSPENSION     mesh sling  . Carotid ultrasound  09/10/09   No significant extracranial carotid artery stenosis, vertebrals are patent with antegrade flow.   [name] CATARACT EXTRACTION Right   . COLONOSCOPY  06/03/2005   Hyperplastic polyps, sigmoid diverticulosis, internal hemorroids  . EYE SURGERY     laser surgery does not know which eye  . FOREIGN BODY REMOVAL Right 02/21/2015   Procedure: RIGHT FOOT FOREIGN BODY REMOVAL ;  Surgeon: Marchia Bond, MD;  Location: [facility];  Service: Orthopedics;  Laterality: Right;  . MASTECTOMY COMPLETE / SIMPLE W/ SENTINEL NODE BIOPSY Left 05/13/2017  . MASTECTOMY W/ SENTINEL NODE BIOPSY Left 05/13/2017   Procedure: LEFT TOTAL MASTECTOMY WITH AXILLARY SENTINEL LYMPH NODE BIOPSY;  Surgeon: Excell Seltzer, MD;  Location: [facility];  Service: General;  Laterality: Left;  . MRI Head  09/20/09   No acute intracranial abn.  Signal abnormality suggestive of chronic small vessel ischemia, maximal in the right subinsular white and deep gray mater.   [name] PFT  11/2006   Obstructive pattern.  Poor response to bronchodilators.   . THYROIDECTOMY, PARTIAL  08/2008   Dr Ronnald Collum  . TONSILLECTOMY AND ADENOIDECTOMY  10/2004  . TRANSTHORACIC ECHOCARDIOGRAM  07/07/3152   Normal systolic function.  EF 55-60%.  Mild MR, atria ok, trivial pericardial effusion  . TRANSVAGINAL TAPE (TVT) REMOVAL  06/15/2010   Dr Mancel Bale, for urge incontinence     OB History    Gravida  2   Para      Term      Preterm      AB      Living  2     SAB      IAB      Ectopic      Multiple      Live Births              Family History  Problem Relation Age of Onset  . Heart disease Mother   . Stomach cancer Mother   .  Cirrhosis Sister   . Hypertension Sister   . Heart disease Maternal Grandmother   . Heart disease Maternal Grandfather   . Colon cancer Maternal Aunt   . Other Neg Hx     Social History   Tobacco Use  . Smoking status: Former Smoker    Packs/day: 0.75    Years: 40.00    Pack years: 30.00    Types: Cigarettes    Quit date: 11/2016    Years since quitting: 3.6  . Smokeless tobacco: Never Used  Vaping Use  . Vaping Use: Never used  Substance Use Topics  . Alcohol use: No    Alcohol/week:  0.0 standard drinks  . Drug use: No    Home Medications Prior to Admission medications   Medication Sig Start Date End Date Taking? Authorizing Provider  aspirin 325 MG tablet Take 325 mg by mouth daily.    [provider]  escitalopram (LEXAPRO) 20 MG tablet TAKE 1 TABLET BY MOUTH EVERY DAY 06/23/20   Nicholas Lose, MD  levothyroxine (SYNTHROID) 125 MCG tablet Take 1 tablet (125 mcg total) by mouth every morning. 30 minutes before food 06/14/20   Shary Key, DO  loperamide (IMODIUM) 2 MG capsule Take by mouth as needed for diarrhea or loose stools (as needed for diarrhea).    [provider]  meloxicam (MOBIC) 7.5 MG tablet Take 1 tablet (7.5 mg total) by mouth daily as needed for pain. 05/21/20   Volney American, PA-C  promethazine (PHENERGAN) 12.5 MG tablet Take 1 tablet (12.5 mg total) by mouth every 8 (eight) hours as needed for nausea or vomiting. 01/28/20   Shary Key, DO  tamoxifen (NOLVADEX) 20 MG tablet Take 1 tablet (20 mg total) by mouth daily. 03/25/20   Nicholas Lose, MD  traZODone (DESYREL) 50 MG tablet TAKE 1/2 TO 1 TABLET AT BEDTIME AS NEEDED FOR SLEEP 07/10/18 06/15/19  Guadalupe Dawn, MD    Allergies    Seroquel [quetiapine fumarate], Amoxicillin, Ciprofloxacin, Haloperidol lactate, Ketorolac tromethamine, Latuda [lurasidone hcl], Thorazine [chlorpromazine], and Wellbutrin [bupropion]  Review of Systems   Review of Systems   Gastrointestinal: Positive for vomiting.  Neurological: Positive for dizziness and light-headedness.  All other systems reviewed and are negative.   Physical Exam Updated Vital Signs BP (!) 178/103   Pulse 84   Temp 98.4 F (36.9 C) (Oral)   Resp 15   Ht 4\' 9"  (1.448 m)   Wt 51.3 kg   LMP 01/09/2012   SpO2 100%   BMI 24.47 kg/m   Physical Exam Vitals and nursing note reviewed.  Constitutional:      Comments: Uncomfortable, chronically ill   HENT:     Head: Normocephalic.     Nose: Nose normal.     Mouth/Throat:     Mouth: Mucous membranes are moist.  Eyes:     Extraocular Movements: Extraocular movements intact.     Pupils: Pupils are equal, round, and reactive to light.  Neck:     Comments: ? Posterior cervical swollen lymph node  Cardiovascular:     Rate and Rhythm: Normal rate and regular rhythm.     Pulses: Normal pulses.     Heart sounds: Normal heart sounds.  Pulmonary:     Effort: Pulmonary effort is normal.     Breath sounds: Normal breath sounds.  Abdominal:     General: Abdomen is flat.     Palpations: Abdomen is soft.  Musculoskeletal:        General: Normal range of motion.     Cervical back: Normal range of motion and neck supple.  Skin:    General: Skin is warm.  Neurological:     General: No focal deficit present.     Mental Status: She is oriented to person, place, and time.     Comments: CN 2- 12 intact, nl strength throughout   Psychiatric:        Mood and Affect: Mood normal.        Behavior: Behavior normal.     ED Results / Procedures / Treatments   Labs (all labs ordered are listed, but only abnormal results are displayed)  Labs Reviewed  CBC WITH DIFFERENTIAL/PLATELET - Abnormal; Notable for the following components:      Result Value   RBC 3.76 (*)    MCV 103.2 (*)    All other components within normal limits  COMPREHENSIVE METABOLIC PANEL - Abnormal; Notable for the following components:   Calcium 8.3 (*)    Total Protein  5.9 (*)    Albumin 3.3 (*)    AST 12 (*)    Alkaline Phosphatase 392 (*)    All other components within normal limits  I-STAT CHEM 8, ED - Abnormal; Notable for the following components:   Calcium, Ion 1.10 (*)    All other components within normal limits  TROPONIN I (HIGH SENSITIVITY) - Abnormal; Notable for the following components:   Troponin I (High Sensitivity) 113 (*)    All other components within normal limits  TROPONIN I (HIGH SENSITIVITY) - Abnormal; Notable for the following components:   Troponin I (High Sensitivity) 112 (*)    All other components within normal limits  RESP PANEL BY RT-PCR (FLU A&B, COVID) ARPGX2  LIPASE, BLOOD    EKG EKG Interpretation  Date/Time:  Friday July 11 2020 17:41:33 EDT Ventricular Rate:  74 PR Interval:  176 QRS Duration: 100 QT Interval:  455 QTC Calculation: 505 R Axis:   18 Text Interpretation: Sinus rhythm Prolonged QT interval No significant change since last tracing Confirmed by Wandra Arthurs ([id number]) on 07/11/2020 5:53:43 PM   Radiology DG Chest Port 1 View  Result Date: 07/11/2020 CLINICAL DATA:  Vomiting.  Near syncope. EXAM: PORTABLE CHEST 1 VIEW COMPARISON:  06/15/2019. FINDINGS: Normal heart size. Unchanged mediastinal contours with aortic atherosclerosis. Chronic hyperinflation and interstitial coarsening. No pneumothorax, pneumomediastinum, or pleural effusion. No focal airspace disease. No pulmonary edema. Surgical clips in the left thoracic inlet. Left mastectomy. IMPRESSION: 1. No acute abnormality. 2. Chronic hyperinflation and bronchial thickening, imaging findings consistent with COPD. Aortic Atherosclerosis (ICD10-I70.0). Electronically Signed   By: Keith Rake M.D.   On: 07/11/2020 17:46    Procedures Procedures   CRITICAL CARE Performed by: Wandra Arthurs   Total critical care time: 30 minutes  Critical care time was exclusive of separately billable procedures and treating other patients.  Critical care  was necessary to treat or prevent imminent or life-threatening deterioration.  Critical care was time spent personally by me on the following activities: development of treatment plan with patient and/or surrogate as well as nursing, discussions with consultants, evaluation of patient's response to treatment, examination of patient, obtaining history from patient or surrogate, ordering and performing treatments and interventions, ordering and review of laboratory studies, ordering and review of radiographic studies, pulse oximetry and re-evaluation of patient's condition.   Medications Ordered in ED Medications  hydrALAZINE (APRESOLINE) injection 5 mg (has no administration in time range)  sodium chloride 0.9 % bolus 1,000 mL (0 mLs Intravenous Stopped 07/11/20 2024)  meclizine (ANTIVERT) tablet 25 mg (25 mg Oral Given 07/11/20 1806)  ondansetron (ZOFRAN) injection 4 mg (4 mg Intravenous Given 07/11/20 1801)  iohexol (OMNIPAQUE) 350 MG/ML injection 75 mL (75 mLs Intravenous Contrast Given 07/11/20 1855)  LORazepam (ATIVAN) injection 1 mg (1 mg Intravenous Given 07/11/20 2030)  gadobutrol (GADAVIST) 1 MMOL/ML injection 6 mL (6 mLs Intravenous Contrast Given 07/11/20 2112)    ED Course  I have reviewed the triage vital signs and the nursing notes.  Pertinent labs & imaging results that were available during my care of the patient were reviewed by me  and considered in my medical decision making (see chart for details).    MDM Rules/Calculators/A&P                         Preslee KARMIN KASPRZAK is a 75 y.o. female here with dizziness, vomiting.  Consider peripheral vertigo versus stroke versus brain mets.  Patient is hypertensive to 180s.  No chest pain or abdominal pain to suggest dissection.  Plan to get CTA head and neck and MRI brain with and without contrast.  Also will get CBC and CMP and repeat troponin.  10:23 PM Patient has elevated troponin around 110 that is stable.  Her troponin is more elevated  compared to several days ago when she was in the ED.  Continues to deny any chest pain.  Patient's CTA showed multifocal stenosis but no obvious LVO.  Patient does have some sclerotic change in her neck concerning for possible metastatic disease from breast cancer.  Her MRI did not show any brain mets or stroke.  Patient is still very dizzy after getting meclizine.  I wonder if the elevated troponin is from demand ischemia from her hypertension.  At this point she may need an echo and I ordered hydralazine.  Will admit for hypertensive urgency.   Final Clinical Impression(s) / ED Diagnoses Final diagnoses:  None    Rx / DC Orders ED Discharge Orders    None       Drenda Freeze, MD 07/11/20 2226

## 2020-07-11 NOTE — H&P (Addendum)
History and Physical    [name] WNU:[id number] DOB: June 26, 1945 DOA: 07/11/2020  PCP: Dana Key, DO  Patient coming from: Home  I have personally briefly reviewed patient's old medical records in [place]  Chief Complaint: Dizziness, nausea and vomiting  HPI: [name] is a 75 y.o. female with medical history significant for breast cancer diagnosed in 2018 s/p left mastectomy, radiation and currently on tamoxifen, hypertension, CVA, hypothyroidism, COPD, asthma, CKD stage II, and GERD who presents with concerns of dizziness, nausea and vomiting.  Patient reports that recently for the past week she has felt dizzy both when trying to lay down and trying to get up.  Worse when she moves her head.  Got really dizzy today walking to the kitchen and feeling as if she will faint.  Denies any recent loss of consciousness.  Also has associated nausea and vomited today.  She is also noted a right sided neck lump that she is worried is cancerous since she has a history of breast cancer.  She last saw oncology in December 2021 and was advised to obtain right-sided breast mammogram but has been unable to go due to transportation issue. Denies any issues with right breast. No chest pain other than pain from her old scar. Chronically has some shortness of breath.    Patient reports that she has been feeling unwell for the past 3 years and has also been dealing with depression since 2003.  States that "some days she feels like she will die."  She usually just stays at home and has very few people that are able to get her groceries.  She mostly only eats strawberry jam with peanut butter sandwich daily.  She denies any extremity weakness.  Reports compliant with all medication.  Denies any tobacco, alcohol or illicit drug use.  ED Course: She was afebrile, hypertensive up to 034 systolic.  CBC and CMP was unremarkable except for elevated alk phos of 392.  Troponin elevated at 113 and  112.  Patient was given meclizine without much improvement of her dizziness.  CTA head and neck as well as MRI brain was obtained.  I am unable to see the final results but per ED physician Dr. Ky Barban her MRI brain was negative and CTA neck shows sclerotic lesion potentially from metastatic disease.  Review of Systems:  Constitutional: No Weight Change, No Fever ENT/Mouth: No sore throat, No Rhinorrhea Eyes: No Eye Pain, No Vision Changes Cardiovascular: No Chest Pain, no SOB, No PND, No Dyspnea on Exertion, No Orthopnea, No Claudication, No Edema, No Palpitations Respiratory: No Cough, No Sputum, No Wheezing, no Dyspnea  Gastrointestinal: No Nausea, No Vomiting, No Diarrhea, No Constipation, No Pain Genitourinary: no Urinary Incontinence, No Urgency, No Flank Pain Musculoskeletal: No Arthralgias, No Myalgias Skin: No Skin Lesions, No Pruritus, Neuro: no Weakness, No Numbness,  No Loss of Consciousness, No Syncope Psych: No Anxiety/Panic, No Depression, no decrease appetite Heme/Lymph: No Bruising, No Bleeding  Past Medical History:  Diagnosis Date  . Anxiety   . Arthritis   . Cancer [facility])    breast cancer  . COPD (chronic obstructive pulmonary disease) (HCC)    Due to long history of tobacco abuse, still smoking  . Depression   . Diverticulosis of colon   . GERD (gastroesophageal reflux disease)   . Heart murmur    not sure never been evaluated  . History of uterine prolapse 10/2004   Dr Cletis Media  . Hypertension  not on any medications  . Hypothyroidism   . OCD (obsessive compulsive disorder)   . Osteoporosis   . PUD (peptic ulcer disease) 09/2006   H pylori  . Thyroid disease    Ho R thyroid noduel plus mild hyperthyroidism. Treated with radioiodine therapty on 04/2006. Dr Estrella Myrtle.    Past Surgical History:  Procedure Laterality Date  . ABDOMINAL HYSTERECTOMY    . BIOPSY THYROID  08/2008   Atypia and Hurtle cells, partial thyroidectomy   . BLADDER SUSPENSION     mesh  sling  . Carotid ultrasound  09/10/09   No significant extracranial carotid artery stenosis, vertebrals are patent with antegrade flow.   [name] CATARACT EXTRACTION Right   . COLONOSCOPY  06/03/2005   Hyperplastic polyps, sigmoid diverticulosis, internal hemorroids  . EYE SURGERY     laser surgery does not know which eye  . FOREIGN BODY REMOVAL Right 02/21/2015   Procedure: RIGHT FOOT FOREIGN BODY REMOVAL ;  Surgeon: Marchia Bond, MD;  Location: [facility];  Service: Orthopedics;  Laterality: Right;  . MASTECTOMY COMPLETE / SIMPLE W/ SENTINEL NODE BIOPSY Left 05/13/2017  . MASTECTOMY W/ SENTINEL NODE BIOPSY Left 05/13/2017   Procedure: LEFT TOTAL MASTECTOMY WITH AXILLARY SENTINEL LYMPH NODE BIOPSY;  Surgeon: Excell Seltzer, MD;  Location: [facility];  Service: General;  Laterality: Left;  . MRI Head  09/20/09   No acute intracranial abn.  Signal abnormality suggestive of chronic small vessel ischemia, maximal in the right subinsular white and deep gray mater.   [name] PFT  11/2006   Obstructive pattern.  Poor response to bronchodilators.   . THYROIDECTOMY, PARTIAL  08/2008   Dr Ronnald Collum  . TONSILLECTOMY AND ADENOIDECTOMY  10/2004  . TRANSTHORACIC ECHOCARDIOGRAM  0/07/8887   Normal systolic function.  EF 55-60%.  Mild MR, atria ok, trivial pericardial effusion  . TRANSVAGINAL TAPE (TVT) REMOVAL  06/15/2010   Dr Mancel Bale, for urge incontinence     reports that she quit smoking about 3 years ago. Her smoking use included cigarettes. She has a 30.00 pack-year smoking history. She has never used smokeless tobacco. She reports that she does not drink alcohol and does not use drugs. Social History  Allergies  Allergen Reactions  . Seroquel [Quetiapine Fumarate] Other (See Comments)    Reaction:  Drowsiness   . Amoxicillin Nausea Only and Other (See Comments)    Has patient had a PCN reaction causing immediate rash, facial/tongue/throat swelling, SOB or lightheadedness with hypotension: No Has  patient had a PCN reaction causing severe rash involving mucus membranes or skin necrosis: No Has patient had a PCN reaction that required hospitalization No Has patient had a PCN reaction occurring within the last 10 years: No If all of the above answers are "NO", then may proceed with Cephalosporin use.  . Ciprofloxacin Other (See Comments)    Reaction:  Unknown   . Haloperidol Lactate Other (See Comments)    Reaction:  Arm stiffness   . Ketorolac Tromethamine Other (See Comments)    Reaction:  Unknown   . Latuda [Lurasidone Hcl] Other (See Comments)    Pt states that this medication makes her feel "weird".    . Thorazine [Chlorpromazine] Other (See Comments)    Reaction:  Unknown   . Wellbutrin [Bupropion] Other (See Comments)    Pt states that this medication makes her feel "weird".      Family History  Problem Relation Age of Onset  . Heart disease Mother   . Stomach  cancer Mother   . Cirrhosis Sister   . Hypertension Sister   . Heart disease Maternal Grandmother   . Heart disease Maternal Grandfather   . Colon cancer Maternal Aunt   . Other Neg Hx      Prior to Admission medications   Medication Sig Start Date End Date Taking? Authorizing Provider  aspirin 325 MG tablet Take 325 mg by mouth daily.   Yes [provider]  escitalopram (LEXAPRO) 20 MG tablet TAKE 1 TABLET BY MOUTH EVERY DAY 06/23/20  Yes Nicholas Lose, MD  levothyroxine (SYNTHROID) 125 MCG tablet Take 1 tablet (125 mcg total) by mouth every morning. 30 minutes before food 06/14/20  Yes Paige, Victoria J, DO  loperamide (IMODIUM) 2 MG capsule Take by mouth as needed for diarrhea or loose stools (as needed for diarrhea).   Yes [provider]  promethazine (PHENERGAN) 12.5 MG tablet Take 1 tablet (12.5 mg total) by mouth every 8 (eight) hours as needed for nausea or vomiting. 01/28/20  Yes Paige, Weldon Picking, DO  tamoxifen (NOLVADEX) 20 MG tablet Take 1 tablet (20 mg total) by mouth daily.  03/25/20  Yes Nicholas Lose, MD  meloxicam (MOBIC) 7.5 MG tablet Take 1 tablet (7.5 mg total) by mouth daily as needed for pain. Patient not taking: Reported on 07/11/2020 05/21/20   Volney American, PA-C  traZODone (DESYREL) 50 MG tablet TAKE 1/2 TO 1 TABLET AT BEDTIME AS NEEDED FOR SLEEP 07/10/18 06/15/19  Guadalupe Dawn, MD    Physical Exam: Vitals:   07/11/20 2030 07/11/20 2140 07/11/20 2200 07/11/20 2300  BP: (!) 169/89 (!) 170/102 (!) 178/103 (!) 169/98  Pulse: 67 74 84 81  Resp: _0 Temp:      TempSrc:      SpO2: 100% 100% 100% 100%  Weight:      Height:        Constitutional: NAD, calm, chronically ill appearing disheveled female sitting upright in bed Vitals:   07/11/20 2030 07/11/20 2140 07/11/20 2200 07/11/20 2300  BP: (!) 169/89 (!) 170/102 (!) 178/103 (!) 169/98  Pulse: 67 74 84 81  Resp: _1 Temp:      TempSrc:      SpO2: 100% 100% 100% 100%  Weight:      Height:       Eyes: PERRL, lids and conjunctivae normal ENMT: Mucous membranes are moist.  Neck: normal, supple Respiratory: clear to auscultation bilaterally, no wheezing, no crackles. Normal respiratory effort. No accessory muscle use.  Cardiovascular: Regular rate and rhythm, no murmurs / rubs / gallops. No extremity edema.   Abdomen: no tenderness, no masses palpated.  Bowel sounds positive.  Musculoskeletal: no clubbing / cyanosis. No joint deformity upper and lower extremities. Good ROM, no contractures. Normal muscle tone.  Skin: no rashes, lesions, ulcers. No induration Neurologic: CN 2-12 grossly intact. Sensation intact, Strength 5/5 in all 4.  Dix-Hallpike maneuver not attempted due to potential sclerotic lesion of cervical spine. Psychiatric: Normal judgment and insight. Alert and oriented x 3. Normal mood.     Labs on Admission: I have personally reviewed following labs and imaging studies  CBC: Recent Labs  Lab 07/06/20 1813 07/11/20 1759 07/11/20 1813  WBC 4.4 4.2   --   NEUTROABS 3.0 2.8  --   HGB 12.6 12.3 12.6  HCT 40.0 38.8 37.0  MCV 105.8* 103.2*  --   PLT 172 189  --    Basic Metabolic Panel: Recent  Labs  Lab 07/06/20 1813 07/11/20 1759 07/11/20 1813  NA 142 143 144  K 3.5 3.6 3.6  CL 112* 110 111  CO2 21* 25  --   GLUCOSE 99 89 85  BUN _0 CREATININE 0.89 0.74 0.80  CALCIUM 8.6* 8.3*  --    GFR: Estimated Creatinine Clearance: 41.3 mL/min (by C-G formula based on SCr of 0.8 mg/dL). Liver Function Tests: Recent Labs  Lab 07/06/20 1813 07/11/20 1759  AST 15 12*  ALT 8 8  ALKPHOS 368* 392*  BILITOT 0.5 0.6  PROT 5.8* 5.9*  ALBUMIN 3.4* 3.3*   Recent Labs  Lab 07/06/20 1813 07/11/20 1759  LIPASE 35 37   No results for input(s): AMMONIA in the last 168 hours. Coagulation Profile: No results for input(s): INR, PROTIME in the last 168 hours. Cardiac Enzymes: No results for input(s): CKTOTAL, CKMB, CKMBINDEX, TROPONINI in the last 168 hours. BNP (last 3 results) No results for input(s): PROBNP in the last 8760 hours. HbA1C: No results for input(s): HGBA1C in the last 72 hours. CBG: No results for input(s): GLUCAP in the last 168 hours. Lipid Profile: No results for input(s): CHOL, HDL, LDLCALC, TRIG, CHOLHDL, LDLDIRECT in the last 72 hours. Thyroid Function Tests: No results for input(s): TSH, T4TOTAL, FREET4, T3FREE, THYROIDAB in the last 72 hours. Anemia Panel: No results for input(s): VITAMINB12, FOLATE, FERRITIN, TIBC, IRON, RETICCTPCT in the last 72 hours. Urine analysis:    Component Value Date/Time   COLORURINE YELLOW 05/14/2018 0632   APPEARANCEUR HAZY (A) 05/14/2018 0632   LABSPEC 1.010 05/14/2018 0632   PHURINE 7.0 05/14/2018 0632   GLUCOSEU NEGATIVE 05/14/2018 0632   HGBUR TRACE (A) 05/14/2018 0632   HGBUR negative 04/03/2010 1401   BILIRUBINUR NEGATIVE 05/14/2018 0632   BILIRUBINUR NEG 11/13/2014 1455   KETONESUR NEGATIVE 05/14/2018 0632   PROTEINUR NEGATIVE 05/14/2018 0632   UROBILINOGEN  0.2 11/16/2014 1204   NITRITE NEGATIVE 05/14/2018 0632   LEUKOCYTESUR NEGATIVE 05/14/2018 0712    Radiological Exams on Admission: DG Chest Port 1 View  Result Date: 07/11/2020 CLINICAL DATA:  Vomiting.  Near syncope. EXAM: PORTABLE CHEST 1 VIEW COMPARISON:  06/15/2019. FINDINGS: Normal heart size. Unchanged mediastinal contours with aortic atherosclerosis. Chronic hyperinflation and interstitial coarsening. No pneumothorax, pneumomediastinum, or pleural effusion. No focal airspace disease. No pulmonary edema. Surgical clips in the left thoracic inlet. Left mastectomy. IMPRESSION: 1. No acute abnormality. 2. Chronic hyperinflation and bronchial thickening, imaging findings consistent with COPD. Aortic Atherosclerosis (ICD10-I70.0). Electronically Signed   By: Keith Rake M.D.   On: 07/11/2020 17:46      Assessment/Plan  Dizziness -unclear etiology. Final result of CTA head and neck and MRI brain has not yet been published to Epic at the time of admission. Per ED physician CTA head and neck without LVO but has sclerotic lesion on neck concerning for malignancy- although unclear this would cause her dizziness symptoms - Has elevated troponin but likely demand due to uncontrolled hypertensio. Obtain echocardiogram -Keep on telemetry.  Obtain orthostatic vital signs. - Vestibular PT consult   Hypertensive urgency Not on antihypertensives Given one time dose of IV hydralazine in ED with improvement  Prolonged QT Avoid prolonged QT medication  Hx of breast cancer s/p left mastectomy 2018, radiation Patient last saw oncology in December 2021 and was recommended right breast mammogram but has been unable to obtain due to transportation issue. CTA neck showing sclerotic lesion concerning for metastatic disease-recommend oncology consult in the morning Continue tamoxifen  Depression Hold Lexapro for now due to prolonged QT  Hx of CVA Continue aspirin  Hypothyroidism Continue  Synthroid  CKD stage II Creatinine stable   DVT prophylaxis:.Lovenox Code Status: Full Family Communication: Plan discussed with patient at bedside  disposition Plan: Home with observation Consults called:  Admission status: Observation  Level of care: Telemetry  Status is: Observation  The patient remains OBS appropriate and will d/c before 2 midnights.  Dispo: The patient is from: Home              Anticipated d/c is to: Home              Patient currently is not medically stable to d/c.   Difficult to place patient No         Orene Desanctis DO [facility]   If 7PM-7AM, please contact night-coverage [email or web address]   07/11/2020, 11:56 PM

## 2020-07-12 ENCOUNTER — Observation Stay (HOSPITAL_COMMUNITY): Payer: Medicare Other

## 2020-07-12 ENCOUNTER — Encounter (HOSPITAL_COMMUNITY): Payer: Self-pay | Admitting: Family Medicine

## 2020-07-12 DIAGNOSIS — N182 Chronic kidney disease, stage 2 (mild): Secondary | ICD-10-CM

## 2020-07-12 DIAGNOSIS — R079 Chest pain, unspecified: Secondary | ICD-10-CM

## 2020-07-12 DIAGNOSIS — E860 Dehydration: Secondary | ICD-10-CM | POA: Diagnosis present

## 2020-07-12 DIAGNOSIS — F0281 Dementia in other diseases classified elsewhere with behavioral disturbance: Secondary | ICD-10-CM | POA: Diagnosis present

## 2020-07-12 DIAGNOSIS — Z800 Family history of malignant neoplasm of digestive organs: Secondary | ICD-10-CM | POA: Diagnosis not present

## 2020-07-12 DIAGNOSIS — Z20822 Contact with and (suspected) exposure to covid-19: Secondary | ICD-10-CM | POA: Diagnosis present

## 2020-07-12 DIAGNOSIS — F419 Anxiety disorder, unspecified: Secondary | ICD-10-CM | POA: Diagnosis present

## 2020-07-12 DIAGNOSIS — Z853 Personal history of malignant neoplasm of breast: Secondary | ICD-10-CM

## 2020-07-12 DIAGNOSIS — J45909 Unspecified asthma, uncomplicated: Secondary | ICD-10-CM | POA: Diagnosis present

## 2020-07-12 DIAGNOSIS — Z7401 Bed confinement status: Secondary | ICD-10-CM | POA: Diagnosis not present

## 2020-07-12 DIAGNOSIS — E039 Hypothyroidism, unspecified: Secondary | ICD-10-CM | POA: Diagnosis present

## 2020-07-12 DIAGNOSIS — E785 Hyperlipidemia, unspecified: Secondary | ICD-10-CM | POA: Diagnosis present

## 2020-07-12 DIAGNOSIS — Z8249 Family history of ischemic heart disease and other diseases of the circulatory system: Secondary | ICD-10-CM | POA: Diagnosis not present

## 2020-07-12 DIAGNOSIS — I129 Hypertensive chronic kidney disease with stage 1 through stage 4 chronic kidney disease, or unspecified chronic kidney disease: Secondary | ICD-10-CM | POA: Diagnosis present

## 2020-07-12 DIAGNOSIS — F32A Depression, unspecified: Secondary | ICD-10-CM | POA: Diagnosis present

## 2020-07-12 DIAGNOSIS — I160 Hypertensive urgency: Secondary | ICD-10-CM | POA: Diagnosis present

## 2020-07-12 DIAGNOSIS — Z881 Allergy status to other antibiotic agents status: Secondary | ICD-10-CM | POA: Diagnosis not present

## 2020-07-12 DIAGNOSIS — R9431 Abnormal electrocardiogram [ECG] [EKG]: Secondary | ICD-10-CM

## 2020-07-12 DIAGNOSIS — Z9012 Acquired absence of left breast and nipple: Secondary | ICD-10-CM | POA: Diagnosis not present

## 2020-07-12 DIAGNOSIS — R488 Other symbolic dysfunctions: Secondary | ICD-10-CM | POA: Diagnosis present

## 2020-07-12 DIAGNOSIS — M2550 Pain in unspecified joint: Secondary | ICD-10-CM | POA: Diagnosis not present

## 2020-07-12 DIAGNOSIS — K219 Gastro-esophageal reflux disease without esophagitis: Secondary | ICD-10-CM | POA: Diagnosis present

## 2020-07-12 DIAGNOSIS — R778 Other specified abnormalities of plasma proteins: Secondary | ICD-10-CM

## 2020-07-12 DIAGNOSIS — R42 Dizziness and giddiness: Secondary | ICD-10-CM | POA: Diagnosis not present

## 2020-07-12 DIAGNOSIS — R410 Disorientation, unspecified: Secondary | ICD-10-CM | POA: Diagnosis not present

## 2020-07-12 DIAGNOSIS — F321 Major depressive disorder, single episode, moderate: Secondary | ICD-10-CM | POA: Diagnosis not present

## 2020-07-12 DIAGNOSIS — I340 Nonrheumatic mitral (valve) insufficiency: Secondary | ICD-10-CM

## 2020-07-12 DIAGNOSIS — F0390 Unspecified dementia without behavioral disturbance: Secondary | ICD-10-CM | POA: Diagnosis present

## 2020-07-12 DIAGNOSIS — Z9841 Cataract extraction status, right eye: Secondary | ICD-10-CM | POA: Diagnosis not present

## 2020-07-12 DIAGNOSIS — C7951 Secondary malignant neoplasm of bone: Secondary | ICD-10-CM | POA: Diagnosis present

## 2020-07-12 DIAGNOSIS — Z8673 Personal history of transient ischemic attack (TIA), and cerebral infarction without residual deficits: Secondary | ICD-10-CM

## 2020-07-12 DIAGNOSIS — R7989 Other specified abnormal findings of blood chemistry: Secondary | ICD-10-CM | POA: Diagnosis not present

## 2020-07-12 DIAGNOSIS — Z79890 Hormone replacement therapy (postmenopausal): Secondary | ICD-10-CM | POA: Diagnosis not present

## 2020-07-12 DIAGNOSIS — R2689 Other abnormalities of gait and mobility: Secondary | ICD-10-CM | POA: Diagnosis present

## 2020-07-12 DIAGNOSIS — Z7982 Long term (current) use of aspirin: Secondary | ICD-10-CM | POA: Diagnosis not present

## 2020-07-12 DIAGNOSIS — N6320 Unspecified lump in the left breast, unspecified quadrant: Secondary | ICD-10-CM | POA: Diagnosis present

## 2020-07-12 DIAGNOSIS — J449 Chronic obstructive pulmonary disease, unspecified: Secondary | ICD-10-CM | POA: Diagnosis present

## 2020-07-12 DIAGNOSIS — M6281 Muscle weakness (generalized): Secondary | ICD-10-CM | POA: Diagnosis present

## 2020-07-12 DIAGNOSIS — R278 Other lack of coordination: Secondary | ICD-10-CM | POA: Diagnosis present

## 2020-07-12 DIAGNOSIS — Z880 Allergy status to penicillin: Secondary | ICD-10-CM | POA: Diagnosis not present

## 2020-07-12 DIAGNOSIS — I10 Essential (primary) hypertension: Secondary | ICD-10-CM | POA: Diagnosis present

## 2020-07-12 DIAGNOSIS — I6350 Cerebral infarction due to unspecified occlusion or stenosis of unspecified cerebral artery: Secondary | ICD-10-CM | POA: Diagnosis present

## 2020-07-12 DIAGNOSIS — Z923 Personal history of irradiation: Secondary | ICD-10-CM | POA: Diagnosis not present

## 2020-07-12 LAB — ECHOCARDIOGRAM COMPLETE
Area-P 1/2: 3.17 cm2
Calc EF: 51.2 %
Height: 57 in
MV M vel: 5.91 m/s
MV Peak grad: 139.7 mmHg
Radius: 0.4 cm
S' Lateral: 3.3 cm
Single Plane A2C EF: 52.8 %
Single Plane A4C EF: 48.9 %
Weight: 1809.54 oz

## 2020-07-12 LAB — BASIC METABOLIC PANEL
Anion gap: 8 (ref 5–15)
BUN: 17 mg/dL (ref 8–23)
CO2: 24 mmol/L (ref 22–32)
Calcium: 8.2 mg/dL — ABNORMAL LOW (ref 8.9–10.3)
Chloride: 111 mmol/L (ref 98–111)
Creatinine, Ser: 0.79 mg/dL (ref 0.44–1.00)
GFR, Estimated: >60 mL/min (ref >60)
Glucose, Bld: 101 mg/dL — ABNORMAL HIGH (ref 70–99)
Potassium: 3.6 mmol/L (ref 3.5–5.1)
Sodium: 143 mmol/L (ref 135–145)

## 2020-07-12 LAB — CBC
HCT: 36.6 % (ref 36.0–46.0)
Hemoglobin: 11.8 g/dL — ABNORMAL LOW (ref 12.0–15.0)
MCH: 33.6 pg (ref 26.0–34.0)
MCHC: 32.2 g/dL (ref 30.0–36.0)
MCV: 104.3 fL — ABNORMAL HIGH (ref 80.0–100.0)
Platelets: 158 10*3/uL (ref 150–400)
RBC: 3.51 MIL/uL — ABNORMAL LOW (ref 3.87–5.11)
RDW: 13.9 % (ref 11.5–15.5)
WBC: 4.4 10*3/uL (ref 4.0–10.5)
nRBC: 0 % (ref 0.0–0.2)

## 2020-07-12 LAB — TSH: TSH: 6.249 u[IU]/mL — ABNORMAL HIGH (ref 0.350–4.500)

## 2020-07-12 LAB — RAPID URINE DRUG SCREEN, HOSP PERFORMED
Amphetamines: NOT DETECTED
Barbiturates: NOT DETECTED
Benzodiazepines: NOT DETECTED
Cocaine: NOT DETECTED
Opiates: NOT DETECTED
Tetrahydrocannabinol: NOT DETECTED

## 2020-07-12 MED ORDER — HYDRALAZINE HCL 20 MG/ML IJ SOLN
10.0000 mg | INTRAMUSCULAR | Status: DC | PRN
  Administered 2020-07-13: 10 mg via INTRAVENOUS
  Filled 2020-07-12 (×4): qty 1

## 2020-07-12 MED ORDER — LORAZEPAM 2 MG/ML IJ SOLN
0.5000 mg | Freq: Four times a day (QID) | INTRAMUSCULAR | Status: DC | PRN
  Administered 2020-07-12 – 2020-07-15 (×6): 0.5 mg via INTRAVENOUS
  Filled 2020-07-12 (×6): qty 1

## 2020-07-12 MED ORDER — ACETAMINOPHEN 325 MG PO TABS
650.0000 mg | ORAL_TABLET | Freq: Four times a day (QID) | ORAL | Status: DC | PRN
  Administered 2020-07-12: 650 mg via ORAL
  Filled 2020-07-12 (×2): qty 2

## 2020-07-12 MED ORDER — ASPIRIN 325 MG PO TABS
325.0000 mg | ORAL_TABLET | Freq: Every day | ORAL | Status: DC
  Administered 2020-07-12 – 2020-07-16 (×5): 325 mg via ORAL
  Filled 2020-07-12 (×5): qty 1

## 2020-07-12 MED ORDER — TAMOXIFEN CITRATE 10 MG PO TABS
20.0000 mg | ORAL_TABLET | Freq: Every day | ORAL | Status: DC
  Administered 2020-07-12 – 2020-07-16 (×5): 20 mg via ORAL
  Filled 2020-07-12 (×5): qty 2

## 2020-07-12 MED ORDER — SODIUM CHLORIDE 0.9 % IV SOLN: INTRAVENOUS | Status: DC

## 2020-07-12 MED ORDER — LEVOTHYROXINE SODIUM 25 MCG PO TABS
125.0000 ug | ORAL_TABLET | Freq: Every day | ORAL | Status: DC
  Administered 2020-07-12 – 2020-07-13 (×2): 125 ug via ORAL
  Filled 2020-07-12 (×2): qty 1

## 2020-07-12 MED ORDER — IBUPROFEN 200 MG PO TABS
600.0000 mg | ORAL_TABLET | Freq: Four times a day (QID) | ORAL | Status: DC | PRN
  Administered 2020-07-12 – 2020-07-16 (×2): 600 mg via ORAL
  Filled 2020-07-12 (×2): qty 3

## 2020-07-12 MED ORDER — ENSURE ENLIVE PO LIQD: 237.0000 mL | Freq: Two times a day (BID) | ORAL | Status: DC

## 2020-07-12 NOTE — Evaluation (Signed)
Physical Therapy Evaluation Patient Details Name: [name] MRN: [id number] DOB: [birth year] Today's Date: 07/12/2020   History of Present Illness  75 y.o. female  who presents with concerns of dizziness, nausea and vomiting. PMH:  medical history significant for breast cancer diagnosed in 2018 s/p left mastectomy, radiation and currently on tamoxifen, hypertension, CVA, hypothyroidism, COPD, asthma, CKD stage II, and GERD  Clinical Impression   Pt present with great amount of weakness. Complete modified vestibular exam, unable to do a lot due to pt with soreness in her neck. No nystagmus noted during any of assessment of eye scanning, and positional changes, however pt did report symptoms of feeling " hazy in my eyes, floaty, unfocused" , no reports of spinning. However when ambulating pt did report she has lost her balance backwards before, and she had some some pertubation's and need for Assist to correct during walking with RW today.   Cues for safety with RW , due to having it really too far in front and general weakness. Encouraged nursing to assist to [facility] or bathroom, no purewick. Pt had a lot of discussion that makes me think she is very lonely, and likes to sleep more than 1 day at a time before eating or getting up. Pt would benefit from ST-SNF and for future may need to begin to think about ALF or something with more interaction and assist for her.     Follow Up Recommendations SNF (would benefit from ST-SNF, however if has to DC home , then recommend HHPT with vestibular experience)    Equipment Recommendations  Rolling walker with 5" wheels (need youth size)    Recommendations for Other Services       Precautions / Restrictions        Mobility  Bed Mobility Overal bed mobility: Needs Assistance Bed Mobility: Supine to Sit;Sit to Supine     Supine to sit: Supervision;Min guard Sit to supine: Supervision;Min guard        Transfers   Equipment used: Rolling walker  (2 wheeled)             General transfer comment: cues for RW safety and how to use it  Ambulation/Gait Ambulation/Gait assistance: Min guard Gait Distance (Feet): 40 Feet Assistive device: Rolling walker (2 wheeled) Gait Pattern/deviations: Step-through pattern     General Gait Details: small steps, flexed posture, LOB with turns due to unsteady on feet and light headed at times  Stairs            Wheelchair Mobility    Modified Rankin (Stroke Patients Only)       Balance Overall balance assessment: Needs assistance Sitting-balance support: Bilateral upper extremity supported;Feet supported Sitting balance-Leahy Scale: Normal     Standing balance support: During functional activity Standing balance-Leahy Scale: Fair Standing balance comment: with RW                             Pertinent Vitals/Pain Pain Assessment: No/denies pain    Home Living Family/patient expects to be discharged to:: Private residence (lives in apartment building high rise with elevators.) Living Arrangements: Alone (dtr lives 1 hour away and visits her 1x/ month to pay the bilss and get groceries)   Type of Home: Apartment Home Access: Elevator     Home Layout: One level Home Equipment: Cane - single point      Prior Function Level of Independence: Independent with assistive device(s)  Comments: tries to use cane but it is not enough. Pt states that she has not been able to get around much or care for herself very well due to feeling so weak all the time. She is lonely, and just feels like she may die sometimes, she is only eating peanut butter and jelly sandwichs because does not have energy to make it into the kitchen.     Hand Dominance        Extremity/Trunk Assessment        Lower Extremity Assessment Lower Extremity Assessment: Generalized weakness       Communication   Communication: No difficulties  Cognition Arousal/Alertness:  Awake/alert Behavior During Therapy: WFL for tasks assessed/performed Overall Cognitive Status: Within Functional Limits for tasks assessed                                        General Comments      Exercises     Assessment/Plan    PT Assessment Patient needs continued PT services  PT Problem List Decreased strength;Decreased activity tolerance;Decreased mobility       PT Treatment Interventions DME instruction;Gait training;Functional mobility training;Therapeutic activities;Therapeutic exercise;Patient/family education    PT Goals (Current goals can be found in the Care Plan section)  Acute Rehab PT Goals Patient Stated Goal: I want to feel better again PT Goal Formulation: With patient Time For Goal Achievement: 07/26/20 Potential to Achieve Goals: Good    Frequency Min 3X/week   Barriers to discharge        Co-evaluation               AM-PAC PT "6 Clicks" Mobility  Outcome Measure Help needed turning from your back to your side while in a flat bed without using bedrails?: A Little Help needed moving from lying on your back to sitting on the side of a flat bed without using bedrails?: A Little Help needed moving to and from a bed to a chair (including a wheelchair)?: A Little Help needed standing up from a chair using your arms (e.g., wheelchair or bedside chair)?: A Little Help needed to walk in hospital room?: A Little Help needed climbing 3-5 steps with a railing? : A Little 6 Click Score: 18    End of Session Equipment Utilized During Treatment: Gait belt Activity Tolerance: Patient tolerated treatment well Patient left: in chair;with chair alarm set Nurse Communication: Mobility status PT Visit Diagnosis: Unsteadiness on feet (R26.81);Muscle weakness (generalized) (M62.81)    Time: 4696-2952 PT Time Calculation (min) (ACUTE ONLY): 38 min   Charges:   PT Evaluation $PT Eval Low Complexity: 1 Low PT Treatments $Gait Training:  8-22 mins $Therapeutic Activity: 8-22 mins        Gardy Montanari, PT, MPT Acute Rehabilitation Services Office: [phone] Pager: [phone] 07/12/2020   Clide Dales 07/12/2020, 6:17 PM

## 2020-07-12 NOTE — Progress Notes (Signed)
PROGRESS NOTE    [name]  RWE:[id number] DOB: [birth year] DOA: 07/11/2020 PCP: Shary Key, DO    Brief Narrative:   75 y.o. female with medical history significant for breast cancer diagnosed in 2018 s/p left mastectomy, radiation and currently on tamoxifen, hypertension, CVA, hypothyroidism, COPD, asthma, CKD stage II, and GERD who presents with concerns of dizziness, nausea and vomiting.  Assessment & Plan:   Principal Problem:   Dizzy Active Problems:   Hypothyroidism   Depression   History of CVA (cerebrovascular accident)   History of breast cancer   CKD (chronic kidney disease) stage 2, GFR 60-89 ml/min   Prolonged QT interval   Dizziness -uncertain etiology, suspect benign vertigo -CT head and neck unremarkable, as is MRI brain -Seen by PT, pending input -This AM, pt reports dizziness is much improved -Still feeling "lightheaded" when ambulating -Pt is clinically dehydrated with dry mucus membranes, see below  Dehydration -Dry mucus membranes on exam -Suspect secondary to decreased PO intake as a result of above dizziness with associated nausea -Will continue patient on IVF hydration -Repeat bmet in AM -2d echo reviewed, unremarkable with normal LVEF  Hypertensive urgency Not on antihypertensives Given one time dose of IV hydralazine in ED with improvement Subsequent BP appears much improved  Prolonged QT Avoid prolonged QT medication  Hx of breast cancer s/p left mastectomy 2018, radiation Patient last saw oncology in December 2021 and was recommended right breast mammogram but has been unable to obtain due to transportation issue. CTA neck showing sclerotic lesion concerning for metastatic disease-Would have pt follow up closely with Oncology Continue tamoxifen  Depression Hold Lexapro for now due to prolonged QT  Hx of CVA Continue aspirin  Hypothyroidism Continue Synthroid  CKD stage II Creatinine stable Repeat cmp in  AM  DVT prophylaxis: Lovenox subq Code Status: Full Family Communication: Pt in room, family not at bedside  Status is: Observation  The patient will require care spanning > 2 midnights and should be moved to inpatient because: IV treatments appropriate due to intensity of illness or inability to take PO and Inpatient level of care appropriate due to severity of illness  Dispo: The patient is from: Home              Anticipated d/c is to: Home              Patient currently is not medically stable to d/c.   Difficult to place patient No       Consultants:     Procedures:     Antimicrobials: Anti-infectives (From admission, onward)   None       Subjective: Reports dizziness is improved. Still feels "lightheaded"  Objective: Vitals:   07/12/20 1230 07/12/20 1300 07/12/20 1330 07/12/20 1400  BP: 110/69 135/81 (!) 139/92 128/88  Pulse: 77 79 79 81  Resp: 18 16 18 16   Temp:   98.2 F (36.8 C) 98.4 F (36.9 C)  TempSrc:   Oral Oral  SpO2: 97% 95% 94% 97%  Weight:      Height:        Intake/Output Summary (Last 24 hours) at 07/12/2020 1747 Last data filed at 07/11/2020 2024 Gross per 24 hour  Intake 1000 ml  Output --  Net 1000 ml   Filed Weights   07/11/20 1734  Weight: 51.3 kg    Examination: General exam: Awake, laying in bed, in nad Respiratory system: Normal respiratory effort, no wheezing Cardiovascular system: regular rate, s1, s2 Gastrointestinal  system: Soft, nondistended, positive BS Central nervous system: CN2-12 grossly intact, strength intact Extremities: Perfused, no clubbing Skin: Normal skin turgor, no notable skin lesions seen Psychiatry: Mood normal // no visual hallucinations   Data Reviewed: I have personally reviewed following labs and imaging studies  CBC: Recent Labs  Lab 07/06/20 1813 07/11/20 1759 07/11/20 1813 07/12/20 0500  WBC 4.4 4.2  --  4.4  NEUTROABS 3.0 2.8  --   --   HGB 12.6 12.3 12.6 11.8*  HCT 40.0 38.8  37.0 36.6  MCV 105.8* 103.2*  --  104.3*  PLT 172 189  --  956   Basic Metabolic Panel: Recent Labs  Lab 07/06/20 1813 07/11/20 1759 07/11/20 1813 07/12/20 0500  NA 142 143 144 143  K 3.5 3.6 3.6 3.6  CL 112* 110 111 111  CO2 21* 25  --  24  GLUCOSE 99 89 85 101*  BUN 17 21 21 17   CREATININE 0.89 0.74 0.80 0.79  CALCIUM 8.6* 8.3*  --  8.2*   GFR: Estimated Creatinine Clearance: 41.3 mL/min (by C-G formula based on SCr of 0.79 mg/dL). Liver Function Tests: Recent Labs  Lab 07/06/20 1813 07/11/20 1759  AST 15 12*  ALT 8 8  ALKPHOS 368* 392*  BILITOT 0.5 0.6  PROT 5.8* 5.9*  ALBUMIN 3.4* 3.3*   Recent Labs  Lab 07/06/20 1813 07/11/20 1759  LIPASE 35 37   No results for input(s): AMMONIA in the last 168 hours. Coagulation Profile: No results for input(s): INR, PROTIME in the last 168 hours. Cardiac Enzymes: No results for input(s): CKTOTAL, CKMB, CKMBINDEX, TROPONINI in the last 168 hours. BNP (last 3 results) No results for input(s): PROBNP in the last 8760 hours. HbA1C: No results for input(s): HGBA1C in the last 72 hours. CBG: No results for input(s): GLUCAP in the last 168 hours. Lipid Profile: No results for input(s): CHOL, HDL, LDLCALC, TRIG, CHOLHDL, LDLDIRECT in the last 72 hours. Thyroid Function Tests: Recent Labs    07/12/20 0500  TSH 6.249*   Anemia Panel: No results for input(s): VITAMINB12, FOLATE, FERRITIN, TIBC, IRON, RETICCTPCT in the last 72 hours. Sepsis Labs: No results for input(s): PROCALCITON, LATICACIDVEN in the last 168 hours.  Recent Results (from the past 240 hour(s))  Resp Panel by RT-PCR (Flu A&B, Covid) Nasopharyngeal Swab     Status: None   Collection Time: 07/11/20  8:27 PM   Specimen: Nasopharyngeal Swab; Nasopharyngeal(NP) swabs in vial transport medium  Result Value Ref Range Status   SARS Coronavirus 2 by RT PCR NEGATIVE NEGATIVE Final    Comment: (NOTE) SARS-CoV-2 target nucleic acids are NOT DETECTED.  The  SARS-CoV-2 RNA is generally detectable in upper respiratory specimens during the acute phase of infection. The lowest concentration of SARS-CoV-2 viral copies this assay can detect is 138 copies/mL. A negative result does not preclude SARS-Cov-2 infection and should not be used as the sole basis for treatment or other patient management decisions. A negative result may occur with  improper specimen collection/handling, submission of specimen other than nasopharyngeal swab, presence of viral mutation(s) within the areas targeted by this assay, and inadequate number of viral copies(<138 copies/mL). A negative result must be combined with clinical observations, patient history, and epidemiological information. The expected result is Negative.  Fact Sheet for Patients:  EntrepreneurPulse.com.au  Fact Sheet for Healthcare Providers:  IncredibleEmployment.be  This test is no t yet approved or cleared by the [place] FDA and  has been authorized for detection and/or  diagnosis of SARS-CoV-2 by FDA under an Emergency Use Authorization (EUA). This EUA will remain  in effect (meaning this test can be used) for the duration of the COVID-19 declaration under Section 564(b)(1) of the Act, 21 U.S.C.section 360bbb-3(b)(1), unless the authorization is terminated  or revoked sooner.       Influenza A by PCR NEGATIVE NEGATIVE Final   Influenza B by PCR NEGATIVE NEGATIVE Final    Comment: (NOTE) The Xpert Xpress SARS-CoV-2/FLU/RSV plus assay is intended as an aid in the diagnosis of influenza from Nasopharyngeal swab specimens and should not be used as a sole basis for treatment. Nasal washings and aspirates are unacceptable for Xpert Xpress SARS-CoV-2/FLU/RSV testing.  Fact Sheet for Patients: EntrepreneurPulse.com.au  Fact Sheet for Healthcare Providers: IncredibleEmployment.be  This test is not yet approved or  cleared by the [place] FDA and has been authorized for detection and/or diagnosis of SARS-CoV-2 by FDA under an Emergency Use Authorization (EUA). This EUA will remain in effect (meaning this test can be used) for the duration of the COVID-19 declaration under Section 564(b)(1) of the Act, 21 U.S.C. section 360bbb-3(b)(1), unless the authorization is terminated or revoked.  Performed at [facility], [place] [address], [place], [place] [address]      Radiology Studies: CT Angio Head W or Wo Contrast  Result Date: 07/11/2020 CLINICAL DATA:  Dizziness and nausea EXAM: CT ANGIOGRAPHY HEAD AND NECK TECHNIQUE: Multidetector CT imaging of the head and neck was performed using the standard protocol during bolus administration of intravenous contrast. Multiplanar CT image reconstructions and MIPs were obtained to evaluate the vascular anatomy. Carotid stenosis measurements (when applicable) are obtained utilizing NASCET criteria, using the distal internal carotid diameter as the denominator. CONTRAST:  31mL OMNIPAQUE IOHEXOL 350 MG/ML SOLN COMPARISON:  None. FINDINGS: CT HEAD FINDINGS Brain: There is no mass, hemorrhage or extra-axial collection. The size and configuration of the ventricles and extra-axial CSF spaces are normal. There is hypoattenuation of the periventricular white matter, most commonly indicating chronic ischemic microangiopathy. Skull: The visualized skull base, calvarium and extracranial soft tissues are normal. Sinuses/Orbits: No fluid levels or advanced mucosal thickening of the visualized paranasal sinuses. No mastoid or middle ear effusion. The orbits are normal. CTA NECK FINDINGS SKELETON: Diffuse sclerotic change of the vertebral column. OTHER NECK: Normal pharynx, larynx and major salivary glands. No cervical lymphadenopathy. Unremarkable thyroid gland. UPPER CHEST: No pneumothorax or pleural effusion. No nodules or masses. AORTIC ARCH: There is calcific  atherosclerosis of the aortic arch. There is no aneurysm, dissection or hemodynamically significant stenosis of the visualized portion of the aorta. Conventional 3 vessel aortic branching pattern. The visualized proximal subclavian arteries are widely patent. RIGHT CAROTID SYSTEM: Normal without aneurysm, dissection or stenosis. LEFT CAROTID SYSTEM: Normal without aneurysm, dissection or stenosis. VERTEBRAL ARTERIES: Left dominant configuration. Both origins are clearly patent. There is no dissection, occlusion or flow-limiting stenosis to the skull base (V1-V3 segments). CTA HEAD FINDINGS POSTERIOR CIRCULATION: --Vertebral arteries: Normal V4 segments. --Inferior cerebellar arteries: Normal. --Basilar artery: Normal. --Superior cerebellar arteries: Normal. --Posterior cerebral arteries (PCA): Multifocal mild-to-moderate stenosis. ANTERIOR CIRCULATION: --Intracranial internal carotid arteries: Atherosclerotic calcification of the internal carotid arteries at the skull base without hemodynamically significant stenosis. --Anterior cerebral arteries (ACA): Normal. Both A1 segments are present. Patent anterior communicating artery (a-comm). --Middle cerebral arteries (MCA): Multifocal atherosclerotic irregularity without high-grade stenosis. VENOUS SINUSES: As permitted by contrast timing, patent. ANATOMIC VARIANTS: None Review of the MIP images confirms the above findings. IMPRESSION: 1. No  emergent large vessel occlusion or high-grade stenosis of the head or neck. 2. Multifocal mild-to-moderate stenosis of both posterior cerebral arteries and otherwise diffuse mild atherosclerotic irregularity of the intracranial arteries. 3. Diffuse sclerotic change within the axial skeleton, concerning for sclerotic metastatic disease in a patient with history of breast cancer. Other possibilities include hyperparathyroidism or renal osteodystrophy. Aortic Atherosclerosis (ICD10-I70.0). Electronically Signed   By: Ulyses Jarred M.D.    On: 07/11/2020 19:50   CT Angio Neck W and/or Wo Contrast  Result Date: 07/11/2020 CLINICAL DATA:  Dizziness and nausea EXAM: CT ANGIOGRAPHY HEAD AND NECK TECHNIQUE: Multidetector CT imaging of the head and neck was performed using the standard protocol during bolus administration of intravenous contrast. Multiplanar CT image reconstructions and MIPs were obtained to evaluate the vascular anatomy. Carotid stenosis measurements (when applicable) are obtained utilizing NASCET criteria, using the distal internal carotid diameter as the denominator. CONTRAST:  73mL OMNIPAQUE IOHEXOL 350 MG/ML SOLN COMPARISON:  None. FINDINGS: CT HEAD FINDINGS Brain: There is no mass, hemorrhage or extra-axial collection. The size and configuration of the ventricles and extra-axial CSF spaces are normal. There is hypoattenuation of the periventricular white matter, most commonly indicating chronic ischemic microangiopathy. Skull: The visualized skull base, calvarium and extracranial soft tissues are normal. Sinuses/Orbits: No fluid levels or advanced mucosal thickening of the visualized paranasal sinuses. No mastoid or middle ear effusion. The orbits are normal. CTA NECK FINDINGS SKELETON: Diffuse sclerotic change of the vertebral column. OTHER NECK: Normal pharynx, larynx and major salivary glands. No cervical lymphadenopathy. Unremarkable thyroid gland. UPPER CHEST: No pneumothorax or pleural effusion. No nodules or masses. AORTIC ARCH: There is calcific atherosclerosis of the aortic arch. There is no aneurysm, dissection or hemodynamically significant stenosis of the visualized portion of the aorta. Conventional 3 vessel aortic branching pattern. The visualized proximal subclavian arteries are widely patent. RIGHT CAROTID SYSTEM: Normal without aneurysm, dissection or stenosis. LEFT CAROTID SYSTEM: Normal without aneurysm, dissection or stenosis. VERTEBRAL ARTERIES: Left dominant configuration. Both origins are clearly patent.  There is no dissection, occlusion or flow-limiting stenosis to the skull base (V1-V3 segments). CTA HEAD FINDINGS POSTERIOR CIRCULATION: --Vertebral arteries: Normal V4 segments. --Inferior cerebellar arteries: Normal. --Basilar artery: Normal. --Superior cerebellar arteries: Normal. --Posterior cerebral arteries (PCA): Multifocal mild-to-moderate stenosis. ANTERIOR CIRCULATION: --Intracranial internal carotid arteries: Atherosclerotic calcification of the internal carotid arteries at the skull base without hemodynamically significant stenosis. --Anterior cerebral arteries (ACA): Normal. Both A1 segments are present. Patent anterior communicating artery (a-comm). --Middle cerebral arteries (MCA): Multifocal atherosclerotic irregularity without high-grade stenosis. VENOUS SINUSES: As permitted by contrast timing, patent. ANATOMIC VARIANTS: None Review of the MIP images confirms the above findings. IMPRESSION: 1. No emergent large vessel occlusion or high-grade stenosis of the head or neck. 2. Multifocal mild-to-moderate stenosis of both posterior cerebral arteries and otherwise diffuse mild atherosclerotic irregularity of the intracranial arteries. 3. Diffuse sclerotic change within the axial skeleton, concerning for sclerotic metastatic disease in a patient with history of breast cancer. Other possibilities include hyperparathyroidism or renal osteodystrophy. Aortic Atherosclerosis (ICD10-I70.0). Electronically Signed   By: Ulyses Jarred M.D.   On: 07/11/2020 19:50   MR Brain W and Wo Contrast  Result Date: 07/11/2020 CLINICAL DATA:  Dizziness and nausea EXAM: MRI HEAD WITHOUT AND WITH CONTRAST TECHNIQUE: Multiplanar, multiecho pulse sequences of the brain and surrounding structures were obtained without and with intravenous contrast. CONTRAST:  21mL GADAVIST GADOBUTROL 1 MMOL/ML IV SOLN COMPARISON:  10/19/2009 FINDINGS: Brain: No acute infarct, mass effect or extra-axial collection. No  acute or chronic  hemorrhage. Hyperintense T2-weighted signal is moderately widespread throughout the white matter. Parenchymal volume and CSF spaces are normal. The midline structures are normal. There is no abnormal contrast enhancement. Vascular: Major flow voids are preserved. Skull and upper cervical spine: Normal calvarium and skull base. Visualized upper cervical spine and soft tissues are normal. Sinuses/Orbits:No paranasal sinus fluid levels or advanced mucosal thickening. No mastoid or middle ear effusion. Normal orbits. IMPRESSION: 1. No acute intracranial abnormality. 2. Findings of chronic microvascular ischemia. Electronically Signed   By: Ulyses Jarred M.D.   On: 07/11/2020 21:21   DG Chest Port 1 View  Result Date: 07/11/2020 CLINICAL DATA:  Vomiting.  Near syncope. EXAM: PORTABLE CHEST 1 VIEW COMPARISON:  06/15/2019. FINDINGS: Normal heart size. Unchanged mediastinal contours with aortic atherosclerosis. Chronic hyperinflation and interstitial coarsening. No pneumothorax, pneumomediastinum, or pleural effusion. No focal airspace disease. No pulmonary edema. Surgical clips in the left thoracic inlet. Left mastectomy. IMPRESSION: 1. No acute abnormality. 2. Chronic hyperinflation and bronchial thickening, imaging findings consistent with COPD. Aortic Atherosclerosis (ICD10-I70.0). Electronically Signed   By: Keith Rake M.D.   On: 07/11/2020 17:46   ECHOCARDIOGRAM COMPLETE  Result Date: 07/12/2020    ECHOCARDIOGRAM REPORT   Patient Name:   [name] [facility] Date of Exam: 07/12/2020 Medical Rec #:  [id number]     Height:       57.0 in Accession #:    [id number]    Weight:       113.1 lb Date of Birth:  1945-05-21     BSA:          1.412 m Patient Age:    74 years      BP:           130/69 mmHg Patient Gender: F             HR:           71 bpm. Exam Location:  Inpatient Procedure: 2D Echo, 3D Echo, Cardiac Doppler and Color Doppler Indications:    Elevated troponin. ; R07.9* Chest pain, unspecified  History:         Patient has prior history of Echocardiogram examinations, most                 recent 10/20/2009. Abnormal ECG, TIA, Stroke and COPD,                 Signs/Symptoms:Dizziness/Lightheadedness; Risk                 Factors:Hypertension, Dyslipidemia and Current Smoker. Breast                 cancer.  Sonographer:    Roseanna Rainbow RDCS Referring Phys: [id number] [place] T TU  Sonographer Comments: Technically difficult study due to poor echo windows. Image acquisition challenging due to mastectomy. IMPRESSIONS  1. Inferobasal hypokinesis . Left ventricular ejection fraction, by estimation, is 55%. The left ventricle has normal function. The left ventricle demonstrates regional wall motion abnormalities (see scoring diagram/findings for description). Left ventricular diastolic parameters were normal.  2. Right ventricular systolic function is normal. The right ventricular size is normal.  3. The mitral valve is abnormal. Moderate mitral valve regurgitation. No evidence of mitral stenosis.  4. The aortic valve is normal in structure. Aortic valve regurgitation is not visualized. No aortic stenosis is present.  5. The inferior vena cava is normal in size with greater than 50% respiratory variability, suggesting right atrial pressure of 3 mmHg.  FINDINGS  Left Ventricle: Inferobasal hypokinesis. Left ventricular ejection fraction, by estimation, is 55%. The left ventricle has normal function. The left ventricle demonstrates regional wall motion abnormalities. The left ventricular internal cavity size was  normal in size. There is no left ventricular hypertrophy. Left ventricular diastolic parameters were normal. Right Ventricle: The right ventricular size is normal. No increase in right ventricular wall thickness. Right ventricular systolic function is normal. Left Atrium: Left atrial size was normal in size. Right Atrium: Right atrial size was normal in size. Pericardium: There is no evidence of pericardial effusion. Mitral Valve:  The mitral valve is abnormal. There is moderate thickening of the mitral valve leaflet(s). There is moderate calcification of the mitral valve leaflet(s). Moderate mitral valve regurgitation. No evidence of mitral valve stenosis. Tricuspid Valve: The tricuspid valve is normal in structure. Tricuspid valve regurgitation is trivial. No evidence of tricuspid stenosis. Aortic Valve: The aortic valve is normal in structure. Aortic valve regurgitation is not visualized. No aortic stenosis is present. Pulmonic Valve: The pulmonic valve was normal in structure. Pulmonic valve regurgitation is not visualized. No evidence of pulmonic stenosis. Aorta: The aortic root is normal in size and structure. Venous: The inferior vena cava is normal in size with greater than 50% respiratory variability, suggesting right atrial pressure of 3 mmHg. IAS/Shunts: No atrial level shunt detected by color flow Doppler.  LEFT VENTRICLE PLAX 2D LVIDd:         4.30 cm     Diastology LVIDs:         3.30 cm     LV e' medial:    6.09 cm/s LV PW:         1.00 cm     LV E/e' medial:  13.2 LV IVS:        0.90 cm     LV e' lateral:   7.18 cm/s LVOT diam:     2.10 cm     LV E/e' lateral: 11.2 LV SV:         53 LV SV Index:   37 LVOT Area:     3.46 cm                             3D Volume EF: LV Volumes (MOD)           3D EF:        52 % LV vol d, MOD A2C: 54.0 ml LV EDV:       117 ml LV vol d, MOD A4C: 56.0 ml LV ESV:       56 ml LV vol s, MOD A2C: 25.5 ml LV SV:        61 ml LV vol s, MOD A4C: 28.6 ml LV SV MOD A2C:     28.5 ml LV SV MOD A4C:     56.0 ml LV SV MOD BP:      28.2 ml RIGHT VENTRICLE            IVC RV S prime:     8.92 cm/s  IVC diam: 1.60 cm TAPSE (M-mode): 1.9 cm LEFT ATRIUM           Index       RIGHT ATRIUM           Index LA diam:      2.80 cm 1.98 cm/m  RA Area:     11.20 cm LA Vol (A2C): 31.1 ml 22.03 ml/m RA  Volume:   23.90 ml  16.93 ml/m LA Vol (A4C): 14.6 ml 10.34 ml/m  AORTIC VALVE LVOT Vmax:   83.60 cm/s LVOT Vmean:  58.100  cm/s LVOT VTI:    0.152 m  AORTA Ao Root diam: 2.80 cm MITRAL VALVE MV Area (PHT): 3.17 cm      SHUNTS MV Decel Time: 239 msec      Systemic VTI:  0.15 m MR Peak grad:    139.7 mmHg  Systemic Diam: 2.10 cm MR Mean grad:    79.0 mmHg MR Vmax:         591.00 cm/s MR Vmean:        410.5 cm/s MR PISA:         1.01 cm MR PISA Eff ROA: 6 mm MR PISA Radius:  0.40 cm MV E velocity: 80.20 cm/s MV A velocity: 89.00 cm/s MV E/A ratio:  0.90 Jenkins Rouge MD Electronically signed by Jenkins Rouge MD Signature Date/Time: 07/12/2020/11:20:25 AM    Final     Scheduled Meds: . aspirin  325 mg Oral Daily  . enoxaparin (LOVENOX) injection  40 mg Subcutaneous Q24H  . levothyroxine  125 mcg Oral Q0600  . tamoxifen  20 mg Oral Daily   Continuous Infusions: . sodium chloride       LOS: 0 days   Marylu Lund, MD [facility] Pager On Amion  If 7PM-7AM, please contact night-coverage 07/12/2020, 5:47 PM

## 2020-07-12 NOTE — Progress Notes (Signed)
  Echocardiogram 2D Echocardiogram has been performed.  [name] 07/12/2020, 11:05 AM

## 2020-07-13 DIAGNOSIS — E860 Dehydration: Principal | ICD-10-CM

## 2020-07-13 LAB — COMPREHENSIVE METABOLIC PANEL
ALT: 13 U/L (ref 0–44)
AST: 20 U/L (ref 15–41)
Albumin: 3 g/dL — ABNORMAL LOW (ref 3.5–5.0)
Alkaline Phosphatase: 354 U/L — ABNORMAL HIGH (ref 38–126)
Anion gap: 8 (ref 5–15)
BUN: 20 mg/dL (ref 8–23)
CO2: 24 mmol/L (ref 22–32)
Calcium: 8.5 mg/dL — ABNORMAL LOW (ref 8.9–10.3)
Chloride: 111 mmol/L (ref 98–111)
Creatinine, Ser: 0.8 mg/dL (ref 0.44–1.00)
GFR, Estimated: >60 mL/min (ref >60)
Glucose, Bld: 113 mg/dL — ABNORMAL HIGH (ref 70–99)
Potassium: 4 mmol/L (ref 3.5–5.1)
Sodium: 143 mmol/L (ref 135–145)
Total Bilirubin: 0.4 mg/dL (ref 0.3–1.2)
Total Protein: 5.3 g/dL — ABNORMAL LOW (ref 6.5–8.1)

## 2020-07-13 LAB — CBC
HCT: 35.7 % — ABNORMAL LOW (ref 36.0–46.0)
Hemoglobin: 11.2 g/dL — ABNORMAL LOW (ref 12.0–15.0)
MCH: 33 pg (ref 26.0–34.0)
MCHC: 31.4 g/dL (ref 30.0–36.0)
MCV: 105.3 fL — ABNORMAL HIGH (ref 80.0–100.0)
Platelets: 180 10*3/uL (ref 150–400)
RBC: 3.39 MIL/uL — ABNORMAL LOW (ref 3.87–5.11)
RDW: 13.8 % (ref 11.5–15.5)
WBC: 4.1 10*3/uL (ref 4.0–10.5)
nRBC: 0 % (ref 0.0–0.2)

## 2020-07-13 MED ORDER — ONDANSETRON HCL 4 MG/2ML IJ SOLN
4.0000 mg | Freq: Once | INTRAMUSCULAR | Status: AC
  Administered 2020-07-13: 4 mg via INTRAVENOUS
  Filled 2020-07-13: qty 2

## 2020-07-13 MED ORDER — LEVOTHYROXINE SODIUM 50 MCG PO TABS
150.0000 ug | ORAL_TABLET | Freq: Every day | ORAL | Status: DC
  Administered 2020-07-14 – 2020-07-16 (×3): 150 ug via ORAL
  Filled 2020-07-13 (×3): qty 1

## 2020-07-13 NOTE — NC FL2 (Signed)
[place] LEVEL OF CARE SCREENING TOOL     IDENTIFICATION  Patient Name: [name] Birthdate: 07-16-1945 Sex: female Admission Date (Current Location): 07/11/2020  [facility] and [place] Number:  Herbalist and Address:  [facility],  [place], [place]      Provider Number: [id number]  Attending Physician Name and Address:  Donne Hazel, MD  Relative Name and Phone Number:       Current Level of Care: Hospital Recommended Level of Care: [place] Prior Approval Number:    Date Approved/Denied:   PASRR Number: pending  Discharge Plan: SNF    Current Diagnoses: Patient Active Problem List   Diagnosis Date Noted  . History of CVA (cerebrovascular accident) 07/12/2020  . History of breast cancer 07/12/2020  . CKD (chronic kidney disease) stage 2, GFR 60-89 ml/min 07/12/2020  . Prolonged QT interval 07/12/2020  . Dehydration 07/12/2020  . Dizzy 07/11/2020  . Skin lesion of right leg 04/24/2020  . Inclusion cyst 04/24/2020  . Skin lesion of right arm 08/16/2019  . Sleep disturbance 06/13/2018  . Health education 06/13/2018  . Chronic left hip pain 12/29/2017  . Late onset Alzheimer's disease with behavioral disturbance ([place]) 12/29/2017  . Cancer of left breast, stage 1, estrogen receptor positive ([place]) 05/13/2017  . Malignant neoplasm of overlapping sites of left breast in female, estrogen receptor positive ([place]) 03/08/2017  . Lesion of nose 03/02/2017  . Callus of foot 11/20/2016  . Loss of appetite 12/29/2015  . Fatigue 12/29/2015  . Bipolar affective disorder, currently depressed, moderate ([place]) 11/26/2015  . Depression   . Severe episode of recurrent major depressive disorder, without psychotic features ([place])   . Foreign body in foot, right 01/09/2015  . Blister of right ankle without infection 01/09/2015  . Sinusitis 11/26/2014  . Rotator cuff syndrome of right shoulder 11/14/2014  .  Varicose veins of both lower extremities with pain 10/29/2014  . Neck pain 09/26/2014  . Metatarsal fracture 08/16/2014  . Pain in joint, shoulder region 08/16/2014  . Facial skin lesion 08/01/2014  . Leg edema, left 07/25/2014  . Edema of left lower extremity 07/22/2014  . Essential hypertension   . Thyroid activity decreased   . HLD (hyperlipidemia)   . Healthcare maintenance 05/31/2014  . Emotional neurotic disorder 05/07/2014  . Seborrheic keratosis 12/05/2013  . 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 Mass of left side of neck 10/17/2012  . At high risk for falls 06/28/2012  . Allergic rhinitis 06/16/2012  . Mass of left breast 10/27/2011  . Hearing loss 01/07/2011  . Foot pain 11/25/2010  . Thyroid nodule 10/16/2010  . Nausea 09/22/2010  . URINARY INCONTINENCE, STRESS, FEMALE 04/03/2010  . HYPERLIPIDEMIA 03/25/2010  . PULMONARY NODULE 12/23/2009  . COPD (chronic obstructive pulmonary disease) ([place]) 12/03/2009  . Tobacco abuse counseling 11/05/2009  . CVA 11/05/2009  . BIPOLAR DISORDER UNSPECIFIED 11/28/2008  . Hypothyroidism 05/06/2008  . OCD (obsessive compulsive disorder) 10/13/2006  . GERD 10/13/2006  . OSTEOPOROSIS 10/13/2006    Orientation RESPIRATION BLADDER Height & Weight     Self,Time,Situation,Place  Normal Incontinent Weight: 51.3 kg Height:  4\' 9"  (144.8 cm)  BEHAVIORAL SYMPTOMS/MOOD NEUROLOGICAL BOWEL NUTRITION STATUS      Continent Diet  AMBULATORY STATUS COMMUNICATION OF NEEDS Skin   Extensive Assist Verbally Normal                       Personal Care  Assistance Level of Assistance  Bathing,Dressing,Total care Bathing Assistance: Maximum assistance   Dressing Assistance: Maximum assistance Total Care Assistance: Maximum assistance   Functional Limitations Info             SPECIAL CARE FACTORS FREQUENCY  PT (By licensed PT),OT (By licensed OT)     PT Frequency: 5x weekly OT Frequency: 5x weekly            Contractures  Contractures Info: Not present    Additional Factors Info  Code Status,Allergies Code Status Info: full Allergies Info: Seroquel (Quetiapine Fumarate), Amoxicillin, Ciprofloxacin, Haloperidol Lactate, Ketorolac Tromethamine, Latuda (Lurasidone Hcl), Thorazine (Chlorpromazine), Wellbutrin (Bupropion)           Current Medications (07/13/2020):  This is the current hospital active medication list Current Facility-Administered Medications  Medication Dose Route Frequency Provider Last Rate Last Admin  . 0.9 %  sodium chloride infusion   Intravenous Continuous Donne Hazel, MD 75 mL/hr at 07/12/20 2049 New Bag at 07/12/20 2049  . acetaminophen (TYLENOL) tablet 650 mg  650 mg Oral Q6H PRN Tu, Ching T, DO   650 mg at 07/12/20 0035  . aspirin tablet 325 mg  325 mg Oral Daily Tu, Ching T, DO   325 mg at 07/13/20 7482  . enoxaparin (LOVENOX) injection 40 mg  40 mg Subcutaneous Q24H Tu, Ching T, DO   40 mg at 07/13/20 0921  . feeding supplement (ENSURE ENLIVE / ENSURE PLUS) liquid 237 mL  237 mL Oral BID BM Donne Hazel, MD      . hydrALAZINE (APRESOLINE) injection 10 mg  10 mg Intravenous Q4H PRN Donne Hazel, MD      . ibuprofen (ADVIL) tablet 600 mg  600 mg Oral Q6H PRN Tu, Ching T, DO   600 mg at 07/12/20 0035  . levothyroxine (SYNTHROID) tablet 125 mcg  125 mcg Oral Q0600 Tu, Ching T, DO   125 mcg at 07/13/20 0533  . LORazepam (ATIVAN) injection 0.5 mg  0.5 mg Intravenous Q6H PRN Tu, Ching T, DO   0.5 mg at 07/12/20 2311  . tamoxifen (NOLVADEX) tablet 20 mg  20 mg Oral Daily Tu, Ching T, DO   20 mg at 07/13/20 7078     Discharge Medications: Please see discharge summary for a list of discharge medications.  Relevant Imaging Results:  Relevant Lab Results:   Additional Information SSN [id number]  Joaquin Courts, RN

## 2020-07-13 NOTE — Evaluation (Signed)
Occupational Therapy Evaluation Patient Details Name: [name] MRN: [id number] DOB: 12-02-1945 Today's Date: 07/13/2020    History of Present Illness 75 y.o. female  who presents with concerns of dizziness, nausea and vomiting. PMH:  medical history significant for breast cancer diagnosed in 2018 s/p left mastectomy, radiation and currently on tamoxifen, hypertension, CVA, hypothyroidism, COPD, asthma, CKD stage II, and GERD   Clinical Impression   [name] is a 75 year old woman who presents with generalized weakness, decreased activity tolerance, impaired balance and persistent complaints of dizziness that causes nausea. On evaluation patient reports room spinning when rolling right but not left and with sitting up. No nystagmus noted in eyes with movement but patient symptomatic and holding her head in her hands at edge of bed. Suspect some form of vestibular hypofunction but not specifically BPPV. Some reports of nausea and requesting medicine which therapist relayed to RN. Patient unable to bend over to reach feet for ADLs and only able to stand and take steps up the head of the bed with RW. Patient will benefit from skilled OT services while in hospital to improve deficits and learn compensatory strategies as needed to improve independence and safety in order to return home. Patient will benefit from short term rehab prior to return home.    Follow Up Recommendations  SNF    Equipment Recommendations  None recommended by OT    Recommendations for Other Services       Precautions / Restrictions Precautions Precautions: Fall Restrictions Weight Bearing Restrictions: No      Mobility Bed Mobility Overal bed mobility: Needs Assistance Bed Mobility: Rolling;Supine to Sit;Sit to Supine Rolling: Supervision   Supine to sit: Min assist;HOB elevated Sit to supine: Min assist;HOB elevated        Transfers Overall transfer level: Needs assistance Equipment used:  Rolling walker (2 wheeled) Transfers: Sit to/from Stand Sit to Stand: Min guard         General transfer comment: Min guard to stand with RW and take steps to head of bed.    Balance Overall balance assessment: Mild deficits observed, not formally tested Sitting-balance support: No upper extremity supported Sitting balance-Leahy Scale: Good     Standing balance support: During functional activity Standing balance-Leahy Scale: Fair Standing balance comment: with RW                           ADL either performed or assessed with clinical judgement   ADL Overall ADL's : Needs assistance/impaired Eating/Feeding: Independent   Grooming: Set up;Sitting   Upper Body Bathing: Set up;Sitting   Lower Body Bathing: Moderate assistance;Sit to/from stand   Upper Body Dressing : Set up;Sitting   Lower Body Dressing: Sit to/from stand;Maximal assistance   Toilet Transfer: Min guard;Stand-pivot;BSC;RW   Toileting- Clothing Manipulation and Hygiene: Moderate assistance;Sit to/from stand               Vision Baseline Vision/History: Cataracts Patient Visual Report: Blurring of vision (reports vision as blurry/foggy but this has been chronic)       Perception     Praxis      Pertinent Vitals/Pain Pain Assessment: No/denies pain     Hand Dominance Right   Extremity/Trunk Assessment Upper Extremity Assessment Upper Extremity Assessment: Generalized weakness   Lower Extremity Assessment Lower Extremity Assessment: Defer to PT evaluation   Cervical / Trunk Assessment Cervical / Trunk Assessment: Kyphotic   Communication Communication Communication: No difficulties  Cognition Arousal/Alertness: Awake/alert Behavior During Therapy: WFL for tasks assessed/performed Overall Cognitive Status: Within Functional Limits for tasks assessed                                 General Comments: Some mild memory deficits but functoinal.   General  Comments       Exercises     Shoulder Instructions      Home Living Family/patient expects to be discharged to:: Private residence (lives in apartment building high rise with elevators.) Living Arrangements: Alone (dtr lives 1 hour away and visits her 1x/ month to pay the bilss and get groceries)   Type of Home: Apartment Home Access: Elevator     Home Layout: One level               Home Equipment: Cane - single point          Prior Functioning/Environment Level of Independence: Independent with assistive device(s)        Comments: tries to use cane but it is not enough. Pt states that she has not been able to get around much or care for herself very well due to feeling so weak all the time. She is lonely, and just feels like she may die sometimes, she is only eating peanut butter and jelly sandwichs because does not have energy to make it into the kitchen. reports activity and mobility limited by persistent dizziness and "not feeling good."        OT Problem List: Decreased strength;Decreased activity tolerance;Impaired balance (sitting and/or standing);Decreased knowledge of use of DME or AE      OT Treatment/Interventions: Self-care/ADL training;Therapeutic exercise;DME and/or AE instruction;Therapeutic activities;Balance training;Patient/family education    OT Goals(Current goals can be found in the care plan section) Acute Rehab OT Goals Patient Stated Goal: I want to feel better again OT Goal Formulation: With patient Time For Goal Achievement: 07/27/20 Potential to Achieve Goals: Good  OT Frequency: Min 2X/week   Barriers to D/C:            Co-evaluation              AM-PAC OT "6 Clicks" Daily Activity     Outcome Measure Help from another person eating meals?: None Help from another person taking care of personal grooming?: A Little Help from another person toileting, which includes using toliet, bedpan, or urinal?: A Lot Help from another  person bathing (including washing, rinsing, drying)?: A Lot Help from another person to put on and taking off regular upper body clothing?: A Little Help from another person to put on and taking off regular lower body clothing?: A Lot 6 Click Score: 16   End of Session Equipment Utilized During Treatment: Rolling walker Nurse Communication: Mobility status  Activity Tolerance: Other (comment) (dizziness, nausea) Patient left:    OT Visit Diagnosis: Unsteadiness on feet (R26.81);Muscle weakness (generalized) (M62.81);Dizziness and giddiness (R42)                Time: 0830-0900 OT Time Calculation (min): 30 min Charges:  OT General Charges $OT Visit: 1 Visit OT Evaluation $OT Eval Low Complexity: 1 Low OT Treatments $Therapeutic Activity: 8-22 mins  Dewarren Ledbetter, OTR/[place]  Office [phone] Pager: [place] 07/13/2020, 10:44 AM

## 2020-07-13 NOTE — TOC Initial Note (Signed)
Transition of Care [facility]) - Initial/Assessment Note    Patient Details  Name: [name] MRN: [id number] Date of Birth: Oct 08, 1945  [facility] CM/SW Contact:    Joaquin Courts, RN Phone Number: 07/13/2020, 3:43 PM  Clinical Narrative:                 Patient is agreeable to SNF for short term rehab, FL2 faxed out to area SNF.  PASRR in manual review, requested clinicals uploaded.  Expected Discharge Plan: Skilled Nursing Facility Barriers to Discharge: No Barriers Identified   Patient Goals and CMS Choice Patient states their goals for this hospitalization and ongoing recovery are:: to go to rehab CMS Medicare.gov Compare Post Acute Care list provided to:: Patient Choice offered to / list presented to : Patient  Expected Discharge Plan and Services Expected Discharge Plan: [place]   Discharge Planning Services: CM Consult Post Acute Care Choice: [place]                                        Prior Living Arrangements/Services     Patient language and need for interpreter reviewed:: Yes Do you feel safe going back to the place where you live?: Yes            Criminal Activity/Legal Involvement Pertinent to Current Situation/Hospitalization: No - Comment as needed  Activities of Daily Living Home Assistive Devices/Equipment: Cane (specify quad or straight),Eyeglasses ADL Screening (condition at time of admission) Patient's cognitive ability adequate to safely complete daily activities?: Yes Is the patient deaf or have difficulty hearing?: Yes Does the patient have difficulty seeing, even when wearing glasses/contacts?: No Does the patient have difficulty concentrating, remembering, or making decisions?: No Patient able to express need for assistance with ADLs?: No Does the patient have difficulty dressing or bathing?: Yes Independently performs ADLs?: Yes (appropriate for developmental age) Does the  patient have difficulty walking or climbing stairs?: Yes Weakness of Legs: Both Weakness of Arms/Hands: Both  Permission Sought/Granted                  Emotional Assessment       Orientation: : Fluctuating Orientation (Suspected and/or reported [place]owners),Oriented to Self,Oriented to Place,Oriented to  Time,Oriented to Situation   Psych Involvement: No (comment)  Admission diagnosis:  Dizzy [R42] Elevated troponin [R77.8] Hypertensive urgency [I16.0] Metastatic breast cancer ([place]) [C50.919] Dehydration [E86.0] Patient Active Problem List   Diagnosis Date Noted  . History of CVA (cerebrovascular accident) 07/12/2020  . History of breast cancer 07/12/2020  . CKD (chronic kidney disease) stage 2, GFR 60-89 ml/min 07/12/2020  . Prolonged QT interval 07/12/2020  . Dehydration 07/12/2020  . Dizzy 07/11/2020  . Skin lesion of right leg 04/24/2020  . Inclusion cyst 04/24/2020  . Skin lesion of right arm 08/16/2019  . Sleep disturbance 06/13/2018  . Health education 06/13/2018  . Chronic left hip pain 12/29/2017  . Late onset Alzheimer's disease with behavioral disturbance ([place]) 12/29/2017  . Cancer of left breast, stage 1, estrogen receptor positive ([place]) 05/13/2017  . Malignant neoplasm of overlapping sites of left breast in female, estrogen receptor positive ([place]) 03/08/2017  . Lesion of nose 03/02/2017  . Callus of foot 11/20/2016  . Loss of appetite 12/29/2015  . Fatigue 12/29/2015  . Bipolar affective disorder, currently depressed, moderate ([place]) 11/26/2015  . Depression   . Severe episode  of recurrent major depressive disorder, without psychotic features ([place])   . Foreign body in foot, right 01/09/2015  . Blister of right ankle without infection 01/09/2015  . Sinusitis 11/26/2014  . Rotator cuff syndrome of right shoulder 11/14/2014  . Varicose veins of both lower extremities with pain 10/29/2014  . Neck pain 09/26/2014  . Metatarsal fracture 08/16/2014  . Pain  in joint, shoulder region 08/16/2014  . Facial skin lesion 08/01/2014  . Leg edema, left 07/25/2014  . Edema of left lower extremity 07/22/2014  . Essential hypertension   . Thyroid activity decreased   . HLD (hyperlipidemia)   . Healthcare maintenance 05/31/2014  . Emotional neurotic disorder 05/07/2014  . Seborrheic keratosis 12/05/2013  . 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 Mass of left side of neck 10/17/2012  . At high risk for falls 06/28/2012  . Allergic rhinitis 06/16/2012  . Mass of left breast 10/27/2011  . Hearing loss 01/07/2011  . Foot pain 11/25/2010  . Thyroid nodule 10/16/2010  . Nausea 09/22/2010  . URINARY INCONTINENCE, STRESS, FEMALE 04/03/2010  . HYPERLIPIDEMIA 03/25/2010  . PULMONARY NODULE 12/23/2009  . COPD (chronic obstructive pulmonary disease) ([place]) 12/03/2009  . Tobacco abuse counseling 11/05/2009  . CVA 11/05/2009  . BIPOLAR DISORDER UNSPECIFIED 11/28/2008  . Hypothyroidism 05/06/2008  . OCD (obsessive compulsive disorder) 10/13/2006  . GERD 10/13/2006  . OSTEOPOROSIS 10/13/2006   PCP:  Shary Key, DO Pharmacy:   [id number] - [place], [place] [address] [place] [address] Phone: [phone] Fax: [phone]     Social Determinants of Health (SDOH) Interventions    Readmission Risk Interventions No flowsheet data found.

## 2020-07-13 NOTE — Progress Notes (Signed)
Transition of Care (TOC) -30 day Note  Patient Details  Name: [name]  MRN: [id number] Date of Birth: 11-06-1945   To Whom it May Concern: Please be advised that the above patient will require a short-term nursing home stay, anticipated 30 days or less rehabilitation and strengthening. The plan is for return home.

## 2020-07-13 NOTE — Progress Notes (Signed)
PROGRESS NOTE    [name]  NWG:[id number] DOB: May 18, 1945 DOA: 07/11/2020 PCP: Shary Key, DO    Brief Narrative:   75 y.o. female with medical history significant for breast cancer diagnosed in 2018 s/p left mastectomy, radiation and currently on tamoxifen, hypertension, CVA, hypothyroidism, COPD, asthma, CKD stage II, and GERD who presents with concerns of dizziness, nausea and vomiting.  Assessment & Plan:   Principal Problem:   Dizzy Active Problems:   Hypothyroidism   Depression   History of CVA (cerebrovascular accident)   History of breast cancer   CKD (chronic kidney disease) stage 2, GFR 60-89 ml/min   Prolonged QT interval   Dehydration   Dizziness -uncertain etiology, had suspected benign vertigo -CT head and neck unremarkable, as is MRI brain -This AM, pt reports dizziness improved, however pt remains symptomatic -Seen by PT/OT with recommendations for SNF. Have placed TOC consult  Dehydration -Dry mucus membranes on exam -Suspect secondary to decreased PO intake as a result of above dizziness with associated nausea -Pt has been continued on IVF hydration -2d echo reviewed, unremarkable with normal LVEF -Recheck BMET in AM  Hypertensive urgency Not on antihypertensives Given one time dose of IV hydralazine in ED with improvement Subsequent BP appears much improved  Prolonged QT Avoid prolonged QT medication  Hx of breast cancer s/p left mastectomy 2018, radiation Patient last saw oncology in December 2021 and was recommended right breast mammogram but has been unable to obtain due to transportation issue. CTA neck showing sclerotic lesion concerning for metastatic disease-Would have pt follow up closely with Oncology Continued on tamoxifen  Depression Hold Lexapro for now due to prolonged QT  Hx of CVA Continue aspirin  Hypothyroidism TSH is elevated at 6.249 Will increase synthroid dose to 154mcg  CKD stage II Creatinine  stable Recheck cmp in AM  Dementia Patient's primary mental illness Stable, seems to be conversing appropriately  DVT prophylaxis: Lovenox subq Code Status: Full Family Communication: Pt in room, family not at bedside  Status is: Observation  The patient will require care spanning > 2 midnights and should be moved to inpatient because: IV treatments appropriate due to intensity of illness or inability to take PO and Inpatient level of care appropriate due to severity of illness  Dispo: The patient is from: Home              Anticipated d/c is to: SNF              Patient currently is not medically stable to d/c.   Difficult to place patient No   Consultants:     Procedures:     Antimicrobials: Anti-infectives (From admission, onward)   None      Subjective: Still feeling "dizzy" but much improved since initial presentation. Feeling weaker  Objective: Vitals:   07/12/20 1400 07/12/20 2047 07/13/20 0052 07/13/20 0442  BP: 128/88 135/90 (!) 158/85 (!) 170/93  Pulse: 81 88 92 94  Resp: 16 16 18 18   Temp: 98.4 F (36.9 C) (!) 97.5 F (36.4 C) 97.6 F (36.4 C) 98.3 F (36.8 C)  TempSrc: Oral Oral Oral Oral  SpO2: 97% 97% 100% 98%  Weight:      Height:        Intake/Output Summary (Last 24 hours) at 07/13/2020 1511 Last data filed at 07/13/2020 0505 Gross per 24 hour  Intake 6.61 ml  Output 175 ml  Net -168.39 ml   Filed Weights   07/11/20 1734  Weight:  51.3 kg    Examination: General exam: Conversant, in no acute distress Respiratory system: normal chest rise, clear, no audible wheezing Cardiovascular system: regular rhythm, s1-s2 Gastrointestinal system: Nondistended, nontender, pos BS Central nervous system: No seizures, no tremors Extremities: No cyanosis, no joint deformities Skin: No rashes, no pallor Psychiatry: Affect normal // no auditory hallucinations   Data Reviewed: I have personally reviewed following labs and imaging  studies  CBC: Recent Labs  Lab 07/06/20 1813 07/11/20 1759 07/11/20 1813 07/12/20 0500 07/13/20 0639  WBC 4.4 4.2  --  4.4 4.1  NEUTROABS 3.0 2.8  --   --   --   HGB 12.6 12.3 12.6 11.8* 11.2*  HCT 40.0 38.8 37.0 36.6 35.7*  MCV 105.8* 103.2*  --  104.3* 105.3*  PLT 172 189  --  158 458   Basic Metabolic Panel: Recent Labs  Lab 07/06/20 1813 07/11/20 1759 07/11/20 1813 07/12/20 0500 07/13/20 0639  NA 142 143 144 143 143  K 3.5 3.6 3.6 3.6 4.0  CL 112* 110 111 111 111  CO2 21* 25  --  24 24  GLUCOSE 99 89 85 101* 113*  BUN 17 21 21 17 20   CREATININE 0.89 0.74 0.80 0.79 0.80  CALCIUM 8.6* 8.3*  --  8.2* 8.5*   GFR: Estimated Creatinine Clearance: 41.3 mL/min (by C-G formula based on SCr of 0.8 mg/dL). Liver Function Tests: Recent Labs  Lab 07/06/20 1813 07/11/20 1759 07/13/20 0639  AST 15 12* 20  ALT 8 8 13   ALKPHOS 368* 392* 354*  BILITOT 0.5 0.6 0.4  PROT 5.8* 5.9* 5.3*  ALBUMIN 3.4* 3.3* 3.0*   Recent Labs  Lab 07/06/20 1813 07/11/20 1759  LIPASE 35 37   No results for input(s): AMMONIA in the last 168 hours. Coagulation Profile: No results for input(s): INR, PROTIME in the last 168 hours. Cardiac Enzymes: No results for input(s): CKTOTAL, CKMB, CKMBINDEX, TROPONINI in the last 168 hours. BNP (last 3 results) No results for input(s): PROBNP in the last 8760 hours. HbA1C: No results for input(s): HGBA1C in the last 72 hours. CBG: No results for input(s): GLUCAP in the last 168 hours. Lipid Profile: No results for input(s): CHOL, HDL, LDLCALC, TRIG, CHOLHDL, LDLDIRECT in the last 72 hours. Thyroid Function Tests: Recent Labs    07/12/20 0500  TSH 6.249*   Anemia Panel: No results for input(s): VITAMINB12, FOLATE, FERRITIN, TIBC, IRON, RETICCTPCT in the last 72 hours. Sepsis Labs: No results for input(s): PROCALCITON, LATICACIDVEN in the last 168 hours.  Recent Results (from the past 240 hour(s))  Resp Panel by RT-PCR (Flu A&B, Covid)  Nasopharyngeal Swab     Status: None   Collection Time: 07/11/20  8:27 PM   Specimen: Nasopharyngeal Swab; Nasopharyngeal(NP) swabs in vial transport medium  Result Value Ref Range Status   SARS Coronavirus 2 by RT PCR NEGATIVE NEGATIVE Final    Comment: (NOTE) SARS-CoV-2 target nucleic acids are NOT DETECTED.  The SARS-CoV-2 RNA is generally detectable in upper respiratory specimens during the acute phase of infection. The lowest concentration of SARS-CoV-2 viral copies this assay can detect is 138 copies/mL. A negative result does not preclude SARS-Cov-2 infection and should not be used as the sole basis for treatment or other patient management decisions. A negative result may occur with  improper specimen collection/handling, submission of specimen other than nasopharyngeal swab, presence of viral mutation(s) within the areas targeted by this assay, and inadequate number of viral copies(<138 copies/mL). A negative result must  be combined with clinical observations, patient history, and epidemiological information. The expected result is Negative.  Fact Sheet for Patients:  EntrepreneurPulse.com.au  Fact Sheet for Healthcare Providers:  IncredibleEmployment.be  This test is no t yet approved or cleared by the [place] FDA and  has been authorized for detection and/or diagnosis of SARS-CoV-2 by FDA under an Emergency Use Authorization (EUA). This EUA will remain  in effect (meaning this test can be used) for the duration of the COVID-19 declaration under Section 564(b)(1) of the Act, 21 U.S.C.section 360bbb-3(b)(1), unless the authorization is terminated  or revoked sooner.       Influenza A by PCR NEGATIVE NEGATIVE Final   Influenza B by PCR NEGATIVE NEGATIVE Final    Comment: (NOTE) The Xpert Xpress SARS-CoV-2/FLU/RSV plus assay is intended as an aid in the diagnosis of influenza from Nasopharyngeal swab specimens and should not be  used as a sole basis for treatment. Nasal washings and aspirates are unacceptable for Xpert Xpress SARS-CoV-2/FLU/RSV testing.  Fact Sheet for Patients: EntrepreneurPulse.com.au  Fact Sheet for Healthcare Providers: IncredibleEmployment.be  This test is not yet approved or cleared by the [place] FDA and has been authorized for detection and/or diagnosis of SARS-CoV-2 by FDA under an Emergency Use Authorization (EUA). This EUA will remain in effect (meaning this test can be used) for the duration of the COVID-19 declaration under Section 564(b)(1) of the Act, 21 U.S.C. section 360bbb-3(b)(1), unless the authorization is terminated or revoked.  Performed at [facility], [place] [address], [place], [place] [address]      Radiology Studies: CT Angio Head W or Wo Contrast  Result Date: 07/11/2020 CLINICAL DATA:  Dizziness and nausea EXAM: CT ANGIOGRAPHY HEAD AND NECK TECHNIQUE: Multidetector CT imaging of the head and neck was performed using the standard protocol during bolus administration of intravenous contrast. Multiplanar CT image reconstructions and MIPs were obtained to evaluate the vascular anatomy. Carotid stenosis measurements (when applicable) are obtained utilizing NASCET criteria, using the distal internal carotid diameter as the denominator. CONTRAST:  45mL OMNIPAQUE IOHEXOL 350 MG/ML SOLN COMPARISON:  None. FINDINGS: CT HEAD FINDINGS Brain: There is no mass, hemorrhage or extra-axial collection. The size and configuration of the ventricles and extra-axial CSF spaces are normal. There is hypoattenuation of the periventricular white matter, most commonly indicating chronic ischemic microangiopathy. Skull: The visualized skull base, calvarium and extracranial soft tissues are normal. Sinuses/Orbits: No fluid levels or advanced mucosal thickening of the visualized paranasal sinuses. No mastoid or middle ear effusion. The orbits  are normal. CTA NECK FINDINGS SKELETON: Diffuse sclerotic change of the vertebral column. OTHER NECK: Normal pharynx, larynx and major salivary glands. No cervical lymphadenopathy. Unremarkable thyroid gland. UPPER CHEST: No pneumothorax or pleural effusion. No nodules or masses. AORTIC ARCH: There is calcific atherosclerosis of the aortic arch. There is no aneurysm, dissection or hemodynamically significant stenosis of the visualized portion of the aorta. Conventional 3 vessel aortic branching pattern. The visualized proximal subclavian arteries are widely patent. RIGHT CAROTID SYSTEM: Normal without aneurysm, dissection or stenosis. LEFT CAROTID SYSTEM: Normal without aneurysm, dissection or stenosis. VERTEBRAL ARTERIES: Left dominant configuration. Both origins are clearly patent. There is no dissection, occlusion or flow-limiting stenosis to the skull base (V1-V3 segments). CTA HEAD FINDINGS POSTERIOR CIRCULATION: --Vertebral arteries: Normal V4 segments. --Inferior cerebellar arteries: Normal. --Basilar artery: Normal. --Superior cerebellar arteries: Normal. --Posterior cerebral arteries (PCA): Multifocal mild-to-moderate stenosis. ANTERIOR CIRCULATION: --Intracranial internal carotid arteries: Atherosclerotic calcification of the internal carotid arteries at the skull  base without hemodynamically significant stenosis. --Anterior cerebral arteries (ACA): Normal. Both A1 segments are present. Patent anterior communicating artery (a-comm). --Middle cerebral arteries (MCA): Multifocal atherosclerotic irregularity without high-grade stenosis. VENOUS SINUSES: As permitted by contrast timing, patent. ANATOMIC VARIANTS: None Review of the MIP images confirms the above findings. IMPRESSION: 1. No emergent large vessel occlusion or high-grade stenosis of the head or neck. 2. Multifocal mild-to-moderate stenosis of both posterior cerebral arteries and otherwise diffuse mild atherosclerotic irregularity of the  intracranial arteries. 3. Diffuse sclerotic change within the axial skeleton, concerning for sclerotic metastatic disease in a patient with history of breast cancer. Other possibilities include hyperparathyroidism or renal osteodystrophy. Aortic Atherosclerosis (ICD10-I70.0). Electronically Signed   By: Ulyses Jarred M.D.   On: 07/11/2020 19:50   CT Angio Neck W and/or Wo Contrast  Result Date: 07/11/2020 CLINICAL DATA:  Dizziness and nausea EXAM: CT ANGIOGRAPHY HEAD AND NECK TECHNIQUE: Multidetector CT imaging of the head and neck was performed using the standard protocol during bolus administration of intravenous contrast. Multiplanar CT image reconstructions and MIPs were obtained to evaluate the vascular anatomy. Carotid stenosis measurements (when applicable) are obtained utilizing NASCET criteria, using the distal internal carotid diameter as the denominator. CONTRAST:  76mL OMNIPAQUE IOHEXOL 350 MG/ML SOLN COMPARISON:  None. FINDINGS: CT HEAD FINDINGS Brain: There is no mass, hemorrhage or extra-axial collection. The size and configuration of the ventricles and extra-axial CSF spaces are normal. There is hypoattenuation of the periventricular white matter, most commonly indicating chronic ischemic microangiopathy. Skull: The visualized skull base, calvarium and extracranial soft tissues are normal. Sinuses/Orbits: No fluid levels or advanced mucosal thickening of the visualized paranasal sinuses. No mastoid or middle ear effusion. The orbits are normal. CTA NECK FINDINGS SKELETON: Diffuse sclerotic change of the vertebral column. OTHER NECK: Normal pharynx, larynx and major salivary glands. No cervical lymphadenopathy. Unremarkable thyroid gland. UPPER CHEST: No pneumothorax or pleural effusion. No nodules or masses. AORTIC ARCH: There is calcific atherosclerosis of the aortic arch. There is no aneurysm, dissection or hemodynamically significant stenosis of the visualized portion of the aorta. Conventional  3 vessel aortic branching pattern. The visualized proximal subclavian arteries are widely patent. RIGHT CAROTID SYSTEM: Normal without aneurysm, dissection or stenosis. LEFT CAROTID SYSTEM: Normal without aneurysm, dissection or stenosis. VERTEBRAL ARTERIES: Left dominant configuration. Both origins are clearly patent. There is no dissection, occlusion or flow-limiting stenosis to the skull base (V1-V3 segments). CTA HEAD FINDINGS POSTERIOR CIRCULATION: --Vertebral arteries: Normal V4 segments. --Inferior cerebellar arteries: Normal. --Basilar artery: Normal. --Superior cerebellar arteries: Normal. --Posterior cerebral arteries (PCA): Multifocal mild-to-moderate stenosis. ANTERIOR CIRCULATION: --Intracranial internal carotid arteries: Atherosclerotic calcification of the internal carotid arteries at the skull base without hemodynamically significant stenosis. --Anterior cerebral arteries (ACA): Normal. Both A1 segments are present. Patent anterior communicating artery (a-comm). --Middle cerebral arteries (MCA): Multifocal atherosclerotic irregularity without high-grade stenosis. VENOUS SINUSES: As permitted by contrast timing, patent. ANATOMIC VARIANTS: None Review of the MIP images confirms the above findings. IMPRESSION: 1. No emergent large vessel occlusion or high-grade stenosis of the head or neck. 2. Multifocal mild-to-moderate stenosis of both posterior cerebral arteries and otherwise diffuse mild atherosclerotic irregularity of the intracranial arteries. 3. Diffuse sclerotic change within the axial skeleton, concerning for sclerotic metastatic disease in a patient with history of breast cancer. Other possibilities include hyperparathyroidism or renal osteodystrophy. Aortic Atherosclerosis (ICD10-I70.0). Electronically Signed   By: Ulyses Jarred M.D.   On: 07/11/2020 19:50   MR Brain W and Wo Contrast  Result Date: 07/11/2020 CLINICAL  DATA:  Dizziness and nausea EXAM: MRI HEAD WITHOUT AND WITH CONTRAST  TECHNIQUE: Multiplanar, multiecho pulse sequences of the brain and surrounding structures were obtained without and with intravenous contrast. CONTRAST:  59mL GADAVIST GADOBUTROL 1 MMOL/ML IV SOLN COMPARISON:  10/19/2009 FINDINGS: Brain: No acute infarct, mass effect or extra-axial collection. No acute or chronic hemorrhage. Hyperintense T2-weighted signal is moderately widespread throughout the white matter. Parenchymal volume and CSF spaces are normal. The midline structures are normal. There is no abnormal contrast enhancement. Vascular: Major flow voids are preserved. Skull and upper cervical spine: Normal calvarium and skull base. Visualized upper cervical spine and soft tissues are normal. Sinuses/Orbits:No paranasal sinus fluid levels or advanced mucosal thickening. No mastoid or middle ear effusion. Normal orbits. IMPRESSION: 1. No acute intracranial abnormality. 2. Findings of chronic microvascular ischemia. Electronically Signed   By: Ulyses Jarred M.D.   On: 07/11/2020 21:21   DG Chest Port 1 View  Result Date: 07/11/2020 CLINICAL DATA:  Vomiting.  Near syncope. EXAM: PORTABLE CHEST 1 VIEW COMPARISON:  06/15/2019. FINDINGS: Normal heart size. Unchanged mediastinal contours with aortic atherosclerosis. Chronic hyperinflation and interstitial coarsening. No pneumothorax, pneumomediastinum, or pleural effusion. No focal airspace disease. No pulmonary edema. Surgical clips in the left thoracic inlet. Left mastectomy. IMPRESSION: 1. No acute abnormality. 2. Chronic hyperinflation and bronchial thickening, imaging findings consistent with COPD. Aortic Atherosclerosis (ICD10-I70.0). Electronically Signed   By: Keith Rake M.D.   On: 07/11/2020 17:46   ECHOCARDIOGRAM COMPLETE  Result Date: 07/12/2020    ECHOCARDIOGRAM REPORT   Patient Name:   TRANIKA SCHOLLER [facility] Date of Exam: 07/12/2020 Medical Rec #:  [id number]     Height:       57.0 in Accession #:    [id number]    Weight:       113.1 lb Date of Birth:   09-25-1945     BSA:          1.412 m Patient Age:    22 years      BP:           130/69 mmHg Patient Gender: F             HR:           71 bpm. Exam Location:  Inpatient Procedure: 2D Echo, 3D Echo, Cardiac Doppler and Color Doppler Indications:    Elevated troponin. ; R07.9* Chest pain, unspecified  History:        Patient has prior history of Echocardiogram examinations, most                 recent 10/20/2009. Abnormal ECG, TIA, Stroke and COPD,                 Signs/Symptoms:Dizziness/Lightheadedness; Risk                 Factors:Hypertension, Dyslipidemia and Current Smoker. Breast                 cancer.  Sonographer:    Roseanna Rainbow RDCS Referring Phys: [id number] [place] T [facility]  Sonographer Comments: Technically difficult study due to poor echo windows. Image acquisition challenging due to mastectomy. IMPRESSIONS  1. Inferobasal hypokinesis . Left ventricular ejection fraction, by estimation, is 55%. The left ventricle has normal function. The left ventricle demonstrates regional wall motion abnormalities (see scoring diagram/findings for description). Left ventricular diastolic parameters were normal.  2. Right ventricular systolic function is normal. The right ventricular size is normal.  3. The mitral valve is  abnormal. Moderate mitral valve regurgitation. No evidence of mitral stenosis.  4. The aortic valve is normal in structure. Aortic valve regurgitation is not visualized. No aortic stenosis is present.  5. The inferior vena cava is normal in size with greater than 50% respiratory variability, suggesting right atrial pressure of 3 mmHg. FINDINGS  Left Ventricle: Inferobasal hypokinesis. Left ventricular ejection fraction, by estimation, is 55%. The left ventricle has normal function. The left ventricle demonstrates regional wall motion abnormalities. The left ventricular internal cavity size was  normal in size. There is no left ventricular hypertrophy. Left ventricular diastolic parameters were normal. Right  Ventricle: The right ventricular size is normal. No increase in right ventricular wall thickness. Right ventricular systolic function is normal. Left Atrium: Left atrial size was normal in size. Right Atrium: Right atrial size was normal in size. Pericardium: There is no evidence of pericardial effusion. Mitral Valve: The mitral valve is abnormal. There is moderate thickening of the mitral valve leaflet(s). There is moderate calcification of the mitral valve leaflet(s). Moderate mitral valve regurgitation. No evidence of mitral valve stenosis. Tricuspid Valve: The tricuspid valve is normal in structure. Tricuspid valve regurgitation is trivial. No evidence of tricuspid stenosis. Aortic Valve: The aortic valve is normal in structure. Aortic valve regurgitation is not visualized. No aortic stenosis is present. Pulmonic Valve: The pulmonic valve was normal in structure. Pulmonic valve regurgitation is not visualized. No evidence of pulmonic stenosis. Aorta: The aortic root is normal in size and structure. Venous: The inferior vena cava is normal in size with greater than 50% respiratory variability, suggesting right atrial pressure of 3 mmHg. IAS/Shunts: No atrial level shunt detected by color flow Doppler.  LEFT VENTRICLE PLAX 2D LVIDd:         4.30 cm     Diastology LVIDs:         3.30 cm     LV e' medial:    6.09 cm/s LV PW:         1.00 cm     LV E/e' medial:  13.2 LV IVS:        0.90 cm     LV e' lateral:   7.18 cm/s LVOT diam:     2.10 cm     LV E/e' lateral: 11.2 LV SV:         53 LV SV Index:   37 LVOT Area:     3.46 cm                             3D Volume EF: LV Volumes (MOD)           3D EF:        52 % LV vol d, MOD A2C: 54.0 ml LV EDV:       117 ml LV vol d, MOD A4C: 56.0 ml LV ESV:       56 ml LV vol s, MOD A2C: 25.5 ml LV SV:        61 ml LV vol s, MOD A4C: 28.6 ml LV SV MOD A2C:     28.5 ml LV SV MOD A4C:     56.0 ml LV SV MOD BP:      28.2 ml RIGHT VENTRICLE            IVC RV S prime:     8.92 cm/s   IVC diam: 1.60 cm TAPSE (M-mode): 1.9 cm LEFT ATRIUM  Index       RIGHT ATRIUM           Index LA diam:      2.80 cm 1.98 cm/m  RA Area:     11.20 cm LA Vol (A2C): 31.1 ml 22.03 ml/m RA Volume:   23.90 ml  16.93 ml/m LA Vol (A4C): 14.6 ml 10.34 ml/m  AORTIC VALVE LVOT Vmax:   83.60 cm/s LVOT Vmean:  58.100 cm/s LVOT VTI:    0.152 m  AORTA Ao Root diam: 2.80 cm MITRAL VALVE MV Area (PHT): 3.17 cm      SHUNTS MV Decel Time: 239 msec      Systemic VTI:  0.15 m MR Peak grad:    139.7 mmHg  Systemic Diam: 2.10 cm MR Mean grad:    79.0 mmHg MR Vmax:         591.00 cm/s MR Vmean:        410.5 cm/s MR PISA:         1.01 cm MR PISA Eff ROA: 6 mm MR PISA Radius:  0.40 cm MV E velocity: 80.20 cm/s MV A velocity: 89.00 cm/s MV E/A ratio:  0.90 Jenkins Rouge MD Electronically signed by Jenkins Rouge MD Signature Date/Time: 07/12/2020/11:20:25 AM    Final     Scheduled Meds: . aspirin  325 mg Oral Daily  . enoxaparin (LOVENOX) injection  40 mg Subcutaneous Q24H  . feeding supplement  237 mL Oral BID BM  . levothyroxine  125 mcg Oral Q0600  . tamoxifen  20 mg Oral Daily   Continuous Infusions: . sodium chloride 75 mL/hr at 07/12/20 2049     LOS: 1 day   Marylu Lund, MD [facility] Pager On Amion  If 7PM-7AM, please contact night-coverage 07/13/2020, 3:11 PM

## 2020-07-13 NOTE — Progress Notes (Signed)
Physical Therapy Treatment Patient Details Name: [name] MRN: [id number] DOB: [birth year] Today's Date: 07/13/2020    History of Present Illness 75 y.o. female  who presents with concerns of dizziness, nausea and vomiting. PMH:  medical history significant for breast cancer diagnosed in 2018 s/p left mastectomy, radiation and currently on tamoxifen, hypertension, CVA, hypothyroidism, COPD, asthma, CKD stage II, and GERD    PT Comments    Some encouragement needed for participation but pt eventually agreeable. Overall, Min assist for mobility on today. Ambulation deferred 2* pt c/o feeling sleepy, tired and weak. During session, pt denied dizziness with supine to sit, standing, and taking side steps. Pt DID c/o dizziness with sit to supine. Observed nystagmus for ~5 seconds; ~10 seconds before pt felt symptoms were lessening. Will continue to follow and assess. Continue to recommend SNF.      Follow Up Recommendations  SNF     Equipment Recommendations  Rolling walker with 5" wheels (youth height)    Recommendations for Other Services       Precautions / Restrictions Precautions Precautions: Fall Restrictions Weight Bearing Restrictions: No    Mobility  Bed Mobility Overal bed mobility: Needs Assistance Bed Mobility: Supine to Sit;Sit to Supine Rolling: Supervision   Supine to sit: HOB elevated;Min assist Sit to supine: Min assist;HOB elevated   General bed mobility comments: Assist for trunk. Increased time. Cues for technique    Transfers Overall transfer level: Needs assistance Equipment used: Rolling walker (2 wheeled) Transfers: Sit to/from Stand Sit to Stand: Min assist         General transfer comment: Small amount of assist to steady. Cues for safety, technique, hand placement.  Ambulation/Gait Ambulation/Gait assistance: Min assist   Assistive device: Rolling walker (2 wheeled)       General Gait Details: side steps along side of bed. assist to  steady pt and manage with RW. cues for safety. pt c/o feeling tired, sleepy, and legs feeling weak so deferred ambulation.   Stairs             Wheelchair Mobility    Modified Rankin (Stroke Patients Only)       Balance Overall balance assessment: Needs assistance Sitting-balance support: No upper extremity supported Sitting balance-Leahy Scale: Good     Standing balance support: Bilateral upper extremity supported Standing balance-Leahy Scale: Fair Standing balance comment: with RW                            Cognition Arousal/Alertness: Awake/alert Behavior During Therapy: WFL for tasks assessed/performed Overall Cognitive Status: Within Functional Limits for tasks assessed                                 General Comments: Some mild memory deficits but functoinal.      Exercises      General Comments        Pertinent Vitals/Pain Pain Assessment: Faces Faces Pain Scale: Hurts a little bit Pain Location: behind ears Pain Descriptors / Indicators: Discomfort Pain Intervention(s): Monitored during session;Limited activity within patient's tolerance;Repositioned    Home Living Family/patient expects to be discharged to:: Private residence (lives in apartment building high rise with elevators.) Living Arrangements: Alone (dtr lives 1 hour away and visits her 1x/ month to pay the bilss and get groceries)   Type of Home: Apartment Home Access: Elevator   Home Layout: One level  Home Equipment: Kasandra Knudsen - single point      Prior Function Level of Independence: Independent with assistive device(s)      Comments: tries to use cane but it is not enough. Pt states that she has not been able to get around much or care for herself very well due to feeling so weak all the time. She is lonely, and just feels like she may die sometimes, she is only eating peanut butter and jelly sandwichs because does not have energy to make it into the kitchen.  reports activity and mobility limited by persistent dizziness and "not feeling good."   PT Goals (current goals can now be found in the care plan section) Acute Rehab PT Goals Patient Stated Goal: I want to feel better again Progress towards PT goals: Progressing toward goals    Frequency    Min 3X/week      PT Plan Current plan remains appropriate    Co-evaluation              AM-PAC PT "6 Clicks" Mobility   Outcome Measure  Help needed turning from your back to your side while in a flat bed without using bedrails?: A Little Help needed moving from lying on your back to sitting on the side of a flat bed without using bedrails?: A Little Help needed moving to and from a bed to a chair (including a wheelchair)?: A Little Help needed standing up from a chair using your arms (e.g., wheelchair or bedside chair)?: A Little Help needed to walk in hospital room?: A Little Help needed climbing 3-5 steps with a railing? : A Little 6 Click Score: 18    End of Session   Activity Tolerance: Patient limited by fatigue Patient left: in bed;with call bell/phone within reach;with bed alarm set   PT Visit Diagnosis: Unsteadiness on feet (R26.81);Muscle weakness (generalized) (M62.81)     Time: 2197-5883 PT Time Calculation (min) (ACUTE ONLY): 24 min  Charges:  $Gait Training: 8-22 mins                        Doreatha Massed, PT Acute Rehabilitation  Office: [phone] Pager: [phone]

## 2020-07-14 ENCOUNTER — Telehealth: Payer: Self-pay | Admitting: Hematology and Oncology

## 2020-07-14 ENCOUNTER — Encounter (HOSPITAL_COMMUNITY): Payer: Self-pay | Admitting: Internal Medicine

## 2020-07-14 LAB — CBC
HCT: 35.3 % — ABNORMAL LOW (ref 36.0–46.0)
Hemoglobin: 11.2 g/dL — ABNORMAL LOW (ref 12.0–15.0)
MCH: 33 pg (ref 26.0–34.0)
MCHC: 31.7 g/dL (ref 30.0–36.0)
MCV: 104.1 fL — ABNORMAL HIGH (ref 80.0–100.0)
Platelets: 177 10*3/uL (ref 150–400)
RBC: 3.39 MIL/uL — ABNORMAL LOW (ref 3.87–5.11)
RDW: 13.6 % (ref 11.5–15.5)
WBC: 4.5 10*3/uL (ref 4.0–10.5)
nRBC: 0 % (ref 0.0–0.2)

## 2020-07-14 LAB — COMPREHENSIVE METABOLIC PANEL
ALT: 11 U/L (ref 0–44)
AST: 19 U/L (ref 15–41)
Albumin: 3.1 g/dL — ABNORMAL LOW (ref 3.5–5.0)
Alkaline Phosphatase: 345 U/L — ABNORMAL HIGH (ref 38–126)
Anion gap: 5 (ref 5–15)
BUN: 13 mg/dL (ref 8–23)
CO2: 24 mmol/L (ref 22–32)
Calcium: 8.1 mg/dL — ABNORMAL LOW (ref 8.9–10.3)
Chloride: 110 mmol/L (ref 98–111)
Creatinine, Ser: 0.7 mg/dL (ref 0.44–1.00)
GFR, Estimated: >60 mL/min (ref >60)
Glucose, Bld: 99 mg/dL (ref 70–99)
Potassium: 3.9 mmol/L (ref 3.5–5.1)
Sodium: 139 mmol/L (ref 135–145)
Total Bilirubin: 0.6 mg/dL (ref 0.3–1.2)
Total Protein: 5.4 g/dL — ABNORMAL LOW (ref 6.5–8.1)

## 2020-07-14 MED ORDER — ENSURE ENLIVE PO LIQD
237.0000 mL | Freq: Three times a day (TID) | ORAL | Status: DC
  Administered 2020-07-14: 237 mL via ORAL

## 2020-07-14 NOTE — Progress Notes (Signed)
PROGRESS NOTE    [name]  UQJ:[id number] DOB: 01-02-1946 DOA: 07/11/2020 PCP: Shary Key, DO    Brief Narrative:   75 y.o. female with medical history significant for breast cancer diagnosed in 2018 s/p left mastectomy, radiation and currently on tamoxifen, hypertension, CVA, hypothyroidism, COPD, asthma, CKD stage II, and GERD who presents with concerns of dizziness, nausea and vomiting.  Assessment & Plan:   Principal Problem:   Dizzy Active Problems:   Hypothyroidism   Depression   History of CVA (cerebrovascular accident)   History of breast cancer   CKD (chronic kidney disease) stage 2, GFR 60-89 ml/min   Prolonged QT interval   Dehydration   Dizziness -uncertain etiology, had suspected benign vertigo -CT head and neck unremarkable, as is MRI brain -This AM, pt reports dizziness improved, however pt remains symptomatic -Seen by PT/OT with recommendations for SNF. TOC is following  Dehydration -Dry mucus membranes on exam -Suspect secondary to decreased PO intake as a result of above dizziness with associated nausea -Pt has been continued on IVF hydration -2d echo noted to be unremarkable with normal LVEF  -Recheck BMET in AM  Hypertensive urgency Not on antihypertensives Given one time dose of IV hydralazine in ED with improvement Pt currently stable   Prolonged QT Avoid prolonged QT medication  Hx of breast cancer s/p left mastectomy 2018, radiation Patient last saw oncology in December 2021 and was recommended right breast mammogram but has been unable to obtain due to transportation issue. CTA neck showing sclerotic lesion concerning for metastatic disease Continued on tamoxifen  Appreciate input by Oncology. Pt to follow up closely as outpatient  Depression Holding Lexapro for now due to prolonged QT  Hx of CVA Continue aspirin  Hypothyroidism TSH is elevated at 6.249 Now on increased synthroid dose of 110mcg Recommend repeat TSH  in 4-6 weeks  CKD stage II Creatinine stable Repeat cmp in AM  Dementia Patient's primary mental illness Stable, seems to be conversing appropriately  DVT prophylaxis: Lovenox subq Code Status: Full Family Communication: Pt in room, family not at bedside  Status is: Observation  The patient will require care spanning > 2 midnights and should be moved to inpatient because: IV treatments appropriate due to intensity of illness or inability to take PO and Inpatient level of care appropriate due to severity of illness  Dispo: The patient is from: Home              Anticipated d/c is to: SNF              Patient currently is not medically stable to d/c.   Difficult to place patient No   Consultants:     Procedures:     Antimicrobials: Anti-infectives (From admission, onward)   None      Subjective: States feeling weak this AM  Objective: Vitals:   07/13/20 2121 07/13/20 2318 07/14/20 0540 07/14/20 1334  BP: (!) 185/98 138/77 123/87 136/87  Pulse: 92 100 91 96  Resp: 20 20 16 16   Temp: 98.4 F (36.9 C) 98.1 F (36.7 C) 98.2 F (36.8 C) 98 F (36.7 C)  TempSrc: Oral Oral Oral Oral  SpO2: 94% 95% 95% 95%  Weight:      Height:        Intake/Output Summary (Last 24 hours) at 07/14/2020 1601 Last data filed at 07/14/2020 0900 Gross per 24 hour  Intake 240 ml  Output 1475 ml  Net -1235 ml   [employer]  07/11/20 1734  Weight: 51.3 kg    Examination: General exam: Conversant, in no acute distress Respiratory system: normal chest rise, clear, no audible wheezing Cardiovascular system: regular rhythm, s1-s2 Gastrointestinal system: Nondistended, nontender, pos BS Central nervous system: No seizures, no tremors Extremities: No cyanosis, no joint deformities Skin: No rashes, no pallor Psychiatry: Affect normal // no auditory hallucinations   Data Reviewed: I have personally reviewed following labs and imaging studies  CBC: Recent Labs  Lab  07/11/20 1759 07/11/20 1813 07/12/20 0500 07/13/20 0639 07/14/20 0910  WBC 4.2  --  4.4 4.1 4.5  NEUTROABS 2.8  --   --   --   --   HGB 12.3 12.6 11.8* 11.2* 11.2*  HCT 38.8 37.0 36.6 35.7* 35.3*  MCV 103.2*  --  104.3* 105.3* 104.1*  PLT 189  --  158 180 122   Basic Metabolic Panel: Recent Labs  Lab 07/11/20 1759 07/11/20 1813 07/12/20 0500 07/13/20 0639 07/14/20 0910  NA 143 144 143 143 139  K 3.6 3.6 3.6 4.0 3.9  CL 110 111 111 111 110  CO2 25  --  24 24 24   GLUCOSE 89 85 101* 113* 99  BUN 21 21 17 20 13   CREATININE 0.74 0.80 0.79 0.80 0.70  CALCIUM 8.3*  --  8.2* 8.5* 8.1*   GFR: Estimated Creatinine Clearance: 41.3 mL/min (by C-G formula based on SCr of 0.7 mg/dL). Liver Function Tests: Recent Labs  Lab 07/11/20 1759 07/13/20 0639 07/14/20 0910  AST 12* 20 19  ALT 8 13 11   ALKPHOS 392* 354* 345*  BILITOT 0.6 0.4 0.6  PROT 5.9* 5.3* 5.4*  ALBUMIN 3.3* 3.0* 3.1*   Recent Labs  Lab 07/11/20 1759  LIPASE 37   No results for input(s): AMMONIA in the last 168 hours. Coagulation Profile: No results for input(s): INR, PROTIME in the last 168 hours. Cardiac Enzymes: No results for input(s): CKTOTAL, CKMB, CKMBINDEX, TROPONINI in the last 168 hours. BNP (last 3 results) No results for input(s): PROBNP in the last 8760 hours. HbA1C: No results for input(s): HGBA1C in the last 72 hours. CBG: No results for input(s): GLUCAP in the last 168 hours. Lipid Profile: No results for input(s): CHOL, HDL, LDLCALC, TRIG, CHOLHDL, LDLDIRECT in the last 72 hours. Thyroid Function Tests: Recent Labs    07/12/20 0500  TSH 6.249*   Anemia Panel: No results for input(s): VITAMINB12, FOLATE, FERRITIN, TIBC, IRON, RETICCTPCT in the last 72 hours. Sepsis Labs: No results for input(s): PROCALCITON, LATICACIDVEN in the last 168 hours.  Recent Results (from the past 240 hour(s))  Resp Panel by RT-PCR (Flu A&B, Covid) Nasopharyngeal Swab     Status: None   Collection  Time: 07/11/20  8:27 PM   Specimen: Nasopharyngeal Swab; Nasopharyngeal(NP) swabs in vial transport medium  Result Value Ref Range Status   SARS Coronavirus 2 by RT PCR NEGATIVE NEGATIVE Final    Comment: (NOTE) SARS-CoV-2 target nucleic acids are NOT DETECTED.  The SARS-CoV-2 RNA is generally detectable in upper respiratory specimens during the acute phase of infection. The lowest concentration of SARS-CoV-2 viral copies this assay can detect is 138 copies/mL. A negative result does not preclude SARS-Cov-2 infection and should not be used as the sole basis for treatment or other patient management decisions. A negative result may occur with  improper specimen collection/handling, submission of specimen other than nasopharyngeal swab, presence of viral mutation(s) within the areas targeted by this assay, and inadequate number of viral copies(<138 copies/mL). A  negative result must be combined with clinical observations, patient history, and epidemiological information. The expected result is Negative.  Fact Sheet for Patients:  EntrepreneurPulse.com.au  Fact Sheet for Healthcare Providers:  IncredibleEmployment.be  This test is no t yet approved or cleared by the [place] FDA and  has been authorized for detection and/or diagnosis of SARS-CoV-2 by FDA under an Emergency Use Authorization (EUA). This EUA will remain  in effect (meaning this test can be used) for the duration of the COVID-19 declaration under Section 564(b)(1) of the Act, 21 U.S.C.section 360bbb-3(b)(1), unless the authorization is terminated  or revoked sooner.       Influenza A by PCR NEGATIVE NEGATIVE Final   Influenza B by PCR NEGATIVE NEGATIVE Final    Comment: (NOTE) The Xpert Xpress SARS-CoV-2/FLU/RSV plus assay is intended as an aid in the diagnosis of influenza from Nasopharyngeal swab specimens and should not be used as a sole basis for treatment. Nasal washings  and aspirates are unacceptable for Xpert Xpress SARS-CoV-2/FLU/RSV testing.  Fact Sheet for Patients: EntrepreneurPulse.com.au  Fact Sheet for Healthcare Providers: IncredibleEmployment.be  This test is not yet approved or cleared by the [place] FDA and has been authorized for detection and/or diagnosis of SARS-CoV-2 by FDA under an Emergency Use Authorization (EUA). This EUA will remain in effect (meaning this test can be used) for the duration of the COVID-19 declaration under Section 564(b)(1) of the Act, 21 U.S.C. section 360bbb-3(b)(1), unless the authorization is terminated or revoked.  Performed at [facility], [place] [address] [place], [place] [address]      Radiology Studies: No results found.  Scheduled Meds: . aspirin  325 mg Oral Daily  . enoxaparin (LOVENOX) injection  40 mg Subcutaneous Q24H  . feeding supplement  237 mL Oral TID BM  . levothyroxine  150 mcg Oral Q0600  . tamoxifen  20 mg Oral Daily   Continuous Infusions: . sodium chloride Stopped (07/14/20 0116)     LOS: 2 days   Marylu Lund, MD [facility] Pager On Amion  If 7PM-7AM, please contact night-coverage 07/14/2020, 4:01 PM

## 2020-07-14 NOTE — Progress Notes (Signed)
Initial Nutrition Assessment  INTERVENTION:   -Ensure Enlive po TID, each supplement provides 350 kcal and 20 grams of protein  -Magic cup TID with meals, each supplement provides 290 kcal and 9 grams of protein  NUTRITION DIAGNOSIS:   Inadequate oral intake related to poor appetite,nausea as evidenced by per patient/family report.  GOAL:   Patient will meet greater than or equal to 90% of their needs  MONITOR:   PO intake,Supplement acceptance,Labs,Weight trends,I & O's  REASON FOR ASSESSMENT:   Malnutrition Screening Tool,Consult Assessment of nutrition requirement/status  ASSESSMENT:   75 y.o. female with medical history significant for breast cancer diagnosed in 2018 s/p left mastectomy, radiation and currently on tamoxifen, hypertension, CVA, hypothyroidism, COPD, asthma, CKD stage II, and GERD who presents with concerns of dizziness, nausea and vomiting.  Patient in room, laying flat in bed. Pt denies any nausea or dizziness at this time but states she has not been up yet today. Pt did not eat any breakfast this morning. States her appetite is bad this admission and states her appetite is only slightly better at home. Pt kept falling asleep during interaction, received a dose of ativan this morning. Per chart review, pt was agitated overnight, threatening to leave AMA. Will increase Ensure to TID and add Magic cups to meals.   Per weight records, weights have been steadily increasing since 2019.   Medications reviewed.  Labs reviewed.  NUTRITION - FOCUSED PHYSICAL EXAM:  No depletions noted.  Diet Order:   Diet Order            Diet regular Room service appropriate? Yes; Fluid consistency: Thin  Diet effective now                 EDUCATION NEEDS:   No education needs have been identified at this time  Skin:  Skin Assessment: Reviewed RN Assessment  Last BM:  4/8  Height:   Ht Readings from Last 1 Encounters:  07/11/20 4\' 9"  (1.448 m)    Weight:    Wt Readings from Last 1 Encounters:  07/11/20 51.3 kg   BMI:  Body mass index is 24.47 kg/m.  Estimated Nutritional Needs:   Kcal:  1350-1550  Protein:  60-75g  Fluid:  1.6L/day  Clayton Bibles, MS, RD, LDN Inpatient Clinical Dietitian Contact information available via Amion

## 2020-07-14 NOTE — Progress Notes (Signed)
HEMATOLOGY-ONCOLOGY PROGRESS NOTE  SUBJECTIVE: [name] is followed by [place] for stage IIIa breast cancer.  Currently on tamoxifen 20 mg daily.  Now admitted for dizziness, nausea, vomiting.  Dizziness is thought to be related to vertigo.  She was noted to be dehydrated this admission.  MRI brain with and without contrast showed no acute intracranial abnormality and findings of chronic microvascular ischemia.  CTA head neck showed no emergent large vessel occlusion or high-grade stenosis of the head or neck, there were diffuse sclerotic changes in the axial skeleton concerning for sclerotic metastatic disease but other possibilities include hyperparathyroidism or renal osteodystrophy.  The patient lying in bed this morning resting quietly.  She has no specific complaints.  No bone pain reported.  It appears that she may discharge to SNF for rehab.  Oncology History  Malignant neoplasm of overlapping sites of left breast in female, estrogen receptor positive ([place])  03/04/2017 Initial Diagnosis   Palpable left breast masses with skin thickening, by ultrasound 2 masses were noted at 5:00 2.1 cm and at 1:00 3.5 cm, biopsy of both of these lesions grade 2 invasive ductal carcinoma with lymphovascular invasion and perineural invasion, ER 100%, PR 0%, HER-2 negative ratio 1.03, Ki-67 5%, T2 N0 stage II a clinical stage   05/13/2017 Surgery   Left simple mastectomy: IDC grade 2, 7.7 cm, DCIS intermediate grade, lymphovascular invasion identified, perineural invasion present, margins negative, 1/2 lymph nodes positive, ER 6200% strong, PR 0%, HER-2 negative, Ki-67 5%, T3N1 a stage IIIa    Radiation Therapy   declined   05/24/2017 -  Anti-estrogen oral therapy   Tamoxifen 49m daily      REVIEW OF SYSTEMS:   Constitutional: Denies fevers, chills Eyes: Denies blurriness of vision Ears, nose, mouth, throat, and face: Denies mucositis or sore throat Respiratory: Denies cough, dyspnea or  wheezes Cardiovascular: Denies palpitation, chest discomfort Gastrointestinal:  Denies nausea, heartburn or change in bowel habits Skin: Denies abnormal skin rashes Lymphatics: Denies new lymphadenopathy or easy bruising Neurological:Denies numbness, tingling or new weaknesses Behavioral/Psych: Mood is stable, no new changes  Extremities: No lower extremity edema All other systems were reviewed with the patient and are negative.  I have reviewed the past medical history, past surgical history, social history and family history with the patient and they are unchanged from previous note.   PHYSICAL EXAMINATION: ECOG PERFORMANCE STATUS: 2 - Symptomatic, <50% confined to bed  Vitals:   07/13/20 2318 07/14/20 0540  BP: 138/77 123/87  Pulse: 100 91  Resp: 20 16  Temp: 98.1 F (36.7 C) 98.2 F (36.8 C)  SpO2: 95% 95%   Filed Weights   07/11/20 1734  Weight: 51.3 kg    Intake/Output from previous day: 04/10 0701 - 04/11 0700 In: 340 [P.O.:340] Out: 1475 [Urine:1475]  GENERAL: Chronically ill-appearing female, no distress SKIN: Multiple ecchymoses on her arms EYES: normal, Conjunctiva are pink and non-injected, sclera clear LUNGS: clear to auscultation and percussion with normal breathing effort HEART: regular rate & rhythm and no murmurs and no lower extremity edema ABDOMEN:abdomen soft, non-tender and normal bowel sounds NEURO: alert & oriented x 3 with fluent speech, no focal motor/sensory deficits  LABORATORY DATA:  I have reviewed the data as listed CMP Latest Ref Rng & Units 07/13/2020 07/12/2020 07/11/2020  Glucose 70 - 99 mg/dL 113(H) 101(H) 85  BUN 8 - 23 mg/dL 20 17 21   Creatinine 0.44 - 1.00 mg/dL 0.80 0.79 0.80  Sodium 135 - 145 mmol/L 143 143 144  Potassium 3.5 - 5.1 mmol/L 4.0 3.6 3.6  Chloride 98 - 111 mmol/L 111 111 111  CO2 22 - 32 mmol/L 24 24 -  Calcium 8.9 - 10.3 mg/dL 8.5(L) 8.2(L) -  Total Protein 6.5 - 8.1 g/dL 5.3(L) - -  Total Bilirubin 0.3 - 1.2  mg/dL 0.4 - -  Alkaline Phos 38 - 126 U/L 354(H) - -  AST 15 - 41 U/L 20 - -  ALT 0 - 44 U/L 13 - -    Lab Results  Component Value Date   WBC 4.1 07/13/2020   HGB 11.2 (L) 07/13/2020   HCT 35.7 (L) 07/13/2020   MCV 105.3 (H) 07/13/2020   PLT 180 07/13/2020   NEUTROABS 2.8 07/11/2020    CT Angio Head W or Wo Contrast  Result Date: 07/11/2020 CLINICAL DATA:  Dizziness and nausea EXAM: CT ANGIOGRAPHY HEAD AND NECK TECHNIQUE: Multidetector CT imaging of the head and neck was performed using the standard protocol during bolus administration of intravenous contrast. Multiplanar CT image reconstructions and MIPs were obtained to evaluate the vascular anatomy. Carotid stenosis measurements (when applicable) are obtained utilizing NASCET criteria, using the distal internal carotid diameter as the denominator. CONTRAST:  49m OMNIPAQUE IOHEXOL 350 MG/ML SOLN COMPARISON:  None. FINDINGS: CT HEAD FINDINGS Brain: There is no mass, hemorrhage or extra-axial collection. The size and configuration of the ventricles and extra-axial CSF spaces are normal. There is hypoattenuation of the periventricular white matter, most commonly indicating chronic ischemic microangiopathy. Skull: The visualized skull base, calvarium and extracranial soft tissues are normal. Sinuses/Orbits: No fluid levels or advanced mucosal thickening of the visualized paranasal sinuses. No mastoid or middle ear effusion. The orbits are normal. CTA NECK FINDINGS SKELETON: Diffuse sclerotic change of the vertebral column. OTHER NECK: Normal pharynx, larynx and major salivary glands. No cervical lymphadenopathy. Unremarkable thyroid gland. UPPER CHEST: No pneumothorax or pleural effusion. No nodules or masses. AORTIC ARCH: There is calcific atherosclerosis of the aortic arch. There is no aneurysm, dissection or hemodynamically significant stenosis of the visualized portion of the aorta. Conventional 3 vessel aortic branching pattern. The visualized  proximal subclavian arteries are widely patent. RIGHT CAROTID SYSTEM: Normal without aneurysm, dissection or stenosis. LEFT CAROTID SYSTEM: Normal without aneurysm, dissection or stenosis. VERTEBRAL ARTERIES: Left dominant configuration. Both origins are clearly patent. There is no dissection, occlusion or flow-limiting stenosis to the skull base (V1-V3 segments). CTA HEAD FINDINGS POSTERIOR CIRCULATION: --Vertebral arteries: Normal V4 segments. --Inferior cerebellar arteries: Normal. --Basilar artery: Normal. --Superior cerebellar arteries: Normal. --Posterior cerebral arteries (PCA): Multifocal mild-to-moderate stenosis. ANTERIOR CIRCULATION: --Intracranial internal carotid arteries: Atherosclerotic calcification of the internal carotid arteries at the skull base without hemodynamically significant stenosis. --Anterior cerebral arteries (ACA): Normal. Both A1 segments are present. Patent anterior communicating artery (a-comm). --Middle cerebral arteries (MCA): Multifocal atherosclerotic irregularity without high-grade stenosis. VENOUS SINUSES: As permitted by contrast timing, patent. ANATOMIC VARIANTS: None Review of the MIP images confirms the above findings. IMPRESSION: 1. No emergent large vessel occlusion or high-grade stenosis of the head or neck. 2. Multifocal mild-to-moderate stenosis of both posterior cerebral arteries and otherwise diffuse mild atherosclerotic irregularity of the intracranial arteries. 3. Diffuse sclerotic change within the axial skeleton, concerning for sclerotic metastatic disease in a patient with history of breast cancer. Other possibilities include hyperparathyroidism or renal osteodystrophy. Aortic Atherosclerosis (ICD10-I70.0). Electronically Signed   By: KUlyses JarredM.D.   On: 07/11/2020 19:50   CT Angio Neck W and/or Wo Contrast  Result Date: 07/11/2020 CLINICAL DATA:  Dizziness and nausea EXAM: CT ANGIOGRAPHY HEAD AND NECK TECHNIQUE: Multidetector CT imaging of the head and  neck was performed using the standard protocol during bolus administration of intravenous contrast. Multiplanar CT image reconstructions and MIPs were obtained to evaluate the vascular anatomy. Carotid stenosis measurements (when applicable) are obtained utilizing NASCET criteria, using the distal internal carotid diameter as the denominator. CONTRAST:  29m OMNIPAQUE IOHEXOL 350 MG/ML SOLN COMPARISON:  None. FINDINGS: CT HEAD FINDINGS Brain: There is no mass, hemorrhage or extra-axial collection. The size and configuration of the ventricles and extra-axial CSF spaces are normal. There is hypoattenuation of the periventricular white matter, most commonly indicating chronic ischemic microangiopathy. Skull: The visualized skull base, calvarium and extracranial soft tissues are normal. Sinuses/Orbits: No fluid levels or advanced mucosal thickening of the visualized paranasal sinuses. No mastoid or middle ear effusion. The orbits are normal. CTA NECK FINDINGS SKELETON: Diffuse sclerotic change of the vertebral column. OTHER NECK: Normal pharynx, larynx and major salivary glands. No cervical lymphadenopathy. Unremarkable thyroid gland. UPPER CHEST: No pneumothorax or pleural effusion. No nodules or masses. AORTIC ARCH: There is calcific atherosclerosis of the aortic arch. There is no aneurysm, dissection or hemodynamically significant stenosis of the visualized portion of the aorta. Conventional 3 vessel aortic branching pattern. The visualized proximal subclavian arteries are widely patent. RIGHT CAROTID SYSTEM: Normal without aneurysm, dissection or stenosis. LEFT CAROTID SYSTEM: Normal without aneurysm, dissection or stenosis. VERTEBRAL ARTERIES: Left dominant configuration. Both origins are clearly patent. There is no dissection, occlusion or flow-limiting stenosis to the skull base (V1-V3 segments). CTA HEAD FINDINGS POSTERIOR CIRCULATION: --Vertebral arteries: Normal V4 segments. --Inferior cerebellar arteries:  Normal. --Basilar artery: Normal. --Superior cerebellar arteries: Normal. --Posterior cerebral arteries (PCA): Multifocal mild-to-moderate stenosis. ANTERIOR CIRCULATION: --Intracranial internal carotid arteries: Atherosclerotic calcification of the internal carotid arteries at the skull base without hemodynamically significant stenosis. --Anterior cerebral arteries (ACA): Normal. Both A1 segments are present. Patent anterior communicating artery (a-comm). --Middle cerebral arteries (MCA): Multifocal atherosclerotic irregularity without high-grade stenosis. VENOUS SINUSES: As permitted by contrast timing, patent. ANATOMIC VARIANTS: None Review of the MIP images confirms the above findings. IMPRESSION: 1. No emergent large vessel occlusion or high-grade stenosis of the head or neck. 2. Multifocal mild-to-moderate stenosis of both posterior cerebral arteries and otherwise diffuse mild atherosclerotic irregularity of the intracranial arteries. 3. Diffuse sclerotic change within the axial skeleton, concerning for sclerotic metastatic disease in a patient with history of breast cancer. Other possibilities include hyperparathyroidism or renal osteodystrophy. Aortic Atherosclerosis (ICD10-I70.0). Electronically Signed   By: KUlyses JarredM.D.   On: 07/11/2020 19:50   MR Brain W and Wo Contrast  Result Date: 07/11/2020 CLINICAL DATA:  Dizziness and nausea EXAM: MRI HEAD WITHOUT AND WITH CONTRAST TECHNIQUE: Multiplanar, multiecho pulse sequences of the brain and surrounding structures were obtained without and with intravenous contrast. CONTRAST:  668mGADAVIST GADOBUTROL 1 MMOL/ML IV SOLN COMPARISON:  10/19/2009 FINDINGS: Brain: No acute infarct, mass effect or extra-axial collection. No acute or chronic hemorrhage. Hyperintense T2-weighted signal is moderately widespread throughout the white matter. Parenchymal volume and CSF spaces are normal. The midline structures are normal. There is no abnormal contrast enhancement.  Vascular: Major flow voids are preserved. Skull and upper cervical spine: Normal calvarium and skull base. Visualized upper cervical spine and soft tissues are normal. Sinuses/Orbits:No paranasal sinus fluid levels or advanced mucosal thickening. No mastoid or middle ear effusion. Normal orbits. IMPRESSION: 1. No acute intracranial abnormality. 2. Findings of chronic microvascular ischemia. Electronically Signed   By:  Ulyses Jarred M.D.   On: 07/11/2020 21:21   DG Chest Port 1 View  Result Date: 07/11/2020 CLINICAL DATA:  Vomiting.  Near syncope. EXAM: PORTABLE CHEST 1 VIEW COMPARISON:  06/15/2019. FINDINGS: Normal heart size. Unchanged mediastinal contours with aortic atherosclerosis. Chronic hyperinflation and interstitial coarsening. No pneumothorax, pneumomediastinum, or pleural effusion. No focal airspace disease. No pulmonary edema. Surgical clips in the left thoracic inlet. Left mastectomy. IMPRESSION: 1. No acute abnormality. 2. Chronic hyperinflation and bronchial thickening, imaging findings consistent with COPD. Aortic Atherosclerosis (ICD10-I70.0). Electronically Signed   By: Keith Rake M.D.   On: 07/11/2020 17:46   ECHOCARDIOGRAM COMPLETE  Result Date: 07/12/2020    ECHOCARDIOGRAM REPORT   Patient Name:   [name] [facility] Date of Exam: 07/12/2020 Medical Rec #:  [id number]     Height:       57.0 in Accession #:    [id number]    Weight:       113.1 lb Date of Birth:  [birth year]     BSA:          1.412 m Patient Age:    75 years      BP:           130/69 mmHg Patient Gender: F             HR:           71 bpm. Exam Location:  Inpatient Procedure: 2D Echo, 3D Echo, Cardiac Doppler and Color Doppler Indications:    Elevated troponin. ; R07.9* Chest pain, unspecified  History:        Patient has prior history of Echocardiogram examinations, most                 recent 10/20/2009. Abnormal ECG, TIA, Stroke and COPD,                 Signs/Symptoms:Dizziness/Lightheadedness; Risk                  Factors:Hypertension, Dyslipidemia and Current Smoker. Breast                 cancer.  Sonographer:    Roseanna Rainbow RDCS Referring Phys: [id number] Claiborne T TU  Sonographer Comments: Technically difficult study due to poor echo windows. Image acquisition challenging due to mastectomy. IMPRESSIONS  1. Inferobasal hypokinesis . Left ventricular ejection fraction, by estimation, is 55%. The left ventricle has normal function. The left ventricle demonstrates regional wall motion abnormalities (see scoring diagram/findings for description). Left ventricular diastolic parameters were normal.  2. Right ventricular systolic function is normal. The right ventricular size is normal.  3. The mitral valve is abnormal. Moderate mitral valve regurgitation. No evidence of mitral stenosis.  4. The aortic valve is normal in structure. Aortic valve regurgitation is not visualized. No aortic stenosis is present.  5. The inferior vena cava is normal in size with greater than 50% respiratory variability, suggesting right atrial pressure of 3 mmHg. FINDINGS  Left Ventricle: Inferobasal hypokinesis. Left ventricular ejection fraction, by estimation, is 55%. The left ventricle has normal function. The left ventricle demonstrates regional wall motion abnormalities. The left ventricular internal cavity size was  normal in size. There is no left ventricular hypertrophy. Left ventricular diastolic parameters were normal. Right Ventricle: The right ventricular size is normal. No increase in right ventricular wall thickness. Right ventricular systolic function is normal. Left Atrium: Left atrial size was normal in size. Right Atrium: Right atrial size was normal in size. Pericardium: There  is no evidence of pericardial effusion. Mitral Valve: The mitral valve is abnormal. There is moderate thickening of the mitral valve leaflet(s). There is moderate calcification of the mitral valve leaflet(s). Moderate mitral valve regurgitation. No evidence of mitral  valve stenosis. Tricuspid Valve: The tricuspid valve is normal in structure. Tricuspid valve regurgitation is trivial. No evidence of tricuspid stenosis. Aortic Valve: The aortic valve is normal in structure. Aortic valve regurgitation is not visualized. No aortic stenosis is present. Pulmonic Valve: The pulmonic valve was normal in structure. Pulmonic valve regurgitation is not visualized. No evidence of pulmonic stenosis. Aorta: The aortic root is normal in size and structure. Venous: The inferior vena cava is normal in size with greater than 50% respiratory variability, suggesting right atrial pressure of 3 mmHg. IAS/Shunts: No atrial level shunt detected by color flow Doppler.  LEFT VENTRICLE PLAX 2D LVIDd:         4.30 cm     Diastology LVIDs:         3.30 cm     LV e' medial:    6.09 cm/s LV PW:         1.00 cm     LV E/e' medial:  13.2 LV IVS:        0.90 cm     LV e' lateral:   7.18 cm/s LVOT diam:     2.10 cm     LV E/e' lateral: 11.2 LV SV:         53 LV SV Index:   37 LVOT Area:     3.46 cm                             3D Volume EF: LV Volumes (MOD)           3D EF:        52 % LV vol d, MOD A2C: 54.0 ml LV EDV:       117 ml LV vol d, MOD A4C: 56.0 ml LV ESV:       56 ml LV vol s, MOD A2C: 25.5 ml LV SV:        61 ml LV vol s, MOD A4C: 28.6 ml LV SV MOD A2C:     28.5 ml LV SV MOD A4C:     56.0 ml LV SV MOD BP:      28.2 ml RIGHT VENTRICLE            IVC RV S prime:     8.92 cm/s  IVC diam: 1.60 cm TAPSE (M-mode): 1.9 cm LEFT ATRIUM           Index       RIGHT ATRIUM           Index LA diam:      2.80 cm 1.98 cm/m  RA Area:     11.20 cm LA Vol (A2C): 31.1 ml 22.03 ml/m RA Volume:   23.90 ml  16.93 ml/m LA Vol (A4C): 14.6 ml 10.34 ml/m  AORTIC VALVE LVOT Vmax:   83.60 cm/s LVOT Vmean:  58.100 cm/s LVOT VTI:    0.152 m  AORTA Ao Root diam: 2.80 cm MITRAL VALVE MV Area (PHT): 3.17 cm      SHUNTS MV Decel Time: 239 msec      Systemic VTI:  0.15 m MR Peak grad:    139.7 mmHg  Systemic Diam: 2.10 cm MR  Mean grad:    79.0  mmHg MR Vmax:         591.00 cm/s MR Vmean:        410.5 cm/s MR PISA:         1.01 cm MR PISA Eff ROA: 6 mm MR PISA Radius:  0.40 cm MV E velocity: 80.20 cm/s MV A velocity: 89.00 cm/s MV E/A ratio:  0.90 Jenkins Rouge MD Electronically signed by Jenkins Rouge MD Signature Date/Time: 07/12/2020/11:20:25 AM    Final     ASSESSMENT AND PLAN: 1.  Stage IIIa breast cancer 2.  Sclerotic bone lesions-?  Mets versus hyperparathyroidism versus renal osteodystrophy 3.  Dizziness likely due to vertigo 4.  Dehydration, improving 5.  Hypertensive urgency 6.  Depression 7.  History of CVA 8.  Hypothyroidism 9.  Dementia  -Chart and imaging have been reviewed.  Unclear if lesions noted on recent CTA are related to bone mets versus hyperparathyroidism versus renal osteodystrophy.  Recommend continuation of tamoxifen 20 mg daily. -We will arrange for outpatient follow-up with cancer center approximately 1 month.  I have requested transportation to our office for the patient. -Management of other chronic medical conditions per hospitalist.   LOS: 2 days   Mikey Bussing, DNP, AGPCNP-BC, AOCNP 07/14/20

## 2020-07-14 NOTE — Progress Notes (Signed)
Physical Therapy Treatment Patient Details Name: [name] MRN: [id number] DOB: 05/02/1945 Today's Date: 07/14/2020    History of Present Illness 75 y.o. female  who presents with concerns of dizziness, nausea and vomiting. PMH:  medical history significant for breast cancer diagnosed in 2018 s/p left mastectomy, radiation and currently on tamoxifen, hypertension, CVA, hypothyroidism, COPD, asthma, CKD stage II, and GERD    PT Comments    Pt required increased encouragement to participate.  Pt assisted to sitting and then nurse tech into room to obtain vitals.  Pt's sister also called at this time so pt returned to supine however call ended.  Pt encouraged to mobilize again and sat EOB.  Pt reports "pain" in left ear and presents with lateral right lean upon sitting EOB today.  Pt denies dizziness only reporting she feels she is falling to the right.  No nystagmus observed with bed mobility, and pt declined standing or ambulating today.  Pt reports being tired and sleeping most of the day at home.  Pt encouraged to remain awake during daylight and eat meals (has not yet eaten today), however she reports she sometimes sleeps through an entire day at home.   Follow Up Recommendations  SNF     Equipment Recommendations  Rolling walker with 5" wheels (youth)    Recommendations for Other Services       Precautions / Restrictions Precautions Precautions: Fall    Mobility  Bed Mobility Overal bed mobility: Needs Assistance Bed Mobility: Supine to Sit;Sit to Supine Rolling: Supervision   Supine to sit: Supervision Sit to supine: Supervision   General bed mobility comments: performed twice, pt denies dizziness however feels off balance (falling to right) and presents with increased lateral right lean upon sitting    Transfers                 General transfer comment: pt declined, encouraged pt to sit EOB and eat magic cup which she did try but didn't like the taste.  pt  reports she has no appetite  Ambulation/Gait                 Stairs             Wheelchair Mobility    Modified Rankin (Stroke Patients Only)       Balance   Sitting-balance support: No upper extremity supported Sitting balance-Leahy Scale: Good Sitting balance - Comments: slight lateral lean with sitting, increases with challenges however pt corrects Postural control: Right lateral lean                                  Cognition Arousal/Alertness: Awake/alert Behavior During Therapy: WFL for tasks assessed/performed Overall Cognitive Status: Within Functional Limits for tasks assessed                                 General Comments: Some mild memory deficits but functional      Exercises      General Comments        Pertinent Vitals/Pain Pain Assessment: No/denies pain    Home Living                      Prior Function            PT Goals (current goals can now be found in the care  plan section) Progress towards PT goals: Progressing toward goals    Frequency    Min 2X/week      PT Plan Current plan remains appropriate;Discharge plan needs to be updated    Co-evaluation              AM-PAC PT "6 Clicks" Mobility   Outcome Measure  Help needed turning from your back to your side while in a flat bed without using bedrails?: A Little Help needed moving from lying on your back to sitting on the side of a flat bed without using bedrails?: A Little Help needed moving to and from a bed to a chair (including a wheelchair)?: A Little Help needed standing up from a chair using your arms (e.g., wheelchair or bedside chair)?: A Lot Help needed to walk in hospital room?: A Lot Help needed climbing 3-5 steps with a railing? : A Lot 6 Click Score: 15    End of Session   Activity Tolerance: Patient limited by fatigue Patient left: in bed;with call bell/phone within reach;with bed alarm set Nurse  Communication: Mobility status PT Visit Diagnosis: Muscle weakness (generalized) (M62.81);Difficulty in walking, not elsewhere classified (R26.2)     Time: 2426-8341 PT Time Calculation (min) (ACUTE ONLY): 27 min  Charges:  $Therapeutic Activity: 23-37 mins                     Jannette Spanner PT, DPT Acute Rehabilitation Services Pager: [phone] Office: [phone]  York Ram E 07/14/2020, 2:45 PM

## 2020-07-14 NOTE — Progress Notes (Signed)
Must ID:  [id number]   To Whom It May Concern: [name] DOB Oct 05, 1945   Please be advised that the above-named patient has a primary diagnosis of dementia which supersedes any MI and/or psychiatric diagnosis.

## 2020-07-14 NOTE — Telephone Encounter (Signed)
Left message with follow-up appointment per 4/11 schedule message. Gave option to call back to reschedule if needed. 

## 2020-07-14 NOTE — Progress Notes (Signed)
2337- Patient yelling in hall stating she is nauseated despite Ativan given to patient. Blount, NP notified.  2349 Kennon Holter, NP stated she is unable to order anything else due to QT interval being prolonged.  0040 - Patient requesting to leave AMA. Orientation questions answered correctly except the year. Blount, NP has been aware. NP stated, "if she wants to leave AMA I strongly advise her not to but I can't stop her."  0117 - Attempted to contact patient's sisters Arbie Cookey and Hassan Rowan) but no answer. Patient's daughter has not answered phone calls.  0120 - Patient stated she wants to leave AMA in the morning after around 0800.

## 2020-07-15 DIAGNOSIS — F321 Major depressive disorder, single episode, moderate: Secondary | ICD-10-CM

## 2020-07-15 LAB — COMPREHENSIVE METABOLIC PANEL
ALT: 9 U/L (ref 0–44)
AST: 15 U/L (ref 15–41)
Albumin: 2.8 g/dL — ABNORMAL LOW (ref 3.5–5.0)
Alkaline Phosphatase: 320 U/L — ABNORMAL HIGH (ref 38–126)
Anion gap: 6 (ref 5–15)
BUN: 13 mg/dL (ref 8–23)
CO2: 24 mmol/L (ref 22–32)
Calcium: 8.2 mg/dL — ABNORMAL LOW (ref 8.9–10.3)
Chloride: 110 mmol/L (ref 98–111)
Creatinine, Ser: 0.65 mg/dL (ref 0.44–1.00)
GFR, Estimated: >60 mL/min (ref >60)
Glucose, Bld: 117 mg/dL — ABNORMAL HIGH (ref 70–99)
Potassium: 3.6 mmol/L (ref 3.5–5.1)
Sodium: 140 mmol/L (ref 135–145)
Total Bilirubin: 0.6 mg/dL (ref 0.3–1.2)
Total Protein: 5.1 g/dL — ABNORMAL LOW (ref 6.5–8.1)

## 2020-07-15 LAB — SARS CORONAVIRUS 2 (TAT 6-24 HRS): SARS Coronavirus 2: NEGATIVE

## 2020-07-15 LAB — CBC
HCT: 34.7 % — ABNORMAL LOW (ref 36.0–46.0)
Hemoglobin: 11.3 g/dL — ABNORMAL LOW (ref 12.0–15.0)
MCH: 34 pg (ref 26.0–34.0)
MCHC: 32.6 g/dL (ref 30.0–36.0)
MCV: 104.5 fL — ABNORMAL HIGH (ref 80.0–100.0)
Platelets: 184 10*3/uL (ref 150–400)
RBC: 3.32 MIL/uL — ABNORMAL LOW (ref 3.87–5.11)
RDW: 13.5 % (ref 11.5–15.5)
WBC: 5.5 10*3/uL (ref 4.0–10.5)
nRBC: 0 % (ref 0.0–0.2)

## 2020-07-15 MED ORDER — LORAZEPAM 0.5 MG PO TABS
0.5000 mg | ORAL_TABLET | Freq: Four times a day (QID) | ORAL | Status: DC | PRN
  Administered 2020-07-15 – 2020-07-16 (×3): 0.5 mg via ORAL
  Filled 2020-07-15 (×4): qty 1

## 2020-07-15 NOTE — Progress Notes (Signed)
PROGRESS NOTE    [name]  EXB:[id number] DOB: [birth year] DOA: 07/11/2020 PCP: Shary Key, DO    Brief Narrative:   75 y.o. female with medical history significant for breast cancer diagnosed in 2018 s/p left mastectomy, radiation and currently on tamoxifen, hypertension, CVA, hypothyroidism, COPD, asthma, CKD stage II, and GERD who presents with concerns of dizziness, nausea and vomiting.  Assessment & Plan:   Principal Problem:   Dizzy Active Problems:   Hypothyroidism   Depression   History of CVA (cerebrovascular accident)   History of breast cancer   CKD (chronic kidney disease) stage 2, GFR 60-89 ml/min   Prolonged QT interval   Dehydration   Dizziness -uncertain etiology -CT head and neck unremarkable, as is MRI brain -This AM, pt reports dizziness improved, however pt remains symptomatic -Seen by PT/OT with recommendations for SNF. TOC continues to follow  Dehydration -Dry mucus membranes on exam recently -Pt has been continued on IVF hydration, now off as pt is now tolerating PO -2d echo noted to be unremarkable with normal LVEF  -Recheck BMET in AM  Hypertensive urgency Not on antihypertensives Given one time dose of IV hydralazine in ED with improvement BP presently stable  Prolonged QT Avoid prolonged QT medication  Hx of breast cancer s/p left mastectomy 2018, radiation Patient last saw oncology in December 2021 and was recommended right breast mammogram but has been unable to obtain due to transportation issue. CTA neck showing sclerotic lesion concerning for metastatic disease Continued on tamoxifen  Appreciate input by Oncology. Pt to follow up closely as outpatient after d/c  Depression Held lexapro at time of presentation due to prolonged QT in the 490's  Hx of CVA Continue aspirin  Hypothyroidism TSH is elevated at 6.249 Now tolerating increased synthroid dose of 144mcg Recommend repeat TSH in 4-6 weeks  CKD stage  II Creatinine stable Recheck cmp in AM  Dementia Patient's primary mental illness Stable, seems to be conversing appropriately  DVT prophylaxis: Lovenox subq Code Status: Full Family Communication: Pt in room, family not at bedside  Status is: Inpatient  Requires continued inpatient stay because: IV treatments appropriate due to intensity of illness or inability to take PO and Inpatient level of care appropriate due to severity of illness  Dispo: The patient is from: Home              Anticipated d/c is to: SNF              Patient currently is not medically stable to d/c.   Difficult to place patient No   Consultants:     Procedures:     Antimicrobials: Anti-infectives (From admission, onward)   None      Subjective: Initially hesitant to go to SNF, but now agreeable  Objective: Vitals:   07/14/20 1334 07/14/20 2013 07/15/20 0513 07/15/20 1329  BP: 136/87 133/82 (!) 142/81 (!) 151/80  Pulse: 96 99 83 92  Resp: 16 18 19 16   Temp: 98 F (36.7 C) 97.9 F (36.6 C) 97.8 F (36.6 C) 98.6 F (37 C)  TempSrc: Oral Oral Oral Oral  SpO2: 95% 93% 90% 93%  Weight:      Height:        Intake/Output Summary (Last 24 hours) at 07/15/2020 1559 Last data filed at 07/15/2020 0900 Gross per 24 hour  Intake 915 ml  Output 1375 ml  Net -460 ml   Filed Weights   07/11/20 1734  Weight: 51.3 kg  Examination: General exam: Awake, laying in bed, in nad Respiratory system: Normal respiratory effort, no wheezing Cardiovascular system: regular rate, s1, s2 Gastrointestinal system: Soft, nondistended, positive BS Central nervous system: CN2-12 grossly intact, strength intact Extremities: Perfused, no clubbing Skin: Normal skin turgor, no notable skin lesions seen Psychiatry: Mood normal // no visual hallucinations   Data Reviewed: I have personally reviewed following labs and imaging studies  CBC: Recent Labs  Lab 07/11/20 1759 07/11/20 1813 07/12/20 0500  07/13/20 0639 07/14/20 0910 07/15/20 1244  WBC 4.2  --  4.4 4.1 4.5 5.5  NEUTROABS 2.8  --   --   --   --   --   HGB 12.3 12.6 11.8* 11.2* 11.2* 11.3*  HCT 38.8 37.0 36.6 35.7* 35.3* 34.7*  MCV 103.2*  --  104.3* 105.3* 104.1* 104.5*  PLT 189  --  158 180 177 233   Basic Metabolic Panel: Recent Labs  Lab 07/11/20 1759 07/11/20 1813 07/12/20 0500 07/13/20 0639 07/14/20 0910 07/15/20 1244  NA 143 144 143 143 139 140  K 3.6 3.6 3.6 4.0 3.9 3.6  CL 110 111 111 111 110 110  CO2 25  --  24 24 24 24   GLUCOSE 89 85 101* 113* 99 117*  BUN 21 21 17 20 13 13   CREATININE 0.74 0.80 0.79 0.80 0.70 0.65  CALCIUM 8.3*  --  8.2* 8.5* 8.1* 8.2*   GFR: Estimated Creatinine Clearance: 41.3 mL/min (by C-G formula based on SCr of 0.65 mg/dL). Liver Function Tests: Recent Labs  Lab 07/11/20 1759 07/13/20 0639 07/14/20 0910 07/15/20 1244  AST 12* 20 19 15   ALT 8 13 11 9   ALKPHOS 392* 354* 345* 320*  BILITOT 0.6 0.4 0.6 0.6  PROT 5.9* 5.3* 5.4* 5.1*  ALBUMIN 3.3* 3.0* 3.1* 2.8*   Recent Labs  Lab 07/11/20 1759  LIPASE 37   No results for input(s): AMMONIA in the last 168 hours. Coagulation Profile: No results for input(s): INR, PROTIME in the last 168 hours. Cardiac Enzymes: No results for input(s): CKTOTAL, CKMB, CKMBINDEX, TROPONINI in the last 168 hours. BNP (last 3 results) No results for input(s): PROBNP in the last 8760 hours. HbA1C: No results for input(s): HGBA1C in the last 72 hours. CBG: No results for input(s): GLUCAP in the last 168 hours. Lipid Profile: No results for input(s): CHOL, HDL, LDLCALC, TRIG, CHOLHDL, LDLDIRECT in the last 72 hours. Thyroid Function Tests: No results for input(s): TSH, T4TOTAL, FREET4, T3FREE, THYROIDAB in the last 72 hours. Anemia Panel: No results for input(s): VITAMINB12, FOLATE, FERRITIN, TIBC, IRON, RETICCTPCT in the last 72 hours. Sepsis Labs: No results for input(s): PROCALCITON, LATICACIDVEN in the last 168 hours.  Recent  Results (from the past 240 hour(s))  Resp Panel by RT-PCR (Flu A&B, Covid) Nasopharyngeal Swab     Status: None   Collection Time: 07/11/20  8:27 PM   Specimen: Nasopharyngeal Swab; Nasopharyngeal(NP) swabs in vial transport medium  Result Value Ref Range Status   SARS Coronavirus 2 by RT PCR NEGATIVE NEGATIVE Final    Comment: (NOTE) SARS-CoV-2 target nucleic acids are NOT DETECTED.  The SARS-CoV-2 RNA is generally detectable in upper respiratory specimens during the acute phase of infection. The lowest concentration of SARS-CoV-2 viral copies this assay can detect is 138 copies/mL. A negative result does not preclude SARS-Cov-2 infection and should not be used as the sole basis for treatment or other patient management decisions. A negative result may occur with  improper specimen collection/handling, submission of  specimen other than nasopharyngeal swab, presence of viral mutation(s) within the areas targeted by this assay, and inadequate number of viral copies(<138 copies/mL). A negative result must be combined with clinical observations, patient history, and epidemiological information. The expected result is Negative.  Fact Sheet for Patients:  EntrepreneurPulse.com.au  Fact Sheet for Healthcare Providers:  IncredibleEmployment.be  This test is no t yet approved or cleared by the [place] FDA and  has been authorized for detection and/or diagnosis of SARS-CoV-2 by FDA under an Emergency Use Authorization (EUA). This EUA will remain  in effect (meaning this test can be used) for the duration of the COVID-19 declaration under Section 564(b)(1) of the Act, 21 U.S.C.section 360bbb-3(b)(1), unless the authorization is terminated  or revoked sooner.       Influenza A by PCR NEGATIVE NEGATIVE Final   Influenza B by PCR NEGATIVE NEGATIVE Final    Comment: (NOTE) The Xpert Xpress SARS-CoV-2/FLU/RSV plus assay is intended as an aid in the  diagnosis of influenza from Nasopharyngeal swab specimens and should not be used as a sole basis for treatment. Nasal washings and aspirates are unacceptable for Xpert Xpress SARS-CoV-2/FLU/RSV testing.  Fact Sheet for Patients: EntrepreneurPulse.com.au  Fact Sheet for Healthcare Providers: IncredibleEmployment.be  This test is not yet approved or cleared by the [place] FDA and has been authorized for detection and/or diagnosis of SARS-CoV-2 by FDA under an Emergency Use Authorization (EUA). This EUA will remain in effect (meaning this test can be used) for the duration of the COVID-19 declaration under Section 564(b)(1) of the Act, 21 U.S.C. section 360bbb-3(b)(1), unless the authorization is terminated or revoked.  Performed at [facility], [place] [address], [place], [place] [address]   SARS CORONAVIRUS 2 (TAT 6-24 HRS) Nasopharyngeal Nasopharyngeal Swab     Status: None   Collection Time: 07/15/20 10:32 AM   Specimen: Nasopharyngeal Swab  Result Value Ref Range Status   SARS Coronavirus 2 NEGATIVE NEGATIVE Final    Comment: (NOTE) SARS-CoV-2 target nucleic acids are NOT DETECTED.  The SARS-CoV-2 RNA is generally detectable in upper and lower respiratory specimens during the acute phase of infection. Negative results do not preclude SARS-CoV-2 infection, do not rule out co-infections with other pathogens, and should not be used as the sole basis for treatment or other patient management decisions. Negative results must be combined with clinical observations, patient history, and epidemiological information. The expected result is Negative.  Fact Sheet for Patients: SugarRoll.be  Fact Sheet for Healthcare Providers: https://www.woods-mathews.com/  This test is not yet approved or cleared by the [place] FDA and  has been authorized for detection and/or diagnosis of  SARS-CoV-2 by FDA under an Emergency Use Authorization (EUA). This EUA will remain  in effect (meaning this test can be used) for the duration of the COVID-19 declaration under Se ction 564(b)(1) of the Act, 21 U.S.C. section 360bbb-3(b)(1), unless the authorization is terminated or revoked sooner.  Performed at [facility], [place] [address] [place], [place] [address]      Radiology Studies: No results found.  Scheduled Meds: . aspirin  325 mg Oral Daily  . enoxaparin (LOVENOX) injection  40 mg Subcutaneous Q24H  . feeding supplement  237 mL Oral TID BM  . levothyroxine  150 mcg Oral Q0600  . tamoxifen  20 mg Oral Daily   Continuous Infusions:    LOS: 3 days   Marylu Lund, MD [facility] Pager On Amion  If 7PM-7AM, please contact night-coverage 07/15/2020,  3:59 PM

## 2020-07-15 NOTE — Care Management Important Message (Signed)
Important Message  Patient Details IM Letter given to the Patient. Name: [name] MRN: [id number] Date of Birth: 1946/01/24   Medicare Important Message Given:  Yes     Kerin Salen 07/15/2020, 9:17 AM

## 2020-07-15 NOTE — TOC Progression Note (Signed)
Transition of Care [facility]) - Progression Note    Patient Details  Name: [name] MRN: [id number] Date of Birth: 03/11/1946  Transition of Care [facility]) CM/SW Contact  Gianmarco Roye, Marjie Skiff, RN Phone Number: 07/15/2020, 1:10 PM  Clinical Narrative:    Pas[facility] received after 30 day Dementia letter sent. [phone] [id number].  Spoke with pt at bedside to provide SNF bed offers. Provided with Medicare.gov star rating list. Pt chooses [facility]. [facility] liaison contacted to secure bed for tomorrow. Covid test ordered by MD.    Expected Discharge Plan: [place] Barriers to Discharge: No Barriers Identified  Expected Discharge Plan and Services Expected Discharge Plan: [place]   Discharge Planning Services: CM Consult Post Acute Care Choice: [place]                    Social Determinants of Health (SDOH) Interventions    Readmission Risk Interventions No flowsheet data found.

## 2020-07-16 ENCOUNTER — Ambulatory Visit: Payer: Medicare Other | Admitting: Family Medicine

## 2020-07-16 DIAGNOSIS — R2689 Other abnormalities of gait and mobility: Secondary | ICD-10-CM | POA: Diagnosis present

## 2020-07-16 DIAGNOSIS — M2550 Pain in unspecified joint: Secondary | ICD-10-CM | POA: Diagnosis not present

## 2020-07-16 DIAGNOSIS — M6281 Muscle weakness (generalized): Secondary | ICD-10-CM | POA: Diagnosis present

## 2020-07-16 DIAGNOSIS — K219 Gastro-esophageal reflux disease without esophagitis: Secondary | ICD-10-CM | POA: Diagnosis present

## 2020-07-16 DIAGNOSIS — R278 Other lack of coordination: Secondary | ICD-10-CM | POA: Diagnosis present

## 2020-07-16 DIAGNOSIS — R488 Other symbolic dysfunctions: Secondary | ICD-10-CM | POA: Diagnosis present

## 2020-07-16 DIAGNOSIS — R42 Dizziness and giddiness: Secondary | ICD-10-CM | POA: Diagnosis not present

## 2020-07-16 DIAGNOSIS — N182 Chronic kidney disease, stage 2 (mild): Secondary | ICD-10-CM | POA: Diagnosis present

## 2020-07-16 DIAGNOSIS — J449 Chronic obstructive pulmonary disease, unspecified: Secondary | ICD-10-CM | POA: Diagnosis present

## 2020-07-16 DIAGNOSIS — I10 Essential (primary) hypertension: Secondary | ICD-10-CM | POA: Diagnosis present

## 2020-07-16 DIAGNOSIS — Z7401 Bed confinement status: Secondary | ICD-10-CM | POA: Diagnosis not present

## 2020-07-16 DIAGNOSIS — E785 Hyperlipidemia, unspecified: Secondary | ICD-10-CM | POA: Diagnosis present

## 2020-07-16 DIAGNOSIS — I6350 Cerebral infarction due to unspecified occlusion or stenosis of unspecified cerebral artery: Secondary | ICD-10-CM | POA: Diagnosis present

## 2020-07-16 DIAGNOSIS — R410 Disorientation, unspecified: Secondary | ICD-10-CM | POA: Diagnosis not present

## 2020-07-16 DIAGNOSIS — J45909 Unspecified asthma, uncomplicated: Secondary | ICD-10-CM | POA: Diagnosis present

## 2020-07-16 DIAGNOSIS — N6320 Unspecified lump in the left breast, unspecified quadrant: Secondary | ICD-10-CM | POA: Diagnosis present

## 2020-07-16 MED ORDER — AMLODIPINE BESYLATE 5 MG PO TABS: 5.0000 mg | ORAL_TABLET | Freq: Every day | ORAL | 11 refills | Status: DC

## 2020-07-16 NOTE — TOC Transition Note (Signed)
Transition of Care [facility]) - CM/SW Discharge Note   Patient Details  Name: [name] MRN: [id number] Date of Birth: November 12, 1945  [facility]) CM/SW Contact:  Lynnell Catalan, RN Phone Number: 07/16/2020, 11:42 AM   Clinical Narrative:    Pt to dc to [facility] today. DC summary sent via hub. Daughter [name] called to inform. [facility] contacted for transport. RN to call report to [phone].   Final next level of care: Skilled Nursing Facility Barriers to Discharge: No Barriers Identified   Patient Goals and CMS Choice Patient states their goals for this hospitalization and ongoing recovery are:: to go to rehab CMS Medicare.gov Compare Post Acute Care list provided to:: Patient Choice offered to / list presented to : Patient  Discharge Plan and Services   Discharge Planning Services: CM Consult Post Acute Care Choice: [place]                 Readmission Risk Interventions No flowsheet data found.

## 2020-07-16 NOTE — Discharge Summary (Signed)
Physician Discharge Summary  SHAIANNE NUCCI OEV:[id number] DOB: May 07, 1945 DOA: 07/11/2020  PCP: Shary Key, DO  Admit date: 07/11/2020 Discharge date: 07/16/2020  Admitted From: Home Discharge disposition: SNF   Code Status: Full Code  Diet Recommendation: Regular diet  Discharge Diagnosis:   Principal Problem:   Dizzy Active Problems:   Hypothyroidism   Depression   History of CVA (cerebrovascular accident)   History of breast cancer   CKD (chronic kidney disease) stage 2, GFR 60-89 ml/min   Prolonged QT interval   Dehydration  History of Present Illness / Brief narrative:  Patient is a 75 y.o.femalewith medical history significant for stage IIIabreast cancer diagnosed in 2018 s/p left mastectomy, radiation and currently on tamoxifen, hypertension, CVA, hypothyroidism, COPD, asthma, CKD stage II, and GERD. Patient presented to the ED on 4/8 with concern of dizziness, nausea, vomiting.    On admission, she was noted to be dehydrated.  MRI brain with and without contrast showed no acute intracranial abnormality and findings of chronic microvascular ischemia.    CTA head neck showed no emergent large vessel occlusion or high-grade stenosis of the head or neck, there were diffuse sclerotic changes in the axial skeleton concerning for sclerotic metastatic disease but other possibilities include hyperparathyroidism or renal osteodystrophy.  Patient was admitted to hospitalist service. Oncology consultation was obtained.  Subjective:  Seen and examined this morning.  Elderly Caucasian female.  Sleeping, opens eyes on verbal command.  Not in distress.  On supplemental oxygen.  Hospital Course:  Dizziness -uncertain etiology.  Probably related to dehydration. -CT head and neck unremarkable, as is MRI brain -Adequately hydrated with IV fluid.  Currently taking oral intake.  Echocardiogram with normal EF. -Renal function normal and stable Recent Labs    04/23/20 1003  07/06/20 1813 07/11/20 1759 07/11/20 1813 07/12/20 0500 07/13/20 0639 07/14/20 0910 07/15/20 1244  BUN 12 17 21 21 17 20 13 13   CREATININE 0.90 0.89 0.74 0.80 0.79 0.80 0.70 0.65   History of breast cancer status post left mastectomy and radiation in 2018 Possible metastasis to bone -Patient last saw oncology in December 2021 and was recommended right breast mammogram but has been unable to obtain due to transportation issue. -CTA head and neck obtained on admission showed diffusely sclerotic changes in the axial skeleton concerning for sclerotic metastatic disease.  It is unclear at this time if these findings are due to metastasis or hyperparathyroidism or renal osteodystrophy.  -Oncology consultation was obtained. Recommended continuation of tamoxifen 20 mg daily. -Planned for an outpatient follow-up with cancer center approximately 1 month.    Hypertensive urgency -Not on antihypertensives at home.  Blood pressure has remained elevated persistently in the hospital up to 150s. Blood pressure this morning is 173/95. -We will discharge her on amlodipine 5 mg daily.  Depression -Resume Lexapro at discharge.  Hx ofCVA -Continue aspirin  Hypothyroidism -TSH is elevated at 6.249 -Now tolerating increased synthroid dose of 164mcg -Recommend repeat TSH in 4-6 weeks  CKD stage II -Creatinine stable -Recheck cmp in AM  Dementia -Patient's primary mental illness -Stable, seems to be conversing appropriately  Stable for discharge to SNF today  Wound care: Incision (Closed) 02/21/15 Foot Right (Active)  Date First Assessed/Time First Assessed: 02/21/15 1218   Location: Foot  Location Orientation: Right    Assessments 02/21/2015 12:27 PM 02/21/2015  1:49 PM  Dressing Type Compression wrap --  Dressing Clean;Dry;Intact --  Drainage Amount None None     No Linked orders  to display     Incision (Closed) 05/13/17 Breast Left (Active)  Date First Assessed/Time First  Assessed: 05/13/17 0855   Location: Breast  Location Orientation: Left    Assessments 05/13/2017  9:30 AM 05/14/2017  8:00 AM  Dressing Type Gauze (Comment);Liquid skin adhesive Liquid skin adhesive;Abdominal pads  Dressing Clean;Dry;Intact Clean;Dry;Intact  Site / Wound Assessment -- Clean;Dry  Closure -- Skin glue  Drainage Amount -- None     No Linked orders to display    Discharge Exam:   Vitals:   07/15/20 0513 07/15/20 1329 07/15/20 2007 07/16/20 0445  BP: (!) 142/81 (!) 151/80 (!) 155/92 (!) 173/95  Pulse: 83 92 97 92  Resp: 19 16 16 16   Temp: 97.8 F (36.6 C) 98.6 F (37 C) (!) 97.5 F (36.4 C) 98.6 F (37 C)  TempSrc: Oral Oral Oral Oral  SpO2: 90% 93% 97% 99%  Weight:      Height:        Body mass index is 24.47 kg/m.  General exam: Pleasant elderly Caucasian female.  Not in distress Skin: No rashes, lesions or ulcers. HEENT: Atraumatic, normocephalic, no obvious bleeding Lungs: Clear to auscultation bilaterally CVS: Regular rate and rhythm, no murmur GI/Abd soft, nontender, nondistended, bowel sound present CNS: Sleeping, wakes up on verbal command. Psychiatry: Mood appropriate Extremities: No edema, no calf tenderness  Follow ups:   Discharge Instructions    Increase activity slowly   Complete by: As directed       Follow-up Information    Shary Key, DO Follow up.   Specialty: Family Medicine Contact information: [place] [id number] [phone]               Recommendations for Outpatient Follow-Up:   1. Follow-up with oncology as an outpatient  Discharge Instructions:  Follow with Primary MD Shary Key, DO in 7 days   Get CBC/BMP checked in next visit within 1 week by PCP or SNF MD ( we routinely change or add medications that can affect your baseline labs and fluid status, therefore we recommend that you get the mentioned basic workup next visit with your PCP, your PCP may decide not to get them or add  new tests based on their clinical decision)  On your next visit with your PCP, please Get Medicines reviewed and adjusted.  Please request your PCP  to go over all Hospital Tests and Procedure/Radiological results at the follow up, please get all Hospital records sent to your Prim MD by signing hospital release before you go home.  Activity: As tolerated with Full fall precautions use walker/cane & assistance as needed  For Heart failure patients - Check your Weight same time everyday, if you gain over 2 pounds, or you develop in leg swelling, experience more shortness of breath or chest pain, call your Primary MD immediately. Follow Cardiac Low Salt Diet and 1.5 lit/day fluid restriction.  If you have smoked or chewed Tobacco in the last 2 yrs please stop smoking, stop any regular Alcohol  and or any Recreational drug use.  If you experience worsening of your admission symptoms, develop shortness of breath, life threatening emergency, suicidal or homicidal thoughts you must seek medical attention immediately by calling 911 or calling your MD immediately  if symptoms less severe.  You Must read complete instructions/literature along with all the possible adverse reactions/side effects for all the Medicines you take and that have been prescribed to you. Take any new Medicines after  you have completely understood and accpet all the possible adverse reactions/side effects.   Do not drive, operate heavy machinery, perform activities at heights, swimming or participation in water activities or provide baby sitting services if your were admitted for syncope or siezures until you have seen by Primary MD or a Neurologist and advised to do so again.  Do not drive when taking Pain medications.  Do not take more than prescribed Pain, Sleep and Anxiety Medications  Wear Seat belts while driving.   Please note You were cared for by a hospitalist during your hospital stay. If you have any questions about  your discharge medications or the care you received while you were in the hospital after you are discharged, you can call the unit and asked to speak with the hospitalist on call if the hospitalist that took care of you is not available. Once you are discharged, your primary care physician will handle any further medical issues. Please note that NO REFILLS for any discharge medications will be authorized once you are discharged, as it is imperative that you return to your primary care physician (or establish a relationship with a primary care physician if you do not have one) for your aftercare needs so that they can reassess your need for medications and monitor your lab values.    Allergies as of 07/16/2020      Reactions   Seroquel [quetiapine Fumarate] Other (See Comments)   Reaction:  Drowsiness    Amoxicillin Nausea Only, Other (See Comments)   Has patient had a PCN reaction causing immediate rash, facial/tongue/throat swelling, SOB or lightheadedness with hypotension: No Has patient had a PCN reaction causing severe rash involving mucus membranes or skin necrosis: No Has patient had a PCN reaction that required hospitalization No Has patient had a PCN reaction occurring within the last 10 years: No If all of the above answers are "NO", then may proceed with Cephalosporin use.   Ciprofloxacin Other (See Comments)   Reaction:  Unknown    Haloperidol Lactate Other (See Comments)   Reaction:  Arm stiffness    Ketorolac Tromethamine Other (See Comments)   Reaction:  Unknown    Latuda [lurasidone Hcl] Other (See Comments)   Pt states that this medication makes her feel "weird".     Thorazine [chlorpromazine] Other (See Comments)   Reaction:  Unknown    Wellbutrin [bupropion] Other (See Comments)   Pt states that this medication makes her feel "weird".        Medication List    STOP taking these medications   meloxicam 7.5 MG tablet Commonly known as: Mobic     TAKE these  medications   amLODipine 5 MG tablet Commonly known as: NORVASC Take 1 tablet (5 mg total) by mouth daily.   aspirin 325 MG tablet Take 325 mg by mouth daily.   escitalopram 20 MG tablet Commonly known as: LEXAPRO TAKE 1 TABLET BY MOUTH EVERY DAY   levothyroxine 125 MCG tablet Commonly known as: SYNTHROID Take 1 tablet (125 mcg total) by mouth every morning. 30 minutes before food   loperamide 2 MG capsule Commonly known as: IMODIUM Take by mouth as needed for diarrhea or loose stools (as needed for diarrhea).   promethazine 12.5 MG tablet Commonly known as: PHENERGAN Take 1 tablet (12.5 mg total) by mouth every 8 (eight) hours as needed for nausea or vomiting.   tamoxifen 20 MG tablet Commonly known as: NOLVADEX Take 1 tablet (20 mg total) by mouth daily.  Time coordinating discharge: 35 minutes  The results of significant diagnostics from this hospitalization (including imaging, microbiology, ancillary and laboratory) are listed below for reference.    Procedures and Diagnostic Studies:   CT Angio Head W or Wo Contrast  Result Date: 07/11/2020 CLINICAL DATA:  Dizziness and nausea EXAM: CT ANGIOGRAPHY HEAD AND NECK TECHNIQUE: Multidetector CT imaging of the head and neck was performed using the standard protocol during bolus administration of intravenous contrast. Multiplanar CT image reconstructions and MIPs were obtained to evaluate the vascular anatomy. Carotid stenosis measurements (when applicable) are obtained utilizing NASCET criteria, using the distal internal carotid diameter as the denominator. CONTRAST:  66mL OMNIPAQUE IOHEXOL 350 MG/ML SOLN COMPARISON:  None. FINDINGS: CT HEAD FINDINGS Brain: There is no mass, hemorrhage or extra-axial collection. The size and configuration of the ventricles and extra-axial CSF spaces are normal. There is hypoattenuation of the periventricular white matter, most commonly indicating chronic ischemic microangiopathy. Skull: The  visualized skull base, calvarium and extracranial soft tissues are normal. Sinuses/Orbits: No fluid levels or advanced mucosal thickening of the visualized paranasal sinuses. No mastoid or middle ear effusion. The orbits are normal. CTA NECK FINDINGS SKELETON: Diffuse sclerotic change of the vertebral column. OTHER NECK: Normal pharynx, larynx and major salivary glands. No cervical lymphadenopathy. Unremarkable thyroid gland. UPPER CHEST: No pneumothorax or pleural effusion. No nodules or masses. AORTIC ARCH: There is calcific atherosclerosis of the aortic arch. There is no aneurysm, dissection or hemodynamically significant stenosis of the visualized portion of the aorta. Conventional 3 vessel aortic branching pattern. The visualized proximal subclavian arteries are widely patent. RIGHT CAROTID SYSTEM: Normal without aneurysm, dissection or stenosis. LEFT CAROTID SYSTEM: Normal without aneurysm, dissection or stenosis. VERTEBRAL ARTERIES: Left dominant configuration. Both origins are clearly patent. There is no dissection, occlusion or flow-limiting stenosis to the skull base (V1-V3 segments). CTA HEAD FINDINGS POSTERIOR CIRCULATION: --Vertebral arteries: Normal V4 segments. --Inferior cerebellar arteries: Normal. --Basilar artery: Normal. --Superior cerebellar arteries: Normal. --Posterior cerebral arteries (PCA): Multifocal mild-to-moderate stenosis. ANTERIOR CIRCULATION: --Intracranial internal carotid arteries: Atherosclerotic calcification of the internal carotid arteries at the skull base without hemodynamically significant stenosis. --Anterior cerebral arteries (ACA): Normal. Both A1 segments are present. Patent anterior communicating artery (a-comm). --Middle cerebral arteries (MCA): Multifocal atherosclerotic irregularity without high-grade stenosis. VENOUS SINUSES: As permitted by contrast timing, patent. ANATOMIC VARIANTS: None Review of the MIP images confirms the above findings. IMPRESSION: 1. No  emergent large vessel occlusion or high-grade stenosis of the head or neck. 2. Multifocal mild-to-moderate stenosis of both posterior cerebral arteries and otherwise diffuse mild atherosclerotic irregularity of the intracranial arteries. 3. Diffuse sclerotic change within the axial skeleton, concerning for sclerotic metastatic disease in a patient with history of breast cancer. Other possibilities include hyperparathyroidism or renal osteodystrophy. Aortic Atherosclerosis (ICD10-I70.0). Electronically Signed   By: Ulyses Jarred M.D.   On: 07/11/2020 19:50   CT Angio Neck W and/or Wo Contrast  Result Date: 07/11/2020 CLINICAL DATA:  Dizziness and nausea EXAM: CT ANGIOGRAPHY HEAD AND NECK TECHNIQUE: Multidetector CT imaging of the head and neck was performed using the standard protocol during bolus administration of intravenous contrast. Multiplanar CT image reconstructions and MIPs were obtained to evaluate the vascular anatomy. Carotid stenosis measurements (when applicable) are obtained utilizing NASCET criteria, using the distal internal carotid diameter as the denominator. CONTRAST:  27mL OMNIPAQUE IOHEXOL 350 MG/ML SOLN COMPARISON:  None. FINDINGS: CT HEAD FINDINGS Brain: There is no mass, hemorrhage or extra-axial collection. The size and configuration of the ventricles and  extra-axial CSF spaces are normal. There is hypoattenuation of the periventricular white matter, most commonly indicating chronic ischemic microangiopathy. Skull: The visualized skull base, calvarium and extracranial soft tissues are normal. Sinuses/Orbits: No fluid levels or advanced mucosal thickening of the visualized paranasal sinuses. No mastoid or middle ear effusion. The orbits are normal. CTA NECK FINDINGS SKELETON: Diffuse sclerotic change of the vertebral column. OTHER NECK: Normal pharynx, larynx and major salivary glands. No cervical lymphadenopathy. Unremarkable thyroid gland. UPPER CHEST: No pneumothorax or pleural effusion.  No nodules or masses. AORTIC ARCH: There is calcific atherosclerosis of the aortic arch. There is no aneurysm, dissection or hemodynamically significant stenosis of the visualized portion of the aorta. Conventional 3 vessel aortic branching pattern. The visualized proximal subclavian arteries are widely patent. RIGHT CAROTID SYSTEM: Normal without aneurysm, dissection or stenosis. LEFT CAROTID SYSTEM: Normal without aneurysm, dissection or stenosis. VERTEBRAL ARTERIES: Left dominant configuration. Both origins are clearly patent. There is no dissection, occlusion or flow-limiting stenosis to the skull base (V1-V3 segments). CTA HEAD FINDINGS POSTERIOR CIRCULATION: --Vertebral arteries: Normal V4 segments. --Inferior cerebellar arteries: Normal. --Basilar artery: Normal. --Superior cerebellar arteries: Normal. --Posterior cerebral arteries (PCA): Multifocal mild-to-moderate stenosis. ANTERIOR CIRCULATION: --Intracranial internal carotid arteries: Atherosclerotic calcification of the internal carotid arteries at the skull base without hemodynamically significant stenosis. --Anterior cerebral arteries (ACA): Normal. Both A1 segments are present. Patent anterior communicating artery (a-comm). --Middle cerebral arteries (MCA): Multifocal atherosclerotic irregularity without high-grade stenosis. VENOUS SINUSES: As permitted by contrast timing, patent. ANATOMIC VARIANTS: None Review of the MIP images confirms the above findings. IMPRESSION: 1. No emergent large vessel occlusion or high-grade stenosis of the head or neck. 2. Multifocal mild-to-moderate stenosis of both posterior cerebral arteries and otherwise diffuse mild atherosclerotic irregularity of the intracranial arteries. 3. Diffuse sclerotic change within the axial skeleton, concerning for sclerotic metastatic disease in a patient with history of breast cancer. Other possibilities include hyperparathyroidism or renal osteodystrophy. Aortic Atherosclerosis  (ICD10-I70.0). Electronically Signed   By: Ulyses Jarred M.D.   On: 07/11/2020 19:50   MR Brain W and Wo Contrast  Result Date: 07/11/2020 CLINICAL DATA:  Dizziness and nausea EXAM: MRI HEAD WITHOUT AND WITH CONTRAST TECHNIQUE: Multiplanar, multiecho pulse sequences of the brain and surrounding structures were obtained without and with intravenous contrast. CONTRAST:  78mL GADAVIST GADOBUTROL 1 MMOL/ML IV SOLN COMPARISON:  10/19/2009 FINDINGS: Brain: No acute infarct, mass effect or extra-axial collection. No acute or chronic hemorrhage. Hyperintense T2-weighted signal is moderately widespread throughout the white matter. Parenchymal volume and CSF spaces are normal. The midline structures are normal. There is no abnormal contrast enhancement. Vascular: Major flow voids are preserved. Skull and upper cervical spine: Normal calvarium and skull base. Visualized upper cervical spine and soft tissues are normal. Sinuses/Orbits:No paranasal sinus fluid levels or advanced mucosal thickening. No mastoid or middle ear effusion. Normal orbits. IMPRESSION: 1. No acute intracranial abnormality. 2. Findings of chronic microvascular ischemia. Electronically Signed   By: Ulyses Jarred M.D.   On: 07/11/2020 21:21   DG Chest Port 1 View  Result Date: 07/11/2020 CLINICAL DATA:  Vomiting.  Near syncope. EXAM: PORTABLE CHEST 1 VIEW COMPARISON:  06/15/2019. FINDINGS: Normal heart size. Unchanged mediastinal contours with aortic atherosclerosis. Chronic hyperinflation and interstitial coarsening. No pneumothorax, pneumomediastinum, or pleural effusion. No focal airspace disease. No pulmonary edema. Surgical clips in the left thoracic inlet. Left mastectomy. IMPRESSION: 1. No acute abnormality. 2. Chronic hyperinflation and bronchial thickening, imaging findings consistent with COPD. Aortic Atherosclerosis (ICD10-I70.0). Electronically Signed  By: Keith Rake M.D.   On: 07/11/2020 17:46   ECHOCARDIOGRAM COMPLETE  Result  Date: 07/12/2020    ECHOCARDIOGRAM REPORT   Patient Name:   MAKINNA ANDY [facility] Date of Exam: 07/12/2020 Medical Rec #:  [id number]     Height:       57.0 in Accession #:    [id number]    Weight:       113.1 lb Date of Birth:  01/08/1946     BSA:          1.412 m Patient Age:    75 years      BP:           130/69 mmHg Patient Gender: F             HR:           71 bpm. Exam Location:  Inpatient Procedure: 2D Echo, 3D Echo, Cardiac Doppler and Color Doppler Indications:    Elevated troponin. ; R07.9* Chest pain, unspecified  History:        Patient has prior history of Echocardiogram examinations, most                 recent 10/20/2009. Abnormal ECG, TIA, Stroke and COPD,                 Signs/Symptoms:Dizziness/Lightheadedness; Risk                 Factors:Hypertension, Dyslipidemia and Current Smoker. Breast                 cancer.  Sonographer:    Roseanna Rainbow RDCS Referring Phys: [id number] [place] T [facility]  Sonographer Comments: Technically difficult study due to poor echo windows. Image acquisition challenging due to mastectomy. IMPRESSIONS  1. Inferobasal hypokinesis . Left ventricular ejection fraction, by estimation, is 55%. The left ventricle has normal function. The left ventricle demonstrates regional wall motion abnormalities (see scoring diagram/findings for description). Left ventricular diastolic parameters were normal.  2. Right ventricular systolic function is normal. The right ventricular size is normal.  3. The mitral valve is abnormal. Moderate mitral valve regurgitation. No evidence of mitral stenosis.  4. The aortic valve is normal in structure. Aortic valve regurgitation is not visualized. No aortic stenosis is present.  5. The inferior vena cava is normal in size with greater than 50% respiratory variability, suggesting right atrial pressure of 3 mmHg. FINDINGS  Left Ventricle: Inferobasal hypokinesis. Left ventricular ejection fraction, by estimation, is 55%. The left ventricle has normal function. The left  ventricle demonstrates regional wall motion abnormalities. The left ventricular internal cavity size was  normal in size. There is no left ventricular hypertrophy. Left ventricular diastolic parameters were normal. Right Ventricle: The right ventricular size is normal. No increase in right ventricular wall thickness. Right ventricular systolic function is normal. Left Atrium: Left atrial size was normal in size. Right Atrium: Right atrial size was normal in size. Pericardium: There is no evidence of pericardial effusion. Mitral Valve: The mitral valve is abnormal. There is moderate thickening of the mitral valve leaflet(s). There is moderate calcification of the mitral valve leaflet(s). Moderate mitral valve regurgitation. No evidence of mitral valve stenosis. Tricuspid Valve: The tricuspid valve is normal in structure. Tricuspid valve regurgitation is trivial. No evidence of tricuspid stenosis. Aortic Valve: The aortic valve is normal in structure. Aortic valve regurgitation is not visualized. No aortic stenosis is present. Pulmonic Valve: The pulmonic valve was normal in structure. Pulmonic valve regurgitation is not  visualized. No evidence of pulmonic stenosis. Aorta: The aortic root is normal in size and structure. Venous: The inferior vena cava is normal in size with greater than 50% respiratory variability, suggesting right atrial pressure of 3 mmHg. IAS/Shunts: No atrial level shunt detected by color flow Doppler.  LEFT VENTRICLE PLAX 2D LVIDd:         4.30 cm     Diastology LVIDs:         3.30 cm     LV e' medial:    6.09 cm/s LV PW:         1.00 cm     LV E/e' medial:  13.2 LV IVS:        0.90 cm     LV e' lateral:   7.18 cm/s LVOT diam:     2.10 cm     LV E/e' lateral: 11.2 LV SV:         53 LV SV Index:   37 LVOT Area:     3.46 cm                             3D Volume EF: LV Volumes (MOD)           3D EF:        52 % LV vol d, MOD A2C: 54.0 ml LV EDV:       117 ml LV vol d, MOD A4C: 56.0 ml LV ESV:        56 ml LV vol s, MOD A2C: 25.5 ml LV SV:        61 ml LV vol s, MOD A4C: 28.6 ml LV SV MOD A2C:     28.5 ml LV SV MOD A4C:     56.0 ml LV SV MOD BP:      28.2 ml RIGHT VENTRICLE            IVC RV S prime:     8.92 cm/s  IVC diam: 1.60 cm TAPSE (M-mode): 1.9 cm LEFT ATRIUM           Index       RIGHT ATRIUM           Index LA diam:      2.80 cm 1.98 cm/m  RA Area:     11.20 cm LA Vol (A2C): 31.1 ml 22.03 ml/m RA Volume:   23.90 ml  16.93 ml/m LA Vol (A4C): 14.6 ml 10.34 ml/m  AORTIC VALVE LVOT Vmax:   83.60 cm/s LVOT Vmean:  58.100 cm/s LVOT VTI:    0.152 m  AORTA Ao Root diam: 2.80 cm MITRAL VALVE MV Area (PHT): 3.17 cm      SHUNTS MV Decel Time: 239 msec      Systemic VTI:  0.15 m MR Peak grad:    139.7 mmHg  Systemic Diam: 2.10 cm MR Mean grad:    79.0 mmHg MR Vmax:         591.00 cm/s MR Vmean:        410.5 cm/s MR PISA:         1.01 cm MR PISA Eff ROA: 6 mm MR PISA Radius:  0.40 cm MV E velocity: 80.20 cm/s MV A velocity: 89.00 cm/s MV E/A ratio:  0.90 Jenkins Rouge MD Electronically signed by Jenkins Rouge MD Signature Date/Time: 07/12/2020/11:20:25 AM    Final      Labs:   Basic Metabolic Panel: Recent Labs  Lab  07/11/20 1759 07/11/20 1813 07/12/20 0500 07/13/20 0639 07/14/20 0910 07/15/20 1244  NA 143 144 143 143 139 140  K 3.6 3.6 3.6 4.0 3.9 3.6  CL 110 111 111 111 110 110  CO2 25  --  24 24 24 24   GLUCOSE 89 85 101* 113* 99 117*  BUN 21 21 17 20 13 13   CREATININE 0.74 0.80 0.79 0.80 0.70 0.65  CALCIUM 8.3*  --  8.2* 8.5* 8.1* 8.2*   GFR Estimated Creatinine Clearance: 41.3 mL/min (by C-G formula based on SCr of 0.65 mg/dL). Liver Function Tests: Recent Labs  Lab 07/11/20 1759 07/13/20 0639 07/14/20 0910 07/15/20 1244  AST 12* 20 19 15   ALT 8 13 11 9   ALKPHOS 392* 354* 345* 320*  BILITOT 0.6 0.4 0.6 0.6  PROT 5.9* 5.3* 5.4* 5.1*  ALBUMIN 3.3* 3.0* 3.1* 2.8*   Recent Labs  Lab 07/11/20 1759  LIPASE 37   No results for input(s): AMMONIA in the last 168  hours. Coagulation profile No results for input(s): INR, PROTIME in the last 168 hours.  CBC: Recent Labs  Lab 07/11/20 1759 07/11/20 1813 07/12/20 0500 07/13/20 0639 07/14/20 0910 07/15/20 1244  WBC 4.2  --  4.4 4.1 4.5 5.5  NEUTROABS 2.8  --   --   --   --   --   HGB 12.3 12.6 11.8* 11.2* 11.2* 11.3*  HCT 38.8 37.0 36.6 35.7* 35.3* 34.7*  MCV 103.2*  --  104.3* 105.3* 104.1* 104.5*  PLT 189  --  158 180 177 184   Cardiac Enzymes: No results for input(s): CKTOTAL, CKMB, CKMBINDEX, TROPONINI in the last 168 hours. BNP: Invalid input(s): POCBNP CBG: No results for input(s): GLUCAP in the last 168 hours. D-Dimer No results for input(s): DDIMER in the last 72 hours. Hgb A1c No results for input(s): HGBA1C in the last 72 hours. Lipid Profile No results for input(s): CHOL, HDL, LDLCALC, TRIG, CHOLHDL, LDLDIRECT in the last 72 hours. Thyroid function studies No results for input(s): TSH, T4TOTAL, T3FREE, THYROIDAB in the last 72 hours.  Invalid input(s): FREET3 Anemia work up No results for input(s): VITAMINB12, FOLATE, FERRITIN, TIBC, IRON, RETICCTPCT in the last 72 hours. Microbiology Recent Results (from the past 240 hour(s))  Resp Panel by RT-PCR (Flu A&B, Covid) Nasopharyngeal Swab     Status: None   Collection Time: 07/11/20  8:27 PM   Specimen: Nasopharyngeal Swab; Nasopharyngeal(NP) swabs in vial transport medium  Result Value Ref Range Status   SARS Coronavirus 2 by RT PCR NEGATIVE NEGATIVE Final    Comment: (NOTE) SARS-CoV-2 target nucleic acids are NOT DETECTED.  The SARS-CoV-2 RNA is generally detectable in upper respiratory specimens during the acute phase of infection. The lowest concentration of SARS-CoV-2 viral copies this assay can detect is 138 copies/mL. A negative result does not preclude SARS-Cov-2 infection and should not be used as the sole basis for treatment or other patient management decisions. A negative result may occur with  improper  specimen collection/handling, submission of specimen other than nasopharyngeal swab, presence of viral mutation(s) within the areas targeted by this assay, and inadequate number of viral copies(<138 copies/mL). A negative result must be combined with clinical observations, patient history, and epidemiological information. The expected result is Negative.  Fact Sheet for Patients:  EntrepreneurPulse.com.au  Fact Sheet for Healthcare Providers:  IncredibleEmployment.be  This test is no t yet approved or cleared by the [place] FDA and  has been authorized for detection and/or diagnosis of SARS-CoV-2  by FDA under an Emergency Use Authorization (EUA). This EUA will remain  in effect (meaning this test can be used) for the duration of the COVID-19 declaration under Section 564(b)(1) of the Act, 21 U.S.C.section 360bbb-3(b)(1), unless the authorization is terminated  or revoked sooner.       Influenza A by PCR NEGATIVE NEGATIVE Final   Influenza B by PCR NEGATIVE NEGATIVE Final    Comment: (NOTE) The Xpert Xpress SARS-CoV-2/FLU/RSV plus assay is intended as an aid in the diagnosis of influenza from Nasopharyngeal swab specimens and should not be used as a sole basis for treatment. Nasal washings and aspirates are unacceptable for Xpert Xpress SARS-CoV-2/FLU/RSV testing.  Fact Sheet for Patients: EntrepreneurPulse.com.au  Fact Sheet for Healthcare Providers: IncredibleEmployment.be  This test is not yet approved or cleared by the [place] FDA and has been authorized for detection and/or diagnosis of SARS-CoV-2 by FDA under an Emergency Use Authorization (EUA). This EUA will remain in effect (meaning this test can be used) for the duration of the COVID-19 declaration under Section 564(b)(1) of the Act, 21 U.S.C. section 360bbb-3(b)(1), unless the authorization is terminated or revoked.  Performed at  [facility], [place] [address], [place], [place] [address]   SARS CORONAVIRUS 2 (TAT 6-24 HRS) Nasopharyngeal Nasopharyngeal Swab     Status: None   Collection Time: 07/15/20 10:32 AM   Specimen: Nasopharyngeal Swab  Result Value Ref Range Status   SARS Coronavirus 2 NEGATIVE NEGATIVE Final    Comment: (NOTE) SARS-CoV-2 target nucleic acids are NOT DETECTED.  The SARS-CoV-2 RNA is generally detectable in upper and lower respiratory specimens during the acute phase of infection. Negative results do not preclude SARS-CoV-2 infection, do not rule out co-infections with other pathogens, and should not be used as the sole basis for treatment or other patient management decisions. Negative results must be combined with clinical observations, patient history, and epidemiological information. The expected result is Negative.  Fact Sheet for Patients: SugarRoll.be  Fact Sheet for Healthcare Providers: https://www.woods-mathews.com/  This test is not yet approved or cleared by the [place] FDA and  has been authorized for detection and/or diagnosis of SARS-CoV-2 by FDA under an Emergency Use Authorization (EUA). This EUA will remain  in effect (meaning this test can be used) for the duration of the COVID-19 declaration under Se ction 564(b)(1) of the Act, 21 U.S.C. section 360bbb-3(b)(1), unless the authorization is terminated or revoked sooner.  Performed at [facility], [place] [address] [place], [place] [address]      Signed: Terrilee Croak  [facility] 07/16/2020, 11:05 AM

## 2020-07-16 NOTE — Progress Notes (Signed)
PT Cancellation Note  Patient Details Name: [name] MRN: [id number] DOB: 1945/11/19   Cancelled Treatment:    Reason Eval/Treat Not Completed: Patient declined, no reason specified Pt reports she needs to sleep.  Typically sleeps all day at home per previous notes.  Pt encouraged to mobilize however continues to decline to participate at this time.    Sayward Horvath,KATHrine E 07/16/2020, 9:41 AM Jannette Spanner PT, DPT Acute Rehabilitation Services Pager: [phone] Office: [phone]

## 2020-07-17 ENCOUNTER — Other Ambulatory Visit: Payer: Self-pay | Admitting: Family Medicine

## 2020-07-17 DIAGNOSIS — R110 Nausea: Secondary | ICD-10-CM

## 2020-07-19 ENCOUNTER — Encounter (HOSPITAL_COMMUNITY): Payer: Self-pay

## 2020-07-19 ENCOUNTER — Other Ambulatory Visit: Payer: Self-pay

## 2020-07-19 ENCOUNTER — Inpatient Hospital Stay (HOSPITAL_COMMUNITY)
Admission: EM | Admit: 2020-07-19 | Discharge: 2020-07-29 | DRG: 641 | Disposition: A | Discharge status: Skilled Nursing Facility | Payer: Medicare Other | Attending: Internal Medicine | Admitting: Internal Medicine

## 2020-07-19 DIAGNOSIS — Z20822 Contact with and (suspected) exposure to covid-19: Secondary | ICD-10-CM | POA: Diagnosis present

## 2020-07-19 DIAGNOSIS — R000 Tachycardia, unspecified: Secondary | ICD-10-CM | POA: Diagnosis present

## 2020-07-19 DIAGNOSIS — E876 Hypokalemia: Secondary | ICD-10-CM | POA: Diagnosis not present

## 2020-07-19 DIAGNOSIS — Z8249 Family history of ischemic heart disease and other diseases of the circulatory system: Secondary | ICD-10-CM

## 2020-07-19 DIAGNOSIS — M1990 Unspecified osteoarthritis, unspecified site: Secondary | ICD-10-CM | POA: Diagnosis present

## 2020-07-19 DIAGNOSIS — M899 Disorder of bone, unspecified: Secondary | ICD-10-CM | POA: Diagnosis present

## 2020-07-19 DIAGNOSIS — Z881 Allergy status to other antibiotic agents status: Secondary | ICD-10-CM

## 2020-07-19 DIAGNOSIS — Z8673 Personal history of transient ischemic attack (TIA), and cerebral infarction without residual deficits: Secondary | ICD-10-CM

## 2020-07-19 DIAGNOSIS — J449 Chronic obstructive pulmonary disease, unspecified: Secondary | ICD-10-CM | POA: Diagnosis present

## 2020-07-19 DIAGNOSIS — R531 Weakness: Secondary | ICD-10-CM | POA: Diagnosis not present

## 2020-07-19 DIAGNOSIS — R627 Adult failure to thrive: Secondary | ICD-10-CM | POA: Diagnosis present

## 2020-07-19 DIAGNOSIS — Z923 Personal history of irradiation: Secondary | ICD-10-CM

## 2020-07-19 DIAGNOSIS — E46 Unspecified protein-calorie malnutrition: Secondary | ICD-10-CM | POA: Diagnosis not present

## 2020-07-19 DIAGNOSIS — F419 Anxiety disorder, unspecified: Secondary | ICD-10-CM | POA: Diagnosis present

## 2020-07-19 DIAGNOSIS — Z515 Encounter for palliative care: Secondary | ICD-10-CM

## 2020-07-19 DIAGNOSIS — Z880 Allergy status to penicillin: Secondary | ICD-10-CM

## 2020-07-19 DIAGNOSIS — F332 Major depressive disorder, recurrent severe without psychotic features: Secondary | ICD-10-CM | POA: Diagnosis present

## 2020-07-19 DIAGNOSIS — E860 Dehydration: Secondary | ICD-10-CM | POA: Diagnosis present

## 2020-07-19 DIAGNOSIS — D7589 Other specified diseases of blood and blood-forming organs: Secondary | ICD-10-CM | POA: Diagnosis not present

## 2020-07-19 DIAGNOSIS — Z170 Estrogen receptor positive status [ER+]: Secondary | ICD-10-CM

## 2020-07-19 DIAGNOSIS — R42 Dizziness and giddiness: Secondary | ICD-10-CM | POA: Diagnosis not present

## 2020-07-19 DIAGNOSIS — Z9841 Cataract extraction status, right eye: Secondary | ICD-10-CM

## 2020-07-19 DIAGNOSIS — D509 Iron deficiency anemia, unspecified: Secondary | ICD-10-CM | POA: Diagnosis present

## 2020-07-19 DIAGNOSIS — D539 Nutritional anemia, unspecified: Secondary | ICD-10-CM | POA: Diagnosis present

## 2020-07-19 DIAGNOSIS — I10 Essential (primary) hypertension: Secondary | ICD-10-CM | POA: Diagnosis present

## 2020-07-19 DIAGNOSIS — C50812 Malignant neoplasm of overlapping sites of left female breast: Secondary | ICD-10-CM | POA: Diagnosis present

## 2020-07-19 DIAGNOSIS — Z7982 Long term (current) use of aspirin: Secondary | ICD-10-CM

## 2020-07-19 DIAGNOSIS — D519 Vitamin B12 deficiency anemia, unspecified: Secondary | ICD-10-CM | POA: Diagnosis present

## 2020-07-19 DIAGNOSIS — M549 Dorsalgia, unspecified: Secondary | ICD-10-CM | POA: Diagnosis not present

## 2020-07-19 DIAGNOSIS — Z79899 Other long term (current) drug therapy: Secondary | ICD-10-CM

## 2020-07-19 DIAGNOSIS — K5900 Constipation, unspecified: Secondary | ICD-10-CM | POA: Diagnosis present

## 2020-07-19 DIAGNOSIS — Z90710 Acquired absence of both cervix and uterus: Secondary | ICD-10-CM

## 2020-07-19 DIAGNOSIS — R410 Disorientation, unspecified: Secondary | ICD-10-CM | POA: Diagnosis not present

## 2020-07-19 DIAGNOSIS — R824 Acetonuria: Secondary | ICD-10-CM | POA: Diagnosis present

## 2020-07-19 DIAGNOSIS — Z79810 Long term (current) use of selective estrogen receptor modulators (SERMs): Secondary | ICD-10-CM

## 2020-07-19 DIAGNOSIS — G301 Alzheimer's disease with late onset: Secondary | ICD-10-CM | POA: Diagnosis present

## 2020-07-19 DIAGNOSIS — R0902 Hypoxemia: Secondary | ICD-10-CM | POA: Diagnosis not present

## 2020-07-19 DIAGNOSIS — M5430 Sciatica, unspecified side: Secondary | ICD-10-CM | POA: Diagnosis present

## 2020-07-19 DIAGNOSIS — Z6824 Body mass index (BMI) 24.0-24.9, adult: Secondary | ICD-10-CM

## 2020-07-19 DIAGNOSIS — G4700 Insomnia, unspecified: Secondary | ICD-10-CM | POA: Diagnosis present

## 2020-07-19 DIAGNOSIS — E039 Hypothyroidism, unspecified: Secondary | ICD-10-CM | POA: Diagnosis present

## 2020-07-19 DIAGNOSIS — Z800 Family history of malignant neoplasm of digestive organs: Secondary | ICD-10-CM

## 2020-07-19 DIAGNOSIS — Z87891 Personal history of nicotine dependence: Secondary | ICD-10-CM

## 2020-07-19 DIAGNOSIS — Z8711 Personal history of peptic ulcer disease: Secondary | ICD-10-CM

## 2020-07-19 DIAGNOSIS — F0281 Dementia in other diseases classified elsewhere with behavioral disturbance: Secondary | ICD-10-CM | POA: Diagnosis not present

## 2020-07-19 DIAGNOSIS — I499 Cardiac arrhythmia, unspecified: Secondary | ICD-10-CM | POA: Diagnosis not present

## 2020-07-19 DIAGNOSIS — F02818 Dementia in other diseases classified elsewhere, unspecified severity, with other behavioral disturbance: Secondary | ICD-10-CM | POA: Diagnosis present

## 2020-07-19 DIAGNOSIS — Z9012 Acquired absence of left breast and nipple: Secondary | ICD-10-CM

## 2020-07-19 DIAGNOSIS — R9431 Abnormal electrocardiogram [ECG] [EKG]: Secondary | ICD-10-CM | POA: Diagnosis not present

## 2020-07-19 DIAGNOSIS — Z888 Allergy status to other drugs, medicaments and biological substances status: Secondary | ICD-10-CM

## 2020-07-19 DIAGNOSIS — K219 Gastro-esophageal reflux disease without esophagitis: Secondary | ICD-10-CM | POA: Diagnosis present

## 2020-07-19 LAB — CBC WITH DIFFERENTIAL/PLATELET
Abs Immature Granulocytes: 0.02 10*3/uL (ref 0.00–0.07)
Basophils Absolute: 0 10*3/uL (ref 0.0–0.1)
Basophils Relative: 0 %
Eosinophils Absolute: 0 10*3/uL (ref 0.0–0.5)
Eosinophils Relative: 0 %
HCT: 35.6 % — ABNORMAL LOW (ref 36.0–46.0)
Hemoglobin: 11.6 g/dL — ABNORMAL LOW (ref 12.0–15.0)
Immature Granulocytes: 0 %
Lymphocytes Relative: 7 %
Lymphs Abs: 0.5 10*3/uL — ABNORMAL LOW (ref 0.7–4.0)
MCH: 33.4 pg (ref 26.0–34.0)
MCHC: 32.6 g/dL (ref 30.0–36.0)
MCV: 102.6 fL — ABNORMAL HIGH (ref 80.0–100.0)
Monocytes Absolute: 0.8 10*3/uL (ref 0.1–1.0)
Monocytes Relative: 11 %
Neutro Abs: 6 10*3/uL (ref 1.7–7.7)
Neutrophils Relative %: 82 %
Platelets: 260 10*3/uL (ref 150–400)
RBC: 3.47 MIL/uL — ABNORMAL LOW (ref 3.87–5.11)
RDW: 13 % (ref 11.5–15.5)
WBC: 7.4 10*3/uL (ref 4.0–10.5)
nRBC: 0 % (ref 0.0–0.2)

## 2020-07-19 LAB — CK: Total CK: 39 U/L (ref 38–234)

## 2020-07-19 LAB — BASIC METABOLIC PANEL
Anion gap: 8 (ref 5–15)
BUN: 14 mg/dL (ref 8–23)
CO2: 24 mmol/L (ref 22–32)
Calcium: 8.1 mg/dL — ABNORMAL LOW (ref 8.9–10.3)
Chloride: 109 mmol/L (ref 98–111)
Creatinine, Ser: 0.65 mg/dL (ref 0.44–1.00)
GFR, Estimated: >60 mL/min (ref >60)
Glucose, Bld: 170 mg/dL — ABNORMAL HIGH (ref 70–99)
Potassium: 2.9 mmol/L — ABNORMAL LOW (ref 3.5–5.1)
Sodium: 141 mmol/L (ref 135–145)

## 2020-07-19 MED ORDER — SODIUM CHLORIDE 0.9 % IV BOLUS
1000.0000 mL | Freq: Once | INTRAVENOUS | Status: AC
  Administered 2020-07-19: 1000 mL via INTRAVENOUS

## 2020-07-19 MED ORDER — POTASSIUM CHLORIDE CRYS ER 20 MEQ PO TBCR
40.0000 meq | EXTENDED_RELEASE_TABLET | Freq: Once | ORAL | Status: AC
  Administered 2020-07-19: 40 meq via ORAL
  Filled 2020-07-19: qty 2

## 2020-07-19 NOTE — ED Provider Notes (Signed)
[place] DEPT Provider Note: Georgena Spurling, MD, FACEP  CSN: [id number] MRN: [id number] ARRIVAL: 07/19/20 at 2215 ROOM: [place]  Weakness   HISTORY OF PRESENT ILLNESS  07/19/20 10:54 PM [name] is a 75 y.o. female who was found on the floor at her home after her sister had not heard from her for the past 2 days.  The patient states that she has been lying on the floor for at least 2 days although she does not know how she got there.  She does not know if she fell or just was too weak to stand anymore.  She was unable to get up for those 2 days due to a combination of generalized weakness and generalized pain.  She rates her pain as a 5 out of 10, worse with movement.  Her pain is partly due to arthritis and partly due to sciatica.  She has had little to eat or drink and she states her mouth is very dry.  She was recently admitted to the hospital for dizziness and was discharged to rehab where she stayed for about 3 days.  She believes she has a mass on the back of her right neck and is concerned this may be recurrence of remote breast cancer.  CT angio neck from 07/11/2020 shows no obvious incidental soft tissue mass at that site.   Past Medical History:  Diagnosis Date  . Anxiety   . Arthritis   . Cancer [facility])    breast cancer  . COPD (chronic obstructive pulmonary disease) (HCC)    Due to long history of tobacco abuse, still smoking  . Depression   . Diverticulosis of colon   . GERD (gastroesophageal reflux disease)   . Heart murmur    not sure never been evaluated  . History of uterine prolapse 10/2004   Dr Cletis Media  . Hypertension    not on any medications  . Hypothyroidism   . OCD (obsessive compulsive disorder)   . Osteoporosis   . PUD (peptic ulcer disease) 09/2006   H pylori  . Thyroid disease    Ho R thyroid noduel plus mild hyperthyroidism. Treated with radioiodine therapty on 04/2006. Dr Estrella Myrtle.    Past Surgical History:  Procedure  Laterality Date  . ABDOMINAL HYSTERECTOMY    . BIOPSY THYROID  08/2008   Atypia and Hurtle cells, partial thyroidectomy   . BLADDER SUSPENSION     mesh sling  . Carotid ultrasound  09/10/09   No significant extracranial carotid artery stenosis, vertebrals are patent with antegrade flow.   [name] CATARACT EXTRACTION Right   . COLONOSCOPY  06/03/2005   Hyperplastic polyps, sigmoid diverticulosis, internal hemorroids  . EYE SURGERY     laser surgery does not know which eye  . FOREIGN BODY REMOVAL Right 02/21/2015   Procedure: RIGHT FOOT FOREIGN BODY REMOVAL ;  Surgeon: Marchia Bond, MD;  Location: [facility];  Service: Orthopedics;  Laterality: Right;  . MASTECTOMY COMPLETE / SIMPLE W/ SENTINEL NODE BIOPSY Left 05/13/2017  . MASTECTOMY W/ SENTINEL NODE BIOPSY Left 05/13/2017   Procedure: LEFT TOTAL MASTECTOMY WITH AXILLARY SENTINEL LYMPH NODE BIOPSY;  Surgeon: Excell Seltzer, MD;  Location: [facility];  Service: General;  Laterality: Left;  . MRI Head  09/20/09   No acute intracranial abn.  Signal abnormality suggestive of chronic small vessel ischemia, maximal in the right subinsular white and deep gray mater.   [name] PFT  11/2006   Obstructive pattern.  Poor response to bronchodilators.   . THYROIDECTOMY, PARTIAL  08/2008   Dr Ronnald Collum  . TONSILLECTOMY AND ADENOIDECTOMY  10/2004  . TRANSTHORACIC ECHOCARDIOGRAM  04/05/9145   Normal systolic function.  EF 55-60%.  Mild MR, atria ok, trivial pericardial effusion  . TRANSVAGINAL TAPE (TVT) REMOVAL  06/15/2010   Dr Mancel Bale, for urge incontinence    Family History  Problem Relation Age of Onset  . Heart disease Mother   . Stomach cancer Mother   . Cirrhosis Sister   . Hypertension Sister   . Heart disease Maternal Grandmother   . Heart disease Maternal Grandfather   . Colon cancer Maternal Aunt   . Other Neg Hx     Social History   Tobacco Use  . Smoking status: Former Smoker    Packs/day: 0.75    Years: 40.00    Pack years:  30.00    Types: Cigarettes    Quit date: 11/2016    Years since quitting: 3.7  . Smokeless tobacco: Never Used  Vaping Use  . Vaping Use: Never used  Substance Use Topics  . Alcohol use: No    Alcohol/week: 0.0 standard drinks  . Drug use: No    Prior to Admission medications   Medication Sig Start Date End Date Taking? Authorizing Provider  amLODipine (NORVASC) 5 MG tablet Take 1 tablet (5 mg total) by mouth daily. 07/16/20 07/16/21  Terrilee Croak, MD  aspirin 325 MG tablet Take 325 mg by mouth daily.    [provider]  escitalopram (LEXAPRO) 20 MG tablet TAKE 1 TABLET BY MOUTH EVERY DAY 06/23/20   Nicholas Lose, MD  levothyroxine (SYNTHROID) 125 MCG tablet Take 1 tablet (125 mcg total) by mouth every morning. 30 minutes before food 06/14/20   Shary Key, DO  loperamide (IMODIUM) 2 MG capsule Take by mouth as needed for diarrhea or loose stools (as needed for diarrhea).    [provider]  promethazine (PHENERGAN) 12.5 MG tablet Take 1 tablet (12.5 mg total) by mouth every 8 (eight) hours as needed for nausea or vomiting. 01/28/20   Shary Key, DO  tamoxifen (NOLVADEX) 20 MG tablet Take 1 tablet (20 mg total) by mouth daily. 03/25/20   Nicholas Lose, MD  traZODone (DESYREL) 50 MG tablet TAKE 1/2 TO 1 TABLET AT BEDTIME AS NEEDED FOR SLEEP 07/10/18 06/15/19  Guadalupe Dawn, MD    Allergies Seroquel [quetiapine fumarate], Amoxicillin, Ciprofloxacin, Haloperidol lactate, Ketorolac tromethamine, Latuda [lurasidone hcl], Thorazine [chlorpromazine], and Wellbutrin [bupropion]   REVIEW OF SYSTEMS  Negative except as noted here or in the History of Present Illness.   PHYSICAL EXAMINATION  Initial Vital Signs Blood pressure (!) 162/97, pulse (!) 101, temperature 98.1 F (36.7 C), resp. rate (!) 27, height 4\' 9"  (1.448 m), weight 51.3 kg, last menstrual period 01/09/2012, SpO2 96 %.  Examination General: Well-developed, well-nourished female in no acute  distress; appearance consistent with age of record HENT: normocephalic; atraumatic; dry mucous membranes; poor dentition Eyes: Right pupil 3 mm and nonreactive, pseudophakia; left pupil pinpoint; extraocular muscles intact Neck: supple Heart: regular rate and rhythm Lungs: clear to auscultation bilaterally Abdomen: soft; nondistended; nontender; bowel sounds present Extremities: No deformity; full range of motion; pulses normal Neurologic: Awake, alert and oriented; motor function intact in all extremities and symmetric; no facial droop Skin: Warm and dry; scattered ecchymoses of various ages Psychiatric: Normal mood and affect   RESULTS  Summary of this visit's results, reviewed and interpreted by myself:  EKG Interpretation  Date/Time:  Saturday July 19 2020 22:31:55 EDT Ventricular Rate:  105 PR Interval:  150 QRS Duration: 114 QT Interval:  361 QTC Calculation: 478 R Axis:   6 Text Interpretation: Sinus tachycardia Borderline intraventricular conduction delay Repol abnrm suggests ischemia, lateral leads No significant change was found Confirmed by Shanon Rosser [phone]) on 07/19/2020 10:46:50 PM      Laboratory Studies: Results for orders placed or performed during the hospital encounter of 07/19/20 (from the past 24 hour(s))  Urinalysis, Routine w reflex microscopic Urine, Catheterized     Status: Abnormal   Collection Time: 07/19/20 10:40 PM  Result Value Ref Range   Color, Urine YELLOW YELLOW   APPearance CLEAR CLEAR   Specific Gravity, Urine 1.017 1.005 - 1.030   pH 6.0 5.0 - 8.0   Glucose, UA NEGATIVE NEGATIVE mg/dL   Hgb urine dipstick SMALL (A) NEGATIVE   Bilirubin Urine NEGATIVE NEGATIVE   Ketones, ur 80 (A) NEGATIVE mg/dL   Protein, ur 30 (A) NEGATIVE mg/dL   Nitrite NEGATIVE NEGATIVE   Leukocytes,Ua TRACE (A) NEGATIVE   RBC / HPF 0-5 0 - 5 RBC/hpf   WBC, UA 6-10 0 - 5 WBC/hpf   Bacteria, UA RARE (A) NONE SEEN   Mucus PRESENT   Basic metabolic panel      Status: Abnormal   Collection Time: 07/19/20 10:50 PM  Result Value Ref Range   Sodium 141 135 - 145 mmol/L   Potassium 2.9 (L) 3.5 - 5.1 mmol/L   Chloride 109 98 - 111 mmol/L   CO2 24 22 - 32 mmol/L   Glucose, Bld 170 (H) 70 - 99 mg/dL   BUN 14 8 - 23 mg/dL   Creatinine, Ser 0.65 0.44 - 1.00 mg/dL   Calcium 8.1 (L) 8.9 - 10.3 mg/dL   GFR, Estimated >60 >60 mL/min   Anion gap 8 5 - 15  CBC with Differential/Platelet     Status: Abnormal   Collection Time: 07/19/20 10:50 PM  Result Value Ref Range   WBC 7.4 4.0 - 10.5 K/uL   RBC 3.47 (L) 3.87 - 5.11 MIL/uL   Hemoglobin 11.6 (L) 12.0 - 15.0 g/dL   HCT 35.6 (L) 36.0 - 46.0 %   MCV 102.6 (H) 80.0 - 100.0 fL   MCH 33.4 26.0 - 34.0 pg   MCHC 32.6 30.0 - 36.0 g/dL   RDW 13.0 11.5 - 15.5 %   Platelets 260 150 - 400 K/uL   nRBC 0.0 0.0 - 0.2 %   Neutrophils Relative % 82 %   Neutro Abs 6.0 1.7 - 7.7 K/uL   Lymphocytes Relative 7 %   Lymphs Abs 0.5 (L) 0.7 - 4.0 K/uL   Monocytes Relative 11 %   Monocytes Absolute 0.8 0.1 - 1.0 K/uL   Eosinophils Relative 0 %   Eosinophils Absolute 0.0 0.0 - 0.5 K/uL   Basophils Relative 0 %   Basophils Absolute 0.0 0.0 - 0.1 K/uL   Immature Granulocytes 0 %   Abs Immature Granulocytes 0.02 0.00 - 0.07 K/uL  CK     Status: None   Collection Time: 07/19/20 10:50 PM  Result Value Ref Range   Total CK 39 38 - 234 U/L   Imaging Studies: No results found.  ED COURSE and MDM  Nursing notes, initial and subsequent vitals signs, including pulse oximetry, reviewed and interpreted by myself.  Vitals:   07/19/20 2305 07/19/20 2330 07/20/20 0000 07/20/20 0030  BP: (!) 165/89 (!) 163/99 [name])  175/97 (!) 158/88  Pulse: 98 99 (!) 102 95  Resp: 18 (!) 23 (!) 22 (!) 28  Temp:      SpO2: 97% 96% 100% 98%  Weight:      Height:       Medications  potassium chloride SA (KLOR-CON) CR tablet 40 mEq (40 mEq Oral Given 07/19/20 2350)  sodium chloride 0.9 % bolus 1,000 mL (1,000 mLs Intravenous New Bag/Given  07/19/20 2351)   12:41 AM There is no evidence of rhabdomyolysis based on total CK.  The patient does have some ecchymoses but no skin breakdown.  It is unclear how long she might actually have been on the ground.  EMS reports a poorly stocked refrigerator at home with partly eaten sandwiches.  They are concerned that the patient is unable to care for herself.  We will have her admitted with anticipation of placement.   PROCEDURES  Procedures   ED DIAGNOSES     ICD-10-CM   1. Failure to thrive in adult  R62.7   2. Hypokalemia  E87.6   3. Generalized weakness  R53.1   4. Macrocytosis without anemia  D75.89        Tashanna Dolin, MD 07/20/20 2231

## 2020-07-19 NOTE — ED Triage Notes (Signed)
Pt presents [facility] home via [facility], sister called out for well check on patient after not hearing from her since 4/14. Pt states she has been on the floor for 2 days and has been too weak to get up. Pt states she isn't sure if she fell, c/o generalized pain, worse in legs. Bruise noted to L buttock. Pt recently admitted for similar. Pt states she doesn't feel as though she can care for herself at home anymore.

## 2020-07-20 ENCOUNTER — Encounter (HOSPITAL_COMMUNITY): Payer: Self-pay | Admitting: Family Medicine

## 2020-07-20 DIAGNOSIS — Z20822 Contact with and (suspected) exposure to covid-19: Secondary | ICD-10-CM | POA: Diagnosis present

## 2020-07-20 DIAGNOSIS — C50812 Malignant neoplasm of overlapping sites of left female breast: Secondary | ICD-10-CM | POA: Diagnosis not present

## 2020-07-20 DIAGNOSIS — Z743 Need for continuous supervision: Secondary | ICD-10-CM | POA: Diagnosis not present

## 2020-07-20 DIAGNOSIS — Z9012 Acquired absence of left breast and nipple: Secondary | ICD-10-CM | POA: Diagnosis not present

## 2020-07-20 DIAGNOSIS — E46 Unspecified protein-calorie malnutrition: Secondary | ICD-10-CM | POA: Diagnosis present

## 2020-07-20 DIAGNOSIS — Z7189 Other specified counseling: Secondary | ICD-10-CM | POA: Diagnosis not present

## 2020-07-20 DIAGNOSIS — R531 Weakness: Secondary | ICD-10-CM

## 2020-07-20 DIAGNOSIS — D539 Nutritional anemia, unspecified: Secondary | ICD-10-CM | POA: Diagnosis present

## 2020-07-20 DIAGNOSIS — R824 Acetonuria: Secondary | ICD-10-CM | POA: Diagnosis present

## 2020-07-20 DIAGNOSIS — R278 Other lack of coordination: Secondary | ICD-10-CM | POA: Diagnosis not present

## 2020-07-20 DIAGNOSIS — F0281 Dementia in other diseases classified elsewhere with behavioral disturbance: Secondary | ICD-10-CM | POA: Diagnosis present

## 2020-07-20 DIAGNOSIS — E876 Hypokalemia: Secondary | ICD-10-CM | POA: Diagnosis present

## 2020-07-20 DIAGNOSIS — D509 Iron deficiency anemia, unspecified: Secondary | ICD-10-CM | POA: Diagnosis present

## 2020-07-20 DIAGNOSIS — Z853 Personal history of malignant neoplasm of breast: Secondary | ICD-10-CM | POA: Diagnosis not present

## 2020-07-20 DIAGNOSIS — R627 Adult failure to thrive: Secondary | ICD-10-CM | POA: Diagnosis present

## 2020-07-20 DIAGNOSIS — I10 Essential (primary) hypertension: Secondary | ICD-10-CM

## 2020-07-20 DIAGNOSIS — R000 Tachycardia, unspecified: Secondary | ICD-10-CM | POA: Diagnosis present

## 2020-07-20 DIAGNOSIS — Z8673 Personal history of transient ischemic attack (TIA), and cerebral infarction without residual deficits: Secondary | ICD-10-CM

## 2020-07-20 DIAGNOSIS — G301 Alzheimer's disease with late onset: Secondary | ICD-10-CM

## 2020-07-20 DIAGNOSIS — D519 Vitamin B12 deficiency anemia, unspecified: Secondary | ICD-10-CM | POA: Diagnosis present

## 2020-07-20 DIAGNOSIS — F332 Major depressive disorder, recurrent severe without psychotic features: Secondary | ICD-10-CM | POA: Diagnosis present

## 2020-07-20 DIAGNOSIS — F429 Obsessive-compulsive disorder, unspecified: Secondary | ICD-10-CM | POA: Diagnosis not present

## 2020-07-20 DIAGNOSIS — R404 Transient alteration of awareness: Secondary | ICD-10-CM | POA: Diagnosis not present

## 2020-07-20 DIAGNOSIS — R293 Abnormal posture: Secondary | ICD-10-CM | POA: Diagnosis not present

## 2020-07-20 DIAGNOSIS — G459 Transient cerebral ischemic attack, unspecified: Secondary | ICD-10-CM | POA: Diagnosis not present

## 2020-07-20 DIAGNOSIS — Z79810 Long term (current) use of selective estrogen receptor modulators (SERMs): Secondary | ICD-10-CM | POA: Diagnosis not present

## 2020-07-20 DIAGNOSIS — E860 Dehydration: Secondary | ICD-10-CM | POA: Diagnosis present

## 2020-07-20 DIAGNOSIS — R262 Difficulty in walking, not elsewhere classified: Secondary | ICD-10-CM | POA: Diagnosis not present

## 2020-07-20 DIAGNOSIS — E039 Hypothyroidism, unspecified: Secondary | ICD-10-CM

## 2020-07-20 DIAGNOSIS — Z950 Presence of cardiac pacemaker: Secondary | ICD-10-CM | POA: Diagnosis not present

## 2020-07-20 DIAGNOSIS — K219 Gastro-esophageal reflux disease without esophagitis: Secondary | ICD-10-CM | POA: Diagnosis not present

## 2020-07-20 DIAGNOSIS — Z6824 Body mass index (BMI) 24.0-24.9, adult: Secondary | ICD-10-CM | POA: Diagnosis not present

## 2020-07-20 DIAGNOSIS — Z170 Estrogen receptor positive status [ER+]: Secondary | ICD-10-CM | POA: Diagnosis not present

## 2020-07-20 DIAGNOSIS — R279 Unspecified lack of coordination: Secondary | ICD-10-CM | POA: Diagnosis not present

## 2020-07-20 DIAGNOSIS — K5900 Constipation, unspecified: Secondary | ICD-10-CM | POA: Diagnosis present

## 2020-07-20 DIAGNOSIS — Z923 Personal history of irradiation: Secondary | ICD-10-CM | POA: Diagnosis not present

## 2020-07-20 DIAGNOSIS — R4182 Altered mental status, unspecified: Secondary | ICD-10-CM | POA: Diagnosis not present

## 2020-07-20 DIAGNOSIS — Z515 Encounter for palliative care: Secondary | ICD-10-CM | POA: Diagnosis not present

## 2020-07-20 DIAGNOSIS — J449 Chronic obstructive pulmonary disease, unspecified: Secondary | ICD-10-CM | POA: Diagnosis present

## 2020-07-20 DIAGNOSIS — N182 Chronic kidney disease, stage 2 (mild): Secondary | ICD-10-CM | POA: Diagnosis not present

## 2020-07-20 DIAGNOSIS — I6350 Cerebral infarction due to unspecified occlusion or stenosis of unspecified cerebral artery: Secondary | ICD-10-CM | POA: Diagnosis not present

## 2020-07-20 DIAGNOSIS — F319 Bipolar disorder, unspecified: Secondary | ICD-10-CM | POA: Diagnosis not present

## 2020-07-20 LAB — CBC
HCT: 33 % — ABNORMAL LOW (ref 36.0–46.0)
Hemoglobin: 10.5 g/dL — ABNORMAL LOW (ref 12.0–15.0)
MCH: 33 pg (ref 26.0–34.0)
MCHC: 31.8 g/dL (ref 30.0–36.0)
MCV: 103.8 fL — ABNORMAL HIGH (ref 80.0–100.0)
Platelets: 272 10*3/uL (ref 150–400)
RBC: 3.18 MIL/uL — ABNORMAL LOW (ref 3.87–5.11)
RDW: 13.1 % (ref 11.5–15.5)
WBC: 7.2 10*3/uL (ref 4.0–10.5)
nRBC: 0 % (ref 0.0–0.2)

## 2020-07-20 LAB — URINALYSIS, ROUTINE W REFLEX MICROSCOPIC
Bilirubin Urine: NEGATIVE
Glucose, UA: NEGATIVE mg/dL
Ketones, ur: 80 mg/dL — AB
Nitrite: NEGATIVE
Protein, ur: 30 mg/dL — AB
Specific Gravity, Urine: 1.017 (ref 1.005–1.030)
pH: 6 (ref 5.0–8.0)

## 2020-07-20 LAB — VITAMIN B12: Vitamin B-12: 67 pg/mL — ABNORMAL LOW (ref 180–914)

## 2020-07-20 LAB — BASIC METABOLIC PANEL
Anion gap: 8 (ref 5–15)
BUN: 13 mg/dL (ref 8–23)
CO2: 22 mmol/L (ref 22–32)
Calcium: 8.3 mg/dL — ABNORMAL LOW (ref 8.9–10.3)
Chloride: 115 mmol/L — ABNORMAL HIGH (ref 98–111)
Creatinine, Ser: 0.63 mg/dL (ref 0.44–1.00)
GFR, Estimated: >60 mL/min (ref >60)
Glucose, Bld: 118 mg/dL — ABNORMAL HIGH (ref 70–99)
Potassium: 3.6 mmol/L (ref 3.5–5.1)
Sodium: 145 mmol/L (ref 135–145)

## 2020-07-20 LAB — SARS CORONAVIRUS 2 (TAT 6-24 HRS): SARS Coronavirus 2: NEGATIVE

## 2020-07-20 LAB — FOLATE: Folate: 16.3 ng/mL (ref >5.9)

## 2020-07-20 LAB — MAGNESIUM: Magnesium: 2.4 mg/dL (ref 1.7–2.4)

## 2020-07-20 LAB — AMMONIA: Ammonia: 23 umol/L (ref 9–35)

## 2020-07-20 MED ORDER — ACETAMINOPHEN 650 MG RE SUPP: 650.0000 mg | Freq: Four times a day (QID) | RECTAL | Status: DC | PRN

## 2020-07-20 MED ORDER — LORAZEPAM 1 MG PO TABS
1.0000 mg | ORAL_TABLET | Freq: Every day | ORAL | Status: DC
  Administered 2020-07-20 – 2020-07-28 (×9): 1 mg via ORAL
  Filled 2020-07-20 (×10): qty 1

## 2020-07-20 MED ORDER — AMLODIPINE BESYLATE 5 MG PO TABS
5.0000 mg | ORAL_TABLET | Freq: Every day | ORAL | Status: DC
  Administered 2020-07-20 – 2020-07-29 (×10): 5 mg via ORAL
  Filled 2020-07-20 (×10): qty 1

## 2020-07-20 MED ORDER — TAMOXIFEN CITRATE 10 MG PO TABS
20.0000 mg | ORAL_TABLET | Freq: Every day | ORAL | Status: DC
  Administered 2020-07-20 – 2020-07-29 (×10): 20 mg via ORAL
  Filled 2020-07-20 (×10): qty 2

## 2020-07-20 MED ORDER — ENOXAPARIN SODIUM 40 MG/0.4ML ~~LOC~~ SOLN
40.0000 mg | SUBCUTANEOUS | Status: DC
  Administered 2020-07-20 – 2020-07-29 (×10): 40 mg via SUBCUTANEOUS
  Filled 2020-07-20 (×10): qty 0.4

## 2020-07-20 MED ORDER — MAGNESIUM SULFATE IN D5W 1-5 GM/100ML-% IV SOLN
1.0000 g | Freq: Once | INTRAVENOUS | Status: AC
Start: 2020-07-20 — End: 2020-07-20
  Administered 2020-07-20: 1 g via INTRAVENOUS
  Filled 2020-07-20: qty 100

## 2020-07-20 MED ORDER — CYANOCOBALAMIN 1000 MCG/ML IJ SOLN
1000.0000 ug | Freq: Every day | INTRAMUSCULAR | Status: DC
  Administered 2020-07-20 – 2020-07-29 (×10): 1000 ug via INTRAMUSCULAR
  Filled 2020-07-20 (×10): qty 1

## 2020-07-20 MED ORDER — LEVOTHYROXINE SODIUM 150 MCG PO TABS
150.0000 ug | ORAL_TABLET | Freq: Every day | ORAL | Status: DC
  Administered 2020-07-20 – 2020-07-29 (×10): 150 ug via ORAL
  Filled 2020-07-20 (×9): qty 1

## 2020-07-20 MED ORDER — TRAZODONE HCL 50 MG PO TABS
50.0000 mg | ORAL_TABLET | Freq: Every evening | ORAL | Status: DC | PRN
  Administered 2020-07-20 – 2020-07-25 (×3): 50 mg via ORAL
  Filled 2020-07-20 (×3): qty 1

## 2020-07-20 MED ORDER — POTASSIUM CHLORIDE IN NACL 20-0.9 MEQ/L-% IV SOLN
INTRAVENOUS | Status: DC
  Filled 2020-07-20: qty 1000

## 2020-07-20 MED ORDER — ASPIRIN 325 MG PO TABS
325.0000 mg | ORAL_TABLET | Freq: Every day | ORAL | Status: DC
  Administered 2020-07-20 – 2020-07-29 (×10): 325 mg via ORAL
  Filled 2020-07-20 (×10): qty 1

## 2020-07-20 MED ORDER — ACETAMINOPHEN 325 MG PO TABS
650.0000 mg | ORAL_TABLET | Freq: Four times a day (QID) | ORAL | Status: DC | PRN
  Administered 2020-07-27: 650 mg via ORAL
  Filled 2020-07-20: qty 2

## 2020-07-20 MED ORDER — SENNOSIDES-DOCUSATE SODIUM 8.6-50 MG PO TABS
1.0000 | ORAL_TABLET | Freq: Every evening | ORAL | Status: DC | PRN
  Administered 2020-07-21: 1 via ORAL
  Filled 2020-07-20: qty 1

## 2020-07-20 MED ORDER — KCL IN DEXTROSE-NACL 20-5-0.45 MEQ/L-%-% IV SOLN
INTRAVENOUS | Status: DC
  Filled 2020-07-20 (×6): qty 1000

## 2020-07-20 MED ORDER — ESCITALOPRAM OXALATE 20 MG PO TABS
20.0000 mg | ORAL_TABLET | Freq: Every day | ORAL | Status: DC
  Administered 2020-07-20 – 2020-07-29 (×10): 20 mg via ORAL
  Filled 2020-07-20 (×10): qty 1

## 2020-07-20 NOTE — ED Notes (Signed)
ED TO INPATIENT HANDOFF REPORT  Name/Age/Gender [name] 75 y.o. female  Code Status    Code Status Orders  (From admission, onward)         Start     Ordered   07/20/20 0117  Full code  Continuous        07/20/20 0117        Code Status History    Date Active Date Inactive Code Status Order ID Comments User Context   07/11/2020 2355 07/16/2020 1732 Full Code [id number]  Ileene Musa T, DO ED   05/13/2017 1156 05/14/2017 1344 Full Code [id number]  Excell Seltzer, MD Inpatient   11/25/2015 2052 11/26/2015 1350 Full Code [id number]  Waynetta Pean, PA-C ED   10/08/2015 1756 10/08/2015 2202 Full Code [id number]  Ezequiel Essex, MD ED   04/19/2015 2049 04/21/2015 1415 Full Code [id number]  Marella Chimes, PA-C ED   03/31/2015 2135 04/01/2015 0240 Full Code [id number]  Quintella Reichert, MD ED   06/13/2014 1158 06/15/2014 1612 Full Code [id number]  Coral Spikes, DO Inpatient   09/26/2013 0009 09/26/2013 1436 Full Code [id number]  Beverely Pace ED   Advance Care Planning Activity      Home/SNF/Other From home, needs case management   Chief Complaint General weakness [R53.1]  Level of Care/Admitting Diagnosis ED Disposition    ED Disposition Condition [facility]: [facility] [[id number]]  Level of Care: Med-Surg [16]  Covid Evaluation: Asymptomatic Screening Protocol (No Symptoms)  Diagnosis: General weakness [470962]  Admitting Physician: Vianne Bulls [[id number]]  Attending Physician: Vianne Bulls [[id number]]       Medical History Past Medical History:  Diagnosis Date  . Anxiety   . Arthritis   . Cancer [facility])    breast cancer  . COPD (chronic obstructive pulmonary disease) (HCC)    Due to long history of tobacco abuse, still smoking  . Depression   . Diverticulosis of colon   . GERD (gastroesophageal reflux disease)   . Heart murmur    not sure never been evaluated  . History of uterine prolapse 10/2004   Dr Cletis Media  .  Hypertension    not on any medications  . Hypothyroidism   . OCD (obsessive compulsive disorder)   . Osteoporosis   . PUD (peptic ulcer disease) 09/2006   H pylori  . Thyroid disease    Ho R thyroid noduel plus mild hyperthyroidism. Treated with radioiodine therapty on 04/2006. Dr Estrella Myrtle.    Allergies Allergies  Allergen Reactions  . Seroquel [Quetiapine Fumarate] Other (See Comments)    Reaction:  Drowsiness   . Amoxicillin Nausea Only and Other (See Comments)    Has patient had a PCN reaction causing immediate rash, facial/tongue/throat swelling, SOB or lightheadedness with hypotension: No Has patient had a PCN reaction causing severe rash involving mucus membranes or skin necrosis: No Has patient had a PCN reaction that required hospitalization No Has patient had a PCN reaction occurring within the last 10 years: No If all of the above answers are "NO", then may proceed with Cephalosporin use.  . Ciprofloxacin Other (See Comments)    Reaction:  Unknown   . Haloperidol Lactate Other (See Comments)    Reaction:  Arm stiffness   . Ketorolac Tromethamine Other (See Comments)    Reaction:  Unknown   . Latuda [Lurasidone Hcl] Other (See Comments)    Pt states that this medication makes her feel "weird".    [name]  Thorazine [Chlorpromazine] Other (See Comments)    Reaction:  Unknown   . Wellbutrin [Bupropion] Other (See Comments)    Pt states that this medication makes her feel "weird".      IV Location/Drains/Wounds Patient Lines/Drains/Airways Status    Active Line/Drains/Airways    Name Placement date Placement time Site Days   Peripheral IV 07/19/20 Right Forearm 07/19/20  2250  Forearm  1   Closed System Drain 1 Left Breast Bulb (JP) 19 Fr. 05/13/17  0847  Breast  1164   Incision (Closed) 02/21/15 Foot Right 02/21/15  1218  -- 1976   Incision (Closed) 05/13/17 Breast Left 05/13/17  0855  -- 1164          Labs/Imaging Results for orders placed or performed during the  hospital encounter of 07/19/20 (from the past 48 hour(s))  Urinalysis, Routine w reflex microscopic Urine, Catheterized     Status: Abnormal   Collection Time: 07/19/20 10:40 PM  Result Value Ref Range   Color, Urine YELLOW YELLOW   APPearance CLEAR CLEAR   Specific Gravity, Urine 1.017 1.005 - 1.030   pH 6.0 5.0 - 8.0   Glucose, UA NEGATIVE NEGATIVE mg/dL   Hgb urine dipstick SMALL (A) NEGATIVE   Bilirubin Urine NEGATIVE NEGATIVE   Ketones, ur 80 (A) NEGATIVE mg/dL   Protein, ur 30 (A) NEGATIVE mg/dL   Nitrite NEGATIVE NEGATIVE   Leukocytes,Ua TRACE (A) NEGATIVE   RBC / HPF 0-5 0 - 5 RBC/hpf   WBC, UA 6-10 0 - 5 WBC/hpf   Bacteria, UA RARE (A) NONE SEEN   Mucus PRESENT     Comment: Performed at [facility], [place] [address] [place], [place] [address]  Basic metabolic panel     Status: Abnormal   Collection Time: 07/19/20 10:50 PM  Result Value Ref Range   Sodium 141 135 - 145 mmol/L   Potassium 2.9 (L) 3.5 - 5.1 mmol/L   Chloride 109 98 - 111 mmol/L   CO2 24 22 - 32 mmol/L   Glucose, Bld 170 (H) 70 - 99 mg/dL    Comment: Glucose reference range applies only to samples taken after fasting for at least 8 hours.   BUN 14 8 - 23 mg/dL   Creatinine, Ser 0.65 0.44 - 1.00 mg/dL   Calcium 8.1 (L) 8.9 - 10.3 mg/dL   GFR, Estimated >60 >60 mL/min    Comment: (NOTE) Calculated using the CKD-EPI Creatinine Equation (2021)    Anion gap 8 5 - 15    Comment: Performed at [facility], [place] [address], [place], [place] [address]  CBC with Differential/Platelet     Status: Abnormal   Collection Time: 07/19/20 10:50 PM  Result Value Ref Range   WBC 7.4 4.0 - 10.5 K/uL   RBC 3.47 (L) 3.87 - 5.11 MIL/uL   Hemoglobin 11.6 (L) 12.0 - 15.0 g/dL   HCT 35.6 (L) 36.0 - 46.0 %   MCV 102.6 (H) 80.0 - 100.0 fL   MCH 33.4 26.0 - 34.0 pg   MCHC 32.6 30.0 - 36.0 g/dL   RDW 13.0 11.5 - 15.5 %   Platelets 260 150 - 400 K/uL   nRBC 0.0 0.0 - 0.2 %   Neutrophils  Relative % 82 %   Neutro Abs 6.0 1.7 - 7.7 K/uL   Lymphocytes Relative 7 %   Lymphs Abs 0.5 (L) 0.7 - 4.0 K/uL   Monocytes Relative 11 %   Monocytes Absolute 0.8 0.1 -  1.0 K/uL   Eosinophils Relative 0 %   Eosinophils Absolute 0.0 0.0 - 0.5 K/uL   Basophils Relative 0 %   Basophils Absolute 0.0 0.0 - 0.1 K/uL   Immature Granulocytes 0 %   Abs Immature Granulocytes 0.02 0.00 - 0.07 K/uL    Comment: Performed at [facility], [place] [address] [place], [place] [address]  CK     Status: None   Collection Time: 07/19/20 10:50 PM  Result Value Ref Range   Total CK 39 38 - 234 U/L    Comment: Performed at [facility], [place] [address] [place], [place] [address]   No results found.  Pending Labs Unresulted Labs (From admission, onward)          Start     Ordered   07/27/20 0500  Creatinine, serum  (enoxaparin (LOVENOX)    CrCl >/= 30 ml/min)  Weekly,   R     Comments: while on enoxaparin therapy    07/20/20 0117   07/20/20 2080  Basic metabolic panel  Tomorrow morning,   R        07/20/20 0117   07/20/20 0500  Magnesium  Tomorrow morning,   R        07/20/20 0117   07/20/20 0500  CBC  Tomorrow morning,   R        07/20/20 0117   07/20/20 0500  Vitamin B12  Tomorrow morning,   R        07/20/20 0120   07/20/20 0500  Folate, serum, performed at [facility] lab  Tomorrow morning,   R        07/20/20 0120   07/20/20 0500  RPR  Tomorrow morning,   R        07/20/20 0120   07/20/20 0500  Ammonia  Tomorrow morning,   R        07/20/20 0120   07/20/20 0043  SARS CORONAVIRUS 2 (TAT 6-24 HRS) Nasopharyngeal Nasopharyngeal Swab  (Tier 3 - Symptomatic/asymptomatic)  Once,   STAT       Question Answer Comment  Is this test for diagnosis or screening Screening   Symptomatic for COVID-19 as defined by CDC No   Hospitalized for COVID-19 No   Admitted to ICU for COVID-19 No   Previously tested for COVID-19 Unknown   Resident in a congregate (group) care  setting No   Employed in healthcare setting No   Pregnant No   Has patient completed COVID vaccination(s) (2 doses of Pfizer/Moderna 1 dose of [employer]) No      07/20/20 0044          Vitals/Pain Today's Vitals   07/20/20 0000 07/20/20 0030 07/20/20 0130 07/20/20 0200  BP: (!) 175/97 (!) 158/88 (!) 155/94 (!) 168/87  Pulse: (!) 102 95 99 98  Resp: (!) 22 (!) 28 (!) 24 (!) 25  Temp:      SpO2: 100% 98% 99% 98%  Weight:      Height:      PainSc:        Isolation Precautions No active isolations  Medications Medications  magnesium sulfate IVPB 1 g 100 mL (1 g Intravenous New Bag/Given 07/20/20 0210)  aspirin tablet 325 mg (has no administration in time range)  tamoxifen (NOLVADEX) tablet 20 mg (has no administration in time range)  amLODipine (NORVASC) tablet 5 mg (has no administration in time range)  escitalopram (LEXAPRO) tablet 20 mg (has no administration  in time range)  levothyroxine (SYNTHROID) tablet 150 mcg (has no administration in time range)  enoxaparin (LOVENOX) injection 40 mg (has no administration in time range)  0.9 % NaCl with KCl 20 mEq/ L  infusion (has no administration in time range)  acetaminophen (TYLENOL) tablet 650 mg (has no administration in time range)    Or  acetaminophen (TYLENOL) suppository 650 mg (has no administration in time range)  senna-docusate (Senokot-S) tablet 1 tablet (has no administration in time range)  potassium chloride SA (KLOR-CON) CR tablet 40 mEq (40 mEq Oral Given 07/19/20 2350)  sodium chloride 0.9 % bolus 1,000 mL (0 mLs Intravenous Stopped 07/20/20 0215)    Mobility Uses a walker at home

## 2020-07-20 NOTE — H&P (Signed)
History and Physical    [name] PPJ:[id number] DOB: 1946/03/06 DOA: 07/19/2020  PCP: Shary Key, DO   Patient coming from: Home   Chief Complaint: General weakness and fatigue, found on floor   HPI: [name] is a 75 y.o. female with medical history significant for history of CVA, breast cancer, dementia, depression, hypothyroidism, recently was started on antihypertensives, and now presenting to the emergency department with general weakness and fatigue.  Patient was recently admitted to the hospital with dizziness, nausea, and vomiting, had no acute findings on MRI brain, was discharged to an SNF, but reportedly taken out by family a couple days later and has since been living alone again.  Family was unable to reach her and contacted EMS who found the patient to be on the floor and too weak to get up.  Patient does not recall how she ended up on the floor but does not believe that she hit her head or loss consciousness.  She denies any headache or focal numbness or weakness.  She has some neck pain that has been going on for close to 1 month and was evaluated on CT during recent admission.  She does not have any acute complaints but acknowledges that she is too weak and fatigued to care for herself.   ED Course: Upon arrival to the ED, patient is found to be afebrile, saturating well on room air, and with stable blood pressure.  EKG features sinus tachycardia with rate 105.  Urinalysis with ketonuria.  Chemistry panel notable for potassium 2.9.  CBC with mild macrocytic anemia.  Patient was given IV fluids and potassium in the ED.  Review of Systems:  All other systems reviewed and apart from HPI, are negative.  Past Medical History:  Diagnosis Date  . Anxiety   . Arthritis   . Cancer [facility])    breast cancer  . COPD (chronic obstructive pulmonary disease) (HCC)    Due to long history of tobacco abuse, still smoking  . Depression   . Diverticulosis of colon   . GERD  (gastroesophageal reflux disease)   . Heart murmur    not sure never been evaluated  . History of uterine prolapse 10/2004   Dr Cletis Media  . Hypertension    not on any medications  . Hypothyroidism   . OCD (obsessive compulsive disorder)   . Osteoporosis   . PUD (peptic ulcer disease) 09/2006   H pylori  . Thyroid disease    Ho R thyroid noduel plus mild hyperthyroidism. Treated with radioiodine therapty on 04/2006. Dr Estrella Myrtle.    Past Surgical History:  Procedure Laterality Date  . ABDOMINAL HYSTERECTOMY    . BIOPSY THYROID  08/2008   Atypia and Hurtle cells, partial thyroidectomy   . BLADDER SUSPENSION     mesh sling  . Carotid ultrasound  09/10/09   No significant extracranial carotid artery stenosis, vertebrals are patent with antegrade flow.   [name] CATARACT EXTRACTION Right   . COLONOSCOPY  06/03/2005   Hyperplastic polyps, sigmoid diverticulosis, internal hemorroids  . EYE SURGERY     laser surgery does not know which eye  . FOREIGN BODY REMOVAL Right 02/21/2015   Procedure: RIGHT FOOT FOREIGN BODY REMOVAL ;  Surgeon: Marchia Bond, MD;  Location: [facility];  Service: Orthopedics;  Laterality: Right;  . MASTECTOMY COMPLETE / SIMPLE W/ SENTINEL NODE BIOPSY Left 05/13/2017  . MASTECTOMY W/ SENTINEL NODE BIOPSY Left 05/13/2017   Procedure: LEFT TOTAL MASTECTOMY WITH  AXILLARY SENTINEL LYMPH NODE BIOPSY;  Surgeon: Excell Seltzer, MD;  Location: [facility];  Service: General;  Laterality: Left;  . MRI Head  09/20/09   No acute intracranial abn.  Signal abnormality suggestive of chronic small vessel ischemia, maximal in the right subinsular white and deep gray mater.   [name] PFT  11/2006   Obstructive pattern.  Poor response to bronchodilators.   . THYROIDECTOMY, PARTIAL  08/2008   Dr Ronnald Collum  . TONSILLECTOMY AND ADENOIDECTOMY  10/2004  . TRANSTHORACIC ECHOCARDIOGRAM  07/05/7060   Normal systolic function.  EF 55-60%.  Mild MR, atria ok, trivial pericardial effusion  . TRANSVAGINAL  TAPE (TVT) REMOVAL  06/15/2010   Dr Mancel Bale, for urge incontinence    Social History:   reports that she quit smoking about 3 years ago. Her smoking use included cigarettes. She has a 30.00 pack-year smoking history. She has never used smokeless tobacco. She reports that she does not drink alcohol and does not use drugs.  Allergies  Allergen Reactions  . Seroquel [Quetiapine Fumarate] Other (See Comments)    Reaction:  Drowsiness   . Amoxicillin Nausea Only and Other (See Comments)    Has patient had a PCN reaction causing immediate rash, facial/tongue/throat swelling, SOB or lightheadedness with hypotension: No Has patient had a PCN reaction causing severe rash involving mucus membranes or skin necrosis: No Has patient had a PCN reaction that required hospitalization No Has patient had a PCN reaction occurring within the last 10 years: No If all of the above answers are "NO", then may proceed with Cephalosporin use.  . Ciprofloxacin Other (See Comments)    Reaction:  Unknown   . Haloperidol Lactate Other (See Comments)    Reaction:  Arm stiffness   . Ketorolac Tromethamine Other (See Comments)    Reaction:  Unknown   . Latuda [Lurasidone Hcl] Other (See Comments)    Pt states that this medication makes her feel "weird".    . Thorazine [Chlorpromazine] Other (See Comments)    Reaction:  Unknown   . Wellbutrin [Bupropion] Other (See Comments)    Pt states that this medication makes her feel "weird".      Family History  Problem Relation Age of Onset  . Heart disease Mother   . Stomach cancer Mother   . Cirrhosis Sister   . Hypertension Sister   . Heart disease Maternal Grandmother   . Heart disease Maternal Grandfather   . Colon cancer Maternal Aunt   . Other Neg Hx      Prior to Admission medications   Medication Sig Start Date End Date Taking? Authorizing Provider  amLODipine (NORVASC) 5 MG tablet Take 1 tablet (5 mg total) by mouth daily. 07/16/20 07/16/21  Terrilee Croak, MD  aspirin 325 MG tablet Take 325 mg by mouth daily.    [provider]  escitalopram (LEXAPRO) 20 MG tablet TAKE 1 TABLET BY MOUTH EVERY DAY 06/23/20   Nicholas Lose, MD  levothyroxine (SYNTHROID) 125 MCG tablet Take 1 tablet (125 mcg total) by mouth every morning. 30 minutes before food 06/14/20   Shary Key, DO  loperamide (IMODIUM) 2 MG capsule Take by mouth as needed for diarrhea or loose stools (as needed for diarrhea).    [provider]  promethazine (PHENERGAN) 12.5 MG tablet Take 1 tablet (12.5 mg total) by mouth every 8 (eight) hours as needed for nausea or vomiting. 01/28/20   Shary Key, DO  tamoxifen (NOLVADEX) 20 MG tablet Take 1  tablet (20 mg total) by mouth daily. 03/25/20   Nicholas Lose, MD  traZODone (DESYREL) 50 MG tablet TAKE 1/2 TO 1 TABLET AT BEDTIME AS NEEDED FOR SLEEP 07/10/18 06/15/19  Guadalupe Dawn, MD    Physical Exam: Vitals:   07/19/20 2305 07/19/20 2330 07/20/20 0000 07/20/20 0030  BP: (!) 165/89 (!) 163/99 (!) 175/97 (!) 158/88  Pulse: 98 99 (!) 102 95  Resp: 18 (!) 23 (!) 22 (!) 28  Temp:      SpO2: 97% 96% 100% 98%  Weight:      Height:        Constitutional: NAD, calm  Eyes: PERTLA, lids and conjunctivae normal ENMT: Mucous membranes are dry. Posterior pharynx clear of any exudate or lesions.   Neck:supple, no masses  Respiratory:  no wheezing, no crackles. No accessory muscle use.  Cardiovascular: S1 & S2 heard, regular rate and rhythm. No extremity edema.  Abdomen: No distension, no tenderness, soft. Bowel sounds active.  Musculoskeletal: no clubbing / cyanosis. No joint deformity upper and lower extremities.   Skin: Ecchymoses about the extretmities. Poor turgor. Warm, dry, well-perfused. Neurologic: CN 2-12 grossly intact. Sensation intact. Moving all extremities.  Psychiatric: Sleeping, wakes to voice. Oriented to person, place, and situation. Calm and cooperative.    Labs and Imaging on Admission: I  have personally reviewed following labs and imaging studies  CBC: Recent Labs  Lab 07/13/20 0639 07/14/20 0910 07/15/20 1244 07/19/20 2250  WBC 4.1 4.5 5.5 7.4  NEUTROABS  --   --   --  6.0  HGB 11.2* 11.2* 11.3* 11.6*  HCT 35.7* 35.3* 34.7* 35.6*  MCV 105.3* 104.1* 104.5* 102.6*  PLT 180 177 184 878   Basic Metabolic Panel: Recent Labs  Lab 07/13/20 0639 07/14/20 0910 07/15/20 1244 07/19/20 2250  NA 143 139 140 141  K 4.0 3.9 3.6 2.9*  CL 111 110 110 109  CO2 24 24 24 24   GLUCOSE 113* 99 117* 170*  BUN 20 13 13 14   CREATININE 0.80 0.70 0.65 0.65  CALCIUM 8.5* 8.1* 8.2* 8.1*   GFR: Estimated Creatinine Clearance: 41.3 mL/min (by C-G formula based on SCr of 0.65 mg/dL). Liver Function Tests: Recent Labs  Lab 07/13/20 0639 07/14/20 0910 07/15/20 1244  AST 20 19 15   ALT 13 11 9   ALKPHOS 354* 345* 320*  BILITOT 0.4 0.6 0.6  PROT 5.3* 5.4* 5.1*  ALBUMIN 3.0* 3.1* 2.8*   No results for input(s): LIPASE, AMYLASE in the last 168 hours. No results for input(s): AMMONIA in the last 168 hours. Coagulation Profile: No results for input(s): INR, PROTIME in the last 168 hours. Cardiac Enzymes: Recent Labs  Lab 07/19/20 2250  CKTOTAL 39   BNP (last 3 results) No results for input(s): PROBNP in the last 8760 hours. HbA1C: No results for input(s): HGBA1C in the last 72 hours. CBG: No results for input(s): GLUCAP in the last 168 hours. Lipid Profile: No results for input(s): CHOL, HDL, LDLCALC, TRIG, CHOLHDL, LDLDIRECT in the last 72 hours. Thyroid Function Tests: No results for input(s): TSH, T4TOTAL, FREET4, T3FREE, THYROIDAB in the last 72 hours. Anemia Panel: No results for input(s): VITAMINB12, FOLATE, FERRITIN, TIBC, IRON, RETICCTPCT in the last 72 hours. Urine analysis:    Component Value Date/Time   COLORURINE YELLOW 07/19/2020 2240   APPEARANCEUR CLEAR 07/19/2020 2240   LABSPEC 1.017 07/19/2020 2240   PHURINE 6.0 07/19/2020 2240   GLUCOSEU NEGATIVE  07/19/2020 2240   HGBUR SMALL (A) 07/19/2020 2240  HGBUR negative 04/03/2010 1401   BILIRUBINUR NEGATIVE 07/19/2020 2240   BILIRUBINUR NEG 11/13/2014 1455   KETONESUR 80 (A) 07/19/2020 2240   PROTEINUR 30 (A) 07/19/2020 2240   UROBILINOGEN 0.2 11/16/2014 1204   NITRITE NEGATIVE 07/19/2020 2240   LEUKOCYTESUR TRACE (A) 07/19/2020 2240   Sepsis Labs: @LABRCNTIP (procalcitonin:4,lacticidven:4) ) Recent Results (from the past 240 hour(s))  Resp Panel by RT-PCR (Flu A&B, Covid) Nasopharyngeal Swab     Status: None   Collection Time: 07/11/20  8:27 PM   Specimen: Nasopharyngeal Swab; Nasopharyngeal(NP) swabs in vial transport medium  Result Value Ref Range Status   SARS Coronavirus 2 by RT PCR NEGATIVE NEGATIVE Final    Comment: (NOTE) SARS-CoV-2 target nucleic acids are NOT DETECTED.  The SARS-CoV-2 RNA is generally detectable in upper respiratory specimens during the acute phase of infection. The lowest concentration of SARS-CoV-2 viral copies this assay can detect is 138 copies/mL. A negative result does not preclude SARS-Cov-2 infection and should not be used as the sole basis for treatment or other patient management decisions. A negative result may occur with  improper specimen collection/handling, submission of specimen other than nasopharyngeal swab, presence of viral mutation(s) within the areas targeted by this assay, and inadequate number of viral copies(<138 copies/mL). A negative result must be combined with clinical observations, patient history, and epidemiological information. The expected result is Negative.  Fact Sheet for Patients:  EntrepreneurPulse.com.au  Fact Sheet for Healthcare Providers:  IncredibleEmployment.be  This test is no t yet approved or cleared by the [place] FDA and  has been authorized for detection and/or diagnosis of SARS-CoV-2 by FDA under an Emergency Use Authorization (EUA). This EUA will remain   in effect (meaning this test can be used) for the duration of the COVID-19 declaration under Section 564(b)(1) of the Act, 21 U.S.C.section 360bbb-3(b)(1), unless the authorization is terminated  or revoked sooner.       Influenza A by PCR NEGATIVE NEGATIVE Final   Influenza B by PCR NEGATIVE NEGATIVE Final    Comment: (NOTE) The Xpert Xpress SARS-CoV-2/FLU/RSV plus assay is intended as an aid in the diagnosis of influenza from Nasopharyngeal swab specimens and should not be used as a sole basis for treatment. Nasal washings and aspirates are unacceptable for Xpert Xpress SARS-CoV-2/FLU/RSV testing.  Fact Sheet for Patients: EntrepreneurPulse.com.au  Fact Sheet for Healthcare Providers: IncredibleEmployment.be  This test is not yet approved or cleared by the [place] FDA and has been authorized for detection and/or diagnosis of SARS-CoV-2 by FDA under an Emergency Use Authorization (EUA). This EUA will remain in effect (meaning this test can be used) for the duration of the COVID-19 declaration under Section 564(b)(1) of the Act, 21 U.S.C. section 360bbb-3(b)(1), unless the authorization is terminated or revoked.  Performed at [facility], [place] [address], [place], [place] [address]   SARS CORONAVIRUS 2 (TAT 6-24 HRS) Nasopharyngeal Nasopharyngeal Swab     Status: None   Collection Time: 07/15/20 10:32 AM   Specimen: Nasopharyngeal Swab  Result Value Ref Range Status   SARS Coronavirus 2 NEGATIVE NEGATIVE Final    Comment: (NOTE) SARS-CoV-2 target nucleic acids are NOT DETECTED.  The SARS-CoV-2 RNA is generally detectable in upper and lower respiratory specimens during the acute phase of infection. Negative results do not preclude SARS-CoV-2 infection, do not rule out co-infections with other pathogens, and should not be used as the sole basis for treatment or other patient management decisions. Negative  results must be combined with clinical  observations, patient history, and epidemiological information. The expected result is Negative.  Fact Sheet for Patients: SugarRoll.be  Fact Sheet for Healthcare Providers: https://www.woods-mathews.com/  This test is not yet approved or cleared by the [place] FDA and  has been authorized for detection and/or diagnosis of SARS-CoV-2 by FDA under an Emergency Use Authorization (EUA). This EUA will remain  in effect (meaning this test can be used) for the duration of the COVID-19 declaration under Se ction 564(b)(1) of the Act, 21 U.S.C. section 360bbb-3(b)(1), unless the authorization is terminated or revoked sooner.  Performed at [facility], [place] [address] [place], [place] [address]      Radiological Exams on Admission: No results found.  EKG: Independently reviewed. Sinus tachycardia, repolarization abnormality, QTc 478, similar to prior.   Assessment/Plan   1. General weakness  - Presents with ongoing general weakness and inability to care for self, found by EMS to be on floor and too weak to get up  - She was discharge to SNF on 4/13 but was taken out by family a couple days later and has been living alone again  - No acute change in her condition from recent admission when she had negative MRI brain  - TSH was 6.249 a week ago and Synthroid was increased  - Check B12 level given macrocytosis, check ammonia, replace potassium, continue supportive care, consult TOC   2. Depression; dementia  - Calm and cooperative in ED  - Continue Lexapro  - Delirium precautions   3. History of CVA  - No acute finding on MRI brain one week ago  - Continue ASA    4. Hypothyroidism  - TSH was elevated during recent admission and Synthroid increased  - Continue Synthroid, repeat TSH in another 3-5 weeks    5. Hypertension  - Continue Norvasc    6. Breast cancer  - Continue tamoxifen,  continue follow-up next month as planned   7. Sclerotic bone lesions  - Noted incidentally on CTA earlier this month  - Possibly metastatic disease, oncology recommended follow-up in clinic next month   8. Hypokalemia  - Serum potassium 2.9 in ED  - Replace potassium, give 1 g magnesium empirically, repeat chem panel in am     DVT prophylaxis: Lovenox  Code Status: Full  Level of Care: Level of care: Med-Surg Family Communication: Unable to reach daughter or sister by phone   Disposition Plan:  Patient is from: home  Anticipated d/c is to: TBD Anticipated d/c date is: 07/21/20 Patient currently: Pending safe discharge plan  Consults called: none  Admission status: Observation     Vianne Bulls, MD [facility]  07/20/2020, 1:38 AM

## 2020-07-20 NOTE — Progress Notes (Signed)
PT Cancellation Note  Patient Details Name: [name] MRN: [id number] DOB: 18-Apr-1945   Cancelled Treatment:     PT order received but eval deferred this date.  Pt stating too fatigued and is not getting out of bed.  Will follow.   [name] 07/20/2020, 2:15 PM
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
## 2020-07-21 DIAGNOSIS — R531 Weakness: Secondary | ICD-10-CM | POA: Diagnosis not present

## 2020-07-21 DIAGNOSIS — J449 Chronic obstructive pulmonary disease, unspecified: Secondary | ICD-10-CM | POA: Diagnosis not present

## 2020-07-21 DIAGNOSIS — I10 Essential (primary) hypertension: Secondary | ICD-10-CM | POA: Diagnosis not present

## 2020-07-21 DIAGNOSIS — Z8673 Personal history of transient ischemic attack (TIA), and cerebral infarction without residual deficits: Secondary | ICD-10-CM | POA: Diagnosis not present

## 2020-07-21 LAB — RPR: RPR Ser Ql: NONREACTIVE

## 2020-07-21 LAB — BASIC METABOLIC PANEL
Anion gap: 6 (ref 5–15)
BUN: 9 mg/dL (ref 8–23)
CO2: 23 mmol/L (ref 22–32)
Calcium: 7.8 mg/dL — ABNORMAL LOW (ref 8.9–10.3)
Chloride: 111 mmol/L (ref 98–111)
Creatinine, Ser: 0.53 mg/dL (ref 0.44–1.00)
GFR, Estimated: >60 mL/min (ref >60)
Glucose, Bld: 137 mg/dL — ABNORMAL HIGH (ref 70–99)
Potassium: 4.1 mmol/L (ref 3.5–5.1)
Sodium: 140 mmol/L (ref 135–145)

## 2020-07-21 LAB — MAGNESIUM: Magnesium: 2 mg/dL (ref 1.7–2.4)

## 2020-07-21 MED ORDER — IPRATROPIUM-ALBUTEROL 0.5-2.5 (3) MG/3ML IN SOLN
3.0000 mL | Freq: Three times a day (TID) | RESPIRATORY_TRACT | Status: DC
  Filled 2020-07-21 (×4): qty 3

## 2020-07-21 MED ORDER — IPRATROPIUM-ALBUTEROL 0.5-2.5 (3) MG/3ML IN SOLN
3.0000 mL | Freq: Four times a day (QID) | RESPIRATORY_TRACT | Status: DC | PRN
  Administered 2020-07-21: 3 mL via RESPIRATORY_TRACT
  Filled 2020-07-21: qty 3

## 2020-07-21 MED ORDER — ONDANSETRON HCL 4 MG PO TABS
4.0000 mg | ORAL_TABLET | Freq: Three times a day (TID) | ORAL | Status: DC | PRN
  Administered 2020-07-21 – 2020-07-26 (×4): 4 mg via ORAL
  Filled 2020-07-21 (×5): qty 1

## 2020-07-21 NOTE — Progress Notes (Signed)
PT Cancellation Note  Patient Details Name: [name] MRN: [id number] DOB: 07-31-1945   Cancelled Treatment:    Reason Eval/Treat Not Completed: Fatigue/lethargy limiting ability to participate Pt reports wanting to sleep.  Explained to pt the importance of sleep/wake cycle and starting to mobilize again.  Pt states she feels her "bones need to rest" so again explained the importance of movement.  Pt familiar to this PT from recent admission with which she also refused multiple times for rest.  Pt may benefit from palliative consult to discuss [place].     Ashton Belote,KATHrine E 07/21/2020, 10:58 AM Jannette Spanner PT, DPT Acute Rehabilitation Services Pager: [phone] Office: [phone]

## 2020-07-21 NOTE — Progress Notes (Signed)
PROGRESS NOTE  [name]  GEZ:[id number] DOB: Feb 16, 1946 DOA: 07/19/2020 PCP: Shary Key, DO   Brief Narrative: [name] is a 75 y.o. female with a history of CVA, breast CA on tamoxifen, depression, hypothyroidism with recent increased synthroid dose, recently diagnosed HTN, and recent worsening weakness and fatigue. She was admitted 4/8 for dizziness, HTN urgency, and had negative work up. Ultimately was discharged 4/13 to [facility] for short term rehabilitation. Due to anxiety over possible mistreatment, her sister took her back home 2 days later where the patient has reportedly laid on the floor (unclear how she got there) for 2 days unable to get up. EMS called for welfare check, brought to ED last night. Work up in ED revealed stable vital signs, sinus tachycardia with ketonuria, hypokalemia, and macrocytic anemia.  Assessment & Plan: Principal Problem:   Generalized weakness Active Problems:   Hypothyroidism   COPD (chronic obstructive pulmonary disease) (HCC)   Essential hypertension   Severe episode of recurrent major depressive disorder, without psychotic features ([place])   Malignant neoplasm of overlapping sites of left breast in female, estrogen receptor positive ([place])   Late onset Alzheimer's disease with behavioral disturbance ([place])   History of CVA (cerebrovascular accident)   Hypokalemia   General weakness  Failure to thrive, generalized weakness: - Clearly continues to require SNF level of care.  - Continue treatment for depression and added anxiety medication - Trazodone qHS added to assist with resetting circadian rhythms - OOB during day, PT consulted.   Dehydration, hypokalemia:  - Continue IVF with K supplementation (has improved)   Macrocytic anemia due to vitamin B12 deficiency: Suggests prolonged malnutrition.  - Initiate daily IM supplementation for now  Hypothyroidism: Synthroid dose recently increased due to elevated TSH.  - Continue  synthroid, recheck TSH, T4 in 4-6 weeks.   Breast CA: Dx 2018 s/p mastectomy, declined XRT, started on tamoxifen 2019.  - F/u with Dr. Lindi Adie with attention to sclerotic bone lesions (mets vs. hyperparathyroidism vs. renal osteodystrophy).   DVT prophylaxis: Lovenox Code Status: Full Family Communication: None at bedside Disposition Plan:  Status is: Inpatient  Remains inpatient appropriate because:Unsafe d/c plan   Dispo: The patient is from: Home              Anticipated d/c is to: SNF              Patient currently is medically stable to d/c.   Difficult to place patient No  Consultants:   None  Procedures:   None  Antimicrobials:  None   Subjective: Tired, but slept more last night than she usually does.   Objective: Vitals:   07/20/20 1224 07/20/20 1538 07/20/20 2040 07/21/20 1325  BP: (!) 141/91 (!) 147/78 (!) 156/86 112/62  Pulse: 92 92 94 95  Resp: 15 16 (!) 23 (!) 22  Temp: 98.3 F (36.8 C) 98.4 F (36.9 C) 97.7 F (36.5 C) 98.2 F (36.8 C)  TempSrc:   Oral Oral  SpO2: 99% 99% 99% 96%  Weight:      Height:        Intake/Output Summary (Last 24 hours) at 07/21/2020 1421 Last data filed at 07/21/2020 0300 Gross per 24 hour  Intake 1609.7 ml  Output 300 ml  Net 1309.7 ml   Filed Weights   07/19/20 2236  Weight: 51.3 kg    Gen: 75 y.o. female in no distress Pulm: Non-labored breathing room air. Clear to auscultation bilaterally.  CV: Regular rate  and rhythm. No murmur, rub, or gallop. No JVD, no pitting pedal edema. GI: Abdomen soft, non-tender, non-distended, with normoactive bowel sounds. No organomegaly or masses felt. Ext: Warm, no deformities Skin: No rashes, lesions or ulcers on visualized skin Neuro: Drowsy but oriented without focal neurological deficits. Psych: Judgement and insight appear fair.  Data Reviewed: I have personally reviewed following labs and imaging studies  CBC: Recent Labs  Lab 07/15/20 1244 07/19/20 2250  07/20/20 0616  WBC 5.5 7.4 7.2  NEUTROABS  --  6.0  --   HGB 11.3* 11.6* 10.5*  HCT 34.7* 35.6* 33.0*  MCV 104.5* 102.6* 103.8*  PLT 184 260 007   Basic Metabolic Panel: Recent Labs  Lab 07/15/20 1244 07/19/20 2250 07/20/20 0616 07/21/20 0444  NA 140 141 145 140  K 3.6 2.9* 3.6 4.1  CL 110 109 115* 111  CO2 24 24 22 23   GLUCOSE 117* 170* 118* 137*  BUN 13 14 13 9   CREATININE 0.65 0.65 0.63 0.53  CALCIUM 8.2* 8.1* 8.3* 7.8*  MG  --   --  2.4 2.0   GFR: Estimated Creatinine Clearance: 41.3 mL/min (by C-G formula based on SCr of 0.53 mg/dL). Liver Function Tests: Recent Labs  Lab 07/15/20 1244  AST 15  ALT 9  ALKPHOS 320*  BILITOT 0.6  PROT 5.1*  ALBUMIN 2.8*   No results for input(s): LIPASE, AMYLASE in the last 168 hours. Recent Labs  Lab 07/20/20 0616  AMMONIA 23   Coagulation Profile: No results for input(s): INR, PROTIME in the last 168 hours. Cardiac Enzymes: Recent Labs  Lab 07/19/20 2250  CKTOTAL 39   BNP (last 3 results) No results for input(s): PROBNP in the last 8760 hours. HbA1C: No results for input(s): HGBA1C in the last 72 hours. CBG: No results for input(s): GLUCAP in the last 168 hours. Lipid Profile: No results for input(s): CHOL, HDL, LDLCALC, TRIG, CHOLHDL, LDLDIRECT in the last 72 hours. Thyroid Function Tests: No results for input(s): TSH, T4TOTAL, FREET4, T3FREE, THYROIDAB in the last 72 hours. Anemia Panel: Recent Labs    07/20/20 0616  VITAMINB12 67*  FOLATE 16.3   Urine analysis:    Component Value Date/Time   COLORURINE YELLOW 07/19/2020 2240   APPEARANCEUR CLEAR 07/19/2020 2240   LABSPEC 1.017 07/19/2020 2240   PHURINE 6.0 07/19/2020 2240   GLUCOSEU NEGATIVE 07/19/2020 2240   HGBUR SMALL (A) 07/19/2020 2240   HGBUR negative 04/03/2010 1401   BILIRUBINUR NEGATIVE 07/19/2020 2240   BILIRUBINUR NEG 11/13/2014 1455   KETONESUR 80 (A) 07/19/2020 2240   PROTEINUR 30 (A) 07/19/2020 2240   UROBILINOGEN 0.2 11/16/2014  1204   NITRITE NEGATIVE 07/19/2020 2240   LEUKOCYTESUR TRACE (A) 07/19/2020 2240   Recent Results (from the past 240 hour(s))  Resp Panel by RT-PCR (Flu A&B, Covid) Nasopharyngeal Swab     Status: None   Collection Time: 07/11/20  8:27 PM   Specimen: Nasopharyngeal Swab; Nasopharyngeal(NP) swabs in vial transport medium  Result Value Ref Range Status   SARS Coronavirus 2 by RT PCR NEGATIVE NEGATIVE Final    Comment: (NOTE) SARS-CoV-2 target nucleic acids are NOT DETECTED.  The SARS-CoV-2 RNA is generally detectable in upper respiratory specimens during the acute phase of infection. The lowest concentration of SARS-CoV-2 viral copies this assay can detect is 138 copies/mL. A negative result does not preclude SARS-Cov-2 infection and should not be used as the sole basis for treatment or other patient management decisions. A negative result may occur with  improper specimen collection/handling, submission of specimen other than nasopharyngeal swab, presence of viral mutation(s) within the areas targeted by this assay, and inadequate number of viral copies(<138 copies/mL). A negative result must be combined with clinical observations, patient history, and epidemiological information. The expected result is Negative.  Fact Sheet for Patients:  EntrepreneurPulse.com.au  Fact Sheet for Healthcare Providers:  IncredibleEmployment.be  This test is no t yet approved or cleared by the [place] FDA and  has been authorized for detection and/or diagnosis of SARS-CoV-2 by FDA under an Emergency Use Authorization (EUA). This EUA will remain  in effect (meaning this test can be used) for the duration of the COVID-19 declaration under Section 564(b)(1) of the Act, 21 U.S.C.section 360bbb-3(b)(1), unless the authorization is terminated  or revoked sooner.       Influenza A by PCR NEGATIVE NEGATIVE Final   Influenza B by PCR NEGATIVE NEGATIVE Final     Comment: (NOTE) The Xpert Xpress SARS-CoV-2/FLU/RSV plus assay is intended as an aid in the diagnosis of influenza from Nasopharyngeal swab specimens and should not be used as a sole basis for treatment. Nasal washings and aspirates are unacceptable for Xpert Xpress SARS-CoV-2/FLU/RSV testing.  Fact Sheet for Patients: EntrepreneurPulse.com.au  Fact Sheet for Healthcare Providers: IncredibleEmployment.be  This test is not yet approved or cleared by the [place] FDA and has been authorized for detection and/or diagnosis of SARS-CoV-2 by FDA under an Emergency Use Authorization (EUA). This EUA will remain in effect (meaning this test can be used) for the duration of the COVID-19 declaration under Section 564(b)(1) of the Act, 21 U.S.C. section 360bbb-3(b)(1), unless the authorization is terminated or revoked.  Performed at [facility], [place] [address], [place], [place] [address]   SARS CORONAVIRUS 2 (TAT 6-24 HRS) Nasopharyngeal Nasopharyngeal Swab     Status: None   Collection Time: 07/15/20 10:32 AM   Specimen: Nasopharyngeal Swab  Result Value Ref Range Status   SARS Coronavirus 2 NEGATIVE NEGATIVE Final    Comment: (NOTE) SARS-CoV-2 target nucleic acids are NOT DETECTED.  The SARS-CoV-2 RNA is generally detectable in upper and lower respiratory specimens during the acute phase of infection. Negative results do not preclude SARS-CoV-2 infection, do not rule out co-infections with other pathogens, and should not be used as the sole basis for treatment or other patient management decisions. Negative results must be combined with clinical observations, patient history, and epidemiological information. The expected result is Negative.  Fact Sheet for Patients: SugarRoll.be  Fact Sheet for Healthcare Providers: https://www.woods-mathews.com/  This test is not yet approved or  cleared by the [place] FDA and  has been authorized for detection and/or diagnosis of SARS-CoV-2 by FDA under an Emergency Use Authorization (EUA). This EUA will remain  in effect (meaning this test can be used) for the duration of the COVID-19 declaration under Se ction 564(b)(1) of the Act, 21 U.S.C. section 360bbb-3(b)(1), unless the authorization is terminated or revoked sooner.  Performed at [facility], [place] [address], [place], [place] [address]   SARS CORONAVIRUS 2 (TAT 6-24 HRS) Nasopharyngeal Nasopharyngeal Swab     Status: None   Collection Time: 07/20/20 12:43 AM   Specimen: Nasopharyngeal Swab  Result Value Ref Range Status   SARS Coronavirus 2 NEGATIVE NEGATIVE Final    Comment: (NOTE) SARS-CoV-2 target nucleic acids are NOT DETECTED.  The SARS-CoV-2 RNA is generally detectable in upper and lower respiratory specimens during the acute phase of infection. Negative results do not  preclude SARS-CoV-2 infection, do not rule out co-infections with other pathogens, and should not be used as the sole basis for treatment or other patient management decisions. Negative results must be combined with clinical observations, patient history, and epidemiological information. The expected result is Negative.  Fact Sheet for Patients: SugarRoll.be  Fact Sheet for Healthcare Providers: https://www.woods-mathews.com/  This test is not yet approved or cleared by the [place] FDA and  has been authorized for detection and/or diagnosis of SARS-CoV-2 by FDA under an Emergency Use Authorization (EUA). This EUA will remain  in effect (meaning this test can be used) for the duration of the COVID-19 declaration under Se ction 564(b)(1) of the Act, 21 U.S.C. section 360bbb-3(b)(1), unless the authorization is terminated or revoked sooner.  Performed at [facility], [place] [address] [place], [place] [address]        Radiology Studies: No results found.  Scheduled Meds: . amLODipine  5 mg Oral Daily  . aspirin  325 mg Oral Daily  . cyanocobalamin  1,000 mcg Intramuscular Daily  . enoxaparin (LOVENOX) injection  40 mg Subcutaneous Q24H  . escitalopram  20 mg Oral Daily  . levothyroxine  150 mcg Oral Q0600  . LORazepam  1 mg Oral QHS  . tamoxifen  20 mg Oral Daily   Continuous Infusions: . dextrose 5 % and 0.45 % NaCl with KCl 20 mEq/L 100 mL/hr at 07/21/20 1251     LOS: 1 day   Time spent: 25 minutes.  [name], MD [facility] [email or web address] 07/21/2020, 2:21 PM

## 2020-07-22 DIAGNOSIS — Z8673 Personal history of transient ischemic attack (TIA), and cerebral infarction without residual deficits: Secondary | ICD-10-CM | POA: Diagnosis not present

## 2020-07-22 DIAGNOSIS — R531 Weakness: Secondary | ICD-10-CM | POA: Diagnosis not present

## 2020-07-22 DIAGNOSIS — J449 Chronic obstructive pulmonary disease, unspecified: Secondary | ICD-10-CM | POA: Diagnosis not present

## 2020-07-22 DIAGNOSIS — I10 Essential (primary) hypertension: Secondary | ICD-10-CM | POA: Diagnosis not present

## 2020-07-22 LAB — BASIC METABOLIC PANEL
Anion gap: 5 (ref 5–15)
BUN: 6 mg/dL — ABNORMAL LOW (ref 8–23)
CO2: 25 mmol/L (ref 22–32)
Calcium: 7.9 mg/dL — ABNORMAL LOW (ref 8.9–10.3)
Chloride: 108 mmol/L (ref 98–111)
Creatinine, Ser: 0.62 mg/dL (ref 0.44–1.00)
GFR, Estimated: >60 mL/min (ref >60)
Glucose, Bld: 119 mg/dL — ABNORMAL HIGH (ref 70–99)
Potassium: 4.4 mmol/L (ref 3.5–5.1)
Sodium: 138 mmol/L (ref 135–145)

## 2020-07-22 MED ORDER — DICLOFENAC SODIUM 1 % EX GEL
2.0000 g | Freq: Four times a day (QID) | CUTANEOUS | Status: DC
  Administered 2020-07-22 – 2020-07-29 (×15): 2 g via TOPICAL
  Filled 2020-07-22: qty 100

## 2020-07-22 MED ORDER — SENNOSIDES-DOCUSATE SODIUM 8.6-50 MG PO TABS
1.0000 | ORAL_TABLET | Freq: Two times a day (BID) | ORAL | Status: DC
  Administered 2020-07-22 – 2020-07-28 (×12): 1 via ORAL
  Filled 2020-07-22 (×13): qty 1

## 2020-07-22 MED ORDER — PROMETHAZINE HCL 25 MG PO TABS
12.5000 mg | ORAL_TABLET | Freq: Three times a day (TID) | ORAL | Status: DC | PRN
  Administered 2020-07-22 – 2020-07-28 (×3): 12.5 mg via ORAL
  Filled 2020-07-22 (×3): qty 1

## 2020-07-22 MED ORDER — DEXTROSE-NACL 5-0.45 % IV SOLN: INTRAVENOUS | Status: DC

## 2020-07-22 MED ORDER — POLYETHYLENE GLYCOL 3350 17 G PO PACK
17.0000 g | PACK | Freq: Every day | ORAL | Status: DC
  Administered 2020-07-22 – 2020-07-25 (×4): 17 g via ORAL
  Filled 2020-07-22 (×7): qty 1

## 2020-07-22 MED ORDER — BISACODYL 10 MG RE SUPP: 10.0000 mg | Freq: Every day | RECTAL | Status: DC | PRN

## 2020-07-22 NOTE — Progress Notes (Signed)
Patient daughter Lynelle Smoke was at bedside this evening she would like updated on patient condition. She verified her phone number [phone]

## 2020-07-22 NOTE — Evaluation (Signed)
Physical Therapy Evaluation Patient Details Name: RAYMONDE HAMBLIN MRN: [id number] DOB: October 05, 1945 Today's Date: 07/22/2020   History of Present Illness  75 y.o. female  who presents failure to thrive and generalized weakness. Pt with recent admission 4/8 for dizziness, HTN urgency, and had negative work up. Ultimately was discharged 4/13 to [facility] for short term rehabilitation. Due to anxiety over possible mistreatment, her sister took her back home 2 days later where the patient reportedly laid on the floor (unclear how she got there) for 2 days unable to get up. PMH: medical history significant for breast cancer diagnosed in 2018 s/p left mastectomy, radiation and currently on tamoxifen, hypertension, CVA, hypothyroidism, COPD, asthma, CKD stage II, and GERD  Clinical Impression  Pt admitted with above diagnosis.  Pt currently with functional limitations due to the deficits listed below (see PT Problem List). Pt will benefit from skilled PT to increase their independence and safety with mobility to allow discharge to the venue listed below.  Pt agreeable for OOB to use [facility] for BM so nurse tech and therapist worked together to mobilize and bath pt since she was finally agreeable.  Pt encouraged to remain awake today and television on and pt's lunch in front of pt end of session.  Pt would benefit from SNF if agreeable to participate in rehab.  Pt with recent admission and discharged to SNF however went home within a few days and apparently fell at home leading to this admission.  Pt does not appear able to function safely at home alone at this time.     Follow Up Recommendations SNF    Equipment Recommendations  Rolling walker with 5" wheels (youth)    Recommendations for Other Services       Precautions / Restrictions Precautions Precautions: Fall      Mobility  Bed Mobility Overal bed mobility: Needs Assistance Bed Mobility: Supine to Sit;Sit to Supine     Supine to sit: Min  assist Sit to supine: Min assist   General bed mobility comments: assist to initiate, assist for trunk upright and LEs onto bed    Transfers Overall transfer level: Needs assistance Equipment used: None Transfers: Sit to/from [facility] Sit to Stand: Min assist;+2 safety/equipment Stand pivot transfers: Min assist;+2 safety/equipment       General transfer comment: assist pt with stand pivot to [facility] for BM, multimodal cues for hand placement as pt requires UE support  Ambulation/Gait             General Gait Details: pt declined, pt did take a few steps up HOB upon returning to bed with bil UE support  Stairs            Wheelchair Mobility    Modified Rankin (Stroke Patients Only)       Balance Overall balance assessment: Needs assistance         Standing balance support: Bilateral upper extremity supported Standing balance-Leahy Scale: Poor Standing balance comment: reliant on UE support                             Pertinent Vitals/Pain Pain Assessment: Faces Faces Pain Scale: Hurts a little bit Pain Location: back Pain Descriptors / Indicators: Discomfort Pain Intervention(s): Repositioned;Monitored during session    Home Living Family/patient expects to be discharged to:: Private residence (lives in apartment building high rise with elevators.) Living Arrangements: Alone (dtr lives 1 hour away and visits her  1x/ month to pay the bilss and get groceries)   Type of Home: Apartment Home Access: Elevator     Home Layout: One level Home Equipment: Cane - single point Additional Comments: per recent admission; pt poor historian at this time    Prior Function Level of Independence: Independent with assistive device(s)         Comments: tries to use cane but it is not enough. Pt states that she has not been able to get around much or care for herself very well due to feeling so weak all the time. She is lonely, and  just feels like she may die sometimes, she is only eating peanut butter and jelly sandwichs because does not have energy to make it into the kitchen. reports activity and mobility limited by persistent dizziness and "not feeling good."     Hand Dominance   Dominant Hand: Right    Extremity/Trunk Assessment   Upper Extremity Assessment Upper Extremity Assessment: Generalized weakness    Lower Extremity Assessment Lower Extremity Assessment: Generalized weakness    Cervical / Trunk Assessment Cervical / Trunk Assessment: Kyphotic  Communication   Communication: No difficulties  Cognition Arousal/Alertness: Awake/alert Behavior During Therapy: WFL for tasks assessed/performed Overall Cognitive Status: No family/caregiver present to determine baseline cognitive functioning                                 General Comments: pt following commands/cues as able, nursing staff report pt has not been agreeable to [facility] or bathing today however agreeable at this time so assisted mobility and nurse tech assisted with pericare and bathing, pt's cognition seems clear at times however upon leaving room, pt asking who therapist's father was end of session?      General Comments      Exercises     Assessment/Plan    PT Assessment Patient needs continued PT services  PT Problem List Decreased strength;Decreased activity tolerance;Decreased mobility;Decreased knowledge of use of DME;Decreased cognition;Decreased balance       PT Treatment Interventions DME instruction;Gait training;Functional mobility training;Therapeutic activities;Therapeutic exercise;Patient/family education;Balance training    PT Goals (Current goals can be found in the Care Plan section)  Acute Rehab PT Goals PT Goal Formulation: With patient Time For Goal Achievement: 08/05/20 Potential to Achieve Goals: Fair    Frequency Min 2X/week   Barriers to discharge        Co-evaluation                AM-PAC PT "6 Clicks" Mobility  Outcome Measure Help needed turning from your back to your side while in a flat bed without using bedrails?: A Little Help needed moving from lying on your back to sitting on the side of a flat bed without using bedrails?: A Little Help needed moving to and from a bed to a chair (including a wheelchair)?: A Lot Help needed standing up from a chair using your arms (e.g., wheelchair or bedside chair)?: A Lot Help needed to walk in hospital room?: A Lot Help needed climbing 3-5 steps with a railing? : Total 6 Click Score: 13    End of Session   Activity Tolerance: Patient limited by fatigue Patient left: in bed;with call bell/phone within reach;with bed alarm set Nurse Communication: Mobility status PT Visit Diagnosis: Muscle weakness (generalized) (M62.81);Difficulty in walking, not elsewhere classified (R26.2)    Time: 8841-6606 PT Time Calculation (min) (ACUTE ONLY): 21 min  Charges:   PT Evaluation $PT Eval Low Complexity: 1 Low        Kati PT, DPT Acute Rehabilitation Services Pager: [phone] Office: [phone]  Trena Platt 07/22/2020, 2:07 PM

## 2020-07-22 NOTE — TOC Initial Note (Signed)
Transition of Care [facility]) - Initial/Assessment Note    Patient Details  Name: [name] MRN: [id number] Date of Birth: March 01, 1946  Transition of Care [facility]) CM/SW Contact:    Trish Mage, LCSW Phone Number: 07/22/2020, 11:20 AM  Clinical Narrative:   Although patient has been unwilling to work with PT to this point since admission, I went to see her to find out if SNF is something she is considering.  Found [name] to be awake, alert, engageable.  She is focused on the amount of pain she is experiencing "all the time," and is tired of living like that.  She also acknowledges that her food intake is minimal, and understands that is unsustainable.  [name] is looking forward to meeting with a member of the palliative team to find out about her options.  I agreed to circle back to follow up with her after that conversation.  She had me help her call family members-she left a message for daughter and sister-second sister answered and I excused myself. TOC will continue to follow during the course of hospitalization.              Expected Discharge Plan:  (TBD) Barriers to Discharge: Other (comment) (Dispositional plan TBD)   Patient Goals and CMS Choice        Expected Discharge Plan and Services Expected Discharge Plan:  (TBD)   Discharge Planning Services: CM Consult   Living arrangements for the past 2 months: Apartment                                      Prior Living Arrangements/Services Living arrangements for the past 2 months: Apartment Lives with:: Self Patient language and need for interpreter reviewed:: Yes        Need for Family Participation in Patient Care: Yes (Comment) Care giver support system in place?: Yes (comment) Current home services: DME Criminal Activity/Legal Involvement Pertinent to Current Situation/Hospitalization: No - Comment as needed  Activities of Daily Living Home Assistive Devices/Equipment: Cane (specify quad or  straight) ADL Screening (condition at time of admission) Patient's cognitive ability adequate to safely complete daily activities?: Yes Is the patient deaf or have difficulty hearing?: No Does the patient have difficulty seeing, even when wearing glasses/contacts?: No Does the patient have difficulty concentrating, remembering, or making decisions?: No Patient able to express need for assistance with ADLs?: Yes Does the patient have difficulty dressing or bathing?: No Independently performs ADLs?: Yes (appropriate for developmental age) Does the patient have difficulty walking or climbing stairs?: Yes Weakness of Legs: Both Weakness of Arms/Hands: None  Permission Sought/Granted Permission sought to share information with : Family Supports Permission granted to share information with : Yes, Verbal Permission Granted  Share Information with NAME: [name] (Daughter)   [phone]           Emotional Assessment Appearance:: Appears stated age Attitude/Demeanor/Rapport: Engaged Affect (typically observed): Flat Orientation: : Oriented to Self,Oriented to Place,Oriented to Situation Alcohol / Substance Use: Not Applicable Psych Involvement: No (comment)  Admission diagnosis:  Hypokalemia [E87.6] General weakness [R53.1] Macrocytosis without anemia [D75.89] Failure to thrive in adult [R62.7] Generalized weakness [R53.1] Patient Active Problem List   Diagnosis Date Noted  . Generalized weakness 07/20/2020  . Hypokalemia 07/20/2020  . General weakness 07/20/2020  . Failure to thrive in adult   . History of CVA (cerebrovascular accident) 07/12/2020  . History  of breast cancer 07/12/2020  . CKD (chronic kidney disease) stage 2, GFR 60-89 ml/min 07/12/2020  . Dehydration 07/12/2020  . Dizzy 07/11/2020  . Skin lesion of right leg 04/24/2020  . Inclusion cyst 04/24/2020  . Skin lesion of right arm 08/16/2019  . Sleep disturbance 06/13/2018  . Health education 06/13/2018  .  Chronic left hip pain 12/29/2017  . Late onset Alzheimer's disease with behavioral disturbance ([place]) 12/29/2017  . Cancer of left breast, stage 1, estrogen receptor positive ([place]) 05/13/2017  . Malignant neoplasm of overlapping sites of left breast in female, estrogen receptor positive ([place]) 03/08/2017  . Lesion of nose 03/02/2017  . Callus of foot 11/20/2016  . Loss of appetite 12/29/2015  . Fatigue 12/29/2015  . Bipolar affective disorder, currently depressed, moderate ([place]) 11/26/2015  . Depression   . Severe episode of recurrent major depressive disorder, without psychotic features ([place])   . Foreign body in foot, right 01/09/2015  . Blister of right ankle without infection 01/09/2015  . Sinusitis 11/26/2014  . Rotator cuff syndrome of right shoulder 11/14/2014  . Varicose veins of both lower extremities with pain 10/29/2014  . Neck pain 09/26/2014  . Metatarsal fracture 08/16/2014  . Pain in joint, shoulder region 08/16/2014  . Facial skin lesion 08/01/2014  . Leg edema, left 07/25/2014  . Edema of left lower extremity 07/22/2014  . Essential hypertension   . Thyroid activity decreased   . HLD (hyperlipidemia)   . Healthcare maintenance 05/31/2014  . Emotional neurotic disorder 05/07/2014  . Seborrheic keratosis 12/05/2013  . 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 Mass of left side of neck 10/17/2012  . At high risk for falls 06/28/2012  . Allergic rhinitis 06/16/2012  . Mass of left breast 10/27/2011  . Hearing loss 01/07/2011  . Foot pain 11/25/2010  . Thyroid nodule 10/16/2010  . Nausea 09/22/2010  . URINARY INCONTINENCE, STRESS, FEMALE 04/03/2010  . HYPERLIPIDEMIA 03/25/2010  . PULMONARY NODULE 12/23/2009  . COPD (chronic obstructive pulmonary disease) ([place]) 12/03/2009  . Tobacco abuse counseling 11/05/2009  . CVA 11/05/2009  . BIPOLAR DISORDER UNSPECIFIED 11/28/2008  . Hypothyroidism 05/06/2008  . OCD (obsessive compulsive disorder) 10/13/2006  . GERD  10/13/2006  . OSTEOPOROSIS 10/13/2006   PCP:  Shary Key, DO Pharmacy:   [id number] - [place], [place] [address] [place] [address] Phone: [phone] Fax: [phone]     Social Determinants of Health (SDOH) Interventions    Readmission Risk Interventions No flowsheet data found.
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
## 2020-07-23 DIAGNOSIS — J449 Chronic obstructive pulmonary disease, unspecified: Secondary | ICD-10-CM | POA: Diagnosis not present

## 2020-07-23 DIAGNOSIS — I10 Essential (primary) hypertension: Secondary | ICD-10-CM | POA: Diagnosis not present

## 2020-07-23 DIAGNOSIS — R531 Weakness: Secondary | ICD-10-CM | POA: Diagnosis not present

## 2020-07-23 DIAGNOSIS — Z8673 Personal history of transient ischemic attack (TIA), and cerebral infarction without residual deficits: Secondary | ICD-10-CM | POA: Diagnosis not present

## 2020-07-23 LAB — BASIC METABOLIC PANEL
Anion gap: 8 (ref 5–15)
BUN: 9 mg/dL (ref 8–23)
CO2: 25 mmol/L (ref 22–32)
Calcium: 7.9 mg/dL — ABNORMAL LOW (ref 8.9–10.3)
Chloride: 104 mmol/L (ref 98–111)
Creatinine, Ser: 0.72 mg/dL (ref 0.44–1.00)
GFR, Estimated: >60 mL/min (ref >60)
Glucose, Bld: 128 mg/dL — ABNORMAL HIGH (ref 70–99)
Potassium: 3.8 mmol/L (ref 3.5–5.1)
Sodium: 137 mmol/L (ref 135–145)

## 2020-07-23 NOTE — Care Management Important Message (Signed)
Important Message  Patient Details IM Letter given to the Patient Name: [name] MRN: [id number] Date of Birth: 08-Sep-1945   Medicare Important Message Given:  Yes     Kerin Salen 07/23/2020, 10:55 AM

## 2020-07-23 NOTE — Progress Notes (Signed)
     Referral received for [name] :goals of care discussion. Chart reviewed and updates received from RN. Patient assessed and is unable to engage appropriately in discussions. Ms. [name] is confused on assessment, she is alert to self only, continues to state it is Sunday and that she is at her sister's house. Somewhat somnolent stating she wanted to nap and not interested in talking. When she asked who I was and I explained she wanted to know who sent me and how did I get into the house.   Attempted to contact patient's daughter, [name] as listed as questionable POA. Unable to reach. Voicemail left with contact information given.   PMT will re-attempt to contact family at a later time/date. Detailed note and recommendations to follow once GOC has been completed.   Thank you for your referral and allowing PMT to assist in Ms. [name] [facility] care.   Alda Lea, AGPCNP-BC Palliative Medicine Team  Phone: [phone] Pager: [phone] Amion: N. [place]   NO CHARGE

## 2020-07-23 NOTE — Progress Notes (Signed)
PROGRESS NOTE  [name]  NWG:[id number] DOB: 12/11/1945 DOA: 07/19/2020 PCP: Shary Key, DO   Brief Narrative:  [name] is a 75 y.o. female with a history of CVA, breast CA on tamoxifen, depression, hypothyroidism with recent increased synthroid dose, recently diagnosed HTN, presented to hospital with worsening weakness and fatigue.  Patient was recently admitted on 4/8 for dizziness, hypertensive urgency and had a negative work-up.  She was then discharged on 07/16/2020 to a [facility] skilled nursing facility for short-term rehab.  Patient's sister had taken her home due to concerns of mistreatment at the facility but at home patient was found to be lying on the floor for around 2 days.  EMS was called in for welfare check.  In the ED, patient had stable vitals but was sinus tachycardia with ketonuria, hypokalemia and microcytic anemia.  Patient was given IV fluids and was admitted to the hospital for further evaluation and treatment.  Assessment & Plan: Principal Problem:   Generalized weakness Active Problems:   Hypothyroidism   COPD (chronic obstructive pulmonary disease) (HCC)   Essential hypertension   Severe episode of recurrent major depressive disorder, without psychotic features ([place])   Malignant neoplasm of overlapping sites of left breast in female, estrogen receptor positive ([place])   Late onset Alzheimer's disease with behavioral disturbance ([facility])   History of CVA (cerebrovascular accident)   Hypokalemia   General weakness  Failure to thrive, generalized weakness: Physical therapy was consulted and recommended skilled nursing facility level of care.  Awaiting for palliative care input regarding further goals of care.   Constipation:  Continue laxatives.  Closely monitor  Dehydration, Improved with IV fluid hydration.  Hypokalemia:  Improved after replacement.  Check BMP in AM.  Macrocytic anemia due to vitamin B12 deficiency:  Likely secondary to  prolonged malnutrition.  Continue IM supplementation for now.  Hypothyroidism:  Continue Synthroid.  Dose was recently increased.  Check TSH and T4 in 4 to 6 weeks   History of breast cancer diagnosis 2018 status postmastectomy declined XRT.  On tamoxifen.  Follows up with Dr. Lindi Adie as outpatient.  Some sclerotic bone lesions metastasis versus hyperparathyroidism versus renal osteodystrophy).   DVT prophylaxis:  Lovenox subcu  Code Status:  Full code  Family Communication:  None today  Disposition Plan:  Status is: Inpatient  Remains inpatient appropriate because:Unsafe d/c plan  Dispo: The patient is from: Home              Anticipated d/c is to: SNF              Patient currently is medically stable to d/c.  Await palliative care input   Difficult to place patient No  Consultants:   Palliative care  Procedures:   None  Antimicrobials:  None   Subjective: Today, patient was seen and examined at bedside.  Patient feels little sleepy.  Denies any nausea, vomiting, fever chills or rigor.  Objective: Vitals:   07/21/20 2227 07/22/20 0525 07/22/20 1437 07/23/20 0528  BP:  (!) 154/82 (!) 142/72 (!) 155/87  Pulse:  96 98 (!) 106  Resp:  18 16 18   Temp:  98.4 F (36.9 C) 97.9 F (36.6 C) 98.6 F (37 C)  TempSrc:   Oral   SpO2: 98% 100% 97% 91%  Weight:      Height:        Intake/Output Summary (Last 24 hours) at 07/23/2020 1226 Last data filed at 07/23/2020 0640 Gross per 24 hour  Intake 1590.99 ml  Output 1350 ml  Net 240.99 ml   Filed Weights   07/19/20 2236  Weight: 51.3 kg   Body mass index is 24.47 kg/m.   General:  Average built, not in obvious distress HENT:   No scleral pallor or icterus noted. Oral mucosa is dry Chest:  Clear breath sounds.  Diminished breath sounds bilaterally. No crackles or wheezes.  CVS: S1 &S2 heard. No murmur.  Regular rate and rhythm. Abdomen: Soft, nontender, nondistended.  Bowel sounds are heard.   Extremities: No  cyanosis, clubbing or edema.  Lower extremities with bruises. Psych: Alert, awake and communicative CNS:  No cranial nerve deficits.  Power equal in all extremities.   Skin: Warm and dry.  No rashes noted.   Data Reviewed: I have personally reviewed following labs and imaging studies  CBC: Recent Labs  Lab 07/19/20 2250 07/20/20 0616  WBC 7.4 7.2  NEUTROABS 6.0  --   HGB 11.6* 10.5*  HCT 35.6* 33.0*  MCV 102.6* 103.8*  PLT 260 671   Basic Metabolic Panel: Recent Labs  Lab 07/19/20 2250 07/20/20 0616 07/21/20 0444 07/22/20 0548 07/23/20 0523  NA 141 145 140 138 137  K 2.9* 3.6 4.1 4.4 3.8  CL 109 115* 111 108 104  CO2 24 22 23 25 25   GLUCOSE 170* 118* 137* 119* 128*  BUN 14 13 9  6* 9  CREATININE 0.65 0.63 0.53 0.62 0.72  CALCIUM 8.1* 8.3* 7.8* 7.9* 7.9*  MG  --  2.4 2.0  --   --    GFR: Estimated Creatinine Clearance: 41.3 mL/min (by C-G formula based on SCr of 0.72 mg/dL). Liver Function Tests: No results for input(s): AST, ALT, ALKPHOS, BILITOT, PROT, ALBUMIN in the last 168 hours. No results for input(s): LIPASE, AMYLASE in the last 168 hours. Recent Labs  Lab 07/20/20 0616  AMMONIA 23   Coagulation Profile: No results for input(s): INR, PROTIME in the last 168 hours. Cardiac Enzymes: Recent Labs  Lab 07/19/20 2250  CKTOTAL 39   BNP (last 3 results) No results for input(s): PROBNP in the last 8760 hours. HbA1C: No results for input(s): HGBA1C in the last 72 hours. CBG: No results for input(s): GLUCAP in the last 168 hours. Lipid Profile: No results for input(s): CHOL, HDL, LDLCALC, TRIG, CHOLHDL, LDLDIRECT in the last 72 hours. Thyroid Function Tests: No results for input(s): TSH, T4TOTAL, FREET4, T3FREE, THYROIDAB in the last 72 hours. Anemia Panel: No results for input(s): VITAMINB12, FOLATE, FERRITIN, TIBC, IRON, RETICCTPCT in the last 72 hours. Urine analysis:    Component Value Date/Time   COLORURINE YELLOW 07/19/2020 2240   APPEARANCEUR  CLEAR 07/19/2020 2240   LABSPEC 1.017 07/19/2020 2240   PHURINE 6.0 07/19/2020 2240   GLUCOSEU NEGATIVE 07/19/2020 2240   HGBUR SMALL (A) 07/19/2020 2240   HGBUR negative 04/03/2010 1401   BILIRUBINUR NEGATIVE 07/19/2020 2240   BILIRUBINUR NEG 11/13/2014 1455   KETONESUR 80 (A) 07/19/2020 2240   PROTEINUR 30 (A) 07/19/2020 2240   UROBILINOGEN 0.2 11/16/2014 1204   NITRITE NEGATIVE 07/19/2020 2240   LEUKOCYTESUR TRACE (A) 07/19/2020 2240   Recent Results (from the past 240 hour(s))  SARS CORONAVIRUS 2 (TAT 6-24 HRS) Nasopharyngeal Nasopharyngeal Swab     Status: None   Collection Time: 07/15/20 10:32 AM   Specimen: Nasopharyngeal Swab  Result Value Ref Range Status   SARS Coronavirus 2 NEGATIVE NEGATIVE Final    Comment: (NOTE) SARS-CoV-2 target nucleic acids are NOT DETECTED.  The SARS-CoV-2  RNA is generally detectable in upper and lower respiratory specimens during the acute phase of infection. Negative results do not preclude SARS-CoV-2 infection, do not rule out co-infections with other pathogens, and should not be used as the sole basis for treatment or other patient management decisions. Negative results must be combined with clinical observations, patient history, and epidemiological information. The expected result is Negative.  Fact Sheet for Patients: SugarRoll.be  Fact Sheet for Healthcare Providers: https://www.woods-mathews.com/  This test is not yet approved or cleared by the [place] FDA and  has been authorized for detection and/or diagnosis of SARS-CoV-2 by FDA under an Emergency Use Authorization (EUA). This EUA will remain  in effect (meaning this test can be used) for the duration of the COVID-19 declaration under Se ction 564(b)(1) of the Act, 21 U.S.C. section 360bbb-3(b)(1), unless the authorization is terminated or revoked sooner.  Performed at [facility], [place] [address], [place],  [place] [address]   SARS CORONAVIRUS 2 (TAT 6-24 HRS) Nasopharyngeal Nasopharyngeal Swab     Status: None   Collection Time: 07/20/20 12:43 AM   Specimen: Nasopharyngeal Swab  Result Value Ref Range Status   SARS Coronavirus 2 NEGATIVE NEGATIVE Final    Comment: (NOTE) SARS-CoV-2 target nucleic acids are NOT DETECTED.  The SARS-CoV-2 RNA is generally detectable in upper and lower respiratory specimens during the acute phase of infection. Negative results do not preclude SARS-CoV-2 infection, do not rule out co-infections with other pathogens, and should not be used as the sole basis for treatment or other patient management decisions. Negative results must be combined with clinical observations, patient history, and epidemiological information. The expected result is Negative.  Fact Sheet for Patients: SugarRoll.be  Fact Sheet for Healthcare Providers: https://www.woods-mathews.com/  This test is not yet approved or cleared by the [place] FDA and  has been authorized for detection and/or diagnosis of SARS-CoV-2 by FDA under an Emergency Use Authorization (EUA). This EUA will remain  in effect (meaning this test can be used) for the duration of the COVID-19 declaration under Se ction 564(b)(1) of the Act, 21 U.S.C. section 360bbb-3(b)(1), unless the authorization is terminated or revoked sooner.  Performed at [facility], [place] [address], [place], [place] [address]       Radiology Studies: No results found.  Scheduled Meds: . amLODipine  5 mg Oral Daily  . aspirin  325 mg Oral Daily  . cyanocobalamin  1,000 mcg Intramuscular Daily  . diclofenac Sodium  2 g Topical QID  . enoxaparin (LOVENOX) injection  40 mg Subcutaneous Q24H  . escitalopram  20 mg Oral Daily  . ipratropium-albuterol  3 mL Nebulization TID  . levothyroxine  150 mcg Oral Q0600  . LORazepam  1 mg Oral QHS  . polyethylene glycol  17 g Oral Daily  .  senna-docusate  1 tablet Oral BID  . tamoxifen  20 mg Oral Daily   Continuous Infusions: . dextrose 5 % and 0.45% NaCl 75 mL/hr at 07/23/20 0024     LOS: 3 days    Flora Lipps, MD [facility] 07/23/2020, 12:26 PM

## 2020-07-24 DIAGNOSIS — R531 Weakness: Secondary | ICD-10-CM | POA: Diagnosis not present

## 2020-07-24 DIAGNOSIS — Z515 Encounter for palliative care: Secondary | ICD-10-CM | POA: Diagnosis not present

## 2020-07-24 DIAGNOSIS — Z7189 Other specified counseling: Secondary | ICD-10-CM | POA: Diagnosis not present

## 2020-07-24 DIAGNOSIS — C50812 Malignant neoplasm of overlapping sites of left female breast: Secondary | ICD-10-CM

## 2020-07-24 DIAGNOSIS — I10 Essential (primary) hypertension: Secondary | ICD-10-CM | POA: Diagnosis not present

## 2020-07-24 DIAGNOSIS — Z170 Estrogen receptor positive status [ER+]: Secondary | ICD-10-CM

## 2020-07-24 DIAGNOSIS — Z8673 Personal history of transient ischemic attack (TIA), and cerebral infarction without residual deficits: Secondary | ICD-10-CM | POA: Diagnosis not present

## 2020-07-24 DIAGNOSIS — J449 Chronic obstructive pulmonary disease, unspecified: Secondary | ICD-10-CM | POA: Diagnosis not present

## 2020-07-24 LAB — CBC
HCT: 32 % — ABNORMAL LOW (ref 36.0–46.0)
Hemoglobin: 10.2 g/dL — ABNORMAL LOW (ref 12.0–15.0)
MCH: 33.3 pg (ref 26.0–34.0)
MCHC: 31.9 g/dL (ref 30.0–36.0)
MCV: 104.6 fL — ABNORMAL HIGH (ref 80.0–100.0)
Platelets: 331 10*3/uL (ref 150–400)
RBC: 3.06 MIL/uL — ABNORMAL LOW (ref 3.87–5.11)
RDW: 12.8 % (ref 11.5–15.5)
WBC: 5.9 10*3/uL (ref 4.0–10.5)
nRBC: 0 % (ref 0.0–0.2)

## 2020-07-24 LAB — MAGNESIUM: Magnesium: 1.9 mg/dL (ref 1.7–2.4)

## 2020-07-24 LAB — BASIC METABOLIC PANEL
Anion gap: 6 (ref 5–15)
BUN: 8 mg/dL (ref 8–23)
CO2: 26 mmol/L (ref 22–32)
Calcium: 7.9 mg/dL — ABNORMAL LOW (ref 8.9–10.3)
Chloride: 106 mmol/L (ref 98–111)
Creatinine, Ser: 0.64 mg/dL (ref 0.44–1.00)
GFR, Estimated: >60 mL/min (ref >60)
Glucose, Bld: 120 mg/dL — ABNORMAL HIGH (ref 70–99)
Potassium: 3.7 mmol/L (ref 3.5–5.1)
Sodium: 138 mmol/L (ref 135–145)

## 2020-07-24 LAB — PHOSPHORUS: Phosphorus: 3.1 mg/dL (ref 2.5–4.6)

## 2020-07-24 NOTE — Progress Notes (Signed)
PROGRESS NOTE  [name]  NID:[id number] DOB: [birth year] DOA: 07/19/2020 PCP: Shary Key, DO   Brief Narrative:  [name] is a 75 y.o. female with a history of CVA, breast CA on tamoxifen, depression, hypothyroidism with recent increased synthroid dose, recently diagnosed HTN, presented to hospital with worsening weakness and fatigue.  Patient was recently admitted on 4/8 for dizziness, hypertensive urgency and had a negative work-up.  She was then discharged on 07/16/2020 to a [facility] skilled nursing facility for short-term rehab.  Patient's sister had taken her home due to concerns of mistreatment at the facility but at home patient was found to be lying on the floor for around 2 days.  EMS was called in for welfare check.  In the ED, patient had stable vitals but was sinus tachycardia with ketonuria, hypokalemia and microcytic anemia.  Patient was given IV fluids and was admitted to the hospital for further evaluation and treatment.  Assessment & Plan:  Principal Problem:   Generalized weakness Active Problems:   Hypothyroidism   COPD (chronic obstructive pulmonary disease) (HCC)   Essential hypertension   Severe episode of recurrent major depressive disorder, without psychotic features ([place])   Malignant neoplasm of overlapping sites of left breast in female, estrogen receptor positive ([place])   Late onset Alzheimer's disease with behavioral disturbance ([place])   History of CVA (cerebrovascular accident)   Hypokalemia   General weakness  Failure to thrive, generalized weakness: Physical therapy was consulted and recommended skilled nursing facility level of care.  Awaiting for palliative care input regarding further goals of care.    Constipation:  Continue laxatives.  Closely monitor.  Patient states that she did have a bowel movement yesterday.  Dehydration, Improved with IV fluid hydration.  Hypokalemia:  Improved after replacement.  Potassium of 3.7.  Magnesium of  1.9.  Macrocytic anemia due to vitamin B12 deficiency:  Likely secondary to prolonged malnutrition.  Continue IM supplementation for now.  Hypothyroidism:  Continue Synthroid.  Dose was recently increased.  Check TSH and T4 in 4 to 6 weeks   History of breast cancer diagnosis 2018 status postmastectomy declined XRT.  On tamoxifen.  Follows up with Dr. Lindi Adie as outpatient.  Some sclerotic bone lesions metastasis versus hyperparathyroidism versus renal osteodystrophy).   DVT prophylaxis:  Lovenox subcu  Code Status:  Full code  Family Communication:  I tried to reach the patient's daughter [name] on the phone but was unable to reach her.  Spoke with the patient's sister at bedside.  Disposition Plan:  Status is: Inpatient  Remains inpatient appropriate because:Unsafe d/c plan  Dispo: The patient is from: Home              Anticipated d/c is to: SNF              Patient currently is medically stable to d/c.  Await palliative care input   Difficult to place patient No  Consultants:   Palliative care  Procedures:   None  Antimicrobials:  None   Subjective: Today, patient was seen and examined at bedside.  Patient's sister at bedside.  States that that she did have a bowel movement yesterday.  Denies any nausea or vomiting.  Objective: Vitals:   07/23/20 0528 07/23/20 1658 07/23/20 2032 07/24/20 0504  BP: (!) 155/87 (!) 141/67 (!) 150/80 135/72  Pulse: (!) 106 99 95 92  Resp: 18 20 18 18   Temp: 98.6 F (37 C) 98.2 F (36.8 C) 97.6 F (36.4  C) 98.1 F (36.7 C)  TempSrc:  Oral    SpO2: 91% 94% 100% 96%  Weight:      Height:        Intake/Output Summary (Last 24 hours) at 07/24/2020 1136 Last data filed at 07/24/2020 0513 Gross per 24 hour  Intake 1511 ml  Output 1425 ml  Net 86 ml   Filed Weights   07/19/20 2236  Weight: 51.3 kg   Body mass index is 24.47 kg/m.   General:  Average built, not in obvious distress, on nasal cannula oxygen HENT:   No  scleral pallor or icterus noted. Oral mucosa is dry Chest:  Clear breath sounds.  Diminished breath sounds bilaterally. No crackles or wheezes.  CVS: S1 &S2 heard. No murmur.  Regular rate and rhythm. Abdomen: Soft, nontender, nondistended.  Bowel sounds are heard.   Extremities: No cyanosis, clubbing or edema.  Lower extremities with bruises. Psych: Alert, awake and communicative CNS:  No cranial nerve deficits.  Moving all extremities. Skin: Warm and dry.  No rashes noted.   Data Reviewed: I have personally reviewed following labs and imaging studies  CBC: Recent Labs  Lab 07/19/20 2250 07/20/20 0616 07/24/20 0510  WBC 7.4 7.2 5.9  NEUTROABS 6.0  --   --   HGB 11.6* 10.5* 10.2*  HCT 35.6* 33.0* 32.0*  MCV 102.6* 103.8* 104.6*  PLT 260 272 161   Basic Metabolic Panel: Recent Labs  Lab 07/20/20 0616 07/21/20 0444 07/22/20 0548 07/23/20 0523 07/24/20 0510  NA 145 140 138 137 138  K 3.6 4.1 4.4 3.8 3.7  CL 115* 111 108 104 106  CO2 22 23 25 25 26   GLUCOSE 118* 137* 119* 128* 120*  BUN 13 9 6* 9 8  CREATININE 0.63 0.53 0.62 0.72 0.64  CALCIUM 8.3* 7.8* 7.9* 7.9* 7.9*  MG 2.4 2.0  --   --  1.9  PHOS  --   --   --   --  3.1   GFR: Estimated Creatinine Clearance: 41.3 mL/min (by C-G formula based on SCr of 0.64 mg/dL). Liver Function Tests: No results for input(s): AST, ALT, ALKPHOS, BILITOT, PROT, ALBUMIN in the last 168 hours. No results for input(s): LIPASE, AMYLASE in the last 168 hours. Recent Labs  Lab 07/20/20 0616  AMMONIA 23   Coagulation Profile: No results for input(s): INR, PROTIME in the last 168 hours. Cardiac Enzymes: Recent Labs  Lab 07/19/20 2250  CKTOTAL 39   BNP (last 3 results) No results for input(s): PROBNP in the last 8760 hours. HbA1C: No results for input(s): HGBA1C in the last 72 hours. CBG: No results for input(s): GLUCAP in the last 168 hours. Lipid Profile: No results for input(s): CHOL, HDL, LDLCALC, TRIG, CHOLHDL, LDLDIRECT  in the last 72 hours. Thyroid Function Tests: No results for input(s): TSH, T4TOTAL, FREET4, T3FREE, THYROIDAB in the last 72 hours. Anemia Panel: No results for input(s): VITAMINB12, FOLATE, FERRITIN, TIBC, IRON, RETICCTPCT in the last 72 hours. Urine analysis:    Component Value Date/Time   COLORURINE YELLOW 07/19/2020 2240   APPEARANCEUR CLEAR 07/19/2020 2240   LABSPEC 1.017 07/19/2020 2240   PHURINE 6.0 07/19/2020 2240   GLUCOSEU NEGATIVE 07/19/2020 2240   HGBUR SMALL (A) 07/19/2020 2240   HGBUR negative 04/03/2010 1401   BILIRUBINUR NEGATIVE 07/19/2020 2240   BILIRUBINUR NEG 11/13/2014 1455   KETONESUR 80 (A) 07/19/2020 2240   PROTEINUR 30 (A) 07/19/2020 2240   UROBILINOGEN 0.2 11/16/2014 1204   NITRITE NEGATIVE 07/19/2020  [place] (A) 07/19/2020 2240   Recent Results (from the past 240 hour(s))  SARS CORONAVIRUS 2 (TAT 6-24 HRS) Nasopharyngeal Nasopharyngeal Swab     Status: None   Collection Time: 07/15/20 10:32 AM   Specimen: Nasopharyngeal Swab  Result Value Ref Range Status   SARS Coronavirus 2 NEGATIVE NEGATIVE Final    Comment: (NOTE) SARS-CoV-2 target nucleic acids are NOT DETECTED.  The SARS-CoV-2 RNA is generally detectable in upper and lower respiratory specimens during the acute phase of infection. Negative results do not preclude SARS-CoV-2 infection, do not rule out co-infections with other pathogens, and should not be used as the sole basis for treatment or other patient management decisions. Negative results must be combined with clinical observations, patient history, and epidemiological information. The expected result is Negative.  Fact Sheet for Patients: SugarRoll.be  Fact Sheet for Healthcare Providers: https://www.woods-mathews.com/  This test is not yet approved or cleared by the [place] FDA and  has been authorized for detection and/or diagnosis of SARS-CoV-2 by FDA under an  Emergency Use Authorization (EUA). This EUA will remain  in effect (meaning this test can be used) for the duration of the COVID-19 declaration under Se ction 564(b)(1) of the Act, 21 U.S.C. section 360bbb-3(b)(1), unless the authorization is terminated or revoked sooner.  Performed at [facility], [place] [address], [place], [place] [address]   SARS CORONAVIRUS 2 (TAT 6-24 HRS) Nasopharyngeal Nasopharyngeal Swab     Status: None   Collection Time: 07/20/20 12:43 AM   Specimen: Nasopharyngeal Swab  Result Value Ref Range Status   SARS Coronavirus 2 NEGATIVE NEGATIVE Final    Comment: (NOTE) SARS-CoV-2 target nucleic acids are NOT DETECTED.  The SARS-CoV-2 RNA is generally detectable in upper and lower respiratory specimens during the acute phase of infection. Negative results do not preclude SARS-CoV-2 infection, do not rule out co-infections with other pathogens, and should not be used as the sole basis for treatment or other patient management decisions. Negative results must be combined with clinical observations, patient history, and epidemiological information. The expected result is Negative.  Fact Sheet for Patients: SugarRoll.be  Fact Sheet for Healthcare Providers: https://www.woods-mathews.com/  This test is not yet approved or cleared by the [place] FDA and  has been authorized for detection and/or diagnosis of SARS-CoV-2 by FDA under an Emergency Use Authorization (EUA). This EUA will remain  in effect (meaning this test can be used) for the duration of the COVID-19 declaration under Se ction 564(b)(1) of the Act, 21 U.S.C. section 360bbb-3(b)(1), unless the authorization is terminated or revoked sooner.  Performed at [facility], [place] [address], [place], [place] [address]       Radiology Studies: No results found.  Scheduled Meds: . amLODipine  5 mg Oral Daily  . aspirin  325 mg Oral Daily  .  cyanocobalamin  1,000 mcg Intramuscular Daily  . diclofenac Sodium  2 g Topical QID  . enoxaparin (LOVENOX) injection  40 mg Subcutaneous Q24H  . escitalopram  20 mg Oral Daily  . levothyroxine  150 mcg Oral Q0600  . LORazepam  1 mg Oral QHS  . polyethylene glycol  17 g Oral Daily  . senna-docusate  1 tablet Oral BID  . tamoxifen  20 mg Oral Daily   Continuous Infusions: . dextrose 5 % and 0.45% NaCl 75 mL/hr at 07/24/20 0200     LOS: 4 days    Flora Lipps, MD [facility] 07/24/2020, 11:36  AM  

## 2020-07-24 NOTE — Progress Notes (Signed)
Consistently refuses inhaled medications. Order changed to prn only.

## 2020-07-25 DIAGNOSIS — Z8673 Personal history of transient ischemic attack (TIA), and cerebral infarction without residual deficits: Secondary | ICD-10-CM | POA: Diagnosis not present

## 2020-07-25 DIAGNOSIS — R531 Weakness: Secondary | ICD-10-CM | POA: Diagnosis not present

## 2020-07-25 DIAGNOSIS — R627 Adult failure to thrive: Secondary | ICD-10-CM | POA: Diagnosis not present

## 2020-07-25 DIAGNOSIS — C50812 Malignant neoplasm of overlapping sites of left female breast: Secondary | ICD-10-CM | POA: Diagnosis not present

## 2020-07-25 DIAGNOSIS — J449 Chronic obstructive pulmonary disease, unspecified: Secondary | ICD-10-CM | POA: Diagnosis not present

## 2020-07-25 DIAGNOSIS — Z7189 Other specified counseling: Secondary | ICD-10-CM | POA: Diagnosis not present

## 2020-07-25 DIAGNOSIS — I10 Essential (primary) hypertension: Secondary | ICD-10-CM | POA: Diagnosis not present
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
## 2020-07-25 NOTE — TOC Progression Note (Signed)
Transition of Care [facility]) - Progression Note    Patient Details  Name: [name] MRN: [id number] Date of Birth: 02/29/1946  Transition [facility]) CM/SW [place], [place] Phone Number: 07/25/2020, 3:07 PM  Clinical Narrative:   Both attending and palliative MD had discussions with patient about her desire to get better and go to SNF.  Bed search initiated. TOC will continue to follow during the course of hospitalization.     Expected Discharge Plan: Skilled Nursing Facility Barriers to Discharge: SNF Pending bed offer  Expected Discharge Plan and Services Expected Discharge Plan: [place]   Discharge Planning Services: CM Consult Post Acute Care Choice: [place] Living arrangements for the past 2 months: Apartment                                       Social Determinants of Health (SDOH) Interventions    Readmission Risk Interventions No flowsheet data found.

## 2020-07-25 NOTE — NC FL2 (Signed)
[place] LEVEL OF CARE SCREENING TOOL     IDENTIFICATION  Patient Name: [name] Birthdate: 07-05-1945 Sex: female Admission Date (Current Location): 07/19/2020  [facility] and [place] Number:  Herbalist and Address:  [facility],  [place] [address], [place]      Provider Number: [id number]  Attending Physician Name and Address:  Flora Lipps, MD  Relative Name and Phone Number:  Suezanne Jacquet (Daughter)   [phone]    Current Level of Care: Hospital Recommended Level of Care: [place] Prior Approval Number:    Date Approved/Denied:   PASRR Number: [id number]  Discharge Plan: SNF    Current Diagnoses: Patient Active Problem List   Diagnosis Date Noted  . Generalized weakness 07/20/2020  . Hypokalemia 07/20/2020  . General weakness 07/20/2020  . Failure to thrive in adult   . History of CVA (cerebrovascular accident) 07/12/2020  . History of breast cancer 07/12/2020  . CKD (chronic kidney disease) stage 2, GFR 60-89 ml/min 07/12/2020  . Dehydration 07/12/2020  . Dizzy 07/11/2020  . Skin lesion of right leg 04/24/2020  . Inclusion cyst 04/24/2020  . Skin lesion of right arm 08/16/2019  . Sleep disturbance 06/13/2018  . Health education 06/13/2018  . Chronic left hip pain 12/29/2017  . Late onset Alzheimer's disease with behavioral disturbance ([place]) 12/29/2017  . Cancer of left breast, stage 1, estrogen receptor positive ([place]) 05/13/2017  . Malignant neoplasm of overlapping sites of left breast in female, estrogen receptor positive ([place]) 03/08/2017  . Lesion of nose 03/02/2017  . Callus of foot 11/20/2016  . Loss of appetite 12/29/2015  . Fatigue 12/29/2015  . Bipolar affective disorder, currently depressed, moderate ([place]) 11/26/2015  . Depression   . Severe episode of recurrent major depressive disorder, without psychotic features ([place])   . Foreign body in foot, right 01/09/2015  . Blister  of right ankle without infection 01/09/2015  . Sinusitis 11/26/2014  . Rotator cuff syndrome of right shoulder 11/14/2014  . Varicose veins of both lower extremities with pain 10/29/2014  . Neck pain 09/26/2014  . Metatarsal fracture 08/16/2014  . Pain in joint, shoulder region 08/16/2014  . Facial skin lesion 08/01/2014  . Leg edema, left 07/25/2014  . Edema of left lower extremity 07/22/2014  . Essential hypertension   . Thyroid activity decreased   . HLD (hyperlipidemia)   . Healthcare maintenance 05/31/2014  . Emotional neurotic disorder 05/07/2014  . Seborrheic keratosis 12/05/2013  . 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 Mass of left side of neck 10/17/2012  . At high risk for falls 06/28/2012  . Allergic rhinitis 06/16/2012  . Mass of left breast 10/27/2011  . Hearing loss 01/07/2011  . Foot pain 11/25/2010  . Thyroid nodule 10/16/2010  . Nausea 09/22/2010  . URINARY INCONTINENCE, STRESS, FEMALE 04/03/2010  . HYPERLIPIDEMIA 03/25/2010  . PULMONARY NODULE 12/23/2009  . COPD (chronic obstructive pulmonary disease) ([place]) 12/03/2009  . Tobacco abuse counseling 11/05/2009  . CVA 11/05/2009  . BIPOLAR DISORDER UNSPECIFIED 11/28/2008  . Hypothyroidism 05/06/2008  . OCD (obsessive compulsive disorder) 10/13/2006  . GERD 10/13/2006  . OSTEOPOROSIS 10/13/2006    Orientation RESPIRATION BLADDER Height & Weight     Self,Situation,Place  Normal External catheter Weight: 51.3 kg Height:  4\' 9"  (144.8 cm)  BEHAVIORAL SYMPTOMS/MOOD NEUROLOGICAL BOWEL NUTRITION STATUS   (none)  (none) Continent Diet (see d/c summary)  AMBULATORY STATUS COMMUNICATION OF NEEDS Skin     Verbally Normal  Personal Care Assistance Level of Assistance  Bathing,Feeding,Dressing Bathing Assistance: Maximum assistance Feeding assistance: Independent Dressing Assistance: Limited assistance     Functional Limitations Info  Sight,Hearing,Speech Sight Info:  Adequate Hearing Info: Adequate Speech Info: Adequate    SPECIAL CARE FACTORS FREQUENCY  PT (By licensed PT),OT (By licensed OT)     PT Frequency: 5X/W OT Frequency: 5X/W            Contractures      Additional Factors Info  Code Status,Allergies Code Status Info: full Allergies Info: Seroquel, Amoxicillin, Cipro, Haldol, Ketorolac Tromethamine, Latuda, Thorazine, Wellbutrin           Current Medications (07/25/2020):  This is the current hospital active medication list Current Facility-Administered Medications  Medication Dose Route Frequency Provider Last Rate Last Admin  . acetaminophen (TYLENOL) tablet 650 mg  650 mg Oral Q6H PRN Opyd, Ilene Qua, MD       Or  . acetaminophen (TYLENOL) suppository 650 mg  650 mg Rectal Q6H PRN Opyd, Ilene Qua, MD      . amLODipine (NORVASC) tablet 5 mg  5 mg Oral Daily Opyd, Ilene Qua, MD   5 mg at 07/25/20 0919  . aspirin tablet 325 mg  325 mg Oral Daily Opyd, Ilene Qua, MD   325 mg at 07/25/20 [id number]  . bisacodyl (DULCOLAX) suppository 10 mg  10 mg Rectal Daily PRN [name], MD      . cyanocobalamin ((VITAMIN B-12)) injection 1,000 mcg  1,000 mcg Intramuscular Daily [name], MD   1,000 mcg at 07/25/20 0919  . dextrose 5 %-0.45 % sodium chloride infusion   Intravenous Continuous [name], MD 75 mL/hr at 07/25/20 0538 New Bag at 07/25/20 0538  . diclofenac Sodium (VOLTAREN) 1 % topical gel 2 g  2 g Topical QID [name], MD   2 g at 07/25/20 0919  . enoxaparin (LOVENOX) injection 40 mg  40 mg Subcutaneous Q24H Opyd, Ilene Qua, MD   40 mg at 07/25/20 0919  . escitalopram (LEXAPRO) tablet 20 mg  20 mg Oral Daily Opyd, Ilene Qua, MD   20 mg at 07/25/20 0918  . ipratropium-albuterol (DUONEB) 0.5-2.5 (3) MG/3ML nebulizer solution 3 mL  3 mL Nebulization Q6H PRN Opyd, Ilene Qua, MD   3 mL at 07/21/20 2227  . levothyroxine (SYNTHROID) tablet 150 mcg  150 mcg Oral Q0600 Vianne Bulls, MD   150 mcg at 07/25/20 0537  . LORazepam  (ATIVAN) tablet 1 mg  1 mg Oral QHS [name], MD   1 mg at 07/24/20 2104  . ondansetron (ZOFRAN) tablet 4 mg  4 mg Oral Q8H PRN [name], MD   4 mg at 07/24/20 1805  . polyethylene glycol (MIRALAX / GLYCOLAX) packet 17 g  17 g Oral Daily [name], MD   17 g at 07/25/20 [phone]  . promethazine (PHENERGAN) tablet 12.5 mg  12.5 mg Oral Q8H PRN [name], MD   12.5 mg at 07/22/20 1726  . senna-docusate (Senokot-S) tablet 1 tablet  1 tablet Oral BID [name], MD   1 tablet at 07/25/20 0919  . tamoxifen (NOLVADEX) tablet 20 mg  20 mg Oral Daily Opyd, Ilene Qua, MD   20 mg at 07/25/20 0918  . traZODone (DESYREL) tablet 50 mg  50 mg Oral QHS PRN [name], MD   50 mg at 07/24/20 2104     Discharge Medications: Please see discharge summary  for a list of discharge medications.  Relevant Imaging Results:  Relevant Lab Results:   Additional [place], LCSW

## 2020-07-25 NOTE — Progress Notes (Signed)
PHYSICAL THERAPY  Pt resting comfortably this afternoon - did not disturb.  Pt has been evaluated and rec for SNF. Will check back another day and continue to follow during her acute stay.   Rica Koyanagi  PTA Acute  Rehabilitation Services Pager      [phone] Office      [phone]
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
## 2020-07-26 DIAGNOSIS — I10 Essential (primary) hypertension: Secondary | ICD-10-CM | POA: Diagnosis not present

## 2020-07-26 DIAGNOSIS — J449 Chronic obstructive pulmonary disease, unspecified: Secondary | ICD-10-CM | POA: Diagnosis not present

## 2020-07-26 DIAGNOSIS — R531 Weakness: Secondary | ICD-10-CM | POA: Diagnosis not present

## 2020-07-26 DIAGNOSIS — Z8673 Personal history of transient ischemic attack (TIA), and cerebral infarction without residual deficits: Secondary | ICD-10-CM | POA: Diagnosis not present
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
## 2020-07-26 NOTE — Progress Notes (Signed)
Daily Progress Note   Patient Name: [name]       Date: 07/26/2020 DOB: 11/04/1945  Age: 75 y.o. MRN#: [id number] Attending Physician: Flora Lipps, MD Primary Care Physician: Shary Key, DO Admit Date: 07/19/2020  Reason for Consultation/Follow-up: Establishing goals of care  Subjective: I met today with Ms. [name]. She was sleeping but woke up easily.  Reports doing OK but being tired and wanting to sleep.  She again confirmed desire to go to rehab on discharge to see how much functional status she can regain.  I called and discussed briefly with her daughter, Lynelle Smoke as well.  She is in agreement with plan for rehab and has no further questions at this time.  Length of Stay: 6  Current Medications: Scheduled Meds:  . amLODipine  5 mg Oral Daily  . aspirin  325 mg Oral Daily  . cyanocobalamin  1,000 mcg Intramuscular Daily  . diclofenac Sodium  2 g Topical QID  . enoxaparin (LOVENOX) injection  40 mg Subcutaneous Q24H  . escitalopram  20 mg Oral Daily  . levothyroxine  150 mcg Oral Q0600  . LORazepam  1 mg Oral QHS  . polyethylene glycol  17 g Oral Daily  . senna-docusate  1 tablet Oral BID  . tamoxifen  20 mg Oral Daily    Continuous Infusions: . dextrose 5 % and 0.45% NaCl 75 mL/hr at 07/26/20 9090    PRN Meds: acetaminophen **OR** acetaminophen, bisacodyl, ipratropium-albuterol, ondansetron, promethazine, traZODone  Physical Exam     General: Sleepy but arouses easily Heart: Regular rate and rhythm. No murmur appreciated. Lungs: Good air movement, clear Abdomen: Soft, nontender, nondistended, positive bowel sounds.  Ext: No significant edema Skin: Warm and dry Neuro: Grossly intact, nonfocal.       Vital Signs: BP (!) 149/63 (BP Location: Right Arm)    Pulse 86   Temp 98.2 F (36.8 C) (Oral)   Resp 18   Ht 4' 9"  (1.448 m)   Wt 51.3 kg   LMP 01/09/2012   SpO2 100%   BMI 24.47 kg/m  SpO2: SpO2: 100 % O2 Device: O2 Device: Room Air O2 Flow Rate: O2 Flow Rate (L/min): 2 L/min  Intake/output summary:   Intake/Output Summary (Last 24 hours) at 07/26/2020 0826 Last data filed at 07/26/2020 0818 Gross per  24 hour  Intake 4195.55 ml  Output 1900 ml  Net 2295.55 ml   LBM: Last BM Date:  (UTA) Baseline Weight: Weight: 51.3 kg Most recent weight: Weight: 51.3 kg       Palliative Assessment/Data:    Flowsheet Rows   Flowsheet Row Most Recent Value  Intake Tab   Referral Department Hospitalist  Unit at Time of Referral Med/Surg Unit  Palliative Care Primary Diagnosis Neurology  Date Notified 07/22/20  Palliative Care Type New Palliative care  Reason for referral Clarify Goals of Care  Date of Admission 07/19/20  Date first seen by Palliative Care 07/23/20  # of days Palliative referral response time 1 Day(s)  # of days IP prior to Palliative referral 3  Clinical Assessment   Palliative Performance Scale Score 40%  Psychosocial & Spiritual Assessment   Palliative Care Outcomes   Patient/Family meeting held? Yes  Who was at the meeting? Patient, daughter via phone  Palliative Care Outcomes Clarified goals of care      Patient Active Problem List   Diagnosis Date Noted  . Generalized weakness 07/20/2020  . Hypokalemia 07/20/2020  . General weakness 07/20/2020  . Failure to thrive in adult   . History of CVA (cerebrovascular accident) 07/12/2020  . History of breast cancer 07/12/2020  . CKD (chronic kidney disease) stage 2, GFR 60-89 ml/min 07/12/2020  . Dehydration 07/12/2020  . Dizzy 07/11/2020  . Skin lesion of right leg 04/24/2020  . Inclusion cyst 04/24/2020  . Skin lesion of right arm 08/16/2019  . Sleep disturbance 06/13/2018  . Health education 06/13/2018  . Chronic left hip pain 12/29/2017  . Late  onset Alzheimer's disease with behavioral disturbance ([place]) 12/29/2017  . Cancer of left breast, stage 1, estrogen receptor positive ([place]) 05/13/2017  . Malignant neoplasm of overlapping sites of left breast in female, estrogen receptor positive ([place]) 03/08/2017  . Lesion of nose 03/02/2017  . Callus of foot 11/20/2016  . Loss of appetite 12/29/2015  . Fatigue 12/29/2015  . Bipolar affective disorder, currently depressed, moderate ([place]) 11/26/2015  . Depression   . Severe episode of recurrent major depressive disorder, without psychotic features ([place])   . Foreign body in foot, right 01/09/2015  . Blister of right ankle without infection 01/09/2015  . Sinusitis 11/26/2014  . Rotator cuff syndrome of right shoulder 11/14/2014  . Varicose veins of both lower extremities with pain 10/29/2014  . Neck pain 09/26/2014  . Metatarsal fracture 08/16/2014  . Pain in joint, shoulder region 08/16/2014  . Facial skin lesion 08/01/2014  . Leg edema, left 07/25/2014  . Edema of left lower extremity 07/22/2014  . Essential hypertension   . Thyroid activity decreased   . HLD (hyperlipidemia)   . Healthcare maintenance 05/31/2014  . Emotional neurotic disorder 05/07/2014  . Seborrheic keratosis 12/05/2013  . 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 Mass of left side of neck 10/17/2012  . At high risk for falls 06/28/2012  . Allergic rhinitis 06/16/2012  . Mass of left breast 10/27/2011  . Hearing loss 01/07/2011  . Foot pain 11/25/2010  . Thyroid nodule 10/16/2010  . Nausea 09/22/2010  . URINARY INCONTINENCE, STRESS, FEMALE 04/03/2010  . HYPERLIPIDEMIA 03/25/2010  . PULMONARY NODULE 12/23/2009  . COPD (chronic obstructive pulmonary disease) ([place]) 12/03/2009  . Tobacco abuse counseling 11/05/2009  . CVA 11/05/2009  . BIPOLAR DISORDER UNSPECIFIED 11/28/2008  . Hypothyroidism 05/06/2008  . OCD (obsessive compulsive disorder) 10/13/2006  . GERD 10/13/2006  . OSTEOPOROSIS 10/13/2006  Palliative Care Assessment & Plan   Patient Profile:  75 y.o. female  with past medical history of CVA, breast cancer on tamoxifen, depression, hypothyroidism, recently diagnosed hypertension admitted on 07/19/2020 after being found at home by EMS after call for welfare check.  She was recently admitted on 4/8 for dizziness, hypertensive urgency.  She discharged to [facility] for short-term rehab but was there less than 24 hours.  Palliative consulted for goals of care.  Recommendations/Plan:  Full code/full scope  Ms Hilton is invested in plan for continued aggressive care and would like to pursue placement at skilled facility for rehab.  Recommend palliative care to follow-up as outpatient.  No other palliative specific recommendations at this time.  We will no longer round on Ms. Falcon daily.  Please call if there are any palliative specific needs with which we can be of assistance moving forward.  Goals of Care and Additional Recommendations:  Limitations on Scope of Treatment: Full Scope Treatment  Code Status:    Code Status Orders  (From admission, onward)         Start     Ordered   07/20/20 0117  Full code  Continuous        07/20/20 0117        Code Status History    Date Active Date Inactive Code Status Order ID Comments User Context   07/11/2020 2355 07/16/2020 1732 Full Code [id number]  Ileene Musa T, DO ED   05/13/2017 1156 05/14/2017 1344 Full Code [id number]  Excell Seltzer, MD Inpatient   11/25/2015 2052 11/26/2015 1350 Full Code [id number]  Waynetta Pean, PA-C ED   10/08/2015 1756 10/08/2015 2202 Full Code [id number]  Ezequiel Essex, MD ED   04/19/2015 2049 04/21/2015 1415 Full Code [id number]  Marella Chimes, PA-C ED   03/31/2015 2135 04/01/2015 0240 Full Code [id number]  Quintella Reichert, MD ED   06/13/2014 1158 06/15/2014 1612 Full Code [id number]  Coral Spikes, DO Inpatient   09/26/2013 0009 09/26/2013 1436 Full Code [id number]  [facility] ED   Advance  Care Planning Activity       Prognosis:   Unable to determine  Discharge Planning:  [facility] for rehab with Palliative care service follow-up  Care plan was discussed with patient, daughter  Thank you for allowing the Palliative Medicine Team to assist in the care of this patient.   Total Time 20 Prolonged Time Billed No      Greater than 50%  of this time was spent counseling and coordinating care related to the above assessment and plan.  Micheline Rough, MD  Please contact Palliative Medicine Team phone at [phone] for questions and concerns.

## 2020-07-27 DIAGNOSIS — Z8673 Personal history of transient ischemic attack (TIA), and cerebral infarction without residual deficits: Secondary | ICD-10-CM | POA: Diagnosis not present

## 2020-07-27 DIAGNOSIS — I10 Essential (primary) hypertension: Secondary | ICD-10-CM | POA: Diagnosis not present

## 2020-07-27 DIAGNOSIS — J449 Chronic obstructive pulmonary disease, unspecified: Secondary | ICD-10-CM | POA: Diagnosis not present

## 2020-07-27 DIAGNOSIS — R531 Weakness: Secondary | ICD-10-CM | POA: Diagnosis not present

## 2020-07-27 LAB — BASIC METABOLIC PANEL
Anion gap: 8 (ref 5–15)
BUN: <5 mg/dL — ABNORMAL LOW (ref 8–23)
CO2: 32 mmol/L (ref 22–32)
Calcium: 8 mg/dL — ABNORMAL LOW (ref 8.9–10.3)
Chloride: 103 mmol/L (ref 98–111)
Creatinine, Ser: 0.62 mg/dL (ref 0.44–1.00)
GFR, Estimated: >60 mL/min (ref >60)
Glucose, Bld: 114 mg/dL — ABNORMAL HIGH (ref 70–99)
Potassium: 2.7 mmol/L — CL (ref 3.5–5.1)
Sodium: 143 mmol/L (ref 135–145)

## 2020-07-27 LAB — CBC
HCT: 31.1 % — ABNORMAL LOW (ref 36.0–46.0)
Hemoglobin: 9.9 g/dL — ABNORMAL LOW (ref 12.0–15.0)
MCH: 33.2 pg (ref 26.0–34.0)
MCHC: 31.8 g/dL (ref 30.0–36.0)
MCV: 104.4 fL — ABNORMAL HIGH (ref 80.0–100.0)
Platelets: 338 10*3/uL (ref 150–400)
RBC: 2.98 MIL/uL — ABNORMAL LOW (ref 3.87–5.11)
RDW: 12.3 % (ref 11.5–15.5)
WBC: 4.9 10*3/uL (ref 4.0–10.5)
nRBC: 0 % (ref 0.0–0.2)

## 2020-07-27 LAB — PHOSPHORUS: Phosphorus: 3.1 mg/dL (ref 2.5–4.6)

## 2020-07-27 LAB — MAGNESIUM: Magnesium: 1.8 mg/dL (ref 1.7–2.4)

## 2020-07-27 MED ORDER — MAGNESIUM OXIDE 400 (241.3 MG) MG PO TABS
400.0000 mg | ORAL_TABLET | Freq: Two times a day (BID) | ORAL | Status: DC
  Administered 2020-07-27 – 2020-07-29 (×5): 400 mg via ORAL
  Filled 2020-07-27 (×6): qty 1

## 2020-07-27 MED ORDER — POTASSIUM CHLORIDE 10 MEQ/100ML IV SOLN
10.0000 meq | INTRAVENOUS | Status: AC
  Administered 2020-07-27 (×4): 10 meq via INTRAVENOUS
  Filled 2020-07-27: qty 100

## 2020-07-27 MED ORDER — POTASSIUM CHLORIDE CRYS ER 20 MEQ PO TBCR
40.0000 meq | EXTENDED_RELEASE_TABLET | Freq: Once | ORAL | Status: AC
  Administered 2020-07-27: 40 meq via ORAL
  Filled 2020-07-27: qty 2
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
## 2020-07-28 ENCOUNTER — Encounter (HOSPITAL_COMMUNITY): Payer: Self-pay | Admitting: Family Medicine

## 2020-07-28 DIAGNOSIS — Z7189 Other specified counseling: Secondary | ICD-10-CM | POA: Diagnosis not present

## 2020-07-28 DIAGNOSIS — R627 Adult failure to thrive: Secondary | ICD-10-CM | POA: Diagnosis not present

## 2020-07-28 DIAGNOSIS — R531 Weakness: Secondary | ICD-10-CM | POA: Diagnosis not present

## 2020-07-28 DIAGNOSIS — C50812 Malignant neoplasm of overlapping sites of left female breast: Secondary | ICD-10-CM | POA: Diagnosis not present

## 2020-07-28 LAB — BASIC METABOLIC PANEL
Anion gap: 6 (ref 5–15)
BUN: 9 mg/dL (ref 8–23)
CO2: 32 mmol/L (ref 22–32)
Calcium: 8.6 mg/dL — ABNORMAL LOW (ref 8.9–10.3)
Chloride: 102 mmol/L (ref 98–111)
Creatinine, Ser: 0.64 mg/dL (ref 0.44–1.00)
GFR, Estimated: >60 mL/min (ref >60)
Glucose, Bld: 101 mg/dL — ABNORMAL HIGH (ref 70–99)
Potassium: 4.3 mmol/L (ref 3.5–5.1)
Sodium: 140 mmol/L (ref 135–145)

## 2020-07-28 LAB — MAGNESIUM: Magnesium: 2.3 mg/dL (ref 1.7–2.4)

## 2020-07-28 LAB — RESP PANEL BY RT-PCR (FLU A&B, COVID) ARPGX2
Influenza A by PCR: NEGATIVE
Influenza B by PCR: NEGATIVE
SARS Coronavirus 2 by RT PCR: NEGATIVE

## 2020-07-28 MED ORDER — ADULT MULTIVITAMIN W/MINERALS CH
1.0000 | ORAL_TABLET | Freq: Every day | ORAL | Status: DC
  Administered 2020-07-28 – 2020-07-29 (×2): 1 via ORAL
  Filled 2020-07-28 (×2): qty 1

## 2020-07-28 MED ORDER — ENSURE ENLIVE PO LIQD: 237.0000 mL | Freq: Three times a day (TID) | ORAL | Status: DC
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
## 2020-07-28 NOTE — Progress Notes (Signed)
Pt very drowsy today, able to wake to take meds but slept through breakfast and lunch and declined Ensure. Pt encouraged to eat and sit up in bed but declined. Will continue to monitor and encourage activity.

## 2020-07-28 NOTE — Progress Notes (Signed)
PROGRESS NOTE  [name]  [id number] DOB: 07/07/1945 DOA: 07/19/2020 PCP: Shary Key, DO   Brief Narrative:  [name] is a 75 y.o. female with a history of CVA, breast CA on tamoxifen, depression, hypothyroidism with recent increased synthroid dose, recently diagnosed HTN, presented to hospital with worsening weakness and fatigue.  Patient was recently admitted on 4/8 for dizziness, hypertensive urgency and had a negative work-up.  She was then discharged on 07/16/2020 to a [facility] skilled nursing facility for short-term rehab.  Patient's sister had taken her home due to concerns of mistreatment at the facility but at home patient was found to be lying on the floor for around 2 days.  EMS was called in for welfare check.  In the ED, patient had stable vitals but was sinus tachycardia with ketonuria, hypokalemia and microcytic anemia.  Patient was given IV fluids and was admitted to the hospital for further evaluation and treatment.  At this time, patient is awaiting for skilled nursing facility placement.  Assessment & Plan:  Principal Problem:   Generalized weakness Active Problems:   Hypothyroidism   COPD (chronic obstructive pulmonary disease) (HCC)   Essential hypertension   Severe episode of recurrent major depressive disorder, without psychotic features ([place])   Malignant neoplasm of overlapping sites of left breast in female, estrogen receptor positive ([place])   Late onset Alzheimer's disease with behavioral disturbance ([place])   History of CVA (cerebrovascular accident)   Hypokalemia   General weakness  Failure to thrive, generalized weakness: Physical therapy was consulted and recommended skilled nursing facility level of care.  Seen by palliative care and recommend full scope of treatment with outpatient palliative care consideration.  Continue physical therapy.  Constipation:   Improved  Dehydration, Improved with IV fluid hydration.  Encouraged oral  intake  Hypokalemia:  Improved after aggressive repletion.  Potassium 4.3 today  Macrocytic anemia due to vitamin B12 deficiency:  Likely secondary to prolonged malnutrition.  Continue IM supplementation for now.  Hypothyroidism:  Continue Synthroid.  Dose was recently increased.  Check TSH and T4 in 4 to 6 weeks   History of breast cancer diagnosis 2018 status postmastectomy, declined XRT.  On tamoxifen.  Follows up with Dr. Lindi Adie as outpatient.  Some sclerotic bone lesions metastasis versus hyperparathyroidism versus renal osteodystrophy).  Patient is supposed to follow-up with oncology as outpatient.  DVT prophylaxis:  Lovenox subcu  Code Status:  Full code  Family Communication:  None today.  Spoke with the patient's daughter on 4/23.  Disposition Plan:  Status is: Inpatient  Remains inpatient appropriate because:Unsafe d/c plan,   Dispo: The patient is from: Home              Anticipated d/c is to: SNF likely by tomorrow.              Patient currently is medically stable to d/c.     Difficult to place patient No  Consultants:   Palliative care  Procedures:   None  Antimicrobials:  None   Subjective:  Today, patient was seen and examined at bedside.  Denies overt complaints.  Denies nausea vomiting fever chills  Objective: Vitals:   07/27/20 0523 07/27/20 1344 07/27/20 2144 07/28/20 0526  BP: (!) 150/79 (!) 141/75 (!) 150/81 138/74  Pulse: 90 87 96 77  Resp: 20 15 18 16   Temp: 97.8 F (36.6 C) 98.6 F (37 C) 98.1 F (36.7 C) (!) 97.4 F (36.3 C)  TempSrc: Oral  Oral Oral  SpO2: 98% 99% 99%   Weight:      Height:        Intake/Output Summary (Last 24 hours) at 07/28/2020 1223 Last data filed at 07/28/2020 0533 Gross per 24 hour  Intake 1200 ml  Output 850 ml  Net 350 ml   Filed Weights   07/19/20 2236  Weight: 51.3 kg   Body mass index is 24.47 kg/m.   General:  Average built, not in obvious distress, on nasal cannula oxygen, mildly  somnolent HENT:   No scleral pallor or icterus noted. Oral mucosa is mildly dry Chest:  Diminished breath sounds bilaterally. CVS: S1 &S2 heard. No murmur.  Regular rate and rhythm. Abdomen: Soft, nontender, nondistended.  Bowel sounds are heard.   Extremities: No cyanosis, clubbing or edema.  Peripheral pulses are palpable. Psych: Alert, awake and oriented to place and person.  Communicative. CNS:  No cranial nerve deficits.  Moves extremities..   Skin: Warm and dry.  Scattered ecchymosis on the extremities.  Data Reviewed: I have personally reviewed following labs and imaging studies  CBC: Recent Labs  Lab 07/24/20 0510 07/27/20 0520  WBC 5.9 4.9  HGB 10.2* 9.9*  HCT 32.0* 31.1*  MCV 104.6* 104.4*  PLT 331 175   Basic Metabolic Panel: Recent Labs  Lab 07/22/20 0548 07/23/20 0523 07/24/20 0510 07/27/20 0520 07/28/20 0605  NA 138 137 138 143 140  K 4.4 3.8 3.7 2.7* 4.3  CL 108 104 106 103 102  CO2 25 25 26  32 32  GLUCOSE 119* 128* 120* 114* 101*  BUN 6* 9 8 <5* 9  CREATININE 0.62 0.72 0.64 0.62 0.64  CALCIUM 7.9* 7.9* 7.9* 8.0* 8.6*  MG  --   --  1.9 1.8 2.3  PHOS  --   --  3.1 3.1  --    GFR: Estimated Creatinine Clearance: 41.3 mL/min (by C-G formula based on SCr of 0.64 mg/dL). Liver Function Tests: No results for input(s): AST, ALT, ALKPHOS, BILITOT, PROT, ALBUMIN in the last 168 hours. No results for input(s): LIPASE, AMYLASE in the last 168 hours. No results for input(s): AMMONIA in the last 168 hours. Coagulation Profile: No results for input(s): INR, PROTIME in the last 168 hours. Cardiac Enzymes: No results for input(s): CKTOTAL, CKMB, CKMBINDEX, TROPONINI in the last 168 hours. BNP (last 3 results) No results for input(s): PROBNP in the last 8760 hours. HbA1C: No results for input(s): HGBA1C in the last 72 hours. CBG: No results for input(s): GLUCAP in the last 168 hours. Lipid Profile: No results for input(s): CHOL, HDL, LDLCALC, TRIG, CHOLHDL,  LDLDIRECT in the last 72 hours. Thyroid Function Tests: No results for input(s): TSH, T4TOTAL, FREET4, T3FREE, THYROIDAB in the last 72 hours. Anemia Panel: No results for input(s): VITAMINB12, FOLATE, FERRITIN, TIBC, IRON, RETICCTPCT in the last 72 hours. Urine analysis:    Component Value Date/Time   COLORURINE YELLOW 07/19/2020 2240   APPEARANCEUR CLEAR 07/19/2020 2240   LABSPEC 1.017 07/19/2020 2240   PHURINE 6.0 07/19/2020 2240   GLUCOSEU NEGATIVE 07/19/2020 2240   HGBUR SMALL (A) 07/19/2020 2240   HGBUR negative 04/03/2010 1401   BILIRUBINUR NEGATIVE 07/19/2020 2240   BILIRUBINUR NEG 11/13/2014 1455   KETONESUR 80 (A) 07/19/2020 2240   PROTEINUR 30 (A) 07/19/2020 2240   UROBILINOGEN 0.2 11/16/2014 1204   NITRITE NEGATIVE 07/19/2020 2240   LEUKOCYTESUR TRACE (A) 07/19/2020 2240   Recent Results (from the past 240 hour(s))  SARS CORONAVIRUS 2 (TAT 6-24 HRS) Nasopharyngeal Nasopharyngeal Swab  Status: None   Collection Time: 07/20/20 12:43 AM   Specimen: Nasopharyngeal Swab  Result Value Ref Range Status   SARS Coronavirus 2 NEGATIVE NEGATIVE Final    Comment: (NOTE) SARS-CoV-2 target nucleic acids are NOT DETECTED.  The SARS-CoV-2 RNA is generally detectable in upper and lower respiratory specimens during the acute phase of infection. Negative results do not preclude SARS-CoV-2 infection, do not rule out co-infections with other pathogens, and should not be used as the sole basis for treatment or other patient management decisions. Negative results must be combined with clinical observations, patient history, and epidemiological information. The expected result is Negative.  Fact Sheet for Patients: SugarRoll.be  Fact Sheet for Healthcare Providers: https://www.woods-mathews.com/  This test is not yet approved or cleared by the [place] FDA and  has been authorized for detection and/or diagnosis of SARS-CoV-2 by FDA  under an Emergency Use Authorization (EUA). This EUA will remain  in effect (meaning this test can be used) for the duration of the COVID-19 declaration under Se ction 564(b)(1) of the Act, 21 U.S.C. section 360bbb-3(b)(1), unless the authorization is terminated or revoked sooner.  Performed at [facility], [place] [address], [place], [place] [address]   Resp Panel by RT-PCR (Flu A&B, Covid) Nasopharyngeal Swab     Status: None   Collection Time: 07/28/20  9:50 AM   Specimen: Nasopharyngeal Swab; Nasopharyngeal(NP) swabs in vial transport medium  Result Value Ref Range Status   SARS Coronavirus 2 by RT PCR NEGATIVE NEGATIVE Final    Comment: (NOTE) SARS-CoV-2 target nucleic acids are NOT DETECTED.  The SARS-CoV-2 RNA is generally detectable in upper respiratory specimens during the acute phase of infection. The lowest concentration of SARS-CoV-2 viral copies this assay can detect is 138 copies/mL. A negative result does not preclude SARS-Cov-2 infection and should not be used as the sole basis for treatment or other patient management decisions. A negative result may occur with  improper specimen collection/handling, submission of specimen other than nasopharyngeal swab, presence of viral mutation(s) within the areas targeted by this assay, and inadequate number of viral copies(<138 copies/mL). A negative result must be combined with clinical observations, patient history, and epidemiological information. The expected result is Negative.  Fact Sheet for Patients:  EntrepreneurPulse.com.au  Fact Sheet for Healthcare Providers:  IncredibleEmployment.be  This test is no t yet approved or cleared by the [place] FDA and  has been authorized for detection and/or diagnosis of SARS-CoV-2 by FDA under an Emergency Use Authorization (EUA). This EUA will remain  in effect (meaning this test can be used) for the duration of the COVID-19  declaration under Section 564(b)(1) of the Act, 21 U.S.C.section 360bbb-3(b)(1), unless the authorization is terminated  or revoked sooner.       Influenza A by PCR NEGATIVE NEGATIVE Final   Influenza B by PCR NEGATIVE NEGATIVE Final    Comment: (NOTE) The Xpert Xpress SARS-CoV-2/FLU/RSV plus assay is intended as an aid in the diagnosis of influenza from Nasopharyngeal swab specimens and should not be used as a sole basis for treatment. Nasal washings and aspirates are unacceptable for Xpert Xpress SARS-CoV-2/FLU/RSV testing.  Fact Sheet for Patients: EntrepreneurPulse.com.au  Fact Sheet for Healthcare Providers: IncredibleEmployment.be  This test is not yet approved or cleared by the [place] FDA and has been authorized for detection and/or diagnosis of SARS-CoV-2 by FDA under an Emergency Use Authorization (EUA). This EUA will remain in effect (meaning this test can be used) for the duration of  the COVID-19 declaration under Section 564(b)(1) of the Act, 21 U.S.C. section 360bbb-3(b)(1), unless the authorization is terminated or revoked.  Performed at [facility], [place] [address], [place], [place] [address]       Radiology Studies: No results found.  Scheduled Meds: . amLODipine  5 mg Oral Daily  . aspirin  325 mg Oral Daily  . cyanocobalamin  1,000 mcg Intramuscular Daily  . diclofenac Sodium  2 g Topical QID  . enoxaparin (LOVENOX) injection  40 mg Subcutaneous Q24H  . escitalopram  20 mg Oral Daily  . feeding supplement  237 mL Oral TID BM  . levothyroxine  150 mcg Oral Q0600  . LORazepam  1 mg Oral QHS  . magnesium oxide  400 mg Oral BID  . multivitamin with minerals  1 tablet Oral Daily  . polyethylene glycol  17 g Oral Daily  . senna-docusate  1 tablet Oral BID  . tamoxifen  20 mg Oral Daily   Continuous Infusions:    LOS: 8 days    Flora Lipps, MD [facility] 07/28/2020, 12:23 PM

## 2020-07-28 NOTE — Consult Note (Signed)
   [facility] Inpatient Consult   07/28/2020  [name] 1946/01/22 [id number]   Patient chart has been reviewed for readmissions less than 30 days and for high risk score for unplanned readmissions.  Patient assessed for community [place] Management follow up needs.   Per review, current recommendation is for skilled nursing facility. No [facility] CM follow up needs identified at this time.  Of note, [facility] Care Management services does not replace or interfere with any services that are arranged by inpatient case management or social work.  Netta Cedars, MSN, [facility] Liaison Nurse Mobile Phone [phone]  Toll free office [phone]

## 2020-07-28 NOTE — Care Management Important Message (Signed)
Important Message  Patient Details IM Letter given to the Patient. Name: [name] MRN: [id number] Date of Birth: 1945/10/20   Medicare Important Message Given:  Yes     Kerin Salen 07/28/2020, 11:13 AM

## 2020-07-28 NOTE — Progress Notes (Signed)
Pt given a bath by Airline pilot. Pt much more alert and open to eating. Meal ordered and received, pt attempting to eat. Will continue to monitor.

## 2020-07-28 NOTE — Discharge Instructions (Signed)
Nutrition Post Hospital Stay °Proper nutrition can help your body recover from illness and injury.   °Foods and beverages high in protein, vitamins, and minerals help rebuild muscle loss, promote healing, & reduce fall risk.  ° °In addition to eating healthy foods, a nutrition shake is an easy, delicious way to get the nutrition you need during and after your hospital stay ° °It is recommended that you continue to drink 3 bottles per day of: Ensure for at least 1 month (30 days) after your hospital stay  ° °Tips for adding a nutrition shake into your routine: °As allowed, drink one with vitamins or medications instead of water or juice °Enjoy one as a tasty mid-morning or afternoon snack °Drink cold or make a milkshake out of it °Drink one instead of milk with cereal or snacks °Use as a coffee creamer °  °Available at the following grocery stores and pharmacies:           °* [employer] * [employer] * [employer]co  °* Rite Aid          * Walmart * [employer] Club  °* Walgreens      * Target  * BJ's   °* CVS  * [employer]   °* [facility] [phone]  °          °For COUPONS visit: www.ensure.com/join or www.boost.com/members/sign-up  ° °Suggested Substitutions °Ensure Plus = Boost Plus = Carnation Breakfast Essentials = Boost Compact °Ensure Active Clear = Boost Breeze °Glucerna Shake = Boost Glucose Control = Carnation Breakfast Essentials SUGAR FREE °

## 2020-07-28 NOTE — Progress Notes (Signed)
Initial Nutrition Assessment  DOCUMENTATION CODES:  Not applicable  INTERVENTION:  Add Ensure Enlive po TID, each supplement provides 350 kcal and 20 grams of protein.  Add Magic cup TID with meals, each supplement provides 290 kcal and 9 grams of protein.  Add MVI with minerals daily.  NUTRITION DIAGNOSIS:  Inadequate oral intake related to poor appetite,nausea as evidenced by meal completion < 25%,per patient/family report.  GOAL:  Patient will meet greater than or equal to 90% of their needs  MONITOR:  PO intake,Supplement acceptance,Labs,Weight trends,I & O's  REASON FOR ASSESSMENT:  Malnutrition Screening Tool    ASSESSMENT:  75 yo female with a PMH of breast cancer diagnosed in 2018 s/p left mastectomy, radiation and currently on tamoxifen, hypertension, CVA, hypothyroidism, COPD, asthma, CKD stage II, and GERD who presents with generalized weakness. Medically stable, awaiting discharge to SNF.  Pt asleep on RD visits x2. On second visit, RD performed NFPE. Found no remarkable depletions. Pt eating variably - 0-100%, mostly 25%, per Epic. Per previous RD note from 4/11 from prior admission, pt reports not having much of an appetite in the hospital. Noted during visit, RD saw breakfast tray completely untouched.  Per MD notes, pt has had poor appetite and struggling with sleepiness.  Per Epic, weight has steadily increased since 2019.  Recommend adding Ensure TID and Magic Cup TID to promote caloric and protein intake. Also recommend MVI with minerals daily.  Medications: Vitamin B12 100 mcg injection, Synthroid, Mag-Ox BID, Senokot BID, miralax Labs: reviewed; Glucose 101  NUTRITION - FOCUSED PHYSICAL EXAM: Flowsheet Row Most Recent Value  Orbital Region No depletion  Upper Arm Region No depletion  Thoracic and Lumbar Region No depletion  Buccal Region No depletion  Temple Region No depletion  Clavicle Bone Region No depletion  Clavicle and Acromion Bone Region No  depletion  Scapular Bone Region No depletion  Dorsal Hand No depletion  Patellar Region No depletion  Anterior Thigh Region No depletion  Posterior Calf Region No depletion  Edema (RD Assessment) None  Hair Reviewed  Eyes Reviewed  Mouth Reviewed  Skin Reviewed  Nails Reviewed     Diet Order:   Diet Order            Diet Heart Room service appropriate? Yes; Fluid consistency: Thin  Diet effective now                EDUCATION NEEDS:  No education needs have been identified at this time  Skin:  Skin Assessment: Reviewed RN Assessment  Last BM:  UTA  Height:  Ht Readings from Last 1 Encounters:  07/19/20 4\' 9"  (1.448 m)   Weight:  Wt Readings from Last 1 Encounters:  07/19/20 51.3 kg   Ideal Body Weight:  43.2 kg  BMI:  Body mass index is 24.47 kg/m.  Estimated Nutritional Needs:  Kcal:  1350-1550 Protein:  60-75 grams Fluid:  >1.6 L  Derrel Nip, RD, LDN Registered Dietitian After Hours/Weekend Pager # in [place]

## 2020-07-29 DIAGNOSIS — R262 Difficulty in walking, not elsewhere classified: Secondary | ICD-10-CM | POA: Diagnosis not present

## 2020-07-29 DIAGNOSIS — N390 Urinary tract infection, site not specified: Secondary | ICD-10-CM | POA: Diagnosis not present

## 2020-07-29 DIAGNOSIS — G459 Transient cerebral ischemic attack, unspecified: Secondary | ICD-10-CM | POA: Diagnosis not present

## 2020-07-29 DIAGNOSIS — G4700 Insomnia, unspecified: Secondary | ICD-10-CM | POA: Diagnosis not present

## 2020-07-29 DIAGNOSIS — B9689 Other specified bacterial agents as the cause of diseases classified elsewhere: Secondary | ICD-10-CM | POA: Diagnosis not present

## 2020-07-29 DIAGNOSIS — F429 Obsessive-compulsive disorder, unspecified: Secondary | ICD-10-CM | POA: Diagnosis not present

## 2020-07-29 DIAGNOSIS — G301 Alzheimer's disease with late onset: Secondary | ICD-10-CM | POA: Diagnosis not present

## 2020-07-29 DIAGNOSIS — Z950 Presence of cardiac pacemaker: Secondary | ICD-10-CM | POA: Diagnosis not present

## 2020-07-29 DIAGNOSIS — I6350 Cerebral infarction due to unspecified occlusion or stenosis of unspecified cerebral artery: Secondary | ICD-10-CM | POA: Diagnosis not present

## 2020-07-29 DIAGNOSIS — F32A Depression, unspecified: Secondary | ICD-10-CM | POA: Diagnosis not present

## 2020-07-29 DIAGNOSIS — F0281 Dementia in other diseases classified elsewhere with behavioral disturbance: Secondary | ICD-10-CM | POA: Diagnosis not present

## 2020-07-29 DIAGNOSIS — R531 Weakness: Secondary | ICD-10-CM | POA: Diagnosis not present

## 2020-07-29 DIAGNOSIS — R279 Unspecified lack of coordination: Secondary | ICD-10-CM | POA: Diagnosis not present

## 2020-07-29 DIAGNOSIS — E860 Dehydration: Secondary | ICD-10-CM | POA: Diagnosis not present

## 2020-07-29 DIAGNOSIS — K529 Noninfective gastroenteritis and colitis, unspecified: Secondary | ICD-10-CM | POA: Diagnosis not present

## 2020-07-29 DIAGNOSIS — F332 Major depressive disorder, recurrent severe without psychotic features: Secondary | ICD-10-CM | POA: Diagnosis not present

## 2020-07-29 DIAGNOSIS — I10 Essential (primary) hypertension: Secondary | ICD-10-CM | POA: Diagnosis not present

## 2020-07-29 DIAGNOSIS — Z8673 Personal history of transient ischemic attack (TIA), and cerebral infarction without residual deficits: Secondary | ICD-10-CM | POA: Diagnosis not present

## 2020-07-29 DIAGNOSIS — R1032 Left lower quadrant pain: Secondary | ICD-10-CM | POA: Diagnosis present

## 2020-07-29 DIAGNOSIS — M6281 Muscle weakness (generalized): Secondary | ICD-10-CM | POA: Diagnosis not present

## 2020-07-29 DIAGNOSIS — Z7982 Long term (current) use of aspirin: Secondary | ICD-10-CM | POA: Diagnosis not present

## 2020-07-29 DIAGNOSIS — C50812 Malignant neoplasm of overlapping sites of left female breast: Secondary | ICD-10-CM | POA: Diagnosis not present

## 2020-07-29 DIAGNOSIS — Z853 Personal history of malignant neoplasm of breast: Secondary | ICD-10-CM | POA: Diagnosis not present

## 2020-07-29 DIAGNOSIS — C50912 Malignant neoplasm of unspecified site of left female breast: Secondary | ICD-10-CM | POA: Diagnosis not present

## 2020-07-29 DIAGNOSIS — G4701 Insomnia due to medical condition: Secondary | ICD-10-CM | POA: Diagnosis not present

## 2020-07-29 DIAGNOSIS — D519 Vitamin B12 deficiency anemia, unspecified: Secondary | ICD-10-CM | POA: Diagnosis not present

## 2020-07-29 DIAGNOSIS — R9389 Abnormal findings on diagnostic imaging of other specified body structures: Secondary | ICD-10-CM | POA: Diagnosis not present

## 2020-07-29 DIAGNOSIS — Z515 Encounter for palliative care: Secondary | ICD-10-CM | POA: Diagnosis not present

## 2020-07-29 DIAGNOSIS — R4182 Altered mental status, unspecified: Secondary | ICD-10-CM | POA: Diagnosis not present

## 2020-07-29 DIAGNOSIS — E876 Hypokalemia: Secondary | ICD-10-CM | POA: Diagnosis not present

## 2020-07-29 DIAGNOSIS — D3500 Benign neoplasm of unspecified adrenal gland: Secondary | ICD-10-CM | POA: Diagnosis not present

## 2020-07-29 DIAGNOSIS — I129 Hypertensive chronic kidney disease with stage 1 through stage 4 chronic kidney disease, or unspecified chronic kidney disease: Secondary | ICD-10-CM | POA: Diagnosis not present

## 2020-07-29 DIAGNOSIS — F0150 Vascular dementia without behavioral disturbance: Secondary | ICD-10-CM | POA: Diagnosis not present

## 2020-07-29 DIAGNOSIS — R109 Unspecified abdominal pain: Secondary | ICD-10-CM | POA: Diagnosis not present

## 2020-07-29 DIAGNOSIS — K5900 Constipation, unspecified: Secondary | ICD-10-CM | POA: Diagnosis not present

## 2020-07-29 DIAGNOSIS — R630 Anorexia: Secondary | ICD-10-CM | POA: Diagnosis not present

## 2020-07-29 DIAGNOSIS — K219 Gastro-esophageal reflux disease without esophagitis: Secondary | ICD-10-CM | POA: Diagnosis not present

## 2020-07-29 DIAGNOSIS — E039 Hypothyroidism, unspecified: Secondary | ICD-10-CM | POA: Diagnosis not present

## 2020-07-29 DIAGNOSIS — R110 Nausea: Secondary | ICD-10-CM | POA: Diagnosis not present

## 2020-07-29 DIAGNOSIS — Z87891 Personal history of nicotine dependence: Secondary | ICD-10-CM | POA: Diagnosis not present

## 2020-07-29 DIAGNOSIS — N3001 Acute cystitis with hematuria: Secondary | ICD-10-CM | POA: Diagnosis not present

## 2020-07-29 DIAGNOSIS — N182 Chronic kidney disease, stage 2 (mild): Secondary | ICD-10-CM | POA: Diagnosis not present

## 2020-07-29 DIAGNOSIS — N3289 Other specified disorders of bladder: Secondary | ICD-10-CM | POA: Diagnosis not present

## 2020-07-29 DIAGNOSIS — I639 Cerebral infarction, unspecified: Secondary | ICD-10-CM | POA: Diagnosis not present

## 2020-07-29 DIAGNOSIS — R278 Other lack of coordination: Secondary | ICD-10-CM | POA: Diagnosis not present

## 2020-07-29 DIAGNOSIS — J449 Chronic obstructive pulmonary disease, unspecified: Secondary | ICD-10-CM | POA: Diagnosis not present

## 2020-07-29 DIAGNOSIS — F319 Bipolar disorder, unspecified: Secondary | ICD-10-CM | POA: Diagnosis not present

## 2020-07-29 DIAGNOSIS — N281 Cyst of kidney, acquired: Secondary | ICD-10-CM | POA: Diagnosis not present

## 2020-07-29 DIAGNOSIS — Z170 Estrogen receptor positive status [ER+]: Secondary | ICD-10-CM | POA: Diagnosis not present

## 2020-07-29 DIAGNOSIS — Z85528 Personal history of other malignant neoplasm of kidney: Secondary | ICD-10-CM | POA: Diagnosis not present

## 2020-07-29 DIAGNOSIS — R293 Abnormal posture: Secondary | ICD-10-CM | POA: Diagnosis not present

## 2020-07-29 DIAGNOSIS — R1084 Generalized abdominal pain: Secondary | ICD-10-CM | POA: Diagnosis not present

## 2020-07-29 DIAGNOSIS — Z743 Need for continuous supervision: Secondary | ICD-10-CM | POA: Diagnosis not present

## 2020-07-29 DIAGNOSIS — R627 Adult failure to thrive: Secondary | ICD-10-CM | POA: Diagnosis not present

## 2020-07-29 DIAGNOSIS — R404 Transient alteration of awareness: Secondary | ICD-10-CM | POA: Diagnosis not present

## 2020-07-29 DIAGNOSIS — Z79899 Other long term (current) drug therapy: Secondary | ICD-10-CM | POA: Diagnosis not present

## 2020-07-29 DIAGNOSIS — K3189 Other diseases of stomach and duodenum: Secondary | ICD-10-CM | POA: Diagnosis not present

## 2020-07-29 MED ORDER — ADULT MULTIVITAMIN W/MINERALS CH: 1.0000 | ORAL_TABLET | Freq: Every day | ORAL | Status: DC

## 2020-07-29 MED ORDER — TRAZODONE HCL 50 MG PO TABS: 50.0000 mg | ORAL_TABLET | Freq: Every day | ORAL | Status: DC

## 2020-07-29 MED ORDER — VITAMIN B-12 1000 MCG PO TABS: 1000.0000 ug | ORAL_TABLET | Freq: Every day | ORAL | Status: DC

## 2020-07-29 MED ORDER — LEVOTHYROXINE SODIUM 150 MCG PO TABS: 150.0000 ug | ORAL_TABLET | ORAL | Status: DC

## 2020-07-29 MED ORDER — DICLOFENAC SODIUM 1 % EX GEL: 2.0000 g | Freq: Four times a day (QID) | CUTANEOUS | Status: DC

## 2020-07-29 MED ORDER — ONDANSETRON HCL 4 MG PO TABS: 4.0000 mg | ORAL_TABLET | Freq: Three times a day (TID) | ORAL | 0 refills | Status: AC | PRN

## 2020-07-29 MED ORDER — POLYETHYLENE GLYCOL 3350 17 G PO PACK: 17.0000 g | PACK | Freq: Every day | ORAL | 0 refills | Status: AC

## 2020-07-29 MED ORDER — BISACODYL 10 MG RE SUPP: 10.0000 mg | Freq: Every day | RECTAL | 0 refills | Status: DC | PRN

## 2020-07-29 MED ORDER — ENSURE ENLIVE PO LIQD: 237.0000 mL | Freq: Three times a day (TID) | ORAL | Status: AC

## 2020-07-29 NOTE — Progress Notes (Signed)
Attempted to call report to [facility], nurse was unavailable. Left message for nurse to return my call.

## 2020-07-29 NOTE — TOC Transition Note (Signed)
Transition of Care [facility]) - CM/SW Discharge Note   Patient Details  Name: [name] MRN: [id number] Date of Birth: [birth year]  [facility] CM/SW Contact:  Trish Mage, LCSW Phone Number: 07/29/2020, 11:22 AM   Clinical Narrative:   Patient who is stable for d/c will transfer to [facility] today. Family alerted.  PTAR arranged.  Nursing, please call report to [phone], room 3214. TOC sign off.    Final next level of care: Skilled Nursing Facility Barriers to Discharge: Barriers Resolved   Patient Goals and CMS Choice   CMS Medicare.gov Compare Post Acute Care list provided to:: Patient Choice offered to / list presented to : Patient  Discharge Placement                       Discharge Plan and Services   Discharge Planning Services: CM Consult Post Acute Care Choice: [place]                               Social Determinants of Health (SDOH) Interventions     Readmission Risk Interventions No flowsheet data found.
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
## 2020-07-30 DIAGNOSIS — M6281 Muscle weakness (generalized): Secondary | ICD-10-CM | POA: Diagnosis not present

## 2020-07-30 DIAGNOSIS — F32A Depression, unspecified: Secondary | ICD-10-CM | POA: Diagnosis not present

## 2020-07-30 DIAGNOSIS — C50912 Malignant neoplasm of unspecified site of left female breast: Secondary | ICD-10-CM | POA: Diagnosis not present

## 2020-07-30 DIAGNOSIS — E860 Dehydration: Secondary | ICD-10-CM | POA: Diagnosis not present

## 2020-07-30 DIAGNOSIS — D519 Vitamin B12 deficiency anemia, unspecified: Secondary | ICD-10-CM | POA: Diagnosis not present

## 2020-07-30 DIAGNOSIS — I10 Essential (primary) hypertension: Secondary | ICD-10-CM | POA: Diagnosis not present

## 2020-07-30 DIAGNOSIS — R110 Nausea: Secondary | ICD-10-CM | POA: Diagnosis not present

## 2020-07-30 DIAGNOSIS — G301 Alzheimer's disease with late onset: Secondary | ICD-10-CM | POA: Diagnosis not present

## 2020-07-30 DIAGNOSIS — K5900 Constipation, unspecified: Secondary | ICD-10-CM | POA: Diagnosis not present

## 2020-07-30 DIAGNOSIS — I639 Cerebral infarction, unspecified: Secondary | ICD-10-CM | POA: Diagnosis not present

## 2020-07-30 DIAGNOSIS — R627 Adult failure to thrive: Secondary | ICD-10-CM | POA: Diagnosis not present

## 2020-07-30 DIAGNOSIS — J449 Chronic obstructive pulmonary disease, unspecified: Secondary | ICD-10-CM | POA: Diagnosis not present

## 2020-07-30 DIAGNOSIS — E039 Hypothyroidism, unspecified: Secondary | ICD-10-CM | POA: Diagnosis not present

## 2020-08-05 DIAGNOSIS — I10 Essential (primary) hypertension: Secondary | ICD-10-CM | POA: Diagnosis not present

## 2020-08-05 DIAGNOSIS — R627 Adult failure to thrive: Secondary | ICD-10-CM | POA: Diagnosis not present

## 2020-08-05 DIAGNOSIS — G4700 Insomnia, unspecified: Secondary | ICD-10-CM | POA: Diagnosis not present

## 2020-08-05 DIAGNOSIS — J449 Chronic obstructive pulmonary disease, unspecified: Secondary | ICD-10-CM | POA: Diagnosis not present

## 2020-08-05 DIAGNOSIS — M6281 Muscle weakness (generalized): Secondary | ICD-10-CM | POA: Diagnosis not present

## 2020-08-05 DIAGNOSIS — C50912 Malignant neoplasm of unspecified site of left female breast: Secondary | ICD-10-CM | POA: Diagnosis not present

## 2020-08-07 ENCOUNTER — Other Ambulatory Visit: Payer: Self-pay | Admitting: *Deleted

## 2020-08-07 NOTE — Patient Outreach (Signed)
Member screened for potential [facility] Care Management needs. Ms. Shryock resides in [facility].   Confidential voicemail message left for [facility] SW to inquire about transition plans. Requested return call.   Will continue to follow while member resides in SNF.    Marthenia Rolling, MSN, RN,BSN [place] Acute Care Coordinator [phone] [facility]) [phone]  (Toll free office)

## 2020-08-12 NOTE — Assessment & Plan Note (Deleted)
05/13/2017:Left simple mastectomy: IDC grade 2, 7.7 cm, DCIS intermediate grade, lymphovascular invasion identified, perineural invasion present, margins negative, 1/2 lymph nodes positive, ER 6200% strong, PR 0%, HER-2 negative, Ki-67 5%, T3N1 a stage IIIa Has major depression: On Lexapro  Sleeps all day and wakes up all night. Severe fatigue limiting all activities Did not respond to multiple calls Profound weight loss.  Taking Tamoxifen: No Adverse effects Hospitalization: 07/19/20- 07/29/20: Worsening fatigue, HTN urgency, B 12 def,  

## 2020-08-12 NOTE — Progress Notes (Incomplete)
Patient Care Team: Shary Key, DO as PCP - General (Family Medicine) Magrinat, Virgie Dad, MD as Consulting Physician (Oncology) Eppie Gibson, MD as Attending Physician (Radiation Oncology) Erroll Luna, MD as Consulting Physician (General Surgery)  DIAGNOSIS: No diagnosis found.  SUMMARY OF ONCOLOGIC HISTORY: Oncology History  Malignant neoplasm of overlapping sites of left breast in female, estrogen receptor positive ([place])  03/04/2017 Initial Diagnosis   Palpable left breast masses with skin thickening, by ultrasound 2 masses were noted at 5:00 2.1 cm and at 1:00 3.5 cm, biopsy of both of these lesions grade 2 invasive ductal carcinoma with lymphovascular invasion and perineural invasion, ER 100%, PR 0%, HER-2 negative ratio 1.03, Ki-67 5%, T2 N0 stage II a clinical stage   05/13/2017 Surgery   Left simple mastectomy: IDC grade 2, 7.7 cm, DCIS intermediate grade, lymphovascular invasion identified, perineural invasion present, margins negative, 1/2 lymph nodes positive, ER 6200% strong, PR 0%, HER-2 negative, Ki-67 5%, T3N1 a stage IIIa    Radiation Therapy   declined   05/24/2017 -  Anti-estrogen oral therapy   Tamoxifen 53m daily     CHIEF COMPLIANT: Follow-up of left breast cancer on tamoxifen   INTERVAL HISTORY: Dana ZARAis a 75y.o. with above-mentioned history of left breast cancer currently on tamoxifen. CT on 07/11/20 showed diffuse sclerotic changes within the axial skeleton concerning for metastatic disease. She presents to the clinic today for follow-up.   ALLERGIES:  is allergic to seroquel [quetiapine fumarate], amoxicillin, ciprofloxacin, haloperidol lactate, ketorolac tromethamine, latuda [lurasidone hcl], thorazine [chlorpromazine], and wellbutrin [bupropion].  MEDICATIONS:  Current Outpatient Medications  Medication Sig Dispense Refill  . amLODipine (NORVASC) 5 MG tablet Take 1 tablet (5 mg total) by mouth daily. 30 tablet 11  . aspirin 325 MG  tablet Take 325 mg by mouth daily.    . bisacodyl (DULCOLAX) 10 MG suppository Place 1 suppository (10 mg total) rectally daily as needed for moderate constipation or severe constipation. 12 suppository 0  . diclofenac Sodium (VOLTAREN) 1 % GEL Apply 2 g topically 4 (four) times daily. At the site of pain    . escitalopram (LEXAPRO) 20 MG tablet TAKE 1 TABLET BY MOUTH EVERY DAY (Patient taking differently: Take 20 mg by mouth daily.) 90 tablet 3  . feeding supplement (ENSURE ENLIVE / ENSURE PLUS) LIQD Take 237 mLs by mouth 3 (three) times daily between meals.    .Marland Kitchenlevothyroxine (SYNTHROID) 150 MCG tablet Take 1 tablet (150 mcg total) by mouth every morning. 30 minutes before food    . Multiple Vitamin (MULTIVITAMIN WITH MINERALS) TABS tablet Take 1 tablet by mouth daily.    . ondansetron (ZOFRAN) 4 MG tablet Take 1 tablet (4 mg total) by mouth every 8 (eight) hours as needed for nausea or vomiting.  0  . polyethylene glycol (MIRALAX / GLYCOLAX) 17 g packet Take 17 g by mouth daily. 14 each 0  . promethazine (PHENERGAN) 12.5 MG tablet Take 1 tablet (12.5 mg total) by mouth every 8 (eight) hours as needed for nausea or vomiting. 30 tablet 2  . tamoxifen (NOLVADEX) 20 MG tablet Take 1 tablet (20 mg total) by mouth daily. 90 tablet 3  . traZODone (DESYREL) 50 MG tablet Take 1 tablet (50 mg total) by mouth at bedtime.    . vitamin B-12 (CYANOCOBALAMIN) 1000 MCG tablet Take 1 tablet (1,000 mcg total) by mouth daily.     No current facility-administered medications for this visit.    PHYSICAL EXAMINATION: ECOG  PERFORMANCE STATUS: {CHL ONC ECOG PS:[phone]}  There were no vitals filed for this visit. There were no vitals filed for this visit.  LABORATORY DATA:  I have reviewed the data as listed CMP Latest Ref Rng & Units 07/28/2020 07/27/2020 07/24/2020  Glucose 70 - 99 mg/dL 101(H) 114(H) 120(H)  BUN 8 - 23 mg/dL 9 <5(L) 8  Creatinine 0.44 - 1.00 mg/dL 0.64 0.62 0.64  Sodium 135 - 145 mmol/L  140 143 138  Potassium 3.5 - 5.1 mmol/L 4.3 2.7(LL) 3.7  Chloride 98 - 111 mmol/L 102 103 106  CO2 22 - 32 mmol/L 32 32 26  Calcium 8.9 - 10.3 mg/dL 8.6(L) 8.0(L) 7.9(L)  Total Protein 6.5 - 8.1 g/dL - - -  Total Bilirubin 0.3 - 1.2 mg/dL - - -  Alkaline Phos 38 - 126 U/L - - -  AST 15 - 41 U/L - - -  ALT 0 - 44 U/L - - -    Lab Results  Component Value Date   WBC 4.9 07/27/2020   HGB 9.9 (L) 07/27/2020   HCT 31.1 (L) 07/27/2020   MCV 104.4 (H) 07/27/2020   PLT 338 07/27/2020   NEUTROABS 6.0 07/19/2020    ASSESSMENT & PLAN:  No problem-specific Assessment & Plan notes found for this encounter.    No orders of the defined types were placed in this encounter.  The patient has a good understanding of the overall plan. she agrees with it. she will call with any problems that may develop before the next visit here.  Total time spent: *** mins including face to face time and time spent for planning, charting and coordination of care  Rulon Eisenmenger, MD, MPH 08/12/2020  I, Molly Dorshimer, am acting as scribe for Dr. Nicholas Lose.  {insert scribe attestation}

## 2020-08-13 ENCOUNTER — Inpatient Hospital Stay: Payer: Medicare Other | Attending: Hematology and Oncology | Admitting: Hematology and Oncology

## 2020-08-13 DIAGNOSIS — Z170 Estrogen receptor positive status [ER+]: Secondary | ICD-10-CM

## 2020-08-14 ENCOUNTER — Other Ambulatory Visit: Payer: Self-pay

## 2020-08-14 ENCOUNTER — Non-Acute Institutional Stay: Payer: Medicare Other | Admitting: Nurse Practitioner

## 2020-08-14 DIAGNOSIS — R630 Anorexia: Secondary | ICD-10-CM | POA: Diagnosis not present

## 2020-08-14 DIAGNOSIS — Z515 Encounter for palliative care: Secondary | ICD-10-CM | POA: Diagnosis not present

## 2020-08-14 DIAGNOSIS — R531 Weakness: Secondary | ICD-10-CM

## 2020-08-14 NOTE — Progress Notes (Signed)
[facility] Palliative Care Consult Note Telephone: [phone]  Fax: [phone]    Date of encounter: 08/14/20 PATIENT NAME: [address] [place] [id number]   [phone] (home)  DOB: Aug 24, 1945 MRN: [id number]  PRIMARY CARE PROVIDER:    Chrystie Nose,  [place] [id number] [phone]  REFERRING PROVIDER:   Dr. Erich Montane  RESPONSIBLE PARTY:    Contact Information    Name Relation Home Work Mobile   [place] Daughter   [phone]   [name] Sister   [phone]   Shelbie Proctor [phone]       I met face to face with patient in facility. Her sister and her sister's friend present at visit. Palliative Care was asked to follow this patient by consultation request of  Dr. Rueben Bash to address advance care planning and complex medical decision making. This is the initial visit.                                   ASSESSMENT AND PLAN / RECOMMENDATIONS:   Advance Care Planning/Goals of Care: Goals include to maximize quality of life and symptom management. Visit consisted of building trust and discussions on Palliative care medicine as specialized medical care for people living with serious illness, aimed at facilitating improved quality of life through symptoms relief, assisting with advance care planning and establishing goals of care. Family expressed appreciation for education provided on Palliative care and how it differs from Hospice service. Our advance care planning conversation today included a discussion about:     The value and importance of advance care planning   Experiences with loved ones who have been seriously ill or have died   Exploration of personal, cultural or spiritual beliefs that might influence medical decisions   Exploration of goals of care in the event of a sudden injury or illness   Review and updating or creation of an  advance directive  document .  CODE STATUS: DNR Goal of care: Patient's goal of care is comfort while preserving function. Directives: Signed DNR and MOST form present on file in the facility, copy uploaded to [facility] EMR. Details of MOST form include limited additional interventions, antibiotics if indicated, IV fluid if indicated, no feeding tube. Patient reiterated desire to not be resuscitated in the event of cardiac or respiratory arrest. Provided counseling and general support. Questions and concerns were addressed. Patient was encouraged to call with questions and/or concerns.  I spent 20 minutes providing this consultation. More than 50% of the time in this consultation was spent in counseling and care coordination. ----------------------------------------------------------------------  Symptom Management/Plan: Poor oral intake: BMI 21.6, encouraged snacking between meals. Staff report patient refuses Medpass nutritional supplement, may offer Magic cup nutritional supplement per patient's preference if no meal intake. Facility encouraged to discussed meal options with patient. Consider RD consult if weight loss persist. Generalized weakness: Continue PT/OT Alzheimer dementia: FAST score 6c, no report of uncontrolled behaviorial concerns. Continue supportive care. Depression: On Escitalopram 20 MG daily. Denied suicide or homicidal ideation. Endorsed having good family support. Has two sisters in town, her daughter is in [place] and son lives in [place], they call regularly. Her plan is to return home after rehab stay. She lives a lone, discussed need to obtain a life alert device for emergencies. Patient verbalized understanding and agreement with the suggestion.  Follow up Palliative Care Visit: Palliative care will continue to follow for complex medical decision making, advance care planning, and clarification of goals. Return in about 6-8 weeks or prn.  PPS: 40%  HOSPICE ELIGIBILITY/DIAGNOSIS:  TBD  Chief Complaint: poor oral intake   History obtained from review of Epic EMR and discussion with [name].  HISTORY OF PRESENT ILLNESS:  JUNA CABAN is a 75 y.o. year old female with Alzheimer's disease (FAST 6c), breast CA on tamoxifen s/p left masectomy, COPD, depression, hypothyroidism, TSH, HTN. Patient s/p hospitalization from 07/19/2020 to 07/29/2020 for generalized weakness. She was discharged to [facility] and rehab facility for rehabilitation services and nursing care. Patient with poor progression with therapy due to weakness related to poor oral intake. Patient report poor oral intake is related to her not liking the facility food. She denied nausea, denied vomiting, denied chewing or swallowing concerns.    I reviewed available labs, medications, imaging, studies and related documents from the EMR.  Records reviewed and summarized above.   ROS EYES: denies acute vision changes ENMT: denies dysphagia Cardiovascular: denies chest pain, denies DOE Pulmonary: denies cough, denies increased SOB Abdomen: endorses poor appetite, denies constipation, endorses continence of bowel GU: denies dysuria, endorses continence of urine MSK:  endorsed weakness, no falls reported Skin: denies rashes Neurological: denies uncontrolled pain, denies insomnia Psych: Endorses positive mood Heme/lymph/immuno: denies bruises, abnormal bleeding  Physical Exam: Current and past weights: 106.8lbs, Ht 35f11", BMI 21.6kg/m2 General: frail appearing, thin, lying in bed in NAD  EYES: anicteric sclera, no discharge  ENMT: intact hearing, oral mucous membranes moist CV: RRR, no LE edema Pulmonary: LCTA, no increased work of breathing, no cough, 97% on supplemental oxygen at 2.5L Abdomen: soft and non tender, no ascites GU: deferred MSK: sarcopenia, moves all extremities, ambulatory Skin: warm and dry, no rashes Neuro: generalized weakness, no cognitive impairment Psych: non-anxious affect, A  and O x 3 Hem/lymph/immuno: no widespread bruising  CURRENT PROBLEM LIST:  Patient Active Problem List   Diagnosis Date Noted  . Generalized weakness 07/20/2020  . Hypokalemia 07/20/2020  . General weakness 07/20/2020  . Failure to thrive in adult   . History of CVA (cerebrovascular accident) 07/12/2020  . History of breast cancer 07/12/2020  . CKD (chronic kidney disease) stage 2, GFR 60-89 ml/min 07/12/2020  . Dehydration 07/12/2020  . Dizzy 07/11/2020  . Skin lesion of right leg 04/24/2020  . Inclusion cyst 04/24/2020  . Skin lesion of right arm 08/16/2019  . Sleep disturbance 06/13/2018  . Health education 06/13/2018  . Chronic left hip pain 12/29/2017  . Late onset Alzheimer's disease with behavioral disturbance ([facility] 12/29/2017  . Cancer of left breast, stage 1, estrogen receptor positive ([facility] 05/13/2017  . Malignant neoplasm of overlapping sites of left breast in female, estrogen receptor positive ([facility] 03/08/2017  . Lesion of nose 03/02/2017  . Callus of foot 11/20/2016  . Loss of appetite 12/29/2015  . Fatigue 12/29/2015  . Bipolar affective disorder, currently depressed, moderate ([facility] 11/26/2015  . Depression   . Severe episode of recurrent major depressive disorder, without psychotic features ([facility] [place]   . Foreign body in foot, right 01/09/2015  . Blister of right ankle without infection 01/09/2015  . Sinusitis 11/26/2014  . Rotator cuff syndrome of right shoulder 11/14/2014  . Varicose veins of both lower extremities with pain 10/29/2014  . Neck pain 09/26/2014  . Metatarsal fracture 08/16/2014  . Pain in joint, shoulder region 08/16/2014  . Facial skin  lesion 08/01/2014  . Leg edema, left 07/25/2014  . Edema of left lower extremity 07/22/2014  . Essential hypertension   . Thyroid activity decreased   . HLD (hyperlipidemia)   . Healthcare maintenance 05/31/2014  . Emotional neurotic disorder 05/07/2014  . Seborrheic keratosis 12/05/2013  . Angiomyolipoma of  right kidney 06/08/2013  . Mass of left side of neck 10/17/2012  . At high risk for falls 06/28/2012  . Allergic rhinitis 06/16/2012  . Mass of left breast 10/27/2011  . Hearing loss 01/07/2011  . Foot pain 11/25/2010  . Thyroid nodule 10/16/2010  . Nausea 09/22/2010  . URINARY INCONTINENCE, STRESS, FEMALE 04/03/2010  . HYPERLIPIDEMIA 03/25/2010  . PULMONARY NODULE 12/23/2009  . COPD (chronic obstructive pulmonary disease) ([place]) 12/03/2009  . Tobacco abuse counseling 11/05/2009  . CVA 11/05/2009  . BIPOLAR DISORDER UNSPECIFIED 11/28/2008  . Hypothyroidism 05/06/2008  . OCD (obsessive compulsive disorder) 10/13/2006  . GERD 10/13/2006  . OSTEOPOROSIS 10/13/2006   PAST MEDICAL HISTORY:  Active Ambulatory Problems    Diagnosis Date Noted  . Hypothyroidism 05/06/2008  . BIPOLAR DISORDER UNSPECIFIED 11/28/2008  . OCD (obsessive compulsive disorder) 10/13/2006  . Tobacco abuse counseling 11/05/2009  . CVA 11/05/2009  . PULMONARY NODULE 12/23/2009  . GERD 10/13/2006  . OSTEOPOROSIS 10/13/2006  . HYPERLIPIDEMIA 03/25/2010  . COPD (chronic obstructive pulmonary disease) ([place]) 12/03/2009  . URINARY INCONTINENCE, STRESS, FEMALE 04/03/2010  . Nausea 09/22/2010  . Thyroid nodule 10/16/2010  . Foot pain 11/25/2010  . Hearing loss 01/07/2011  . Mass of left breast 10/27/2011  . Allergic rhinitis 06/16/2012  . At high risk for falls 06/28/2012  . Mass of left side of neck 10/17/2012  . 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 Seborrheic keratosis 12/05/2013  . Emotional neurotic disorder 05/07/2014  . Healthcare maintenance 05/31/2014  . Essential hypertension   . Thyroid activity decreased   . HLD (hyperlipidemia)   . Edema of left lower extremity 07/22/2014  . Leg edema, left 07/25/2014  . Facial skin lesion 08/01/2014  . Metatarsal fracture 08/16/2014  . Pain in joint, shoulder region 08/16/2014  . Neck pain 09/26/2014  . Varicose veins of both lower extremities with  pain 10/29/2014  . Rotator cuff syndrome of right shoulder 11/14/2014  . Sinusitis 11/26/2014  . Foreign body in foot, right 01/09/2015  . Blister of right ankle without infection 01/09/2015  . Severe episode of recurrent major depressive disorder, without psychotic features ([place])   . Depression   . Bipolar affective disorder, currently depressed, moderate ([place]) 11/26/2015  . Loss of appetite 12/29/2015  . Fatigue 12/29/2015  . Callus of foot 11/20/2016  . Lesion of nose 03/02/2017  . Malignant neoplasm of overlapping sites of left breast in female, estrogen receptor positive ([place]) 03/08/2017  . Cancer of left breast, stage 1, estrogen receptor positive ([place]) 05/13/2017  . Chronic left hip pain 12/29/2017  . Late onset Alzheimer's disease with behavioral disturbance ([place]) 12/29/2017  . Sleep disturbance 06/13/2018  . Health education 06/13/2018  . Skin lesion of right arm 08/16/2019  . Skin lesion of right leg 04/24/2020  . Inclusion cyst 04/24/2020  . Dizzy 07/11/2020  . History of CVA (cerebrovascular accident) 07/12/2020  . History of breast cancer 07/12/2020  . CKD (chronic kidney disease) stage 2, GFR 60-89 ml/min 07/12/2020  . Dehydration 07/12/2020  . Generalized weakness 07/20/2020  . Hypokalemia 07/20/2020  . Failure to thrive in adult   . General weakness 07/20/2020   Resolved Ambulatory Problems  Diagnosis Date Noted  . Unspecified episodic mood disorder 10/13/2006  . OBSESSIVE-COMPULSIVE DISORDER 10/13/2006  . DIVERTICULOSIS, COLON 10/13/2006  . CHEST PAIN 08/02/2009  . HYPERTENSION 03/25/2010  . LEG PAIN, LEFT 10/30/2009  . VIRAL URI 05/05/2010  . Pre-op exam 06/11/2010  . Viral URI 07/11/2010  . UTI (urinary tract infection), uncomplicated 74/25/9563  . Bilateral leg edema 09/23/2010  . Difficulty hearing 10/16/2010  . Ear pain 11/05/2010  . Easy bruising 11/15/2010  . Arm pain, right 11/15/2010  . Varicose veins 11/15/2010  . Gastroenteritis  12/02/2010  . Viral URI 02/21/2011  . Worried well 05/01/2011  . Right foot pain 07/07/2011  . Flank pain 07/30/2011  . Right ear pain 06/01/2012  . Abrasion of left lower leg 06/16/2012  . Back pain 06/16/2012  . Congestion of nasal sinus 06/22/2012  . Muscle spasm of back 06/22/2012  . Acute sinusitis 06/26/2012  . Lower leg pain 07/13/2012  . Rib pain 09/11/2012  . Dysuria 09/26/2012  . Eye pain 10/30/2012  . Health care maintenance 10/30/2012  . Eye irritation 11/27/2012  . Diarrhea 01/11/2013  . RUQ pain 05/17/2013  . Neck pain 06/21/2013  . Shoulder pain, left 06/21/2013  . Nausea alone 07/19/2013  . Insomnia 08/20/2013  . Plantar wart of both feet 12/05/2013  . Plantar wart of right foot 12/21/2013  . Depression, major, severe recurrence ([place]) 07/22/2013  . Axillary lump 05/31/2014  . Potential exposure to STD 05/31/2014  . Routine screening for STI (sexually transmitted infection) 05/31/2014  . Acute exacerbation of chronic obstructive pulmonary disease (COPD) ([place]) 06/13/2014  . COPD exacerbation ([place])   . Chest pain 06/26/2014  . UTI (urinary tract infection) 08/16/2014  . Prolonged QT interval 07/12/2020   Past Medical History:  Diagnosis Date  . Anxiety   . Arthritis   . Cancer ([place])   . Diverticulosis of colon   . GERD (gastroesophageal reflux disease)   . Heart murmur   . History of uterine prolapse 10/2004  . Hypertension   . Osteoporosis   . PUD (peptic ulcer disease) 09/2006  . Thyroid disease    SOCIAL HX:  Social History   Tobacco Use  . Smoking status: Former Smoker    Packs/day: 0.75    Years: 40.00    Pack years: 30.00    Types: Cigarettes    Quit date: 11/2016    Years since quitting: 3.7  . Smokeless tobacco: Never Used  Substance Use Topics  . Alcohol use: No    Alcohol/week: 0.0 standard drinks   FAMILY HX:  Family History  Problem Relation Age of Onset  . Heart disease Mother   . Stomach cancer Mother   . Cirrhosis Sister    . Hypertension Sister   . Heart disease Maternal Grandmother   . Heart disease Maternal Grandfather   . Colon cancer Maternal Aunt   . Other Neg Hx      ALLERGIES:  Allergies  Allergen Reactions  . Seroquel [Quetiapine Fumarate] Other (See Comments)    Reaction:  Drowsiness   . Amoxicillin Nausea Only and Other (See Comments)    Has patient had a PCN reaction causing immediate rash, facial/tongue/throat swelling, SOB or lightheadedness with hypotension: No Has patient had a PCN reaction causing severe rash involving mucus membranes or skin necrosis: No Has patient had a PCN reaction that required hospitalization No Has patient had a PCN reaction occurring within the last 10 years: No If all of the above answers are "NO", then  may proceed with Cephalosporin use.  . Ciprofloxacin Other (See Comments)    Reaction:  Unknown   . Haloperidol Lactate Other (See Comments)    Reaction:  Arm stiffness   . Ketorolac Tromethamine Other (See Comments)    Reaction:  Unknown   . Latuda [Lurasidone Hcl] Other (See Comments)    Pt states that this medication makes her feel "weird".    . Thorazine [Chlorpromazine] Other (See Comments)    Reaction:  Unknown   . Wellbutrin [Bupropion] Other (See Comments)    Pt states that this medication makes her feel "weird".       PERTINENT MEDICATIONS:  Outpatient Encounter Medications as of 08/14/2020  Medication Sig  . amLODipine (NORVASC) 5 MG tablet Take 1 tablet (5 mg total) by mouth daily.  [name] aspirin 325 MG tablet Take 325 mg by mouth daily.  . bisacodyl (DULCOLAX) 10 MG suppository Place 1 suppository (10 mg total) rectally daily as needed for moderate constipation or severe constipation.  . diclofenac Sodium (VOLTAREN) 1 % GEL Apply 2 g topically 4 (four) times daily. At the site of pain  . escitalopram (LEXAPRO) 20 MG tablet TAKE 1 TABLET BY MOUTH EVERY DAY (Patient taking differently: Take 20 mg by mouth daily.)  . feeding supplement (ENSURE ENLIVE  / ENSURE PLUS) LIQD Take 237 mLs by mouth 3 (three) times daily between meals.  [name] levothyroxine (SYNTHROID) 150 MCG tablet Take 1 tablet (150 mcg total) by mouth every morning. 30 minutes before food  . Multiple Vitamin (MULTIVITAMIN WITH MINERALS) TABS tablet Take 1 tablet by mouth daily.  . ondansetron (ZOFRAN) 4 MG tablet Take 1 tablet (4 mg total) by mouth every 8 (eight) hours as needed for nausea or vomiting.  . polyethylene glycol (MIRALAX / GLYCOLAX) 17 g packet Take 17 g by mouth daily.  . promethazine (PHENERGAN) 12.5 MG tablet Take 1 tablet (12.5 mg total) by mouth every 8 (eight) hours as needed for nausea or vomiting.  . tamoxifen (NOLVADEX) 20 MG tablet Take 1 tablet (20 mg total) by mouth daily.  . traZODone (DESYREL) 50 MG tablet Take 1 tablet (50 mg total) by mouth at bedtime.  . vitamin B-12 (CYANOCOBALAMIN) 1000 MCG tablet Take 1 tablet (1,000 mcg total) by mouth daily.   No facility-administered encounter medications on file as of 08/14/2020.   Thank you for the opportunity to participate in the care of [name].  The palliative care team will continue to follow. Please call our office at [phone] if we can be of additional assistance.   Jari Favre, DNP, AGPCNP-BC  COVID-19 PATIENT SCREENING TOOL Asked and negative response unless otherwise noted:   Have you had symptoms of covid, tested positive or been in contact with someone with symptoms/positive test in the past 5-10 days?

## 2020-08-19 DIAGNOSIS — R627 Adult failure to thrive: Secondary | ICD-10-CM | POA: Diagnosis not present

## 2020-08-19 DIAGNOSIS — M6281 Muscle weakness (generalized): Secondary | ICD-10-CM | POA: Diagnosis not present

## 2020-08-19 DIAGNOSIS — J449 Chronic obstructive pulmonary disease, unspecified: Secondary | ICD-10-CM | POA: Diagnosis not present

## 2020-08-19 DIAGNOSIS — G301 Alzheimer's disease with late onset: Secondary | ICD-10-CM | POA: Diagnosis not present

## 2020-08-19 DIAGNOSIS — I10 Essential (primary) hypertension: Secondary | ICD-10-CM | POA: Diagnosis not present

## 2020-08-25 DIAGNOSIS — J449 Chronic obstructive pulmonary disease, unspecified: Secondary | ICD-10-CM | POA: Diagnosis not present

## 2020-08-25 DIAGNOSIS — F32A Depression, unspecified: Secondary | ICD-10-CM | POA: Diagnosis not present

## 2020-08-25 DIAGNOSIS — M6281 Muscle weakness (generalized): Secondary | ICD-10-CM | POA: Diagnosis not present

## 2020-08-25 DIAGNOSIS — G301 Alzheimer's disease with late onset: Secondary | ICD-10-CM | POA: Diagnosis not present

## 2020-08-25 DIAGNOSIS — R109 Unspecified abdominal pain: Secondary | ICD-10-CM | POA: Diagnosis not present

## 2020-08-25 DIAGNOSIS — K5900 Constipation, unspecified: Secondary | ICD-10-CM | POA: Diagnosis not present

## 2020-08-25 DIAGNOSIS — I10 Essential (primary) hypertension: Secondary | ICD-10-CM | POA: Diagnosis not present

## 2020-08-28 ENCOUNTER — Telehealth: Payer: Self-pay

## 2020-08-28 NOTE — Telephone Encounter (Signed)
Pt's daughter called and LVM asking about appt pt has scheduled with Dr Lindi Adie 5/27. Pt's daughter states pt is currently in SNF for rehab and states she does not know how her mother will get here. Merleen Nicely RN states she spoke with someone at [employer] and they stated if transportation was not available they would call us back to cancel. This LPN attempted to return call to pt's daughter; no answer LVM.

## 2020-08-29 ENCOUNTER — Inpatient Hospital Stay: Payer: Medicare Other | Admitting: Hematology and Oncology

## 2020-08-29 NOTE — Assessment & Plan Note (Deleted)
05/13/2017:Left simple mastectomy: IDC grade 2, 7.7 cm, DCIS intermediate grade, lymphovascular invasion identified, perineural invasion present, margins negative, 1/2 lymph nodes positive, ER 6200% strong, PR 0%, HER-2 negative, Ki-67 5%, T3N1 a stage IIIa Has major depression: On Lexapro  Sleeps all day and wakes up all night. Severe fatigue limiting all activities Did not respond to multiple calls Profound weight loss.  Taking Tamoxifen: No Adverse effects Has no friends or family to help her. She needs a mammogram on right breast but doesn't have anyone to take her. Her daughter lives in [place].  Hospitalization 07/19/2020-07/29/2020: Worsening weakness and fatigue, hypertensive urgency.  (Initial admission 07/11/2020 discharge 07/16/2020: Short-term rehab: Concerns for ill treatment at the facility: Patient was apparently found lying on the floor for 2 days). 07/11/2020: CT angiogram of the head: Diffuse sclerotic change within the axial skeleton concerning for sclerotic metastatic disease 07/11/2020: Brain MRI: No acute intracranial abnormality.  No bone lesions.  Recommendation: Obtain bone scan for further assessment. Unfortunately patient has very poor support systems and usually does not have any ability to even get mammograms.

## 2020-09-16 DIAGNOSIS — G4701 Insomnia due to medical condition: Secondary | ICD-10-CM | POA: Diagnosis not present

## 2020-09-16 DIAGNOSIS — F319 Bipolar disorder, unspecified: Secondary | ICD-10-CM | POA: Diagnosis not present

## 2020-09-16 DIAGNOSIS — F0150 Vascular dementia without behavioral disturbance: Secondary | ICD-10-CM | POA: Diagnosis not present

## 2020-09-18 ENCOUNTER — Emergency Department (HOSPITAL_COMMUNITY): Payer: Medicare Other

## 2020-09-18 ENCOUNTER — Emergency Department (HOSPITAL_COMMUNITY)
Admission: EM | Admit: 2020-09-18 | Discharge: 2020-09-18 | Disposition: A | Discharge status: Home / Self Care | Payer: Medicare Other | Attending: Emergency Medicine | Admitting: Emergency Medicine

## 2020-09-18 ENCOUNTER — Encounter (HOSPITAL_COMMUNITY): Payer: Self-pay

## 2020-09-18 DIAGNOSIS — Z7982 Long term (current) use of aspirin: Secondary | ICD-10-CM | POA: Insufficient documentation

## 2020-09-18 DIAGNOSIS — Z743 Need for continuous supervision: Secondary | ICD-10-CM | POA: Diagnosis not present

## 2020-09-18 DIAGNOSIS — R9389 Abnormal findings on diagnostic imaging of other specified body structures: Secondary | ICD-10-CM

## 2020-09-18 DIAGNOSIS — R1032 Left lower quadrant pain: Secondary | ICD-10-CM | POA: Diagnosis present

## 2020-09-18 DIAGNOSIS — N390 Urinary tract infection, site not specified: Secondary | ICD-10-CM | POA: Diagnosis not present

## 2020-09-18 DIAGNOSIS — N281 Cyst of kidney, acquired: Secondary | ICD-10-CM | POA: Diagnosis not present

## 2020-09-18 DIAGNOSIS — Z87891 Personal history of nicotine dependence: Secondary | ICD-10-CM | POA: Diagnosis not present

## 2020-09-18 DIAGNOSIS — D3500 Benign neoplasm of unspecified adrenal gland: Secondary | ICD-10-CM | POA: Diagnosis not present

## 2020-09-18 DIAGNOSIS — E039 Hypothyroidism, unspecified: Secondary | ICD-10-CM | POA: Insufficient documentation

## 2020-09-18 DIAGNOSIS — Z853 Personal history of malignant neoplasm of breast: Secondary | ICD-10-CM | POA: Insufficient documentation

## 2020-09-18 DIAGNOSIS — N182 Chronic kidney disease, stage 2 (mild): Secondary | ICD-10-CM | POA: Diagnosis not present

## 2020-09-18 DIAGNOSIS — B9689 Other specified bacterial agents as the cause of diseases classified elsewhere: Secondary | ICD-10-CM | POA: Diagnosis not present

## 2020-09-18 DIAGNOSIS — R404 Transient alteration of awareness: Secondary | ICD-10-CM | POA: Diagnosis not present

## 2020-09-18 DIAGNOSIS — N3001 Acute cystitis with hematuria: Secondary | ICD-10-CM

## 2020-09-18 DIAGNOSIS — Z79899 Other long term (current) drug therapy: Secondary | ICD-10-CM | POA: Diagnosis not present

## 2020-09-18 DIAGNOSIS — Z85528 Personal history of other malignant neoplasm of kidney: Secondary | ICD-10-CM | POA: Diagnosis not present

## 2020-09-18 DIAGNOSIS — J449 Chronic obstructive pulmonary disease, unspecified: Secondary | ICD-10-CM | POA: Insufficient documentation

## 2020-09-18 DIAGNOSIS — K529 Noninfective gastroenteritis and colitis, unspecified: Secondary | ICD-10-CM | POA: Insufficient documentation

## 2020-09-18 DIAGNOSIS — N3289 Other specified disorders of bladder: Secondary | ICD-10-CM | POA: Diagnosis not present

## 2020-09-18 DIAGNOSIS — I129 Hypertensive chronic kidney disease with stage 1 through stage 4 chronic kidney disease, or unspecified chronic kidney disease: Secondary | ICD-10-CM | POA: Diagnosis not present

## 2020-09-18 DIAGNOSIS — R1084 Generalized abdominal pain: Secondary | ICD-10-CM | POA: Diagnosis not present

## 2020-09-18 DIAGNOSIS — E876 Hypokalemia: Secondary | ICD-10-CM

## 2020-09-18 DIAGNOSIS — R4182 Altered mental status, unspecified: Secondary | ICD-10-CM | POA: Diagnosis not present

## 2020-09-18 DIAGNOSIS — K3189 Other diseases of stomach and duodenum: Secondary | ICD-10-CM | POA: Diagnosis not present

## 2020-09-18 LAB — COMPREHENSIVE METABOLIC PANEL
ALT: 9 U/L (ref 0–44)
AST: 12 U/L — ABNORMAL LOW (ref 15–41)
Albumin: 2.4 g/dL — ABNORMAL LOW (ref 3.5–5.0)
Alkaline Phosphatase: 153 U/L — ABNORMAL HIGH (ref 38–126)
Anion gap: 11 (ref 5–15)
BUN: 15 mg/dL (ref 8–23)
CO2: 25 mmol/L (ref 22–32)
Calcium: 8.3 mg/dL — ABNORMAL LOW (ref 8.9–10.3)
Chloride: 101 mmol/L (ref 98–111)
Creatinine, Ser: 0.57 mg/dL (ref 0.44–1.00)
GFR, Estimated: >60 mL/min (ref >60)
Glucose, Bld: 109 mg/dL — ABNORMAL HIGH (ref 70–99)
Potassium: 2.6 mmol/L — CL (ref 3.5–5.1)
Sodium: 137 mmol/L (ref 135–145)
Total Bilirubin: 0.8 mg/dL (ref 0.3–1.2)
Total Protein: 5.3 g/dL — ABNORMAL LOW (ref 6.5–8.1)

## 2020-09-18 LAB — URINALYSIS, MICROSCOPIC (REFLEX)

## 2020-09-18 LAB — CBC
HCT: 39.7 % (ref 36.0–46.0)
Hemoglobin: 13.1 g/dL (ref 12.0–15.0)
MCH: 31.7 pg (ref 26.0–34.0)
MCHC: 33 g/dL (ref 30.0–36.0)
MCV: 96.1 fL (ref 80.0–100.0)
Platelets: 302 10*3/uL (ref 150–400)
RBC: 4.13 MIL/uL (ref 3.87–5.11)
RDW: 13.5 % (ref 11.5–15.5)
WBC: 7.9 10*3/uL (ref 4.0–10.5)
nRBC: 0 % (ref 0.0–0.2)

## 2020-09-18 LAB — URINALYSIS, ROUTINE W REFLEX MICROSCOPIC
Bilirubin Urine: NEGATIVE
Glucose, UA: NEGATIVE mg/dL
Ketones, ur: 15 mg/dL — AB
Nitrite: POSITIVE — AB
Protein, ur: 30 mg/dL — AB
Specific Gravity, Urine: 1.015 (ref 1.005–1.030)
pH: 7.5 (ref 5.0–8.0)

## 2020-09-18 LAB — LIPASE, BLOOD: Lipase: 26 U/L (ref 11–51)

## 2020-09-18 MED ORDER — ONDANSETRON HCL 4 MG/2ML IJ SOLN
4.0000 mg | Freq: Once | INTRAMUSCULAR | Status: AC
  Administered 2020-09-18: 4 mg via INTRAVENOUS
  Filled 2020-09-18: qty 2

## 2020-09-18 MED ORDER — POTASSIUM CHLORIDE CRYS ER 20 MEQ PO TBCR: 20.0000 meq | EXTENDED_RELEASE_TABLET | Freq: Two times a day (BID) | ORAL | 0 refills | Status: DC

## 2020-09-18 MED ORDER — METRONIDAZOLE 500 MG PO TABS
500.0000 mg | ORAL_TABLET | Freq: Once | ORAL | Status: AC
  Administered 2020-09-18: 500 mg via ORAL
  Filled 2020-09-18: qty 1

## 2020-09-18 MED ORDER — CEFDINIR 300 MG PO CAPS: 300.0000 mg | ORAL_CAPSULE | Freq: Two times a day (BID) | ORAL | 0 refills | Status: DC

## 2020-09-18 MED ORDER — CEFDINIR 300 MG PO CAPS
300.0000 mg | ORAL_CAPSULE | Freq: Once | ORAL | Status: AC
  Administered 2020-09-18: 300 mg via ORAL
  Filled 2020-09-18: qty 1

## 2020-09-18 MED ORDER — METRONIDAZOLE 500 MG PO TABS: 500.0000 mg | ORAL_TABLET | Freq: Three times a day (TID) | ORAL | 0 refills | Status: DC

## 2020-09-18 MED ORDER — POTASSIUM CHLORIDE 10 MEQ/100ML IV SOLN
10.0000 meq | INTRAVENOUS | Status: AC
  Administered 2020-09-18 (×3): 10 meq via INTRAVENOUS
  Filled 2020-09-18 (×3): qty 100

## 2020-09-18 MED ORDER — SODIUM CHLORIDE 0.9 % IV BOLUS
500.0000 mL | Freq: Once | INTRAVENOUS | Status: AC
  Administered 2020-09-18: 500 mL via INTRAVENOUS

## 2020-09-18 NOTE — ED Notes (Signed)
Patient found to be incontinent of stool and urine. Diarrhea noted, moisture barrier cream applied. New brief and pt's personal gown applied, IV removed.

## 2020-09-18 NOTE — Discharge Instructions (Addendum)
There is evidence of a mild colitis on CT imaging. Take Cefdinir and Flagyl (antibiotics) for the next 7 days which should improve your abdominal pain. It will also cover the infection in your urine.   Your potassium was very low tonight (2.6). You were given potassium in the emergency department. You will need to take oral potassium for the next 3 days as well.   Your CT scan showed diffuse bony abnormalities. This is a concerning finding in the setting of your history of breast cancer and will need further investigation in the outpatient setting.

## 2020-09-18 NOTE — ED Provider Notes (Signed)
[facility] DEPT Provider Note   CSN: [id number] Arrival date & time: 09/18/20  0243     History Chief Complaint  Patient presents with   Abdominal Pain    [name] is a 75 y.o. female.  Patient with a history of anxiety, depression, breast cancer, COPD, HTN, CKD, hypothyroid, OCD, PUD presents with LLQ abdominal pain for the past month. She reports that she has to strain for a bowel movement but then her pain eases off. She has chronic nausea and reports vomiting x 1 with current pain. She has no urinary symptoms, chest pain, SOB or fever. Here from [facility] who reports via EMS that she has refused all medications for the past 3-4 days. The patient states she does not intend to take any medications any more.    Abdominal Pain Associated symptoms: nausea and vomiting   Associated symptoms: no chills and no fever       Past Medical History:  Diagnosis Date   Anxiety    Arthritis    Cancer ([place])    breast cancer   COPD (chronic obstructive pulmonary disease) (HCC)    Due to long history of tobacco abuse, still smoking   Depression    Diverticulosis of colon    GERD (gastroesophageal reflux disease)    Heart murmur    not sure never been evaluated   History of uterine prolapse 10/2004   Dr Cletis Media   Hypertension    not on any medications   Hypothyroidism    OCD (obsessive compulsive disorder)    Osteoporosis    PUD (peptic ulcer disease) 09/2006   H pylori   Thyroid disease    Ho R thyroid noduel plus mild hyperthyroidism. Treated with radioiodine therapty on 04/2006. Dr Estrella Myrtle.    Patient Active Problem List   Diagnosis Date Noted   Generalized weakness 07/20/2020   Hypokalemia 07/20/2020   General weakness 07/20/2020   Failure to thrive in adult    History of CVA (cerebrovascular accident) 07/12/2020   History of breast cancer 07/12/2020   CKD (chronic kidney disease) stage 2, GFR 60-89 ml/min 07/12/2020    Dehydration 07/12/2020   Dizzy 07/11/2020   Skin lesion of right leg 04/24/2020   Inclusion cyst 04/24/2020   Skin lesion of right arm 08/16/2019   Sleep disturbance 06/13/2018   Health education 06/13/2018   Chronic left hip pain 12/29/2017   Late onset Alzheimer's disease with behavioral disturbance ([place]) 12/29/2017   Cancer of left breast, stage 1, estrogen receptor positive ([place]) 05/13/2017   Malignant neoplasm of overlapping sites of left breast in female, estrogen receptor positive ([place]) 03/08/2017   Lesion of nose 03/02/2017   Callus of foot 11/20/2016   Loss of appetite 12/29/2015   Fatigue 12/29/2015   Bipolar affective disorder, currently depressed, moderate ([place]) 11/26/2015   Depression    Severe episode of recurrent major depressive disorder, without psychotic features ([place])    Foreign body in foot, right 01/09/2015   Blister of right ankle without infection 01/09/2015   Sinusitis 11/26/2014   Rotator cuff syndrome of right shoulder 11/14/2014   Varicose veins of both lower extremities with pain 10/29/2014   Neck pain 09/26/2014   Metatarsal fracture 08/16/2014   Pain in joint, shoulder region 08/16/2014   Facial skin lesion 08/01/2014   Leg edema, left 07/25/2014   Edema of left lower extremity 07/22/2014   Essential hypertension    Thyroid activity decreased    HLD (hyperlipidemia)  Healthcare maintenance 05/31/2014   Emotional neurotic disorder 05/07/2014   Seborrheic keratosis 12/05/2013   Angiomyolipoma of right kidney 06/08/2013   Mass of left side of neck 10/17/2012   At high risk for falls 06/28/2012   Allergic rhinitis 06/16/2012   Mass of left breast 10/27/2011   Hearing loss 01/07/2011   Foot pain 11/25/2010   Thyroid nodule 10/16/2010   Nausea 09/22/2010   URINARY INCONTINENCE, STRESS, FEMALE 04/03/2010   HYPERLIPIDEMIA 03/25/2010   PULMONARY NODULE 12/23/2009   COPD (chronic obstructive pulmonary disease) ([place]) 12/03/2009   Tobacco abuse  counseling 11/05/2009   CVA 11/05/2009   BIPOLAR DISORDER UNSPECIFIED 11/28/2008   Hypothyroidism 05/06/2008   OCD (obsessive compulsive disorder) 10/13/2006   GERD 10/13/2006   OSTEOPOROSIS 10/13/2006    Past Surgical History:  Procedure Laterality Date   ABDOMINAL HYSTERECTOMY     BIOPSY THYROID  08/2008   Atypia and Hurtle cells, partial thyroidectomy    BLADDER SUSPENSION     mesh sling   Carotid ultrasound  09/10/09   No significant extracranial carotid artery stenosis, vertebrals are patent with antegrade flow.    CATARACT EXTRACTION Right    COLONOSCOPY  06/03/2005   Hyperplastic polyps, sigmoid diverticulosis, internal hemorroids   EYE SURGERY     laser surgery does not know which eye   FOREIGN BODY REMOVAL Right 02/21/2015   Procedure: RIGHT FOOT FOREIGN BODY REMOVAL ;  Surgeon: Marchia Bond, MD;  Location: [facility];  Service: Orthopedics;  Laterality: Right;   MASTECTOMY COMPLETE / SIMPLE W/ SENTINEL NODE BIOPSY Left 05/13/2017   MASTECTOMY W/ SENTINEL NODE BIOPSY Left 05/13/2017   Procedure: LEFT TOTAL MASTECTOMY WITH AXILLARY SENTINEL LYMPH NODE BIOPSY;  Surgeon: Excell Seltzer, MD;  Location: [facility];  Service: General;  Laterality: Left;   MRI Head  09/20/09   No acute intracranial abn.  Signal abnormality suggestive of chronic small vessel ischemia, maximal in the right subinsular white and deep gray mater.    PFT  11/2006   Obstructive pattern.  Poor response to bronchodilators.    THYROIDECTOMY, PARTIAL  08/2008   Dr Ronnald Collum   TONSILLECTOMY AND ADENOIDECTOMY  10/2004   TRANSTHORACIC ECHOCARDIOGRAM  08/11/5275   Normal systolic function.  EF 55-60%.  Mild MR, atria ok, trivial pericardial effusion   TRANSVAGINAL TAPE (TVT) REMOVAL  06/15/2010   Dr Mancel Bale, for urge incontinence     OB History     Gravida  2   Para      Term      Preterm      AB      Living  2      SAB      IAB      Ectopic      Multiple      Live Births               Family History  Problem Relation Age of Onset   Heart disease Mother    Stomach cancer Mother    Cirrhosis Sister    Hypertension Sister    Heart disease Maternal Grandmother    Heart disease Maternal Grandfather    Colon cancer Maternal Aunt    Other Neg Hx     Social History   Tobacco Use   Smoking status: Former    Packs/day: 0.75    Years: 40.00    Pack years: 30.00    Types: Cigarettes    Quit date: 11/2016    Years since quitting:  3.8   Smokeless tobacco: Never  Vaping Use   Vaping Use: Never used  Substance Use Topics   Alcohol use: No    Alcohol/week: 0.0 standard drinks   Drug use: No    Home Medications Prior to Admission medications   Medication Sig Start Date End Date Taking? Authorizing Provider  amLODipine (NORVASC) 5 MG tablet Take 1 tablet (5 mg total) by mouth daily. 07/16/20 07/16/21  Terrilee Croak, MD  aspirin 325 MG tablet Take 325 mg by mouth daily.    [provider]  bisacodyl (DULCOLAX) 10 MG suppository Place 1 suppository (10 mg total) rectally daily as needed for moderate constipation or severe constipation. 07/29/20   Pokhrel, Corrie Mckusick, MD  diclofenac Sodium (VOLTAREN) 1 % GEL Apply 2 g topically 4 (four) times daily. At the site of pain 07/29/20   Pokhrel, Corrie Mckusick, MD  escitalopram (LEXAPRO) 20 MG tablet TAKE 1 TABLET BY MOUTH EVERY DAY Patient taking differently: Take 20 mg by mouth daily. 06/23/20   Nicholas Lose, MD  feeding supplement (ENSURE ENLIVE / ENSURE PLUS) LIQD Take 237 mLs by mouth 3 (three) times daily between meals. 07/29/20   Pokhrel, Corrie Mckusick, MD  levothyroxine (SYNTHROID) 150 MCG tablet Take 1 tablet (150 mcg total) by mouth every morning. 30 minutes before food 07/29/20   Pokhrel, Corrie Mckusick, MD  Multiple Vitamin (MULTIVITAMIN WITH MINERALS) TABS tablet Take 1 tablet by mouth daily. 07/29/20   Pokhrel, Corrie Mckusick, MD  ondansetron (ZOFRAN) 4 MG tablet Take 1 tablet (4 mg total) by mouth every 8 (eight) hours as needed for  nausea or vomiting. 07/29/20   Pokhrel, Corrie Mckusick, MD  polyethylene glycol (MIRALAX / GLYCOLAX) 17 g packet Take 17 g by mouth daily. 07/29/20   Pokhrel, Corrie Mckusick, MD  promethazine (PHENERGAN) 12.5 MG tablet Take 1 tablet (12.5 mg total) by mouth every 8 (eight) hours as needed for nausea or vomiting. 01/28/20   Shary Key, DO  tamoxifen (NOLVADEX) 20 MG tablet Take 1 tablet (20 mg total) by mouth daily. 03/25/20   Nicholas Lose, MD  traZODone (DESYREL) 50 MG tablet Take 1 tablet (50 mg total) by mouth at bedtime. 07/29/20   Pokhrel, Corrie Mckusick, MD  vitamin B-12 (CYANOCOBALAMIN) 1000 MCG tablet Take 1 tablet (1,000 mcg total) by mouth daily. 07/29/20   Pokhrel, Corrie Mckusick, MD    Allergies    Seroquel [quetiapine fumarate], Amoxicillin, Ciprofloxacin, Haloperidol lactate, Ketorolac tromethamine, Latuda [lurasidone hcl], Thorazine [chlorpromazine], and Wellbutrin [bupropion]  Review of Systems   Review of Systems  Constitutional:  Negative for chills and fever.  HENT: Negative.    Respiratory: Negative.    Cardiovascular: Negative.   Gastrointestinal:  Positive for abdominal pain, nausea and vomiting.  Genitourinary: Negative.   Musculoskeletal: Negative.   Skin: Negative.   Neurological: Negative.    Physical Exam Updated Vital Signs BP 112/81   Pulse (!) 29   Temp 97.6 F (36.4 C) (Oral)   Resp (!) 22   LMP 01/09/2012   SpO2 99%   Physical Exam Vitals and nursing note reviewed.  Constitutional:      General: She is not in acute distress.    Appearance: She is well-developed. She is not ill-appearing.  HENT:     Head: Normocephalic.  Cardiovascular:     Rate and Rhythm: Normal rate and regular rhythm.     Heart sounds: No murmur heard. Pulmonary:     Effort: Pulmonary effort is normal.     Breath sounds: No wheezing, rhonchi or rales.  Abdominal:  General: There is no distension.     Palpations: Abdomen is soft.     Tenderness: There is abdominal tenderness in the left lower  quadrant.  Skin:    General: Skin is warm and dry.  Neurological:     General: No focal deficit present.     Mental Status: She is alert.    ED Results / Procedures / Treatments   Labs (all labs ordered are listed, but only abnormal results are displayed) Labs Reviewed  COMPREHENSIVE METABOLIC PANEL - Abnormal; Notable for the following components:      Result Value   Potassium 2.6 (*)    Glucose, Bld 109 (*)    Calcium 8.3 (*)    Total Protein 5.3 (*)    Albumin 2.4 (*)    AST 12 (*)    Alkaline Phosphatase 153 (*)    All other components within normal limits  LIPASE, BLOOD  CBC  URINALYSIS, ROUTINE W REFLEX MICROSCOPIC    EKG None  Radiology No results found.  Procedures Procedures   Medications Ordered in ED Medications  potassium chloride 10 mEq in 100 mL IVPB (10 mEq Intravenous New Bag/Given 09/18/20 0430)  ondansetron (ZOFRAN) injection 4 mg (4 mg Intravenous Given 09/18/20 0330)  sodium chloride 0.9 % bolus 500 mL (500 mLs Intravenous New Bag/Given 09/18/20 0429)    ED Course  I have reviewed the triage vital signs and the nursing notes.  Pertinent labs & imaging results that were available during my care of the patient were reviewed by me and considered in my medical decision making (see chart for details).    MDM Rules/Calculators/A&P                          Patient to ED with abdominal pain x 1 month, persistent nausea, limited vomiting. No melena reported. No fever. She reports having a BM relieves the pain temporarily.   Potassium is low at 2.6. IV potassium started for 3 runs 10 mEq. Will d/ch on oral. CT scan shows diffuse sclerotic bony lesions most likely osseous metastatic disease given breast CA history. Patient is aware of bone abnormalities. Thinks its osteoporoses.   There are findings c/w potential colitis present. Given presenting history of symptoms x 1 month, will treat with antibiotics. Pharmacy consulted given allergy to Cipro. Will  treat with cefinir and flagyl, which will also cover her UTI. Urine Culture pending.   She is stable for discharge back to [facility] via [facility].   Final Clinical Impression(s) / ED Diagnoses Final diagnoses:  None   Colitis UTI Hypokalemia  Rx / DC Orders ED Discharge Orders     None        Charlann Lange, PA-C 09/18/20 0720    Veryl Speak, MD 09/19/20 0131

## 2020-09-18 NOTE — ED Notes (Addendum)
[facility] at bedside to transport back to [facility]. Report given to [place].

## 2020-09-18 NOTE — ED Triage Notes (Signed)
Pt presents from [facility] via [facility], c/o LLQ abd pain x 1 mo with nausea. Denies v/d or fevers. Facility states she has not taken her meds in 3 days

## 2020-09-18 NOTE — ED Notes (Addendum)
Critical Result  Time: 0408 Test: Potassium  Value: 2.6 MD notified: Stark Jock, MD Action: Awaiting orders

## 2020-09-18 NOTE — ED Notes (Signed)
POA daughter updated, [facility] called for transport and attempted to call [facility] for report, no response.

## 2020-09-21 LAB — URINE CULTURE: Culture: >=100000 — AB

## 2020-09-22 ENCOUNTER — Telehealth: Payer: Self-pay | Admitting: Emergency Medicine

## 2020-09-22 NOTE — Telephone Encounter (Signed)
Post ED Visit - Positive Culture Follow-up  Culture report reviewed by antimicrobial stewardship pharmacist: [place] Team []  Elenor Quinones, Pharm.D. []  Heide Guile, Pharm.D., BCPS AQ-ID []  Parks Neptune, Pharm.D., BCPS []  Alycia Rossetti, Pharm.D., BCPS []  [place], [place].D., BCPS, AAHIVP []  Legrand Como, Pharm.D., BCPS, AAHIVP []  Salome Arnt, PharmD, BCPS []  Johnnette Gourd, PharmD, BCPS []  Hughes Better, PharmD, BCPS []  Leeroy Cha, PharmD []  Laqueta Linden, PharmD, BCPS []  Albertina Parr, PharmD  [place] Team []  Leodis Sias, PharmD []  Lindell Spar, PharmD []  Royetta Asal, PharmD []  Graylin Shiver, Rph []  Rema Fendt) Glennon Mac, PharmD []  Arlyn Dunning, PharmD []  Netta Cedars, PharmD []  [name], PharmD []  Leone Haven, PharmD []  Gretta Arab, PharmD []  Theodis Shove, PharmD []  Peggyann Juba, PharmD []  Reuel Boom, PharmD   Positive urine culture Treated with cefdinir, organism sensitive to the same and no further patient follow-up is required at this time.  Hazle Nordmann 09/22/2020, 9:51 AM

## 2020-09-22 NOTE — Progress Notes (Signed)
ED Antimicrobial Stewardship Positive Culture Follow Up   [name] is an 75 y.o. female who presented to [facility] on 09/18/2020 with a chief complaint of abdominal pain Chief Complaint  Patient presents with   Abdominal Pain    Recent Results (from the past 720 hour(s))  Urine culture     Status: Abnormal   Collection Time: 09/18/20  6:32 AM   Specimen: Urine, Clean Catch  Result Value Ref Range Status   Specimen Description   Final    URINE, CLEAN CATCH Performed at [facility], [place] [address], [place], [place] [address]    Special Requests   Final    NONE Performed at [facility], [place] [address] [place], [place] [address]    Culture >=100,000 COLONIES/mL ESCHERICHIA COLI (A)  Final   Report Status 09/21/2020 FINAL  Final   Organism ID, Bacteria ESCHERICHIA COLI (A)  Final      Susceptibility   Escherichia coli - MIC*    AMPICILLIN 4 SENSITIVE Sensitive     CEFAZOLIN <=4 SENSITIVE Sensitive     CEFEPIME <=0.12 SENSITIVE Sensitive     CEFTRIAXONE 1 SENSITIVE Sensitive     CIPROFLOXACIN <=0.25 SENSITIVE Sensitive     GENTAMICIN <=1 SENSITIVE Sensitive     IMIPENEM <=0.25 SENSITIVE Sensitive     NITROFURANTOIN 64 INTERMEDIATE Intermediate     TRIMETH/SULFA <=20 SENSITIVE Sensitive     AMPICILLIN/SULBACTAM <=2 SENSITIVE Sensitive     PIP/TAZO <=4 SENSITIVE Sensitive     * >=100,000 COLONIES/mL ESCHERICHIA COLI   74 YOF presenting 6/16 w/ LLQ abdominal pain x1 month. Reports chronic nausea and vomiting x1 w/ current pain. Has to strain for a bowel movement but then her pain eases off. No urinary sxs, chest pain, SOB, fever. Has not taken her meds for 3-4 days. Suspected colitis, empirically treated w/ cefdinir and metronidazole  Plan: ok  Debria Garret, Student Pharmacist 09/22/2020, 8:11 AM

## 2020-09-24 DIAGNOSIS — E039 Hypothyroidism, unspecified: Secondary | ICD-10-CM | POA: Diagnosis not present

## 2020-09-24 DIAGNOSIS — E860 Dehydration: Secondary | ICD-10-CM | POA: Diagnosis not present

## 2020-09-24 DIAGNOSIS — K5900 Constipation, unspecified: Secondary | ICD-10-CM | POA: Diagnosis not present

## 2020-09-24 DIAGNOSIS — R627 Adult failure to thrive: Secondary | ICD-10-CM | POA: Diagnosis not present

## 2020-09-30 ENCOUNTER — Telehealth: Payer: Self-pay | Admitting: *Deleted

## 2020-09-30 NOTE — Telephone Encounter (Signed)
Received call from pt daughter Tammi requesting office visit for pt.  States pt is currently at [facility] and rehabilitation center after a recent hospital admission. Requesting visit to be 10/13/20 so she can accompany pt during visit.  Apt scheduled and Tammi states she will alert [facility] and arrange for transportation.

## 2020-10-02 DIAGNOSIS — R109 Unspecified abdominal pain: Secondary | ICD-10-CM | POA: Diagnosis not present

## 2020-10-02 DIAGNOSIS — G301 Alzheimer's disease with late onset: Secondary | ICD-10-CM | POA: Diagnosis not present

## 2020-10-02 DIAGNOSIS — I10 Essential (primary) hypertension: Secondary | ICD-10-CM | POA: Diagnosis not present

## 2020-10-02 DIAGNOSIS — R627 Adult failure to thrive: Secondary | ICD-10-CM | POA: Diagnosis not present

## 2020-10-03 DIAGNOSIS — R109 Unspecified abdominal pain: Secondary | ICD-10-CM | POA: Diagnosis not present

## 2020-10-10 ENCOUNTER — Other Ambulatory Visit: Payer: Self-pay

## 2020-10-10 ENCOUNTER — Non-Acute Institutional Stay: Payer: Medicare Other | Admitting: Nurse Practitioner

## 2020-10-10 VITALS — BP 138/70 | HR 96 | Resp 16 | Ht 59.0 in | Wt 95.0 lb

## 2020-10-10 DIAGNOSIS — Z515 Encounter for palliative care: Secondary | ICD-10-CM | POA: Diagnosis not present

## 2020-10-10 DIAGNOSIS — R1030 Lower abdominal pain, unspecified: Secondary | ICD-10-CM

## 2020-10-10 DIAGNOSIS — R638 Other symptoms and signs concerning food and fluid intake: Secondary | ICD-10-CM

## 2020-10-10 DIAGNOSIS — F321 Major depressive disorder, single episode, moderate: Secondary | ICD-10-CM

## 2020-10-10 DIAGNOSIS — R634 Abnormal weight loss: Secondary | ICD-10-CM

## 2020-10-10 NOTE — Progress Notes (Signed)
[facility] Pall[facility] Consult Note Telephone: [phone]  Fax: [phone]    Date of encounter: 10/10/20 PATIENT NAME: [name] [id number]   [phone] (home)  DOB: [birth year] MRN: [id number]  PRIMARY CARE PROVIDER:    Chrystie Nose,  [place] [address] [phone]  REFERRING PROVIDER:   Shary Key, DO [address] [place],  [place] [address] [phone]  RESPONSIBLE PARTY:    Contact Information     Name Relation Home Work [place] Daughter [phone]  [phone]   [name] Sister   [phone]   Shelbie Proctor [phone]       I met face to face with patient in facility.  Palliative Care was asked to follow this patient by consultation request of  Shary Key, DO to address advance care planning and complex medical decision making. This is a follow up visit.                                 ASSESSMENT AND PLAN / RECOMMENDATIONS:   Advance Care Planning/Goals of Care: Goals of care of palliative care include to maximize quality of life and symptom management. Our advance care planning conversation included a discussion about:    The value and importance of advance care planning  Experiences with loved ones who have been seriously ill or have died  Exploration of personal, cultural or spiritual beliefs that might influence medical decisions  Exploration of goals of care in the event of a sudden injury or illness  CODE STATUS: DNR Goal of care: Patient's goal of care os comfort. Patient verbalized desire to be comfortable saying she is tired of being in pain and depending on people for her day to day care. Patient verbalized decision to not pursue aggressive treatment for her symptoms. Directives: Signed DNR and MOST forms present on file in the facility, copy on [facility] EMR. Details of MOST form include limited  additional interventions, antibiotics if indicated, IV fluid if indicated, no feeding tube. Patient reiterated desire to not be resuscitated in the event of cardiac or respiratory arrest. Validation provided. Palliative care will continue to provide support to patient, family and medical team.  I spent 25 minutes providing this consultation. More than 50% of the time in this consultation was spent in counseling and care coordination. ---------------------------------------------------------------------------------------  Symptom Management/Plan: Poor oral intake: Patient was on Marinol and Mirtazapine,. Mirtazapine discontinued due to patient's complaints of it causing nausea. Recommend aggressive management of nausea. Consider scheduling Zofran round the clock to ensure patient receives dose. Recommend as needed dose for break through nausea. Continue to offer meals and snacks, encourage adequate oral fluid and meal intake.  Abnormal weight loss: Had 11% weight loss in the last 2 months. Weight loss related to poor oral intake. Recommend  above plan. Abdominal pain- Report 10/10 pain, continues to refuse pain medication. KUB completed 2 days ago was unremarkable. Patient with suspicious findings of possible bone metastasis on CT of abdomen and pelvis completed on 09/18/2020. Patient has a follow up appointment with Oncology on 10/14/2020. Continue to offer pain medication and encourage med compliance. Depression: Patient denied being depressed. She is followed by facility's mental health provider. Last visit was on 09/16/2020 with no change in therapy. Provided general support and encouragement. Questions and concerns were addressed.  I  called patient's daughter with visit update. She verbalized desire to purse further diagnostics to investigate cause of patient's abdominal pain. Wants to discuss next plan for patient after talking with oncology. Validation provided.  Follow up Palliative Care Visit:  Palliative care will continue to follow for complex medical decision making, advance care planning, and clarification of goals. Return in about 2-4 weeks or prn.  PPS: 20%  HOSPICE ELIGIBILITY/DIAGNOSIS: TBD  CHIEF COMPLAIN: Poor oral intake  History obtained from review of Epic EMR, discussion with facility staff and interview with [name] and over the phone with daughter [name]  HISTORY OF PRESENT ILLNESS:  [name] is a 75 y.o. year old female with  Alzheimer's disease, breast CA on tamoxifen s/p left masectomy, COPD, depression, hypothyroidism, TSH, HTN.  Patient seen today for reports of poor oral intake. Condition is aggravated by constant nausea. Staff report patient with < 25% meal intake in the last month and none in the last 3 days. Staff report patient refusing meals and medication and would take Nausea medication Zofran.  Patient awake and alert at the time of the visit, noted lying in bed in NAD. She endorsed 10/10 lower abdominal pain, reiterated desire for no pain medication. Report pain is ongoing and aggravated by movement relieved when she pulls her knees up in bed. Report all oral intake except water makes her nauseous. KUB completed in the facility 2 days ago was unremarkable. She denied fever, denied chills, denied chest pain, denied increased SOB, denied nausea or vomiting. No report of constipation or diarrhea. She is totally dependent on staff for all of her ADLs and non -mobile.   CBC Latest Ref Rng & Units 09/18/2020 07/27/2020 07/24/2020  WBC 4.0 - 10.5 K/uL 7.9 4.9 5.9  Hemoglobin 12.0 - 15.0 g/dL 13.1 9.9(L) 10.2(L)  Hematocrit 36.0 - 46.0 % 39.7 31.1(L) 32.0(L)  Platelets 150 - 400 K/uL 302 338 331   CMP     Component Value Date/Time   NA 137 09/18/2020 0330   NA 141 04/23/2020 1003   NA 144 03/23/2017 1204   K 2.6 (LL) 09/18/2020 0330   K 3.0 (LL) 03/23/2017 1204   CL 101 09/18/2020 0330   CO2 25 09/18/2020 0330   CO2 28 03/23/2017 1204   GLUCOSE 109  (H) 09/18/2020 0330   GLUCOSE 136 03/23/2017 1204   BUN 15 09/18/2020 0330   BUN 12 04/23/2020 1003   BUN 15.2 03/23/2017 1204   CREATININE 0.57 09/18/2020 0330   CREATININE 0.9 03/23/2017 1204   CALCIUM 8.3 (L) 09/18/2020 0330   CALCIUM 9.2 03/23/2017 1204   PROT 5.3 (L) 09/18/2020 0330   PROT 6.2 (L) 03/23/2017 1204   ALBUMIN 2.4 (L) 09/18/2020 0330   ALBUMIN 3.7 03/23/2017 1204   AST 12 (L) 09/18/2020 0330   AST 13 03/23/2017 1204   ALT 9 09/18/2020 0330   ALT 8 03/23/2017 1204   ALKPHOS 153 (H) 09/18/2020 0330   ALKPHOS 97 03/23/2017 1204   BILITOT 0.8 09/18/2020 0330   BILITOT 0.39 03/23/2017 1204   GFRNONAA >60 09/18/2020 0330   GFRNONAA 82 05/17/2013 1701   GFRAA 72 04/23/2020 1003   GFRAA >89 05/17/2013 1701      Reviewed CT scan from 09/18/2020 IMPRESSION: 1. Diffusely sclerotic skeleton is new since a 2017 CT Abdomen and Pelvis and most likely due to diffuse osseous metastatic disease given history of breast cancer.   2. Abnormal heterogeneous, hyperdense material layering within the urinary bladder. This is nonspecific.  Query gross hematuria. No superimposed obstructive uropathy, although there are small renal vascular calcifications versus nephrolithiasis. Recommend further evaluation, Urology consultation if needed.   3. Difficult to exclude a Mild Acute Colitis on this noncontrast exam. But no free fluid, bowel obstruction, or other complicating features.   4. No other acute or inflammatory process identified in the noncontrast abdomen or pelvis. Benign left adrenal adenoma. Aortic Atherosclerosis (ICD10-I70.0).  I reviewed available labs, medications, imaging, studies and related documents from the EMR.  Records reviewed and summarized above.   Vitals with BMI 10/10/2020 09/18/2020 09/18/2020  Height 4' 11"  - -  Weight 95 lbs - -  BMI 82.08 - -  Systolic 138 - 871  Diastolic 70 - 86  Pulse 96 99 99    Physical Exam: Current and past weights: 95.4lb in  June 2022 down from 106.8lb in May 2022, Ht 4 11", BMI 19.2kg/m2 General: chronically ill and frail appearing, cachetic, lying in bed in NAD EYES: anicteric sclera, no discharge  ENMT: intact hearing, oral mucous membranes dry  CV:  RRR, no LE edema Pulmonary: LCTA, no increased work of breathing, no cough, room air Abdomen: hypo-active BS + 4 quadrants, soft and tender on deep palpation, no ascites GU: deferred MSK: no sarcopenia, moves all extremities, ambulatory Skin: warm and dry, no rashes or wounds on visible skin Neuro: generalized weakness, mild cognitive impairment Psych: non-anxious affect, A and O x 3 Hem/lymph/immuno: no widespread bruising  Past Medical History:  Diagnosis Date   Anxiety    Arthritis    Cancer (HCC)    breast cancer   COPD (chronic obstructive pulmonary disease) (HCC)    Due to long history of tobacco abuse, still smoking   Depression    Diverticulosis of colon    GERD (gastroesophageal reflux disease)    Heart murmur    not sure never been evaluated   History of uterine prolapse 10/2004   Dr Cletis Media   Hypertension    not on any medications   Hypothyroidism    OCD (obsessive compulsive disorder)    Osteoporosis    PUD (peptic ulcer disease) 09/2006   H pylori   Thyroid disease    Ho R thyroid noduel plus mild hyperthyroidism. Treated with radioiodine therapty on 04/2006. Dr Estrella Myrtle.    Thank you for the opportunity to participate in the care of [name].  The palliative care team will continue to follow. Please call our office at [phone] if we can be of additional assistance.   Jari Favre, DNP, AGPCNP-BC  COVID-19 PATIENT SCREENING TOOL Asked and negative response unless otherwise noted:   Have you had symptoms of covid, tested positive or been in contact with someone with symptoms/positive test in the past 5-10 days?

## 2020-10-13 ENCOUNTER — Ambulatory Visit: Payer: Medicare Other | Admitting: Hematology and Oncology

## 2020-10-13 NOTE — Progress Notes (Signed)
Patient Care Team: Cora Collum, DO as PCP - General (Family Medicine) Magrinat, Valentino Hue, MD as Consulting Physician (Oncology) Lonie Peak, MD as Attending Physician (Radiation Oncology) Harriette Bouillon, MD as Consulting Physician (General Surgery)  DIAGNOSIS:    ICD-10-CM   1. Malignant neoplasm of overlapping sites of left breast in female, estrogen receptor positive (HCC)  C50.812    Z17.0       SUMMARY OF ONCOLOGIC HISTORY: Oncology History  Malignant neoplasm of overlapping sites of left breast in female, estrogen receptor positive (HCC)  03/04/2017 Initial Diagnosis   Palpable left breast masses with skin thickening, by ultrasound 2 masses were noted at 5:00 2.1 cm and at 1:00 3.5 cm, biopsy of both of these lesions grade 2 invasive ductal carcinoma with lymphovascular invasion and perineural invasion, ER 100%, PR 0%, HER-2 negative ratio 1.03, Ki-67 5%, T2 N0 stage II a clinical stage    05/13/2017 Surgery   Left simple mastectomy: IDC grade 2, 7.7 cm, DCIS intermediate grade, lymphovascular invasion identified, perineural invasion present, margins negative, 1/2 lymph nodes positive, ER 6200% strong, PR 0%, HER-2 negative, Ki-67 5%, T3N1 a stage IIIa     Radiation Therapy   declined   05/24/2017 -  Anti-estrogen oral therapy   Tamoxifen 20mg  daily     CHIEF COMPLIANT: Follow-up of left breast cancer  INTERVAL HISTORY: [name] is a 75 y.o. with above-mentioned history of left breast cancer having undergone a left mastectomy and radiation therapy, currently on antiestrogen therapy with tamoxifen.  She has had multiple hospitalizations and referrals to rehabilitation.  She has profound failure to thrive and difficulty with eating anything.  She continuously has nausea issues and usually Zofran appears to be helping somewhat.  She had a CT scan of her abdomen and pelvis which showed diffuse bone metastases.  She has been transferred to [facility] nursing home for  rehabilitation but the patient daughter does not think she is appropriate for that.  She does not eat or drink anything she cannot take any medications.  She has diffuse bone pain all over.  ALLERGIES:  is allergic to seroquel [quetiapine fumarate], amoxicillin, ciprofloxacin, haloperidol lactate, ketorolac tromethamine, latuda [lurasidone hcl], thorazine [chlorpromazine], and wellbutrin [bupropion].  MEDICATIONS:  Current Outpatient Medications  Medication Sig Dispense Refill   bisacodyl (DULCOLAX) 5 MG EC tablet Take 10 mg by mouth daily as needed for moderate constipation.     feeding supplement (ENSURE ENLIVE / ENSURE PLUS) LIQD Take 237 mLs by mouth 3 (three) times daily between meals.     ondansetron (ZOFRAN) 4 MG tablet Take 1 tablet (4 mg total) by mouth every 8 (eight) hours as needed for nausea or vomiting.  0   polyethylene glycol (MIRALAX / GLYCOLAX) 17 g packet Take 17 g by mouth daily. 14 each 0   No current facility-administered medications for this visit.   Facility-Administered Medications Ordered in Other Visits  Medication Dose Route Frequency Provider Last Rate Last Admin   ondansetron (ZOFRAN) tablet 8 mg  8 mg Oral Once [facility], MD        PHYSICAL EXAMINATION: ECOG PERFORMANCE STATUS: 1 - Symptomatic but completely ambulatory  Vitals:   10/14/20 1324  BP: (!) 65/45  Pulse: (!) 136  Resp: (!) 26  Temp: (!) 96 F (35.6 C)  SpO2: 95%   Filed Weights     LABORATORY DATA:  I have reviewed the data as listed CMP Latest Ref Rng & Units 09/18/2020 07/28/2020 07/27/2020  Glucose 70 - 99 mg/dL 109(H) 101(H) 114(H)  BUN 8 - 23 mg/dL 15 9 <5(L)  Creatinine 0.44 - 1.00 mg/dL 0.57 0.64 0.62  Sodium 135 - 145 mmol/L 137 140 143  Potassium 3.5 - 5.1 mmol/L 2.6(LL) 4.3 2.7(LL)  Chloride 98 - 111 mmol/L 101 102 103  CO2 22 - 32 mmol/L 25 32 32  Calcium 8.9 - 10.3 mg/dL 8.3(L) 8.6(L) 8.0(L)  Total Protein 6.5 - 8.1 g/dL 5.3(L) - -  Total Bilirubin 0.3 - 1.2 mg/dL  0.8 - -  Alkaline Phos 38 - 126 U/L 153(H) - -  AST 15 - 41 U/L 12(L) - -  ALT 0 - 44 U/L 9 - -    Lab Results  Component Value Date   WBC 7.9 09/18/2020   HGB 13.1 09/18/2020   HCT 39.7 09/18/2020   MCV 96.1 09/18/2020   PLT 302 09/18/2020   NEUTROABS 6.0 07/19/2020    ASSESSMENT & PLAN:  Malignant neoplasm of overlapping sites of left breast in female, estrogen receptor positive ([facility]) 05/13/2017: Left simple mastectomy: IDC grade 2, 7.7 cm, DCIS intermediate grade, lymphovascular invasion identified, perineural invasion present, margins negative, 1/2 lymph nodes positive, ER 6200% strong, PR 0%, HER-2 negative, Ki-67 5%, T3N1 a stage IIIa   MRI brain 07/11/2020: Benign CT CAP 09/18/2020: Diffuse sclerotic lesions suggestive of bone metastases.  Multiple hospitalizations and failure to thrive with abdominal pain nausea and vomiting and lack of oral intake Profound decline in performance status with severe hypotension: I discussed with the patient and her daughter that unfortunately she is dying and that not much can be done to improve her health condition  We will request hospice to assume her care at their inpatient hospice facility if beds were available. Patient's daughter does not want her to stay in [facility] at any cost because of multiple reasons. Patient understands the criticalness of her condition.  We discussed DNR and I signed the order based on her wishes.    No orders of the defined types were placed in this encounter.  The patient has a good understanding of the overall plan. she agrees with it. she will call with any problems that may develop before the next visit here.  Total time spent: 20 mins including face to face time and time spent for planning, charting and coordination of care  Rulon Eisenmenger, MD, MPH 10/14/2020  I, Thana Ates, am acting as scribe for Dr. Nicholas Lose.  I have reviewed the above documentation for accuracy and completeness, and I  agree with the above.

## 2020-10-14 ENCOUNTER — Encounter: Payer: Self-pay | Admitting: Nurse Practitioner

## 2020-10-14 ENCOUNTER — Other Ambulatory Visit: Payer: Self-pay

## 2020-10-14 ENCOUNTER — Other Ambulatory Visit: Payer: Self-pay | Admitting: *Deleted

## 2020-10-14 ENCOUNTER — Telehealth: Payer: Self-pay | Admitting: *Deleted

## 2020-10-14 ENCOUNTER — Inpatient Hospital Stay: Payer: Medicare Other | Attending: Hematology and Oncology | Admitting: Hematology and Oncology

## 2020-10-14 DIAGNOSIS — R112 Nausea with vomiting, unspecified: Secondary | ICD-10-CM | POA: Insufficient documentation

## 2020-10-14 DIAGNOSIS — Z9012 Acquired absence of left breast and nipple: Secondary | ICD-10-CM | POA: Insufficient documentation

## 2020-10-14 DIAGNOSIS — Z880 Allergy status to penicillin: Secondary | ICD-10-CM | POA: Insufficient documentation

## 2020-10-14 DIAGNOSIS — R627 Adult failure to thrive: Secondary | ICD-10-CM | POA: Diagnosis not present

## 2020-10-14 DIAGNOSIS — Z881 Allergy status to other antibiotic agents status: Secondary | ICD-10-CM | POA: Insufficient documentation

## 2020-10-14 DIAGNOSIS — Z170 Estrogen receptor positive status [ER+]: Secondary | ICD-10-CM

## 2020-10-14 DIAGNOSIS — Z79899 Other long term (current) drug therapy: Secondary | ICD-10-CM | POA: Insufficient documentation

## 2020-10-14 DIAGNOSIS — F319 Bipolar disorder, unspecified: Secondary | ICD-10-CM | POA: Diagnosis not present

## 2020-10-14 DIAGNOSIS — C7951 Secondary malignant neoplasm of bone: Secondary | ICD-10-CM | POA: Insufficient documentation

## 2020-10-14 DIAGNOSIS — C50812 Malignant neoplasm of overlapping sites of left female breast: Secondary | ICD-10-CM

## 2020-10-14 DIAGNOSIS — I959 Hypotension, unspecified: Secondary | ICD-10-CM | POA: Insufficient documentation

## 2020-10-14 DIAGNOSIS — R109 Unspecified abdominal pain: Secondary | ICD-10-CM | POA: Diagnosis not present

## 2020-10-14 DIAGNOSIS — G4701 Insomnia due to medical condition: Secondary | ICD-10-CM | POA: Diagnosis not present

## 2020-10-14 DIAGNOSIS — F0150 Vascular dementia without behavioral disturbance: Secondary | ICD-10-CM | POA: Diagnosis not present

## 2020-10-14 MED ORDER — ONDANSETRON HCL 8 MG PO TABS
ORAL_TABLET | ORAL | Status: AC
  Filled 2020-10-14: qty 1

## 2020-10-14 MED ORDER — ONDANSETRON HCL 8 MG PO TABS: 8.0000 mg | ORAL_TABLET | Freq: Once | ORAL | Status: DC

## 2020-10-14 NOTE — Telephone Encounter (Signed)
LM for [facility], Marzetta Board that patient needs to be transferred from [facility] to [facility].  Called back and spoke with Referral coordinator.

## 2020-10-14 NOTE — Assessment & Plan Note (Signed)
05/13/2017:Left simple mastectomy: IDC grade 2, 7.7 cm, DCIS intermediate grade, lymphovascular invasion identified, perineural invasion present, margins negative, 1/2 lymph nodes positive, ER 6200% strong, PR 0%, HER-2 negative, Ki-67 5%, T3N1 a stage IIIa Has major depression: On Lexapro  Sleeps all day and wakes up all night. Severe fatigue limiting all activities Did not respond to multiple calls Profound weight loss.  Taking Tamoxifen: No Adverse effects Has no friends or family to help her even to get for the mammogram.  MRI brain 07/11/2020: Benign CT CAP 09/18/2020: Diffuse sclerotic lesions suggestive of bone metastases.  Treatment plan: I recommended adding CDK 4 and 6 inhibitors to her treatment.  However given her lack of family support it will be difficult to monitor her labs and toxicities.  Therefore I recommended switching her to letrozole and monitoring her disease. 

## 2020-10-15 DIAGNOSIS — K219 Gastro-esophageal reflux disease without esophagitis: Secondary | ICD-10-CM | POA: Diagnosis not present

## 2020-10-15 DIAGNOSIS — M810 Age-related osteoporosis without current pathological fracture: Secondary | ICD-10-CM | POA: Diagnosis not present

## 2020-10-15 DIAGNOSIS — F319 Bipolar disorder, unspecified: Secondary | ICD-10-CM | POA: Diagnosis not present

## 2020-10-15 DIAGNOSIS — I959 Hypotension, unspecified: Secondary | ICD-10-CM | POA: Diagnosis not present

## 2020-10-15 DIAGNOSIS — F419 Anxiety disorder, unspecified: Secondary | ICD-10-CM | POA: Diagnosis not present

## 2020-10-15 DIAGNOSIS — J449 Chronic obstructive pulmonary disease, unspecified: Secondary | ICD-10-CM | POA: Diagnosis not present

## 2020-10-15 DIAGNOSIS — G893 Neoplasm related pain (acute) (chronic): Secondary | ICD-10-CM | POA: Diagnosis not present

## 2020-10-15 DIAGNOSIS — C50812 Malignant neoplasm of overlapping sites of left female breast: Secondary | ICD-10-CM | POA: Diagnosis not present

## 2020-10-15 DIAGNOSIS — K5790 Diverticulosis of intestine, part unspecified, without perforation or abscess without bleeding: Secondary | ICD-10-CM | POA: Diagnosis not present

## 2020-10-15 DIAGNOSIS — Z87891 Personal history of nicotine dependence: Secondary | ICD-10-CM | POA: Diagnosis not present

## 2020-10-15 DIAGNOSIS — Z170 Estrogen receptor positive status [ER+]: Secondary | ICD-10-CM | POA: Diagnosis not present

## 2020-10-15 DIAGNOSIS — F429 Obsessive-compulsive disorder, unspecified: Secondary | ICD-10-CM | POA: Diagnosis not present

## 2020-10-15 DIAGNOSIS — E43 Unspecified severe protein-calorie malnutrition: Secondary | ICD-10-CM | POA: Diagnosis not present

## 2020-10-15 DIAGNOSIS — Z8639 Personal history of other endocrine, nutritional and metabolic disease: Secondary | ICD-10-CM | POA: Diagnosis not present

## 2020-10-15 DIAGNOSIS — M1990 Unspecified osteoarthritis, unspecified site: Secondary | ICD-10-CM | POA: Diagnosis not present

## 2020-10-15 DIAGNOSIS — R627 Adult failure to thrive: Secondary | ICD-10-CM | POA: Diagnosis not present

## 2020-10-15 DIAGNOSIS — N814 Uterovaginal prolapse, unspecified: Secondary | ICD-10-CM | POA: Diagnosis not present

## 2020-10-15 DIAGNOSIS — Z8719 Personal history of other diseases of the digestive system: Secondary | ICD-10-CM | POA: Diagnosis not present

## 2020-10-15 DIAGNOSIS — E039 Hypothyroidism, unspecified: Secondary | ICD-10-CM | POA: Diagnosis not present

## 2020-10-15 DIAGNOSIS — Z8679 Personal history of other diseases of the circulatory system: Secondary | ICD-10-CM | POA: Diagnosis not present

## 2020-10-15 DIAGNOSIS — C7951 Secondary malignant neoplasm of bone: Secondary | ICD-10-CM | POA: Diagnosis not present

## 2020-10-16 DIAGNOSIS — C50812 Malignant neoplasm of overlapping sites of left female breast: Secondary | ICD-10-CM | POA: Diagnosis not present

## 2020-10-16 DIAGNOSIS — Z170 Estrogen receptor positive status [ER+]: Secondary | ICD-10-CM | POA: Diagnosis not present

## 2020-10-16 DIAGNOSIS — G893 Neoplasm related pain (acute) (chronic): Secondary | ICD-10-CM | POA: Diagnosis not present

## 2020-10-16 DIAGNOSIS — R627 Adult failure to thrive: Secondary | ICD-10-CM | POA: Diagnosis not present

## 2020-10-16 DIAGNOSIS — E43 Unspecified severe protein-calorie malnutrition: Secondary | ICD-10-CM | POA: Diagnosis not present

## 2020-10-16 DIAGNOSIS — C7951 Secondary malignant neoplasm of bone: Secondary | ICD-10-CM | POA: Diagnosis not present

## 2020-10-20 DIAGNOSIS — C7951 Secondary malignant neoplasm of bone: Secondary | ICD-10-CM | POA: Diagnosis not present

## 2020-10-20 DIAGNOSIS — C50812 Malignant neoplasm of overlapping sites of left female breast: Secondary | ICD-10-CM | POA: Diagnosis not present

## 2020-10-20 DIAGNOSIS — R627 Adult failure to thrive: Secondary | ICD-10-CM | POA: Diagnosis not present

## 2020-10-20 DIAGNOSIS — G893 Neoplasm related pain (acute) (chronic): Secondary | ICD-10-CM | POA: Diagnosis not present

## 2020-10-20 DIAGNOSIS — E43 Unspecified severe protein-calorie malnutrition: Secondary | ICD-10-CM | POA: Diagnosis not present

## 2020-10-20 DIAGNOSIS — Z170 Estrogen receptor positive status [ER+]: Secondary | ICD-10-CM | POA: Diagnosis not present

## 2020-10-21 DIAGNOSIS — E43 Unspecified severe protein-calorie malnutrition: Secondary | ICD-10-CM | POA: Diagnosis not present

## 2020-10-21 DIAGNOSIS — G893 Neoplasm related pain (acute) (chronic): Secondary | ICD-10-CM | POA: Diagnosis not present

## 2020-10-21 DIAGNOSIS — C7951 Secondary malignant neoplasm of bone: Secondary | ICD-10-CM | POA: Diagnosis not present

## 2020-10-21 DIAGNOSIS — Z170 Estrogen receptor positive status [ER+]: Secondary | ICD-10-CM | POA: Diagnosis not present

## 2020-10-21 DIAGNOSIS — C50812 Malignant neoplasm of overlapping sites of left female breast: Secondary | ICD-10-CM | POA: Diagnosis not present

## 2020-10-21 DIAGNOSIS — R627 Adult failure to thrive: Secondary | ICD-10-CM | POA: Diagnosis not present

## 2020-10-22 DIAGNOSIS — G893 Neoplasm related pain (acute) (chronic): Secondary | ICD-10-CM | POA: Diagnosis not present

## 2020-10-22 DIAGNOSIS — R627 Adult failure to thrive: Secondary | ICD-10-CM | POA: Diagnosis not present

## 2020-10-22 DIAGNOSIS — E43 Unspecified severe protein-calorie malnutrition: Secondary | ICD-10-CM | POA: Diagnosis not present

## 2020-10-22 DIAGNOSIS — C50812 Malignant neoplasm of overlapping sites of left female breast: Secondary | ICD-10-CM | POA: Diagnosis not present

## 2020-10-22 DIAGNOSIS — C7951 Secondary malignant neoplasm of bone: Secondary | ICD-10-CM | POA: Diagnosis not present

## 2020-10-22 DIAGNOSIS — Z170 Estrogen receptor positive status [ER+]: Secondary | ICD-10-CM | POA: Diagnosis not present

## 2020-10-24 DIAGNOSIS — Z170 Estrogen receptor positive status [ER+]: Secondary | ICD-10-CM | POA: Diagnosis not present

## 2020-10-24 DIAGNOSIS — G893 Neoplasm related pain (acute) (chronic): Secondary | ICD-10-CM | POA: Diagnosis not present

## 2020-10-24 DIAGNOSIS — C50812 Malignant neoplasm of overlapping sites of left female breast: Secondary | ICD-10-CM | POA: Diagnosis not present

## 2020-10-24 DIAGNOSIS — R627 Adult failure to thrive: Secondary | ICD-10-CM | POA: Diagnosis not present

## 2020-10-24 DIAGNOSIS — C7951 Secondary malignant neoplasm of bone: Secondary | ICD-10-CM | POA: Diagnosis not present

## 2020-10-24 DIAGNOSIS — E43 Unspecified severe protein-calorie malnutrition: Secondary | ICD-10-CM | POA: Diagnosis not present

## 2020-10-25 DIAGNOSIS — C7951 Secondary malignant neoplasm of bone: Secondary | ICD-10-CM | POA: Diagnosis not present

## 2020-10-25 DIAGNOSIS — Z7401 Bed confinement status: Secondary | ICD-10-CM | POA: Diagnosis not present

## 2020-10-25 DIAGNOSIS — Z170 Estrogen receptor positive status [ER+]: Secondary | ICD-10-CM | POA: Diagnosis not present

## 2020-10-25 DIAGNOSIS — R0902 Hypoxemia: Secondary | ICD-10-CM | POA: Diagnosis not present

## 2020-10-25 DIAGNOSIS — G893 Neoplasm related pain (acute) (chronic): Secondary | ICD-10-CM | POA: Diagnosis not present

## 2020-10-25 DIAGNOSIS — R4182 Altered mental status, unspecified: Secondary | ICD-10-CM | POA: Diagnosis not present

## 2020-10-25 DIAGNOSIS — C50812 Malignant neoplasm of overlapping sites of left female breast: Secondary | ICD-10-CM | POA: Diagnosis not present

## 2020-10-25 DIAGNOSIS — R627 Adult failure to thrive: Secondary | ICD-10-CM | POA: Diagnosis not present

## 2020-10-25 DIAGNOSIS — R531 Weakness: Secondary | ICD-10-CM | POA: Diagnosis not present

## 2020-10-25 DIAGNOSIS — E43 Unspecified severe protein-calorie malnutrition: Secondary | ICD-10-CM | POA: Diagnosis not present

## 2020-10-25 DIAGNOSIS — I959 Hypotension, unspecified: Secondary | ICD-10-CM | POA: Diagnosis not present

## 2020-11-03 DEATH — deceased

## 2021-03-25 ENCOUNTER — Ambulatory Visit: Payer: Medicare Other | Admitting: Hematology and Oncology
